# Patient Record
Sex: Male | Born: 1960 | Race: Black or African American | Hispanic: No | Marital: Single | Smoking: Former smoker
Health system: Southern US, Community
[De-identification: ages and names within clinical notes are randomized; demographics above are authoritative.]

## PROBLEM LIST (undated history)

## (undated) DIAGNOSIS — C801 Malignant (primary) neoplasm, unspecified: Secondary | ICD-10-CM

## (undated) DIAGNOSIS — I10 Essential (primary) hypertension: Secondary | ICD-10-CM

## (undated) DIAGNOSIS — E119 Type 2 diabetes mellitus without complications: Secondary | ICD-10-CM

## (undated) DIAGNOSIS — C787 Secondary malignant neoplasm of liver and intrahepatic bile duct: Principal | ICD-10-CM

## (undated) DIAGNOSIS — C189 Malignant neoplasm of colon, unspecified: Secondary | ICD-10-CM

## (undated) DIAGNOSIS — M6281 Muscle weakness (generalized): Secondary | ICD-10-CM

## (undated) HISTORY — DX: Muscle weakness (generalized): M62.81

## (undated) HISTORY — PX: CHOLECYSTECTOMY: SHX55

## (undated) HISTORY — DX: Secondary malignant neoplasm of liver and intrahepatic bile duct: C78.7

## (undated) HISTORY — PX: COLON SURGERY: SHX602

## (undated) HISTORY — PX: LIVER SURGERY: SHX698

## (undated) HISTORY — DX: Malignant neoplasm of colon, unspecified: C18.9

---

## 2000-01-02 ENCOUNTER — Inpatient Hospital Stay (HOSPITAL_COMMUNITY): Admission: RE | Admit: 2000-01-02 | Discharge: 2000-01-10 | Payer: Self-pay | Admitting: Surgery

## 2000-01-02 ENCOUNTER — Emergency Department (HOSPITAL_COMMUNITY): Admission: EM | Admit: 2000-01-02 | Discharge: 2000-01-02 | Payer: Self-pay | Admitting: Emergency Medicine

## 2000-01-04 ENCOUNTER — Encounter: Payer: Self-pay | Admitting: Surgery

## 2000-01-05 ENCOUNTER — Encounter: Payer: Self-pay | Admitting: Surgery

## 2000-01-09 ENCOUNTER — Encounter: Payer: Self-pay | Admitting: Surgery

## 2000-01-16 ENCOUNTER — Encounter: Payer: Self-pay | Admitting: Surgery

## 2000-01-16 ENCOUNTER — Ambulatory Visit (HOSPITAL_COMMUNITY): Admission: RE | Admit: 2000-01-16 | Discharge: 2000-01-16 | Payer: Self-pay | Admitting: Surgery

## 2000-01-30 ENCOUNTER — Ambulatory Visit (HOSPITAL_COMMUNITY): Admission: RE | Admit: 2000-01-30 | Discharge: 2000-01-30 | Payer: Self-pay | Admitting: Surgery

## 2000-01-30 ENCOUNTER — Encounter: Payer: Self-pay | Admitting: Surgery

## 2000-02-06 ENCOUNTER — Encounter: Payer: Self-pay | Admitting: Urology

## 2000-02-08 ENCOUNTER — Inpatient Hospital Stay (HOSPITAL_COMMUNITY): Admission: RE | Admit: 2000-02-08 | Discharge: 2000-02-23 | Payer: Self-pay | Admitting: Surgery

## 2000-02-08 ENCOUNTER — Encounter (INDEPENDENT_AMBULATORY_CARE_PROVIDER_SITE_OTHER): Payer: Self-pay | Admitting: Specialist

## 2000-02-08 ENCOUNTER — Encounter: Payer: Self-pay | Admitting: Surgery

## 2000-02-09 ENCOUNTER — Encounter: Payer: Self-pay | Admitting: Surgery

## 2000-02-13 ENCOUNTER — Encounter: Payer: Self-pay | Admitting: Surgery

## 2000-02-14 ENCOUNTER — Encounter: Payer: Self-pay | Admitting: Surgery

## 2000-02-15 ENCOUNTER — Encounter: Payer: Self-pay | Admitting: Surgery

## 2000-02-20 ENCOUNTER — Encounter: Payer: Self-pay | Admitting: Surgery

## 2000-02-22 ENCOUNTER — Encounter: Payer: Self-pay | Admitting: Oncology

## 2000-03-26 ENCOUNTER — Encounter (INDEPENDENT_AMBULATORY_CARE_PROVIDER_SITE_OTHER): Payer: Self-pay

## 2000-03-26 ENCOUNTER — Encounter: Payer: Self-pay | Admitting: Oncology

## 2000-03-26 ENCOUNTER — Ambulatory Visit (HOSPITAL_COMMUNITY): Admission: RE | Admit: 2000-03-26 | Discharge: 2000-03-26 | Payer: Self-pay | Admitting: Oncology

## 2000-05-25 ENCOUNTER — Ambulatory Visit (HOSPITAL_COMMUNITY): Admission: RE | Admit: 2000-05-25 | Discharge: 2000-05-25 | Payer: Self-pay | Admitting: Oncology

## 2000-07-19 ENCOUNTER — Encounter: Payer: Self-pay | Admitting: Oncology

## 2000-07-19 ENCOUNTER — Ambulatory Visit (HOSPITAL_COMMUNITY): Admission: RE | Admit: 2000-07-19 | Discharge: 2000-07-19 | Payer: Self-pay | Admitting: Oncology

## 2000-09-12 ENCOUNTER — Ambulatory Visit (HOSPITAL_COMMUNITY): Admission: RE | Admit: 2000-09-12 | Discharge: 2000-09-12 | Payer: Self-pay | Admitting: Oncology

## 2000-09-12 ENCOUNTER — Encounter: Payer: Self-pay | Admitting: Oncology

## 2000-09-24 ENCOUNTER — Encounter: Payer: Self-pay | Admitting: Oncology

## 2000-09-24 ENCOUNTER — Ambulatory Visit (HOSPITAL_COMMUNITY): Admission: RE | Admit: 2000-09-24 | Discharge: 2000-09-24 | Payer: Self-pay | Admitting: Oncology

## 2000-10-08 ENCOUNTER — Encounter: Payer: Self-pay | Admitting: Surgery

## 2000-10-08 ENCOUNTER — Ambulatory Visit (HOSPITAL_BASED_OUTPATIENT_CLINIC_OR_DEPARTMENT_OTHER): Admission: RE | Admit: 2000-10-08 | Discharge: 2000-10-08 | Payer: Self-pay | Admitting: Surgery

## 2000-10-25 ENCOUNTER — Ambulatory Visit (HOSPITAL_COMMUNITY): Admission: RE | Admit: 2000-10-25 | Discharge: 2000-10-25 | Payer: Self-pay | Admitting: Gastroenterology

## 2000-10-25 ENCOUNTER — Encounter (INDEPENDENT_AMBULATORY_CARE_PROVIDER_SITE_OTHER): Payer: Self-pay | Admitting: *Deleted

## 2000-11-26 ENCOUNTER — Ambulatory Visit (HOSPITAL_COMMUNITY): Admission: RE | Admit: 2000-11-26 | Discharge: 2000-11-26 | Payer: Self-pay | Admitting: Gastroenterology

## 2000-11-26 ENCOUNTER — Encounter (INDEPENDENT_AMBULATORY_CARE_PROVIDER_SITE_OTHER): Payer: Self-pay | Admitting: *Deleted

## 2001-01-29 ENCOUNTER — Encounter: Payer: Self-pay | Admitting: Oncology

## 2001-01-29 ENCOUNTER — Ambulatory Visit (HOSPITAL_COMMUNITY): Admission: RE | Admit: 2001-01-29 | Discharge: 2001-01-29 | Payer: Self-pay | Admitting: Oncology

## 2001-04-08 ENCOUNTER — Ambulatory Visit (HOSPITAL_COMMUNITY): Admission: RE | Admit: 2001-04-08 | Discharge: 2001-04-08 | Payer: Self-pay | Admitting: Oncology

## 2001-04-08 ENCOUNTER — Encounter: Payer: Self-pay | Admitting: Oncology

## 2001-05-07 ENCOUNTER — Encounter: Payer: Self-pay | Admitting: Oncology

## 2001-05-07 ENCOUNTER — Ambulatory Visit (HOSPITAL_COMMUNITY): Admission: RE | Admit: 2001-05-07 | Discharge: 2001-05-07 | Payer: Self-pay | Admitting: Oncology

## 2001-09-03 ENCOUNTER — Ambulatory Visit (HOSPITAL_COMMUNITY): Admission: RE | Admit: 2001-09-03 | Discharge: 2001-09-03 | Payer: Self-pay | Admitting: Oncology

## 2001-09-03 ENCOUNTER — Encounter: Payer: Self-pay | Admitting: Oncology

## 2002-02-20 ENCOUNTER — Ambulatory Visit (HOSPITAL_COMMUNITY): Admission: RE | Admit: 2002-02-20 | Discharge: 2002-02-20 | Payer: Self-pay | Admitting: Oncology

## 2002-02-20 ENCOUNTER — Encounter: Payer: Self-pay | Admitting: Oncology

## 2002-06-10 ENCOUNTER — Encounter: Payer: Self-pay | Admitting: Oncology

## 2002-06-10 ENCOUNTER — Ambulatory Visit (HOSPITAL_COMMUNITY): Admission: RE | Admit: 2002-06-10 | Discharge: 2002-06-10 | Payer: Self-pay | Admitting: Oncology

## 2002-09-30 ENCOUNTER — Ambulatory Visit (HOSPITAL_COMMUNITY): Admission: RE | Admit: 2002-09-30 | Discharge: 2002-09-30 | Payer: Self-pay | Admitting: Oncology

## 2002-09-30 ENCOUNTER — Encounter: Payer: Self-pay | Admitting: Oncology

## 2003-05-19 ENCOUNTER — Ambulatory Visit (HOSPITAL_COMMUNITY): Admission: RE | Admit: 2003-05-19 | Discharge: 2003-05-19 | Payer: Self-pay | Admitting: Oncology

## 2003-08-31 ENCOUNTER — Encounter (INDEPENDENT_AMBULATORY_CARE_PROVIDER_SITE_OTHER): Payer: Self-pay | Admitting: *Deleted

## 2003-08-31 ENCOUNTER — Ambulatory Visit (HOSPITAL_COMMUNITY): Admission: RE | Admit: 2003-08-31 | Discharge: 2003-08-31 | Payer: Self-pay | Admitting: Gastroenterology

## 2004-04-19 ENCOUNTER — Ambulatory Visit: Payer: Self-pay | Admitting: Oncology

## 2004-04-20 ENCOUNTER — Ambulatory Visit (HOSPITAL_COMMUNITY): Admission: RE | Admit: 2004-04-20 | Discharge: 2004-04-20 | Payer: Self-pay | Admitting: Oncology

## 2004-07-01 ENCOUNTER — Ambulatory Visit: Payer: Self-pay | Admitting: Oncology

## 2004-08-22 ENCOUNTER — Ambulatory Visit: Payer: Self-pay | Admitting: Oncology

## 2004-10-04 ENCOUNTER — Ambulatory Visit (HOSPITAL_COMMUNITY): Admission: RE | Admit: 2004-10-04 | Discharge: 2004-10-04 | Payer: Self-pay | Admitting: Oncology

## 2004-10-24 ENCOUNTER — Ambulatory Visit: Payer: Self-pay | Admitting: Oncology

## 2004-12-15 ENCOUNTER — Ambulatory Visit: Payer: Self-pay | Admitting: Oncology

## 2004-12-28 ENCOUNTER — Ambulatory Visit (HOSPITAL_COMMUNITY): Admission: RE | Admit: 2004-12-28 | Discharge: 2004-12-28 | Payer: Self-pay | Admitting: Oncology

## 2005-01-30 ENCOUNTER — Ambulatory Visit: Payer: Self-pay | Admitting: Oncology

## 2005-05-01 ENCOUNTER — Ambulatory Visit: Payer: Self-pay | Admitting: Oncology

## 2005-05-09 ENCOUNTER — Ambulatory Visit (HOSPITAL_COMMUNITY): Admission: RE | Admit: 2005-05-09 | Discharge: 2005-05-09 | Payer: Self-pay | Admitting: Oncology

## 2005-05-25 ENCOUNTER — Ambulatory Visit (HOSPITAL_COMMUNITY): Admission: RE | Admit: 2005-05-25 | Discharge: 2005-05-25 | Payer: Self-pay | Admitting: Oncology

## 2005-06-22 ENCOUNTER — Ambulatory Visit: Payer: Self-pay | Admitting: Oncology

## 2005-08-04 LAB — CBC WITH DIFFERENTIAL/PLATELET
Eosinophils Absolute: 0.1 10*3/uL (ref 0.0–0.5)
HCT: 45.6 % (ref 38.7–49.9)
LYMPH%: 37.6 % (ref 14.0–48.0)
MONO#: 0.6 10*3/uL (ref 0.1–0.9)
NEUT#: 1.9 10*3/uL (ref 1.5–6.5)
NEUT%: 43.9 % (ref 40.0–75.0)
Platelets: 224 10*3/uL (ref 145–400)
WBC: 4.3 10*3/uL (ref 4.0–10.0)

## 2005-08-04 LAB — UA PROTEIN, DIPSTICK - CHCC: Protein, Urine: NEGATIVE mg/dL

## 2005-08-15 ENCOUNTER — Ambulatory Visit: Payer: Self-pay | Admitting: Oncology

## 2005-08-16 LAB — CBC WITH DIFFERENTIAL/PLATELET
BASO%: 0.7 % (ref 0.0–2.0)
EOS%: 2 % (ref 0.0–7.0)
MCH: 26.7 pg — ABNORMAL LOW (ref 28.0–33.4)
MCHC: 33 g/dL (ref 32.0–35.9)
RDW: 15 % — ABNORMAL HIGH (ref 11.2–14.6)
WBC: 5.4 10*3/uL (ref 4.0–10.0)
lymph#: 2.5 10*3/uL (ref 0.9–3.3)

## 2005-08-16 LAB — COMPREHENSIVE METABOLIC PANEL
AST: 19 U/L (ref 0–37)
Alkaline Phosphatase: 52 U/L (ref 39–117)
BUN: 13 mg/dL (ref 6–23)
Creatinine, Ser: 1.3 mg/dL (ref 0.4–1.5)

## 2005-09-15 LAB — CBC WITH DIFFERENTIAL/PLATELET
BASO%: 1.3 % (ref 0.0–2.0)
LYMPH%: 56.4 % — ABNORMAL HIGH (ref 14.0–48.0)
MCHC: 31.4 g/dL — ABNORMAL LOW (ref 32.0–35.9)
MCV: 83 fL (ref 81.6–98.0)
MONO#: 0.4 10*3/uL (ref 0.1–0.9)
MONO%: 7.2 % (ref 0.0–13.0)
Platelets: 250 10*3/uL (ref 145–400)
RBC: 5.39 10*6/uL (ref 4.20–5.71)
RDW: 13.2 % (ref 11.2–14.6)
WBC: 5 10*3/uL (ref 4.0–10.0)

## 2005-09-15 LAB — UA PROTEIN, DIPSTICK - CHCC: Protein, Urine: <30 mg/dL

## 2005-10-05 ENCOUNTER — Ambulatory Visit: Payer: Self-pay | Admitting: Oncology

## 2005-10-10 ENCOUNTER — Ambulatory Visit (HOSPITAL_COMMUNITY): Admission: RE | Admit: 2005-10-10 | Discharge: 2005-10-10 | Payer: Self-pay | Admitting: Oncology

## 2005-11-15 ENCOUNTER — Ambulatory Visit: Payer: Self-pay | Admitting: Oncology

## 2005-11-16 LAB — CBC WITH DIFFERENTIAL/PLATELET
Basophils Absolute: 0.1 10*3/uL (ref 0.0–0.1)
EOS%: 0.7 % (ref 0.0–7.0)
Eosinophils Absolute: 0 10*3/uL (ref 0.0–0.5)
HCT: 48.6 % (ref 38.7–49.9)
HGB: 15.2 g/dL (ref 13.0–17.1)
MCH: 26.1 pg — ABNORMAL LOW (ref 28.0–33.4)
MCV: 83.4 fL (ref 81.6–98.0)
NEUT#: 2.4 10*3/uL (ref 1.5–6.5)
NEUT%: 44.7 % (ref 40.0–75.0)
lymph#: 2.6 10*3/uL (ref 0.9–3.3)

## 2005-12-04 LAB — CBC WITH DIFFERENTIAL/PLATELET
Basophils Absolute: 0 10*3/uL (ref 0.0–0.1)
EOS%: 0.5 % (ref 0.0–7.0)
Eosinophils Absolute: 0 10*3/uL (ref 0.0–0.5)
HGB: 14.9 g/dL (ref 13.0–17.1)
LYMPH%: 38 % (ref 14.0–48.0)
MCH: 26.4 pg — ABNORMAL LOW (ref 28.0–33.4)
MCV: 83.3 fL (ref 81.6–98.0)
MONO%: 7 % (ref 0.0–13.0)
NEUT%: 53.7 % (ref 40.0–75.0)
Platelets: 240 10*3/uL (ref 145–400)
RDW: 13.6 % (ref 11.2–14.6)

## 2006-01-02 ENCOUNTER — Ambulatory Visit: Payer: Self-pay | Admitting: Oncology

## 2006-01-02 LAB — CBC WITH DIFFERENTIAL/PLATELET
BASO%: 1.4 % (ref 0.0–2.0)
Basophils Absolute: 0.1 10*3/uL (ref 0.0–0.1)
EOS%: 1.3 % (ref 0.0–7.0)
HCT: 42.7 % (ref 38.7–49.9)
HGB: 14.1 g/dL (ref 13.0–17.1)
LYMPH%: 38.5 % (ref 14.0–48.0)
MCH: 27 pg — ABNORMAL LOW (ref 28.0–33.4)
MCHC: 33.1 g/dL (ref 32.0–35.9)
MCV: 81.5 fL — ABNORMAL LOW (ref 81.6–98.0)
MONO%: 6.3 % (ref 0.0–13.0)
NEUT%: 52.5 % (ref 40.0–75.0)
Platelets: 264 10*3/uL (ref 145–400)

## 2006-01-02 LAB — COMPREHENSIVE METABOLIC PANEL
Albumin: 4.4 g/dL (ref 3.5–5.2)
Alkaline Phosphatase: 46 U/L (ref 39–117)
Calcium: 10 mg/dL (ref 8.4–10.5)
Chloride: 105 mEq/L (ref 96–112)
Glucose, Bld: 81 mg/dL (ref 70–99)
Potassium: 4.1 mEq/L (ref 3.5–5.3)
Sodium: 143 mEq/L (ref 135–145)
Total Protein: 7.6 g/dL (ref 6.0–8.3)

## 2006-01-02 LAB — UA PROTEIN, DIPSTICK - CHCC: Protein, Urine: NEGATIVE mg/dL

## 2006-01-16 LAB — CBC WITH DIFFERENTIAL/PLATELET
BASO%: 1.5 % (ref 0.0–2.0)
EOS%: 1.1 % (ref 0.0–7.0)
MCH: 26.1 pg — ABNORMAL LOW (ref 28.0–33.4)
MCHC: 32.3 g/dL (ref 32.0–35.9)
NEUT%: 46.2 % (ref 40.0–75.0)
RBC: 5.21 10*6/uL (ref 4.20–5.71)
RDW: 12.6 % (ref 11.2–14.6)
WBC: 6.3 10*3/uL (ref 4.0–10.0)
lymph#: 2.7 10*3/uL (ref 0.9–3.3)

## 2006-01-16 LAB — UA PROTEIN, DIPSTICK - CHCC: Protein, Urine: <30 mg/dL

## 2006-02-05 ENCOUNTER — Ambulatory Visit (HOSPITAL_COMMUNITY): Admission: RE | Admit: 2006-02-05 | Discharge: 2006-02-05 | Payer: Self-pay | Admitting: Oncology

## 2006-02-13 LAB — CBC WITH DIFFERENTIAL/PLATELET
BASO%: 0.9 % (ref 0.0–2.0)
LYMPH%: 48.1 % — ABNORMAL HIGH (ref 14.0–48.0)
MCHC: 32.7 g/dL (ref 32.0–35.9)
MCV: 80.5 fL — ABNORMAL LOW (ref 81.6–98.0)
MONO#: 0.4 10*3/uL (ref 0.1–0.9)
MONO%: 7.8 % (ref 0.0–13.0)
Platelets: 246 10*3/uL (ref 145–400)
RBC: 5.41 10*6/uL (ref 4.20–5.71)
RDW: 12.5 % (ref 11.2–14.6)
WBC: 5.4 10*3/uL (ref 4.0–10.0)

## 2006-02-13 LAB — UA PROTEIN, DIPSTICK - CHCC: Protein, Urine: 30 mg/dL

## 2006-02-23 ENCOUNTER — Ambulatory Visit: Payer: Self-pay | Admitting: Oncology

## 2006-02-27 LAB — CBC WITH DIFFERENTIAL/PLATELET
BASO%: 1.2 % (ref 0.0–2.0)
EOS%: 0.4 % (ref 0.0–7.0)
LYMPH%: 45.2 % (ref 14.0–48.0)
MCH: 25.7 pg — ABNORMAL LOW (ref 28.0–33.4)
MCHC: 31.1 g/dL — ABNORMAL LOW (ref 32.0–35.9)
MONO#: 0.4 10*3/uL (ref 0.1–0.9)
MONO%: 8.1 % (ref 0.0–13.0)
Platelets: 266 10*3/uL (ref 145–400)
RBC: 6.09 10*6/uL — ABNORMAL HIGH (ref 4.20–5.71)
WBC: 5.5 10*3/uL (ref 4.0–10.0)

## 2006-02-27 LAB — UA PROTEIN, DIPSTICK - CHCC: Protein, Urine: 100 mg/dL

## 2006-03-13 LAB — COMPREHENSIVE METABOLIC PANEL
AST: 18 U/L (ref 0–37)
Albumin: 4.2 g/dL (ref 3.5–5.2)
Alkaline Phosphatase: 41 U/L (ref 39–117)
Potassium: 4.1 mEq/L (ref 3.5–5.3)
Sodium: 137 mEq/L (ref 135–145)
Total Bilirubin: 0.8 mg/dL (ref 0.3–1.2)
Total Protein: 7.3 g/dL (ref 6.0–8.3)

## 2006-03-13 LAB — CBC WITH DIFFERENTIAL/PLATELET
BASO%: 0.8 % (ref 0.0–2.0)
EOS%: 1.3 % (ref 0.0–7.0)
LYMPH%: 50.6 % — ABNORMAL HIGH (ref 14.0–48.0)
MCH: 26.4 pg — ABNORMAL LOW (ref 28.0–33.4)
MCHC: 32.1 g/dL (ref 32.0–35.9)
MCV: 82.3 fL (ref 81.6–98.0)
MONO%: 6.7 % (ref 0.0–13.0)
NEUT#: 2 10*3/uL (ref 1.5–6.5)
RBC: 5.81 10*6/uL — ABNORMAL HIGH (ref 4.20–5.71)
RDW: 12.2 % (ref 11.2–14.6)

## 2006-03-13 LAB — LACTATE DEHYDROGENASE: LDH: 156 U/L (ref 94–250)

## 2006-03-14 ENCOUNTER — Ambulatory Visit (HOSPITAL_COMMUNITY): Admission: RE | Admit: 2006-03-14 | Discharge: 2006-03-14 | Payer: Self-pay | Admitting: Oncology

## 2006-04-04 ENCOUNTER — Ambulatory Visit: Payer: Self-pay | Admitting: Oncology

## 2006-04-11 LAB — CBC WITH DIFFERENTIAL/PLATELET
Basophils Absolute: 0 10*3/uL (ref 0.0–0.1)
EOS%: 1 % (ref 0.0–7.0)
HCT: 49.9 % (ref 38.7–49.9)
HGB: 15.9 g/dL (ref 13.0–17.1)
LYMPH%: 44.1 % (ref 14.0–48.0)
MCH: 26.1 pg — ABNORMAL LOW (ref 28.0–33.4)
MCV: 81.8 fL (ref 81.6–98.0)
NEUT%: 47.1 % (ref 40.0–75.0)
Platelets: 250 10*3/uL (ref 145–400)
lymph#: 3 10*3/uL (ref 0.9–3.3)

## 2006-04-24 LAB — UA PROTEIN, DIPSTICK - CHCC: Protein, Urine: 100 mg/dL

## 2006-04-30 LAB — COMPREHENSIVE METABOLIC PANEL
ALT: 9 U/L (ref 0–53)
AST: 9 U/L (ref 0–37)
Albumin: 4.3 g/dL (ref 3.5–5.2)
Alkaline Phosphatase: 45 U/L (ref 39–117)
Calcium: 9.4 mg/dL (ref 8.4–10.5)
Chloride: 104 mEq/L (ref 96–112)
Potassium: 3.9 mEq/L (ref 3.5–5.3)
Sodium: 139 mEq/L (ref 135–145)
Total Protein: 7.5 g/dL (ref 6.0–8.3)

## 2006-04-30 LAB — CBC WITH DIFFERENTIAL/PLATELET
BASO%: 0.9 % (ref 0.0–2.0)
Basophils Absolute: 0.1 10*3/uL (ref 0.0–0.1)
EOS%: 0.6 % (ref 0.0–7.0)
HGB: 15.5 g/dL (ref 13.0–17.1)
MCH: 25.9 pg — ABNORMAL LOW (ref 28.0–33.4)
MCV: 79.5 fL — ABNORMAL LOW (ref 81.6–98.0)
MONO%: 5.9 % (ref 0.0–13.0)
RBC: 5.98 10*6/uL — ABNORMAL HIGH (ref 4.20–5.71)
RDW: 11.8 % (ref 11.2–14.6)
lymph#: 3 10*3/uL (ref 0.9–3.3)

## 2006-05-01 ENCOUNTER — Ambulatory Visit (HOSPITAL_COMMUNITY): Admission: RE | Admit: 2006-05-01 | Discharge: 2006-05-01 | Payer: Self-pay | Admitting: Oncology

## 2006-05-07 LAB — CBC WITH DIFFERENTIAL/PLATELET
BASO%: 0.8 % (ref 0.0–2.0)
Eosinophils Absolute: 0.1 10*3/uL (ref 0.0–0.5)
MONO#: 0.4 10*3/uL (ref 0.1–0.9)
NEUT#: 3.5 10*3/uL (ref 1.5–6.5)
RBC: 5.85 10*6/uL — ABNORMAL HIGH (ref 4.20–5.71)
RDW: 11.9 % (ref 11.2–14.6)
WBC: 6.7 10*3/uL (ref 4.0–10.0)
lymph#: 2.6 10*3/uL (ref 0.9–3.3)

## 2006-05-07 LAB — UA PROTEIN, DIPSTICK - CHCC: Protein, Urine: 30 mg/dL

## 2006-05-17 ENCOUNTER — Ambulatory Visit: Payer: Self-pay | Admitting: Oncology

## 2006-06-26 LAB — COMPREHENSIVE METABOLIC PANEL
ALT: 14 U/L (ref 0–53)
AST: 18 U/L (ref 0–37)
Albumin: 4.2 g/dL (ref 3.5–5.2)
CO2: 27 mEq/L (ref 19–32)
Calcium: 9.9 mg/dL (ref 8.4–10.5)
Chloride: 104 mEq/L (ref 96–112)
Creatinine, Ser: 1.08 mg/dL (ref 0.40–1.50)
Potassium: 4.4 mEq/L (ref 3.5–5.3)

## 2006-06-26 LAB — CBC WITH DIFFERENTIAL/PLATELET
BASO%: 0.7 % (ref 0.0–2.0)
Basophils Absolute: 0 10*3/uL (ref 0.0–0.1)
HCT: 44.8 % (ref 38.7–49.9)
HGB: 14.6 g/dL (ref 13.0–17.1)
MONO#: 0.5 10*3/uL (ref 0.1–0.9)
NEUT%: 34.8 % — ABNORMAL LOW (ref 40.0–75.0)
RDW: 14 % (ref 11.2–14.6)
WBC: 6 10*3/uL (ref 4.0–10.0)
lymph#: 3.3 10*3/uL (ref 0.9–3.3)

## 2006-06-26 LAB — LACTATE DEHYDROGENASE: LDH: 161 U/L (ref 94–250)

## 2006-06-28 ENCOUNTER — Ambulatory Visit: Payer: Self-pay | Admitting: Oncology

## 2006-07-19 ENCOUNTER — Emergency Department (HOSPITAL_COMMUNITY): Admission: EM | Admit: 2006-07-19 | Discharge: 2006-07-19 | Payer: Self-pay | Admitting: Emergency Medicine

## 2006-07-20 LAB — CBC WITH DIFFERENTIAL/PLATELET
BASO%: 0.9 % (ref 0.0–2.0)
Basophils Absolute: 0.1 10*3/uL (ref 0.0–0.1)
EOS%: 0.3 % (ref 0.0–7.0)
HCT: 47 % (ref 38.7–49.9)
HGB: 15 g/dL (ref 13.0–17.1)
MCH: 26.1 pg — ABNORMAL LOW (ref 28.0–33.4)
MCHC: 31.9 g/dL — ABNORMAL LOW (ref 32.0–35.9)
MCV: 81.8 fL (ref 81.6–98.0)
MONO%: 5.7 % (ref 0.0–13.0)
NEUT%: 53.4 % (ref 40.0–75.0)
lymph#: 2.2 10*3/uL (ref 0.9–3.3)

## 2006-07-30 LAB — CBC WITH DIFFERENTIAL/PLATELET
Basophils Absolute: 0 10*3/uL (ref 0.0–0.1)
LYMPH%: 38.3 % (ref 14.0–48.0)
MCH: 27 pg — ABNORMAL LOW (ref 28.0–33.4)
MCHC: 33.5 g/dL (ref 32.0–35.9)
MCV: 80.5 fL — ABNORMAL LOW (ref 81.6–98.0)
NEUT%: 52.2 % (ref 40.0–75.0)
RBC: 5.23 10*6/uL (ref 4.20–5.71)
RDW: 14.1 % (ref 11.2–14.6)
lymph#: 2.7 10*3/uL (ref 0.9–3.3)

## 2006-08-13 ENCOUNTER — Ambulatory Visit: Payer: Self-pay | Admitting: Oncology

## 2006-08-13 LAB — CBC WITH DIFFERENTIAL/PLATELET
BASO%: 0.4 % (ref 0.0–2.0)
Basophils Absolute: 0 10*3/uL (ref 0.0–0.1)
EOS%: 1.5 % (ref 0.0–7.0)
HCT: 43.1 % (ref 38.7–49.9)
LYMPH%: 46 % (ref 14.0–48.0)
MCH: 26.9 pg — ABNORMAL LOW (ref 28.0–33.4)
MCHC: 33.3 g/dL (ref 32.0–35.9)
NEUT%: 45.6 % (ref 40.0–75.0)
Platelets: 222 10*3/uL (ref 145–400)

## 2006-08-29 ENCOUNTER — Ambulatory Visit (HOSPITAL_COMMUNITY): Admission: RE | Admit: 2006-08-29 | Discharge: 2006-08-29 | Payer: Self-pay | Admitting: Oncology

## 2006-10-17 ENCOUNTER — Ambulatory Visit: Payer: Self-pay | Admitting: Oncology

## 2006-10-30 LAB — CBC WITH DIFFERENTIAL/PLATELET
BASO%: 0.4 % (ref 0.0–2.0)
EOS%: 1.1 % (ref 0.0–7.0)
HCT: 42.3 % (ref 38.7–49.9)
MCH: 26.9 pg — ABNORMAL LOW (ref 28.0–33.4)
MCHC: 33.4 g/dL (ref 32.0–35.9)
MCV: 80.5 fL — ABNORMAL LOW (ref 81.6–98.0)
MONO%: 6.6 % (ref 0.0–13.0)
NEUT%: 53.3 % (ref 40.0–75.0)
lymph#: 2.8 10*3/uL (ref 0.9–3.3)

## 2006-10-30 LAB — COMPREHENSIVE METABOLIC PANEL
ALT: 18 U/L (ref 0–53)
AST: 18 U/L (ref 0–37)
Alkaline Phosphatase: 48 U/L (ref 39–117)
Calcium: 9.8 mg/dL (ref 8.4–10.5)
Chloride: 108 mEq/L (ref 96–112)
Creatinine, Ser: 1.21 mg/dL (ref 0.40–1.50)
Total Bilirubin: 0.6 mg/dL (ref 0.3–1.2)

## 2006-10-30 LAB — CEA: CEA: 4.1 ng/mL (ref 0.0–5.0)

## 2006-12-13 ENCOUNTER — Ambulatory Visit: Payer: Self-pay | Admitting: Oncology

## 2007-01-11 ENCOUNTER — Ambulatory Visit (HOSPITAL_COMMUNITY): Admission: RE | Admit: 2007-01-11 | Discharge: 2007-01-11 | Payer: Self-pay | Admitting: Oncology

## 2007-01-11 LAB — CBC WITH DIFFERENTIAL/PLATELET
Basophils Absolute: 0 10*3/uL (ref 0.0–0.1)
EOS%: 1.4 % (ref 0.0–7.0)
HCT: 46.7 % (ref 38.7–49.9)
HGB: 15.8 g/dL (ref 13.0–17.1)
MCH: 27 pg — ABNORMAL LOW (ref 28.0–33.4)
MCV: 79.8 fL — ABNORMAL LOW (ref 81.6–98.0)
MONO%: 6.9 % (ref 0.0–13.0)
NEUT%: 46.7 % (ref 40.0–75.0)
RDW: 13.9 % (ref 11.2–14.6)

## 2007-01-11 LAB — COMPREHENSIVE METABOLIC PANEL
Alkaline Phosphatase: 53 U/L (ref 39–117)
BUN: 12 mg/dL (ref 6–23)
Glucose, Bld: 100 mg/dL — ABNORMAL HIGH (ref 70–99)
Total Bilirubin: 1.6 mg/dL — ABNORMAL HIGH (ref 0.3–1.2)

## 2007-01-31 ENCOUNTER — Ambulatory Visit: Payer: Self-pay | Admitting: Oncology

## 2007-02-22 LAB — CBC WITH DIFFERENTIAL/PLATELET
Basophils Absolute: 0.1 10*3/uL (ref 0.0–0.1)
EOS%: 1.3 % (ref 0.0–7.0)
Eosinophils Absolute: 0.1 10*3/uL (ref 0.0–0.5)
HGB: 16 g/dL (ref 13.0–17.1)
LYMPH%: 43.8 % (ref 14.0–48.0)
MCH: 27 pg — ABNORMAL LOW (ref 28.0–33.4)
MCV: 80.1 fL — ABNORMAL LOW (ref 81.6–98.0)
MONO%: 7.2 % (ref 0.0–13.0)
NEUT#: 3 10*3/uL (ref 1.5–6.5)
Platelets: 252 10*3/uL (ref 145–400)
RBC: 5.94 10*6/uL — ABNORMAL HIGH (ref 4.20–5.71)
RDW: 13.4 % (ref 11.2–14.6)

## 2007-02-22 LAB — COMPREHENSIVE METABOLIC PANEL
ALT: 17 U/L (ref 0–53)
AST: 18 U/L (ref 0–37)
Albumin: 3.9 g/dL (ref 3.5–5.2)
Alkaline Phosphatase: 54 U/L (ref 39–117)
BUN: 13 mg/dL (ref 6–23)
CO2: 26 mEq/L (ref 19–32)
Calcium: 9.3 mg/dL (ref 8.4–10.5)
Chloride: 105 mEq/L (ref 96–112)
Creatinine, Ser: 1.1 mg/dL (ref 0.40–1.50)
Glucose, Bld: 106 mg/dL — ABNORMAL HIGH (ref 70–99)
Potassium: 4.3 mEq/L (ref 3.5–5.3)
Sodium: 138 mEq/L (ref 135–145)
Total Bilirubin: 1.6 mg/dL — ABNORMAL HIGH (ref 0.3–1.2)
Total Protein: 8 g/dL (ref 6.0–8.3)

## 2007-02-22 LAB — CEA: CEA: 2 ng/mL (ref 0.0–5.0)

## 2007-02-22 LAB — PSA: PSA: 0.69 ng/mL (ref 0.10–4.00)

## 2007-02-22 LAB — LACTATE DEHYDROGENASE: LDH: 137 U/L (ref 94–250)

## 2007-03-20 ENCOUNTER — Ambulatory Visit: Payer: Self-pay | Admitting: Oncology

## 2007-03-26 LAB — CBC WITH DIFFERENTIAL/PLATELET
BASO%: 1.6 % (ref 0.0–2.0)
EOS%: 2 % (ref 0.0–7.0)
HCT: 45.2 % (ref 38.7–49.9)
MCH: 26.2 pg — ABNORMAL LOW (ref 28.0–33.4)
MCHC: 32.9 g/dL (ref 32.0–35.9)
MONO#: 0.6 10*3/uL (ref 0.1–0.9)
NEUT%: 49.9 % (ref 40.0–75.0)
RBC: 5.67 10*6/uL (ref 4.20–5.71)
WBC: 6.7 10*3/uL (ref 4.0–10.0)
lymph#: 2.5 10*3/uL (ref 0.9–3.3)

## 2007-04-08 LAB — CBC WITH DIFFERENTIAL/PLATELET
BASO%: 0.6 % (ref 0.0–2.0)
Basophils Absolute: 0 10*3/uL (ref 0.0–0.1)
EOS%: 2.3 % (ref 0.0–7.0)
HGB: 15.4 g/dL (ref 13.0–17.1)
MCH: 26.6 pg — ABNORMAL LOW (ref 28.0–33.4)
MCHC: 33.1 g/dL (ref 32.0–35.9)
MONO#: 0.3 10*3/uL (ref 0.1–0.9)
RDW: 13.6 % (ref 11.2–14.6)
WBC: 5 10*3/uL (ref 4.0–10.0)
lymph#: 2.1 10*3/uL (ref 0.9–3.3)

## 2007-05-03 LAB — CBC WITH DIFFERENTIAL/PLATELET
Basophils Absolute: 0 10*3/uL (ref 0.0–0.1)
Eosinophils Absolute: 0.1 10*3/uL (ref 0.0–0.5)
HCT: 48.6 % (ref 38.7–49.9)
HGB: 15.7 g/dL (ref 13.0–17.1)
NEUT#: 2.4 10*3/uL (ref 1.5–6.5)
RDW: 14.1 % (ref 11.2–14.6)
lymph#: 2.8 10*3/uL (ref 0.9–3.3)

## 2007-05-15 ENCOUNTER — Ambulatory Visit: Payer: Self-pay | Admitting: Oncology

## 2007-05-20 LAB — CBC WITH DIFFERENTIAL/PLATELET
BASO%: 0.4 % (ref 0.0–2.0)
Eosinophils Absolute: 0.1 10*3/uL (ref 0.0–0.5)
MCHC: 32.1 g/dL (ref 32.0–35.9)
MONO#: 0.4 10*3/uL (ref 0.1–0.9)
NEUT#: 2.6 10*3/uL (ref 1.5–6.5)
RBC: 6.13 10*6/uL — ABNORMAL HIGH (ref 4.20–5.71)
WBC: 5.8 10*3/uL (ref 4.0–10.0)
lymph#: 2.6 10*3/uL (ref 0.9–3.3)

## 2007-06-05 LAB — CBC WITH DIFFERENTIAL/PLATELET
BASO%: 1.4 % (ref 0.0–2.0)
Basophils Absolute: 0.1 10*3/uL (ref 0.0–0.1)
Eosinophils Absolute: 0.2 10*3/uL (ref 0.0–0.5)
HCT: 51.2 % — ABNORMAL HIGH (ref 38.7–49.9)
HGB: 16.3 g/dL (ref 13.0–17.1)
MONO#: 0.4 10*3/uL (ref 0.1–0.9)
NEUT#: 2 10*3/uL (ref 1.5–6.5)
NEUT%: 35.4 % — ABNORMAL LOW (ref 40.0–75.0)
Platelets: 269 10*3/uL (ref 145–400)
WBC: 5.6 10*3/uL (ref 4.0–10.0)
lymph#: 3 10*3/uL (ref 0.9–3.3)

## 2007-06-21 LAB — CBC WITH DIFFERENTIAL/PLATELET
Basophils Absolute: 0 10*3/uL (ref 0.0–0.1)
Eosinophils Absolute: 0.1 10*3/uL (ref 0.0–0.5)
HGB: 15.1 g/dL (ref 13.0–17.1)
NEUT#: 2.3 10*3/uL (ref 1.5–6.5)
RDW: 13.4 % (ref 11.2–14.6)
WBC: 5 10*3/uL (ref 4.0–10.0)
lymph#: 2.3 10*3/uL (ref 0.9–3.3)

## 2007-06-25 ENCOUNTER — Ambulatory Visit (HOSPITAL_COMMUNITY): Admission: RE | Admit: 2007-06-25 | Discharge: 2007-06-25 | Payer: Self-pay | Admitting: Oncology

## 2007-06-26 ENCOUNTER — Ambulatory Visit: Payer: Self-pay | Admitting: Oncology

## 2007-07-01 LAB — COMPREHENSIVE METABOLIC PANEL
Albumin: 4.2 g/dL (ref 3.5–5.2)
Alkaline Phosphatase: 60 U/L (ref 39–117)
BUN: 12 mg/dL (ref 6–23)
CO2: 24 mEq/L (ref 19–32)
Calcium: 9.4 mg/dL (ref 8.4–10.5)
Glucose, Bld: 113 mg/dL — ABNORMAL HIGH (ref 70–99)
Potassium: 4.5 mEq/L (ref 3.5–5.3)
Total Protein: 7.5 g/dL (ref 6.0–8.3)

## 2007-07-01 LAB — CBC WITH DIFFERENTIAL/PLATELET
Basophils Absolute: 0 10*3/uL (ref 0.0–0.1)
Eosinophils Absolute: 0.1 10*3/uL (ref 0.0–0.5)
HGB: 15.8 g/dL (ref 13.0–17.1)
MCV: 80.7 fL — ABNORMAL LOW (ref 81.6–98.0)
MONO#: 0.3 10*3/uL (ref 0.1–0.9)
MONO%: 5.3 % (ref 0.0–13.0)
NEUT#: 2.6 10*3/uL (ref 1.5–6.5)
Platelets: 239 10*3/uL (ref 145–400)
RDW: 13.9 % (ref 11.2–14.6)
WBC: 6.1 10*3/uL (ref 4.0–10.0)

## 2007-07-01 LAB — LACTATE DEHYDROGENASE: LDH: 134 U/L (ref 94–250)

## 2007-07-01 LAB — CEA: CEA: 1.8 ng/mL (ref 0.0–5.0)

## 2007-07-15 LAB — CBC WITH DIFFERENTIAL/PLATELET
BASO%: 1.2 % (ref 0.0–2.0)
Eosinophils Absolute: 0 10*3/uL (ref 0.0–0.5)
LYMPH%: 43.9 % (ref 14.0–48.0)
MCHC: 33.1 g/dL (ref 32.0–35.9)
MCV: 80.2 fL — ABNORMAL LOW (ref 81.6–98.0)
MONO#: 0.4 10*3/uL (ref 0.1–0.9)
MONO%: 5.6 % (ref 0.0–13.0)
NEUT#: 3.4 10*3/uL (ref 1.5–6.5)
Platelets: 240 10*3/uL (ref 145–400)
RBC: 5.97 10*6/uL — ABNORMAL HIGH (ref 4.20–5.71)
RDW: 13.4 % (ref 11.2–14.6)
WBC: 7.1 10*3/uL (ref 4.0–10.0)

## 2007-08-12 ENCOUNTER — Ambulatory Visit: Payer: Self-pay | Admitting: Oncology

## 2007-08-12 ENCOUNTER — Ambulatory Visit (HOSPITAL_COMMUNITY): Admission: RE | Admit: 2007-08-12 | Discharge: 2007-08-12 | Payer: Self-pay | Admitting: Oncology

## 2007-08-12 LAB — COMPREHENSIVE METABOLIC PANEL
ALT: 11 U/L (ref 0–53)
AST: 10 U/L (ref 0–37)
Albumin: 4.1 g/dL (ref 3.5–5.2)
Alkaline Phosphatase: 55 U/L (ref 39–117)
Calcium: 9.5 mg/dL (ref 8.4–10.5)
Chloride: 106 mEq/L (ref 96–112)
Creatinine, Ser: 1.24 mg/dL (ref 0.40–1.50)
Potassium: 4.7 mEq/L (ref 3.5–5.3)

## 2007-08-12 LAB — CEA: CEA: 1.7 ng/mL (ref 0.0–5.0)

## 2007-09-24 ENCOUNTER — Ambulatory Visit: Payer: Self-pay | Admitting: Oncology

## 2007-09-27 ENCOUNTER — Ambulatory Visit (HOSPITAL_COMMUNITY): Admission: RE | Admit: 2007-09-27 | Discharge: 2007-09-27 | Payer: Self-pay | Admitting: Oncology

## 2007-10-04 ENCOUNTER — Encounter: Payer: Self-pay | Admitting: Adult Health

## 2007-10-04 LAB — CBC WITH DIFFERENTIAL/PLATELET
Basophils Absolute: 0 10*3/uL (ref 0.0–0.1)
Eosinophils Absolute: 0.2 10*3/uL (ref 0.0–0.5)
HGB: 13.7 g/dL (ref 13.0–17.1)
MCV: 81.1 fL — ABNORMAL LOW (ref 81.6–98.0)
MONO#: 0.4 10*3/uL (ref 0.1–0.9)
NEUT#: 1.5 10*3/uL (ref 1.5–6.5)
RBC: 5.16 10*6/uL (ref 4.20–5.71)
RDW: 14.2 % (ref 11.2–14.6)
WBC: 4.5 10*3/uL (ref 4.0–10.0)
lymph#: 2.4 10*3/uL (ref 0.9–3.3)

## 2007-10-05 LAB — COMPREHENSIVE METABOLIC PANEL
CO2: 24 mEq/L (ref 19–32)
Calcium: 9.1 mg/dL (ref 8.4–10.5)
Chloride: 105 mEq/L (ref 96–112)
Creatinine, Ser: 1.1 mg/dL (ref 0.40–1.50)
Glucose, Bld: 93 mg/dL (ref 70–99)
Total Bilirubin: 1.2 mg/dL (ref 0.3–1.2)

## 2007-10-05 LAB — CEA: CEA: 3.9 ng/mL (ref 0.0–5.0)

## 2007-10-05 LAB — LACTATE DEHYDROGENASE: LDH: 159 U/L (ref 94–250)

## 2007-11-29 ENCOUNTER — Ambulatory Visit: Payer: Self-pay | Admitting: Emergency Medicine

## 2007-11-29 ENCOUNTER — Inpatient Hospital Stay (HOSPITAL_COMMUNITY): Admission: AC | Admit: 2007-11-29 | Discharge: 2007-12-03 | Payer: Self-pay

## 2007-12-11 ENCOUNTER — Ambulatory Visit: Payer: Self-pay | Admitting: Oncology

## 2007-12-17 ENCOUNTER — Ambulatory Visit: Payer: Self-pay | Admitting: Internal Medicine

## 2007-12-17 DIAGNOSIS — E872 Acidosis: Secondary | ICD-10-CM

## 2007-12-17 DIAGNOSIS — R579 Shock, unspecified: Secondary | ICD-10-CM | POA: Insufficient documentation

## 2007-12-17 DIAGNOSIS — I10 Essential (primary) hypertension: Secondary | ICD-10-CM

## 2007-12-17 DIAGNOSIS — J9600 Acute respiratory failure, unspecified whether with hypoxia or hypercapnia: Secondary | ICD-10-CM

## 2007-12-17 DIAGNOSIS — N259 Disorder resulting from impaired renal tubular function, unspecified: Secondary | ICD-10-CM | POA: Insufficient documentation

## 2007-12-17 DIAGNOSIS — C189 Malignant neoplasm of colon, unspecified: Secondary | ICD-10-CM | POA: Insufficient documentation

## 2007-12-17 DIAGNOSIS — R4182 Altered mental status, unspecified: Secondary | ICD-10-CM | POA: Insufficient documentation

## 2007-12-20 LAB — CONVERTED CEMR LAB
Albumin: 3.8 g/dL (ref 3.5–5.2)
BUN: 8 mg/dL (ref 6–23)
Creatinine, Ser: 1.1 mg/dL (ref 0.4–1.5)
GFR calc Af Amer: 92 mL/min
GFR calc non Af Amer: 76 mL/min
Total Bilirubin: 0.9 mg/dL (ref 0.3–1.2)

## 2007-12-26 ENCOUNTER — Ambulatory Visit: Payer: Self-pay | Admitting: Internal Medicine

## 2007-12-26 DIAGNOSIS — R7309 Other abnormal glucose: Secondary | ICD-10-CM | POA: Insufficient documentation

## 2007-12-26 DIAGNOSIS — F17200 Nicotine dependence, unspecified, uncomplicated: Secondary | ICD-10-CM | POA: Insufficient documentation

## 2007-12-26 DIAGNOSIS — Z9119 Patient's noncompliance with other medical treatment and regimen: Secondary | ICD-10-CM

## 2007-12-26 DIAGNOSIS — Z91199 Patient's noncompliance with other medical treatment and regimen due to unspecified reason: Secondary | ICD-10-CM | POA: Insufficient documentation

## 2008-01-10 ENCOUNTER — Ambulatory Visit: Payer: Self-pay | Admitting: Internal Medicine

## 2008-01-10 LAB — CONVERTED CEMR LAB
AST: 14 units/L (ref 0–37)
Albumin: 4.1 g/dL (ref 3.5–5.2)
Basophils Relative: 1.1 % (ref 0.0–3.0)
Chloride: 106 meq/L (ref 96–112)
Creatinine, Ser: 1.1 mg/dL (ref 0.4–1.5)
Crystals: NEGATIVE
Eosinophils Absolute: 0.1 10*3/uL (ref 0.0–0.7)
Eosinophils Relative: 2.7 % (ref 0.0–5.0)
GFR calc Af Amer: 92 mL/min
GFR calc non Af Amer: 76 mL/min
HCT: 45.9 % (ref 39.0–52.0)
Hemoglobin: 14.8 g/dL (ref 13.0–17.0)
Hgb A1c MFr Bld: 6.1 % — ABNORMAL HIGH (ref 4.6–6.0)
Leukocytes, UA: NEGATIVE
MCV: 82.4 fL (ref 78.0–100.0)
Monocytes Absolute: 0.3 10*3/uL (ref 0.1–1.0)
Monocytes Relative: 7.2 % (ref 3.0–12.0)
Mucus, UA: NEGATIVE
Neutro Abs: 1.6 10*3/uL (ref 1.4–7.7)
Nitrite: NEGATIVE
RBC: 5.57 M/uL (ref 4.22–5.81)
Total Bilirubin: 1 mg/dL (ref 0.3–1.2)
Urobilinogen, UA: 1 (ref 0.0–1.0)
WBC: 4.5 10*3/uL (ref 4.5–10.5)
pH: 7.5 (ref 5.0–8.0)

## 2008-01-11 ENCOUNTER — Encounter: Payer: Self-pay | Admitting: Internal Medicine

## 2008-01-20 ENCOUNTER — Telehealth: Payer: Self-pay | Admitting: Internal Medicine

## 2008-01-21 ENCOUNTER — Telehealth (INDEPENDENT_AMBULATORY_CARE_PROVIDER_SITE_OTHER): Payer: Self-pay | Admitting: *Deleted

## 2008-01-21 ENCOUNTER — Ambulatory Visit: Payer: Self-pay | Admitting: Internal Medicine

## 2008-01-21 ENCOUNTER — Telehealth: Payer: Self-pay | Admitting: Internal Medicine

## 2008-01-21 DIAGNOSIS — R109 Unspecified abdominal pain: Secondary | ICD-10-CM

## 2008-01-21 LAB — CONVERTED CEMR LAB
ALT: 15 units/L (ref 0–53)
AST: 15 units/L (ref 0–37)
Basophils Relative: 0.7 % (ref 0.0–3.0)
Bilirubin Urine: NEGATIVE
Bilirubin, Direct: 0.2 mg/dL (ref 0.0–0.3)
CO2: 29 meq/L (ref 19–32)
Calcium: 9.5 mg/dL (ref 8.4–10.5)
Chloride: 105 meq/L (ref 96–112)
Creatinine, Ser: 1.2 mg/dL (ref 0.4–1.5)
Eosinophils Relative: 1.1 % (ref 0.0–5.0)
Glucose, Bld: 104 mg/dL — ABNORMAL HIGH (ref 70–99)
HCT: 43.7 % (ref 39.0–52.0)
Hemoglobin, Urine: NEGATIVE
Hemoglobin: 14.6 g/dL (ref 13.0–17.0)
Ketones, ur: NEGATIVE mg/dL
Lymphocytes Relative: 55.2 % — ABNORMAL HIGH (ref 12.0–46.0)
Monocytes Absolute: 0.3 10*3/uL (ref 0.1–1.0)
Monocytes Relative: 7.7 % (ref 3.0–12.0)
Mucus, UA: NEGATIVE
Neutro Abs: 1.6 10*3/uL (ref 1.4–7.7)
Nitrite: NEGATIVE
RBC: 5.28 M/uL (ref 4.22–5.81)
RDW: 12.7 % (ref 11.5–14.6)
TSH: 1.1 microintl units/mL (ref 0.35–5.50)
Total Bilirubin: 1.1 mg/dL (ref 0.3–1.2)
Total Protein, Urine: NEGATIVE mg/dL
Total Protein: 7.5 g/dL (ref 6.0–8.3)
Urine Glucose: NEGATIVE mg/dL
WBC: 4.4 10*3/uL — ABNORMAL LOW (ref 4.5–10.5)
pH: 7 (ref 5.0–8.0)

## 2008-01-30 ENCOUNTER — Telehealth: Payer: Self-pay | Admitting: Internal Medicine

## 2008-02-03 ENCOUNTER — Telehealth: Payer: Self-pay | Admitting: Internal Medicine

## 2008-02-03 ENCOUNTER — Ambulatory Visit: Payer: Self-pay | Admitting: Internal Medicine

## 2008-02-06 ENCOUNTER — Ambulatory Visit: Payer: Self-pay | Admitting: Oncology

## 2008-02-07 ENCOUNTER — Ambulatory Visit: Payer: Self-pay | Admitting: Internal Medicine

## 2008-02-10 LAB — CBC WITH DIFFERENTIAL/PLATELET
BASO%: 0.6 % (ref 0.0–2.0)
Basophils Absolute: 0 10*3/uL (ref 0.0–0.1)
HCT: 44.6 % (ref 38.7–49.9)
HGB: 14.4 g/dL (ref 13.0–17.1)
LYMPH%: 46.9 % (ref 14.0–48.0)
MCH: 25.8 pg — ABNORMAL LOW (ref 28.0–33.4)
MCHC: 32.3 g/dL (ref 32.0–35.9)
MONO#: 0.3 10*3/uL (ref 0.1–0.9)
NEUT%: 45.1 % (ref 40.0–75.0)
Platelets: 253 10*3/uL (ref 145–400)

## 2008-02-10 LAB — COMPREHENSIVE METABOLIC PANEL
ALT: 18 U/L (ref 0–53)
BUN: 10 mg/dL (ref 6–23)
CO2: 31 mEq/L (ref 19–32)
Calcium: 9.1 mg/dL (ref 8.4–10.5)
Chloride: 100 mEq/L (ref 96–112)
Creatinine, Ser: 1.05 mg/dL (ref 0.40–1.50)
Total Bilirubin: 0.9 mg/dL (ref 0.3–1.2)

## 2008-02-10 LAB — LACTATE DEHYDROGENASE: LDH: 105 U/L (ref 94–250)

## 2008-02-10 LAB — CEA: CEA: 1.9 ng/mL (ref 0.0–5.0)

## 2008-02-14 ENCOUNTER — Encounter (INDEPENDENT_AMBULATORY_CARE_PROVIDER_SITE_OTHER): Payer: Self-pay | Admitting: Internal Medicine

## 2008-03-06 ENCOUNTER — Ambulatory Visit: Payer: Self-pay | Admitting: Internal Medicine

## 2008-04-06 ENCOUNTER — Telehealth: Payer: Self-pay | Admitting: Internal Medicine

## 2008-04-06 ENCOUNTER — Ambulatory Visit: Payer: Self-pay | Admitting: Internal Medicine

## 2008-05-14 ENCOUNTER — Telehealth (INDEPENDENT_AMBULATORY_CARE_PROVIDER_SITE_OTHER): Payer: Self-pay | Admitting: *Deleted

## 2008-06-03 ENCOUNTER — Ambulatory Visit: Payer: Self-pay | Admitting: Oncology

## 2008-06-09 ENCOUNTER — Ambulatory Visit (HOSPITAL_COMMUNITY): Admission: RE | Admit: 2008-06-09 | Discharge: 2008-06-09 | Payer: Self-pay | Admitting: Oncology

## 2008-06-09 LAB — CBC WITH DIFFERENTIAL/PLATELET
BASO%: 0.3 % (ref 0.0–2.0)
EOS%: 1.4 % (ref 0.0–7.0)
HGB: 15.9 g/dL (ref 13.0–17.1)
MCH: 26.2 pg — ABNORMAL LOW (ref 27.2–33.4)
MCHC: 32.6 g/dL (ref 32.0–36.0)
RBC: 6.08 10*6/uL — ABNORMAL HIGH (ref 4.20–5.82)
RDW: 13.4 % (ref 11.0–14.6)
lymph#: 2.9 10*3/uL (ref 0.9–3.3)
nRBC: 0 % (ref 0–0)

## 2008-06-09 LAB — COMPREHENSIVE METABOLIC PANEL
AST: 29 U/L (ref 0–37)
Albumin: 4 g/dL (ref 3.5–5.2)
Alkaline Phosphatase: 56 U/L (ref 39–117)
Potassium: 4 mEq/L (ref 3.5–5.3)
Sodium: 137 mEq/L (ref 135–145)
Total Bilirubin: 1.2 mg/dL (ref 0.3–1.2)
Total Protein: 7.6 g/dL (ref 6.0–8.3)

## 2008-06-19 ENCOUNTER — Telehealth (INDEPENDENT_AMBULATORY_CARE_PROVIDER_SITE_OTHER): Payer: Self-pay | Admitting: *Deleted

## 2008-07-08 ENCOUNTER — Telehealth: Payer: Self-pay | Admitting: Emergency Medicine

## 2008-07-13 IMAGING — CT CT CHEST W/ CM
2 of 5 series · 16 of 46 positions shown, 18 images · IV contrast (agent unspecified)
Comparison: 06/25/2007 and 01/11/2007

CT CHEST

CLINICAL DATA: METASTATIC COLON CANCER DIAGNOSED IN 7990.  COLON
RESECTION.  PARTIAL LIVER RESECTION.

CT CHEST, ABDOMEN AND PELVIS WITH CONTRAST
TECHNIQUE: Contiguous axial images of the chest abdomen and pelvis
were obtained after IV contrast administration.
Contrast: 125 ml [id number]-[id number]

[Series 2: cap 5.0 b40f · axial · 0.76mm/px · z∈[-695,-120]mm · 13 of 131 slices shown, 15 images]
[im 8/131  soft-tissue]
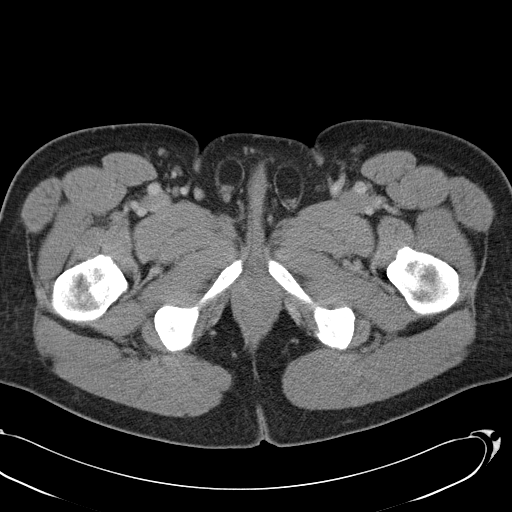
[im 8/131  bone]
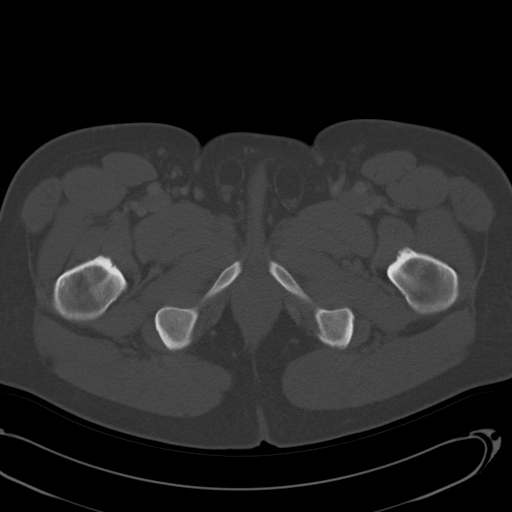
[im 15/131  soft-tissue]
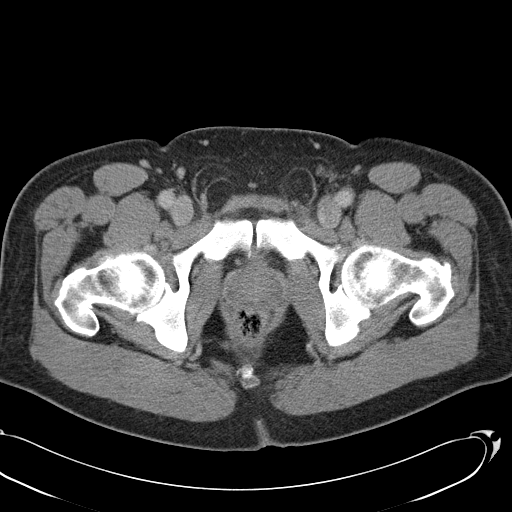
[im 29/131  soft-tissue]
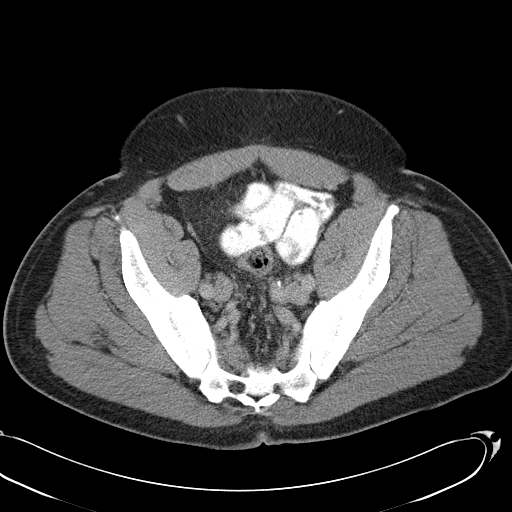
[im 37/131  soft-tissue]
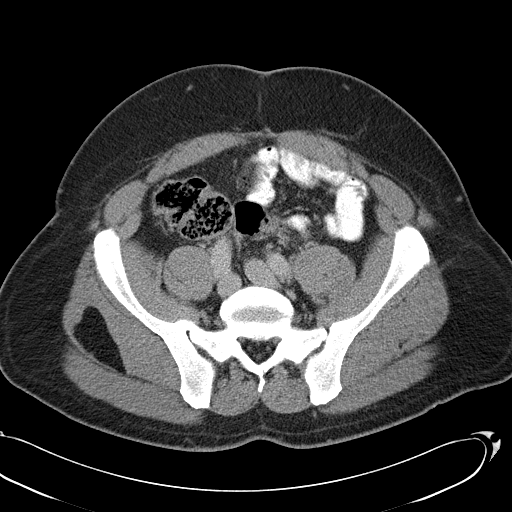
[im 44/131  soft-tissue]
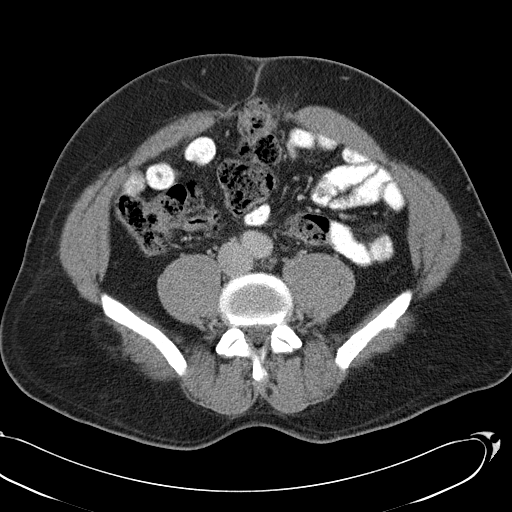
[im 58/131  soft-tissue]
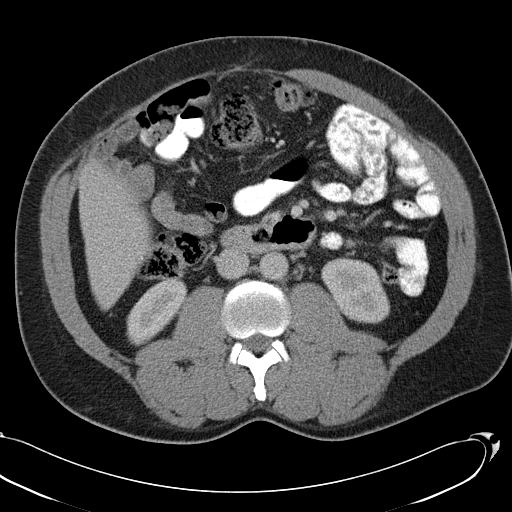
[im 66/131  soft-tissue]
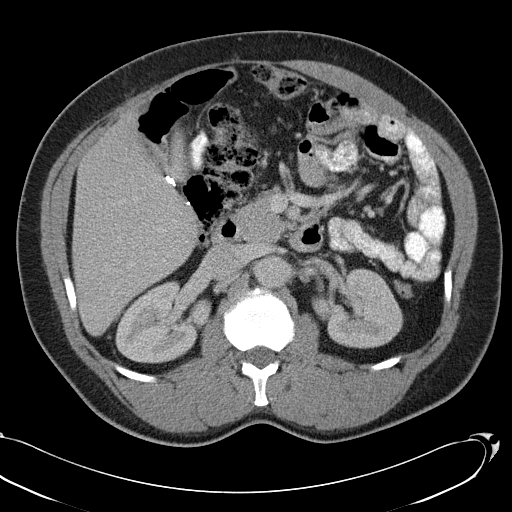
[im 73/131  soft-tissue]
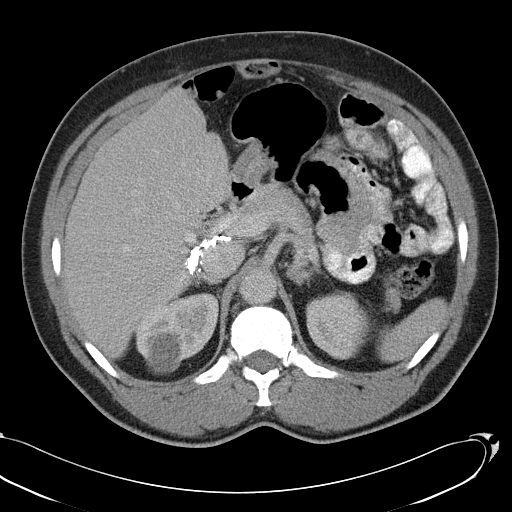
[im 87/131  soft-tissue]
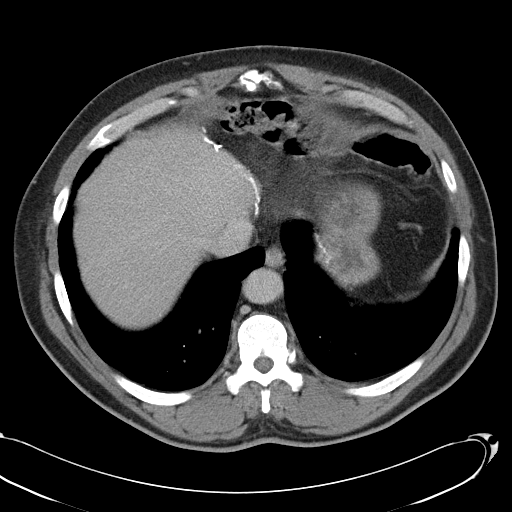
[im 87/131  bone]
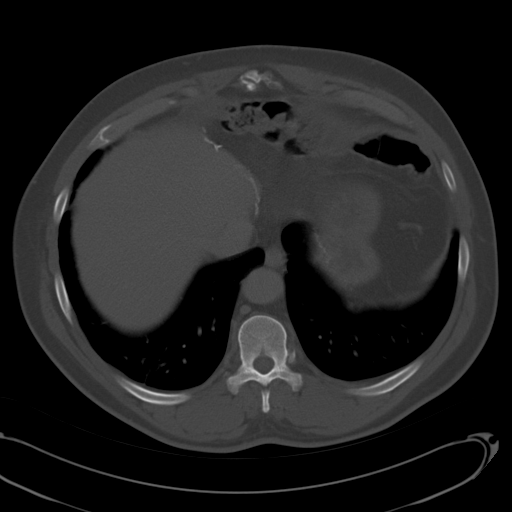
[im 94/131  soft-tissue]
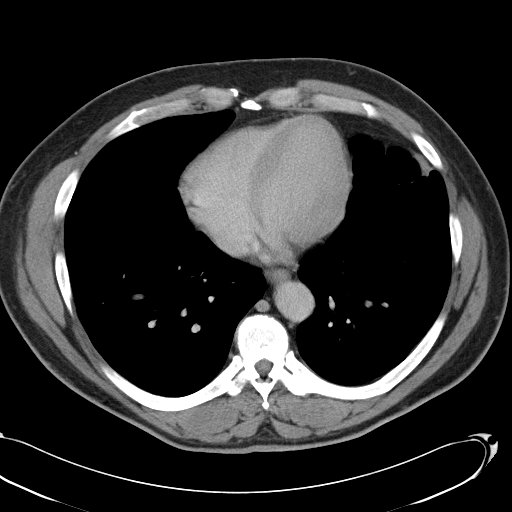
[im 102/131  soft-tissue]
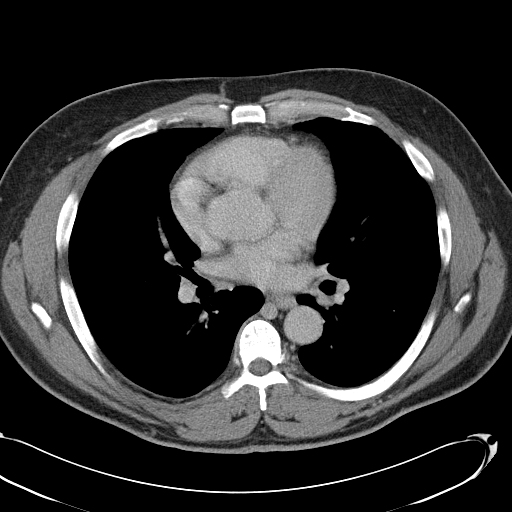
[im 116/131  soft-tissue]
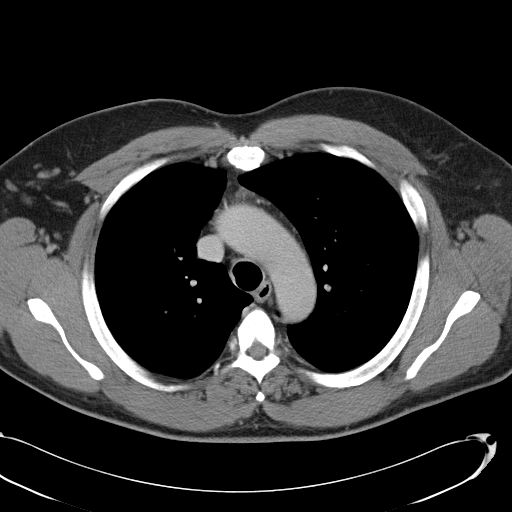
[im 123/131  soft-tissue]
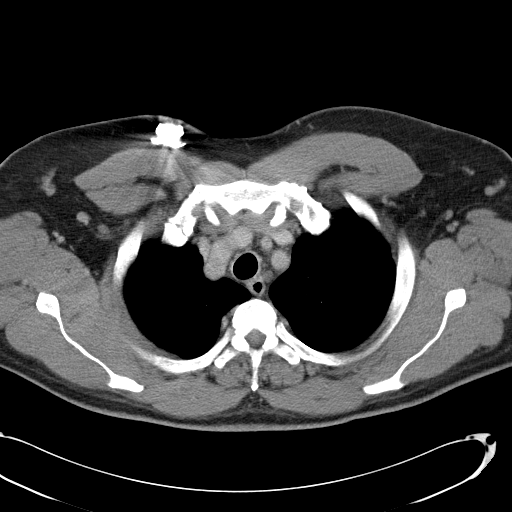

[Series 602: <mpr thick range> · coronal · 1.28mm/px · 3 of 93 slices shown]
[im 31/93  soft-tissue]
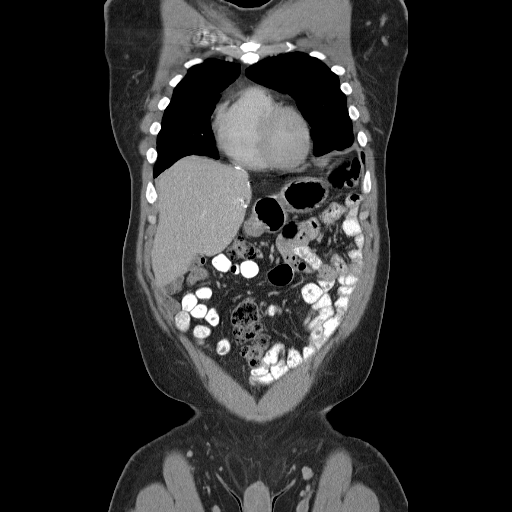
[im 41/93  soft-tissue]
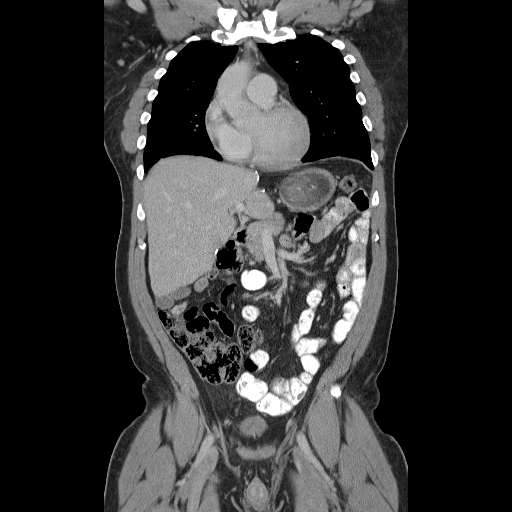
[im 52/93  soft-tissue]
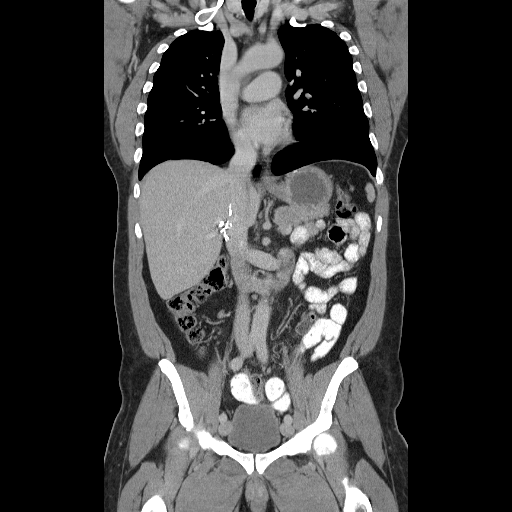

[16 of 46 positions shown; findings below may reference images not displayed]

FINDINGS: Lung windows demonstrate clustered micronodules in the
right lower lobe on image 43.
New since the prior exam.  Minimal subpleural irregularity at the
left lower lobe on image 39.  Likely similar on image 38 of the
prior.

Soft tissue windows demonstrate a right-sided Port-A-Cath port to
without catheter.  Heart size upper limits of normal.  No
pericardial or pleural effusion. No mediastinal or hilar
adenopathy.
IMPRESSION: 1.  No evidence of metastatic disease in the chest.
2.  New clustered right lower lobe nodules.  Question interval
infection.  Consider atypical etiologies.

CT ABDOMEN
FINDINGS: Status post partial left hepatectomy.  Suspect a too
small to characterize right hepatic lobe lesion on image 47.
Likely present on prior exams including back to 01/11/2007.  No
evidence of recurrent tumor at the resection site.  Normal spleen,
stomach, pancreas.  Cholecystectomy without biliary ductal
dilatation.  Normal adrenal glands.  right upper pole renal cyst.
Normal left kidney. No retroperitoneal or retrocrural adenopathy. A
left periumbilical ventral hernia contains nonobstructive
transverse colon.

Normal abdominal small bowel without ascites.
IMPRESSION: 1. No acute abdominal process or evidence of metastatic disease.
2.  Status post left hepatectomy.
3.  Suspect too small to characterize right hepatic lobe lesion,
similar back to 01/11/2007. Recommend attention on follow-up.
4.  Left paraumbilical hernia containing nonobstructive colon.

CT PELVIS
FINDINGS: Surgical sutures at the sigmoid colon. Normal pelvic
small bowel.  Minimal increased density in the right pelvic fat is
likely postoperative and is stable.  bilateral inguinal hernias
containing fat.  No pelvic adenopathy or ascites.  Normal urinary
bladder prostate.  Lipoma in the right gluteal musculature.  No
acute osseous abnormality.
IMPRESSION: No acute pelvic process or evidence of metastatic disease.

## 2008-09-30 ENCOUNTER — Ambulatory Visit: Payer: Self-pay | Admitting: Oncology

## 2008-10-08 LAB — COMPREHENSIVE METABOLIC PANEL WITH GFR
ALT: 25 U/L (ref 0–53)
AST: 24 U/L (ref 0–37)
Albumin: 3.9 g/dL (ref 3.5–5.2)
Alkaline Phosphatase: 51 U/L (ref 39–117)
BUN: 9 mg/dL (ref 6–23)
CO2: 28 meq/L (ref 19–32)
Calcium: 9.6 mg/dL (ref 8.4–10.5)
Chloride: 109 meq/L (ref 96–112)
Creatinine, Ser: 1.21 mg/dL (ref 0.40–1.50)
Glucose, Bld: 96 mg/dL (ref 70–99)
Potassium: 3.9 meq/L (ref 3.5–5.3)
Sodium: 145 meq/L (ref 135–145)
Total Bilirubin: 1 mg/dL (ref 0.3–1.2)
Total Protein: 7.5 g/dL (ref 6.0–8.3)

## 2008-10-08 LAB — CBC WITH DIFFERENTIAL/PLATELET
BASO%: 0.7 % (ref 0.0–2.0)
Basophils Absolute: 0 10*3/uL (ref 0.0–0.1)
EOS%: 2.2 % (ref 0.0–7.0)
Eosinophils Absolute: 0.1 10*3/uL (ref 0.0–0.5)
HCT: 44.9 % (ref 38.4–49.9)
HGB: 14.6 g/dL (ref 13.0–17.1)
LYMPH%: 49.4 % — ABNORMAL HIGH (ref 14.0–49.0)
MCH: 27.1 pg — ABNORMAL LOW (ref 27.2–33.4)
MCHC: 32.6 g/dL (ref 32.0–36.0)
MCV: 83.1 fL (ref 79.3–98.0)
MONO#: 0.6 10*3/uL (ref 0.1–0.9)
MONO%: 9.5 % (ref 0.0–14.0)
NEUT#: 2.3 10*3/uL (ref 1.5–6.5)
NEUT%: 38.2 % — ABNORMAL LOW (ref 39.0–75.0)
Platelets: 220 10*3/uL (ref 140–400)
RBC: 5.4 10*6/uL (ref 4.20–5.82)
RDW: 14.3 % (ref 11.0–14.6)
WBC: 6.1 10*3/uL (ref 4.0–10.3)
lymph#: 3 10*3/uL (ref 0.9–3.3)

## 2008-10-08 LAB — CEA: CEA: 2 ng/mL (ref 0.0–5.0)

## 2008-10-08 LAB — LACTATE DEHYDROGENASE: LDH: 146 U/L (ref 94–250)

## 2008-12-03 ENCOUNTER — Ambulatory Visit: Payer: Self-pay | Admitting: Oncology

## 2008-12-11 ENCOUNTER — Ambulatory Visit (HOSPITAL_COMMUNITY): Admission: RE | Admit: 2008-12-11 | Discharge: 2008-12-11 | Payer: Self-pay | Admitting: Oncology

## 2008-12-14 LAB — COMPREHENSIVE METABOLIC PANEL
AST: 22 U/L (ref 0–37)
Alkaline Phosphatase: 49 U/L (ref 39–117)
BUN: 12 mg/dL (ref 6–23)
Calcium: 9.5 mg/dL (ref 8.4–10.5)
Creatinine, Ser: 1.19 mg/dL (ref 0.40–1.50)
Total Bilirubin: 1.4 mg/dL — ABNORMAL HIGH (ref 0.3–1.2)

## 2008-12-14 LAB — CBC WITH DIFFERENTIAL/PLATELET
Basophils Absolute: 0 10*3/uL (ref 0.0–0.1)
EOS%: 0.8 % (ref 0.0–7.0)
HCT: 45.8 % (ref 38.4–49.9)
HGB: 15 g/dL (ref 13.0–17.1)
MCH: 26.6 pg — ABNORMAL LOW (ref 27.2–33.4)
MCV: 81.6 fL (ref 79.3–98.0)
MONO%: 6.5 % (ref 0.0–14.0)
NEUT%: 59.6 % (ref 39.0–75.0)

## 2009-02-10 ENCOUNTER — Ambulatory Visit: Payer: Self-pay | Admitting: Oncology

## 2009-02-19 LAB — COMPREHENSIVE METABOLIC PANEL
ALT: 29 U/L (ref 0–53)
Albumin: 4 g/dL (ref 3.5–5.2)
Alkaline Phosphatase: 58 U/L (ref 39–117)
CO2: 30 mEq/L (ref 19–32)
Potassium: 4 mEq/L (ref 3.5–5.3)
Sodium: 137 mEq/L (ref 135–145)
Total Bilirubin: 1.3 mg/dL — ABNORMAL HIGH (ref 0.3–1.2)
Total Protein: 7.8 g/dL (ref 6.0–8.3)

## 2009-02-19 LAB — CBC WITH DIFFERENTIAL/PLATELET
BASO%: 0.3 % (ref 0.0–2.0)
LYMPH%: 41.9 % (ref 14.0–49.0)
MCHC: 32 g/dL (ref 32.0–36.0)
MONO#: 0.4 10*3/uL (ref 0.1–0.9)
Platelets: 218 10*3/uL (ref 140–400)
RBC: 5.76 10*6/uL (ref 4.20–5.82)
RDW: 14 % (ref 11.0–14.6)
WBC: 5.7 10*3/uL (ref 4.0–10.3)

## 2009-02-19 LAB — LACTATE DEHYDROGENASE: LDH: 139 U/L (ref 94–250)

## 2009-06-04 ENCOUNTER — Ambulatory Visit: Payer: Self-pay | Admitting: Oncology

## 2009-07-06 ENCOUNTER — Ambulatory Visit: Payer: Self-pay | Admitting: Oncology

## 2009-07-06 ENCOUNTER — Ambulatory Visit (HOSPITAL_COMMUNITY): Admission: RE | Admit: 2009-07-06 | Discharge: 2009-07-06 | Payer: Self-pay | Admitting: Oncology

## 2009-07-06 LAB — CBC WITH DIFFERENTIAL/PLATELET
Basophils Absolute: 0.1 10*3/uL (ref 0.0–0.1)
Eosinophils Absolute: 0.1 10*3/uL (ref 0.0–0.5)
HCT: 48.1 % (ref 38.4–49.9)
HGB: 15.6 g/dL (ref 13.0–17.1)
MCV: 83.9 fL (ref 79.3–98.0)
MONO%: 8.3 % (ref 0.0–14.0)
NEUT#: 2.5 10*3/uL (ref 1.5–6.5)
NEUT%: 40.3 % (ref 39.0–75.0)
Platelets: 225 10*3/uL (ref 140–400)
RDW: 13.6 % (ref 11.0–14.6)

## 2009-07-06 LAB — COMPREHENSIVE METABOLIC PANEL
Albumin: 4 g/dL (ref 3.5–5.2)
Alkaline Phosphatase: 58 U/L (ref 39–117)
BUN: 17 mg/dL (ref 6–23)
Calcium: 9.5 mg/dL (ref 8.4–10.5)
Glucose, Bld: 96 mg/dL (ref 70–99)
Potassium: 4.4 mEq/L (ref 3.5–5.3)

## 2010-01-04 ENCOUNTER — Ambulatory Visit (HOSPITAL_COMMUNITY): Admission: RE | Admit: 2010-01-04 | Discharge: 2010-01-04 | Payer: Self-pay | Admitting: Oncology

## 2010-01-04 ENCOUNTER — Ambulatory Visit: Payer: Self-pay | Admitting: Oncology

## 2010-01-04 LAB — CBC WITH DIFFERENTIAL/PLATELET
BASO%: 0.8 % (ref 0.0–2.0)
EOS%: 0.9 % (ref 0.0–7.0)
HCT: 46.3 % (ref 38.4–49.9)
LYMPH%: 49.7 % — ABNORMAL HIGH (ref 14.0–49.0)
MCH: 26.2 pg — ABNORMAL LOW (ref 27.2–33.4)
MCHC: 31.8 g/dL — ABNORMAL LOW (ref 32.0–36.0)
MONO%: 7.5 % (ref 0.0–14.0)
NEUT%: 41.1 % (ref 39.0–75.0)
Platelets: 227 10*3/uL (ref 140–400)
RBC: 5.62 10*6/uL (ref 4.20–5.82)

## 2010-01-11 LAB — COMPREHENSIVE METABOLIC PANEL
ALT: 17 U/L (ref 0–53)
AST: 21 U/L (ref 0–37)
Alkaline Phosphatase: 52 U/L (ref 39–117)
Creatinine, Ser: 1.2 mg/dL (ref 0.40–1.50)
Sodium: 140 mEq/L (ref 135–145)
Total Bilirubin: 1 mg/dL (ref 0.3–1.2)

## 2010-01-11 LAB — LACTATE DEHYDROGENASE: LDH: 149 U/L (ref 94–250)

## 2010-04-04 ENCOUNTER — Ambulatory Visit: Payer: Self-pay | Admitting: Oncology

## 2010-05-07 ENCOUNTER — Encounter: Payer: Self-pay | Admitting: Oncology

## 2010-05-07 ENCOUNTER — Other Ambulatory Visit: Payer: Self-pay | Admitting: Oncology

## 2010-05-07 DIAGNOSIS — C801 Malignant (primary) neoplasm, unspecified: Secondary | ICD-10-CM

## 2010-05-07 DIAGNOSIS — C189 Malignant neoplasm of colon, unspecified: Secondary | ICD-10-CM

## 2010-05-08 ENCOUNTER — Encounter: Payer: Self-pay | Admitting: Oncology

## 2010-05-08 ENCOUNTER — Encounter: Payer: Self-pay | Admitting: Neurology

## 2010-05-09 ENCOUNTER — Encounter: Payer: Self-pay | Admitting: Oncology

## 2010-06-21 ENCOUNTER — Other Ambulatory Visit: Payer: Self-pay | Admitting: Oncology

## 2010-06-21 ENCOUNTER — Encounter (HOSPITAL_COMMUNITY): Payer: Self-pay

## 2010-06-21 ENCOUNTER — Encounter (HOSPITAL_BASED_OUTPATIENT_CLINIC_OR_DEPARTMENT_OTHER): Payer: 59 | Admitting: Oncology

## 2010-06-21 ENCOUNTER — Other Ambulatory Visit (HOSPITAL_COMMUNITY): Payer: Self-pay

## 2010-06-21 ENCOUNTER — Ambulatory Visit (HOSPITAL_COMMUNITY)
Admission: RE | Admit: 2010-06-21 | Discharge: 2010-06-21 | Disposition: A | Discharge status: Home / Self Care | Payer: Medicare Other | Source: Ambulatory Visit | Attending: Oncology | Admitting: Oncology

## 2010-06-21 DIAGNOSIS — D1779 Benign lipomatous neoplasm of other sites: Secondary | ICD-10-CM | POA: Insufficient documentation

## 2010-06-21 DIAGNOSIS — C778 Secondary and unspecified malignant neoplasm of lymph nodes of multiple regions: Secondary | ICD-10-CM

## 2010-06-21 DIAGNOSIS — M545 Low back pain, unspecified: Secondary | ICD-10-CM | POA: Insufficient documentation

## 2010-06-21 DIAGNOSIS — C187 Malignant neoplasm of sigmoid colon: Secondary | ICD-10-CM

## 2010-06-21 DIAGNOSIS — K439 Ventral hernia without obstruction or gangrene: Secondary | ICD-10-CM | POA: Insufficient documentation

## 2010-06-21 DIAGNOSIS — Y838 Other surgical procedures as the cause of abnormal reaction of the patient, or of later complication, without mention of misadventure at the time of the procedure: Secondary | ICD-10-CM | POA: Insufficient documentation

## 2010-06-21 DIAGNOSIS — M109 Gout, unspecified: Secondary | ICD-10-CM

## 2010-06-21 DIAGNOSIS — Z9221 Personal history of antineoplastic chemotherapy: Secondary | ICD-10-CM | POA: Insufficient documentation

## 2010-06-21 DIAGNOSIS — K4090 Unilateral inguinal hernia, without obstruction or gangrene, not specified as recurrent: Secondary | ICD-10-CM | POA: Insufficient documentation

## 2010-06-21 DIAGNOSIS — C189 Malignant neoplasm of colon, unspecified: Secondary | ICD-10-CM | POA: Insufficient documentation

## 2010-06-21 DIAGNOSIS — T82898A Other specified complication of vascular prosthetic devices, implants and grafts, initial encounter: Secondary | ICD-10-CM | POA: Insufficient documentation

## 2010-06-21 DIAGNOSIS — C801 Malignant (primary) neoplasm, unspecified: Secondary | ICD-10-CM

## 2010-06-21 DIAGNOSIS — C787 Secondary malignant neoplasm of liver and intrahepatic bile duct: Secondary | ICD-10-CM

## 2010-06-21 DIAGNOSIS — K7689 Other specified diseases of liver: Secondary | ICD-10-CM | POA: Insufficient documentation

## 2010-06-21 HISTORY — DX: Malignant neoplasm of colon, unspecified: C18.9

## 2010-06-21 HISTORY — DX: Essential (primary) hypertension: I10

## 2010-06-21 HISTORY — DX: Malignant (primary) neoplasm, unspecified: C80.1

## 2010-06-21 LAB — CBC WITH DIFFERENTIAL/PLATELET
Basophils Absolute: 0 10*3/uL (ref 0.0–0.1)
EOS%: 1.3 % (ref 0.0–7.0)
HCT: 51.7 % — ABNORMAL HIGH (ref 38.4–49.9)
HGB: 16.6 g/dL (ref 13.0–17.1)
MCH: 26.5 pg — ABNORMAL LOW (ref 27.2–33.4)
MCV: 82.6 fL (ref 79.3–98.0)
MONO%: 5.5 % (ref 0.0–14.0)
NEUT%: 52.4 % (ref 39.0–75.0)
Platelets: 215 10*3/uL (ref 140–400)

## 2010-06-21 LAB — CMP (CANCER CENTER ONLY)
Albumin: 3.6 g/dL (ref 3.3–5.5)
Alkaline Phosphatase: 65 U/L (ref 26–84)
BUN, Bld: 14 mg/dL (ref 7–22)
Glucose, Bld: 116 mg/dL (ref 73–118)
Potassium: 4.5 mEq/L (ref 3.3–4.7)
Total Bilirubin: 1.2 mg/dl (ref 0.20–1.60)

## 2010-06-21 LAB — CEA: CEA: 2.8 ng/mL (ref 0.0–5.0)

## 2010-06-21 MED ORDER — IOHEXOL 300 MG/ML  SOLN
125.0000 mL | Freq: Once | INTRAMUSCULAR | Status: AC | PRN
  Administered 2010-06-21: 125 mL via INTRAVENOUS

## 2010-06-28 ENCOUNTER — Encounter (HOSPITAL_BASED_OUTPATIENT_CLINIC_OR_DEPARTMENT_OTHER): Payer: 59 | Admitting: Oncology

## 2010-06-28 DIAGNOSIS — C787 Secondary malignant neoplasm of liver and intrahepatic bile duct: Secondary | ICD-10-CM

## 2010-06-28 DIAGNOSIS — C778 Secondary and unspecified malignant neoplasm of lymph nodes of multiple regions: Secondary | ICD-10-CM

## 2010-06-28 DIAGNOSIS — C187 Malignant neoplasm of sigmoid colon: Secondary | ICD-10-CM

## 2010-06-28 DIAGNOSIS — M109 Gout, unspecified: Secondary | ICD-10-CM

## 2010-06-30 LAB — POCT I-STAT, CHEM 8
BUN: 11 mg/dL (ref 6–23)
Calcium, Ion: 1.19 mmol/L (ref 1.12–1.32)
HCT: 51 % (ref 39.0–52.0)
Hemoglobin: 17.3 g/dL — ABNORMAL HIGH (ref 13.0–17.0)
Sodium: 140 mEq/L (ref 135–145)
TCO2: 30 mmol/L (ref 0–100)

## 2010-07-01 ENCOUNTER — Other Ambulatory Visit (HOSPITAL_COMMUNITY): Payer: Self-pay

## 2010-08-30 NOTE — Consult Note (Signed)
NAMEMarland Allen  RC, AMISON              ACCOUNT NO.:  [id number]   MEDICAL RECORD NO.:  [id number]          PATIENT TYPE:  INP   LOCATION:  2115                         FACILITY:  [facility]   PHYSICIAN:  Mindi Slicker. Lowell Guitar, M.D.  DATE OF BIRTH:  11-30-1960   DATE OF CONSULTATION:  DATE OF DISCHARGE:                                 CONSULTATION   REASON OF CONSULT:  Possible need for hemodialysis.   HISTORY OF PRESENT ILLNESS:  The history of present illness is as per  EMR and the family.  The patient is a 50 year old [place] American man  with PMH of metastatic cancer, metastatic colon cancer status post  partial colectomy and partial hepatectomy presently on remission was  brought to ER by EMS and intubated.  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  He was working in close  space.  The patient's friend came for help but was himself unconscious  for about one-half to two a hours.  Later when he woke up, he called  EMS.  According to the chart, the patient was unresponsive but  hemodynamically stable when EMS arrived.  He was intubated and brought  to the emergency.  Further workup is positive for severe acidosis,  evolving renal failure and Nephrology was consulted to care for the  same.  This was concerned for methanol toxicity.  The patient's family  were notified from the emergency and they did not have any additional  history.  According to the patient, according to the chart and the  patient's family, the patient came with presyncopal episode in April  2008, after exposed to fumes at its broke and was discharged from the  ER.   PAST MEDICAL HISTORY:  1. Colon cancer, status post partial colectomy in 2001, and partial      hepatectomy in 2003.  His last chemotherapy was in 2008, and      currently he was on remission as told by the patient's oncologist      Dr. Cyndie Chime.  2. Hypertension, currently on one blood pressure pill.   ALLERGIES:  Did not known drug allergies.   HOME MEDICATIONS:  One blood pressure pill and the name is unknown.   SOCIAL HISTORY:  Raymond Allen lives in [place] with his girlfriend.  He  works as a Engineer, water.  He does not have any children.  He has a couple of  years of tobacco abuse, not sure about the exact years and how much he  smokes every day.  He does not use alcohol regularly and does not drink  and does not do any other drugs.  He has eleventh grade education.   Family history is negative for any cardiac or kidney problems.   Review of systems is not obtainable.   PHYSICAL EXAMINATION:  VITAL SIGNS:  Temperature is 106.5, pulse 166,  repeat was 164; respiration 12; blood pressure 68/55, but repeat was 72/  58; oxygen saturation 90 on ventilator on 100% oxygen.  GENERAL:  He is sedated and on an  endotracheal tube.  Eyes, pupils  pinpoint bilaterally and nonreactive.  Sclerae clear.  NECK:  Supple.  No JVD.  CARDIOVASCULAR:  Regular rate and rhythm.  No rubs, murmurs, or gallops.  LUNGS:  Clear to auscultation bilaterally anteriorly.  SKIN:  No rashes or lesions.  ABDOMINAL:  Midline scar extending from the xyphoid to the symphysis  pubis, soft and nontender.  No organomegaly.  EXTREMITIES:  No pitting  pedal edema.  No rash, lesions, or petechiae.  NEUROLOGIC:  Sedated and has some cough when the patient was attempted  to clean his throat.   ADMISSION LABORATORY DATA:  WBC 7.4, hemoglobin 15.4, and platelets 238.  Sodium 146, potassium 4.7, chloride 113, bicarb 18, BUN 17, creatinine  2.88, and glucose 175.  Anion gap 13, total bilirubin 1.5, direct 0.3,  indirect 1.2, alk phos 51, AST 34, ALT 19, total protein 8.5, albumin  4.4, and calcium 9.1.  Blood gas first set 7.334 pH, pCO2 42.3, PO2 308,  and bicarb 21.8 on 100% oxygen.  The last set; pH 7.115, pCO2 63.7, PO2  234, bicarb 18.7 with temperature of 103.4 and  on 100% oxygen.  Chest x-  ray; no acute disease, NG tube in place,  right Port-A-Cath is in right  IJ, and ET-tube in good position.   ASSESSMENT/PLAN:  Raymond Allen is a 50 year old gentleman with past  medical history of metastatic colon cancer currently in remission and  hypertension came to ER unresponsive after exposed to fumes at its  broke.  1. Altered mental status.  This is probably combination of toxic      encephalopathy as well as shock.  He was found unresponsive at      least for 2 hours according to the EMR and probably more.  He is      now in shock, renal failure, unresponsive, and he has methanol      toxicity and probably secondary damage to other vital organs.  We      spoke with the Poison Control and the recommendation is to treat      medically with sodium bicarb, Fomepizole, and regular dialysis.      The patient is in shock and on pretty high dose of Levophed.  We      will attempt regular hemodialysis but we are afraid that he may not      tolerate it hemodynamically and we may have to switched to CVVHD.      He has a right femoral hemodialysis catheter placed.  We will      follow him closely with his BMET and other labs.  His blood has      been sent for serum osmolality, urine osmolality, polyethylene      glycol, alcohol levels, as well as methanol level.  He is started      on folate and Fomepizole and he is about to get his hemodialysis.  2. Shock.  The etiology is unclear.  He is on IV fluids plus Levophed      45.  He is trying to maintain his blood pressure at the time of      dictation and blood culture has been sent.  The patient is started      empirically on Zosyn.  Further care is per critical care MD.  3. Renal insufficiency.  Mr. Reine creatinine was 1 in November      2001, and at today his creatinine is 2.88.  We do not know if this  is chronic or not, but it looks like he definitely has some acute      component.  We will get fractional  excretion of sodium renal      ultrasound.  We will hydrate and try to maintain his blood pressure      with pressors and follow closely.  Hemodialysis will hopefully take      care of his acidosis and the methanol and also probably accelerate      recovery.  4. Acidosis.  He is on bicarb, hemodialysis, and Fomepizole.  Please      note that there is also some component of respiratory acidosis and      the patient is on a ventilator.  5. Colon cancer.  This is in remission.  6. Prophylaxis.  He is on Protonix and SCDs.   Thank you very much for allowing [place] to see this patient.      Jason Coop, MD  Electronically Signed      Mindi Slicker. Lowell Guitar, M.D.  Electronically Signed    YP/MEDQ  D:  11/29/2007  T:  11/29/2007  Job:  [id number]   cc:   Genene Churn. Cyndie Chime, M.D.  Leslye Peer, MD

## 2010-08-30 NOTE — Discharge Summary (Signed)
NAME:  Raymond Allen, Raymond Allen              ACCOUNT NO.:  [id number]   MEDICAL RECORD NO.:  [id number]          PATIENT TYPE:  INP   LOCATION:  4730                         FACILITY:  [facility]   PHYSICIAN:  Oley Balm. Sung Amabile, MD   DATE OF BIRTH:  06/12/1960   DATE OF ADMISSION:  11/29/2007  DATE OF DISCHARGE:  12/03/2007                               DISCHARGE SUMMARY   ADMISSION DIAGNOSES:  1. 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  2. Possible methanol and methylene chloride toxicity.  3. Ventilator-dependent respiratory failure.  4. Severe metabolic acidosis.  5. Loss of consciousness.  6. Shock.  7. Elevated liver function tests.  8. History of colon cancer - in remission.   DISCHARGE DIAGNOSES:  1. 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  2. Possible methanol and methylene chloride toxicity.  3. Ventilator-dependent respiratory failure.  4. Severe metabolic acidosis.  5. Loss of consciousness.  6. Shock.  7. Elevated liver function tests.  8. History of colon cancer - in remission.  9. All aspects of severe critical illness, resolved.   HISTORY OF PRESENT ILLNESS:  Please refer to the admission history and  physical for this patient's initial presentation.  Briefly, Raymond Allen is a 50-  year-old gentleman who was brought to the emergency department by EMS  after heavy exposure to fumes of a paint thinner, which contains  methanol and methylene chloride.  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  Raymond Allen was  witnessed to lose consciousness and responsiveness.  A coworker  attempted to rescue him but the coworker was overcome by fumes and could  not stand the truck.  It took approximately 1-1/2-2 hours to summon  emergency medical personnel who then removed the patient from the truck  and administered full spinal immobilization intubation and ventilatory  support.  At this time, the patient  was transported to the emergency  department where further stabilization was undertaken and Critical Care  Medicine was asked to admit him.  Upon admission, Raymond Allen was found to have  the extensive problem list as noted above including severe metabolic  acidosis, loss of consciousness, possible myoclonus, hypotension,  ventilation-dependant respiratory failure, and elevated liver function  tests.  Initial concern was for possible methanol toxicity.   HOSPITAL COURSE:  Raymond Allen was supported with mechanical ventilation and was  treated with sodium bicarbonate, Fomepizole, ventilatory support, and  vasopressors.  Hemodialysis was initiated based on the possibility of  methanol toxicity.  However, methanol levels in the serum were negative  and there was no osmolar gap.  By the evening of November 29, 2007, his  cognitive function had improved remarkably and Raymond Allen passed a spontaneous  breathing trial and was extubated.  No further hemodialysis was  administered.  By the morning of December 01, 2007, Raymond Allen was deemed  sufficiently improved to be transferred to a regular medical bed.  The  rest of this hospitalization was uncomplicated.  On the day of  admission, Raymond Allen is in markedly improved condition with no persistent  complaints of shortness of  breath.  On this day, Raymond Allen does have persistent  mild elevation of transaminases with SGOT of 88 and SGPT of 134.  However, his bilirubin, which was elevated at 3.2 on the November 30, 2007, has now come down to 1.6.  The rest of his chemistries and acid-  base disturbance have resolved and are normal.   DISCHARGE MEDICATIONS:  None.   FOLLOW UP:  Raymond Allen is scheduled to Tammy Parrett in the office with [facility] in 2 weeks.  At that time, Raymond Allen will need a recheck of liver  function tests.  Raymond Allen has no primary care physician and I have encouraged  him to obtain one.  When Raymond Allen sees [employer], we can offer  arrangements for him to see one of the [place] primary care  physician.  Raymond Allen follows regularly with Dr. Cyndie Chime, who has functioned largely as  his primary care physician to date.  Followup with Dr. Cyndie Chime is to  be as previously arranged.      Oley Balm Sung Amabile, MD  Electronically Signed     DBS/MEDQ  D:  12/03/2007  T:  12/03/2007  Job:  [id number]   cc:   Genene Churn. Cyndie Chime, M.D.

## 2010-09-02 NOTE — Op Note (Signed)
[facility]  Patient:    Raymond Allen, Raymond Allen                     MRN: [id number] Proc. Date: 02/08/00 Adm. Date:  [id number] Attending:  Charlton Haws                           Operative Report  CCS [phone].  PREOPERATIVE DIAGNOSIS:  Carcinoma of the sigmoid colon with probable liver metastasis.  POSTOPERATIVE DIAGNOSIS:  Carcinoma of the sigmoid colon with probable liver metastasis.  PROCEDURE:  Sigmoid colectomy with a primary anastomosis with partial- thickness resection of bladder wall, incidental appendectomy, ultrasound examination of liver.  SURGEON:  Currie Paris, M.D.  ASSISTANT:  Sandria Bales. Ezzard Standing, M.D.  ANESTHESIA:  General endotracheal.  CLINICAL HISTORY:  The patient is a 50 year old male who presented a few weeks ago with what appeared to be a perforated diverticulitis, who resolved on antibiotic therapy.  There was a liver lesion noted on both CT and MRI, which was thought most likely to represent an atypical hemangioma.  After discharge, I went ahead and got an early barium enema because I was concerned that he might have colon cancer that had performed because of his somewhat atypical CT findings, and indeed this appeared to be the case.  He was brought to the operating room today for sigmoid colectomy and evaluation of the liver with possible liver biopsy.  DESCRIPTION OF PROCEDURE:  The patient was brought to the operating room and after satisfactory general endotracheal anesthesia had been obtained, the abdomen was prepped and draped.  A Foley catheter had been placed.  The abdomen was opened through a long midline incision and explored.  There were no gross lesions noted in the liver, but on very careful palpation, high up in the left segment of the left lobe of the liver, there was a relatively firm area that was though to represent a mass.  There was no surface abnormality noted, and this appeared to be right  adjacent to a lobulation of the liver, so it would be lateral to that.  The stomach and small bowel appeared to be normal.  No other lesions were noted in the colon other than the proximal sigmoid, which was stuck to the left anterior bladder wall.  After appropriate self-retaining retractors had been moved up into place, I began approaching the colon where it was stuck to the bladder and freed up a few flimsy adhesions and then using cautery, took the tumor off of where it was attached to the bladder, taking a very thin little bit of bladder muscle but not actually entering the bladder, just so I did not get into an area where there might have been a perforation and a few malignant cells, although this did otherwise look nonmalignant.  Once this was done, I freed up the colon a little bit more and identified the left ureter.  I selected a place well proximal to the tumor and divided the mesentery from this down to the base, taking the inferior mesenteric artery near its origin.  The mesentery was then incised distally so I could get well distal to the tumor and then divided.  The colon was divided between bowel clamps, and the two ends came together easily with no tension.  I then did a stapled end-to-end anastomosis using the triangulation technique.  The mesenteric defect was closed with some 3-0 silks.  The  wound was irrigated and checked for hemostasis, and everything appeared to be dry.  In taking off a portion of the bladder, we did divide because it was involved in the inflammatory process a small segment of vas deferens.  At this point, I noted that the appendix was laying fairly medially and dissected that up, divided the mesoappendix between clamps, and ligated down to the base of the appendix.  The base was crushed with a clamp and ligated with an 0 chromic.  A pursestring 3-0 silk was placed.  The appendix was amputated with the stump inverted and the pursestring tied  down.  The abdomen was then irrigated and again, as everything noted appeared to be dry.  At this point, I brought the ultrasound in and used a sterile covered probe.  I tried to ultrasound the area in question about the liver and had Otis N. Sherrie Mustache, M.D., in to review this as we were doing it.  There did appear to be about a 3 cm lesion that did not appear to be an angioma and was felt most likely to be a metastasis.  This was somewhat posterior, and we did not think it would be very amenable to a direct biopsy, as it was very difficult to feel and there appeared to be a large vessel adjacent to it.  After discussion with Dr. Sherrie Mustache, we elected to postpone biopsy and try to do this percutaneously with CT guidance and after Dr. Sherrie Mustache had a chance to review the ultrasound along with all the MRI and CT findings.  We again checked for hemostasis, and again everything appeared to be dry.  The abdomen was closed with a PDS on the posterior sheath and then running #1 Novofil on the fascia.  The wound was irrigated and the skin closed with staples.  The patient tolerated the procedure well.  Estimated blood loss was 150 cc.  There were no complications. DD:  02/08/00 TD:  02/08/00 Job: [id number]

## 2010-09-02 NOTE — Op Note (Signed)
NAME:  Raymond Allen, Raymond Allen                        ACCOUNT NO.:  [id number]   MEDICAL RECORD NO.:  [id number]                   PATIENT TYPE:  AMB   LOCATION:  ENDO                                 FACILITY:  [facility]   PHYSICIAN:  Anselmo Rod, M.D.               DATE OF BIRTH:  10/08/1960   DATE OF PROCEDURE:  08/31/2003  DATE OF DISCHARGE:                                 OPERATIVE REPORT   PROCEDURE:  Colonoscopy with multiple biopsies.   ENDOSCOPIST:  Charna Elizabeth, M.D.   INSTRUMENT USED:  Olympus video colonoscope.   INDICATIONS FOR PROCEDURE:  This is a 50 year old African American male  undergoing a screening colonoscopy for recurrent rectal bleeding.  The  patient has a history of colon cancer requiring a left sigmoid colectomy in  2001 with metastatic disease to the liver.   PROCEDURE PERFORMED:  Informed consent was procured from the patient.  The  patient fasted for eight hours prior to the procedure and prepped with a  bottle of magnesium citrate and a gallon of GOLYTELY the night prior to the  procedure.   PREPROCEDURE PHYSICAL EXAMINATION:  VITAL SIGNS:  The patient had stable  vital signs.  NECK:  Supple.  CHEST:  Clear to auscultation.  HEART:  S1 and S2 regular.  ABDOMEN:  Soft with normal bowel sounds.   DESCRIPTION OF PROCEDURE:  The patient was placed in the left lateral  decubitus position, sedated with 60 mg of Demerol and 6 mg of Versed  intravenously.  Once the patient was adequately sedated and maintained on  low flow oxygen and continuous cardiac monitoring, the Olympus video  colonoscope was advanced from the rectum to the cecum. There was some  residual stool in the colon.  Multiple washings were done.  The appendiceal  orifice was clearly visualized.  A small polypoid lesion was biopsied from  the cecal base.  Another polyp was biopsied just proximal to the  anastomosis.  At 20 cm, a small sessile polyp was biopsied from the rectum.  Multiple washings  were done.  The patient's position was changed from the  left lateral to the supine positio with gentle application of abdominal  pressure to reach the cecum, and retroflexion in the rectum revealed small  internal hemorrhoids.  The patient tolerated the procedure well without  complications.   IMPRESSION:  1. Multiple small sessile polyps, biopsied (see description above).  2. Small internal hemorrhoids.  3. Anastomosis at 20 cm.  4. Small residual stool in the colon; small lesions could have been missed.   RECOMMENDATIONS:  1. Await pathology results.  I do not feel the patient needs colorectal     cancer screening in the future given his     medical history of prognosis.  2. Avoid nonsteroidals including aspirin for the next 4 weeks.  3. High fiber diet with liberal fluid intake.  Anselmo Rod, M.D.    JNM/MEDQ  D:  08/31/2003  T:  08/31/2003  Job:  [id number]   cc:   Genene Churn. Cyndie Chime, M.D.  [address] [facility]  [place]  [place] [address]  Fax: [phone]   Adolph Pollack, M.D.  [address], [address]  [place]  [place] [address]  Fax: [phone]

## 2010-09-02 NOTE — Discharge Summary (Signed)
[facility]  Patient:    Raymond Allen, Raymond Allen                     MRN: [id number] Adm. Date:  [id number] Disc. Date: 01/10/00 Attending:  Charlton Haws CC:         Lesle Chris, M.D.   Discharge Summary  ACCOUNT:  [id number]  OFFICE MEDICAL RECORD NUMBER:  [id number]  FINAL DIAGNOSES: 1. Acute diverticulitis. 2. Volume depletion secondary to #1. 3. Liver mass, questionable hemangioma.  CLINICAL HISTORY:  Raymond Allen is a 50 year old gentleman who was seen by Dr. Cleta Alberts on the September 15 with abdominal pain and some urinary symptoms, started on Cipro and Pyridium, but had increasing pain, and CT showed what appeared to be diverticulitis with a liver lesion of uncertain etiology.  On initial exam he was markedly tender in the lower abdomen suprapubically with some tenderness up into the left upper quadrant, guarding, and fullness in the abdomen.  LABORATORY AND X-RAY DATA:  Laboratory studies included a white count of 13,000 with a hemoglobin of 13.5.  HOSPITAL COURSE:  The patient was admitted after going home from the emergency room to take care of some personal business.  He was begun on IV antibiotics including Cipro and Flagyl.  He ran fevers to 102 initially, and the first 24 hours really did not improve much, but from that point gradually and very slowly got better.  His temperature gradually improved, and his white counts stayed stable before falling to normal at about five days.  Each day his abdomen became a little less tender, although still remained for several days markedly tender in the left lower quadrant and suprapubic area.  An MRI was done of the liver lesion which was interpreted as probably an atypical hemangioma, but that was not completely definitive.  Follow-up CT showed that the lesion in the liver remained stable.  At one point it was perhaps thought to be an abscess, but this never appeared to be definite.  A final CT  on September 24 showed improvement in the phlegmonous process in the midsigmoid. There did not appear to be any discrete drainable areas.  There was marked wall thickening, but he lumen was patent.  By September 25 he had been afebrile for approximately 48 hours on IV antibiotics.  His abdominal tenderness had markedly improved such that he was just minimally tender in the left lower quadrant.  He was tolerating some food, although he had very limited appetite.  His white count had come down to 6700.  His hemoglobin had remained stable at 12.1.  After the initial potassium report of 6 was repeated it turned out that that was a lab error and they were normal.  Urinalysis was negative.  Blood cultures showed no growth.  After lengthy discussion on the day of discharge we went over alternatives. One alternative was to do a bowel prep and proceed to sigmoid resection with hopefully a primary anastomosis, although the inflammatory process might still be such that he would need a temporary colostomy.  Another alternative at this point since he was clinically significantly improved was to treat him as an outpatient with oral antibiotics (Cipro and Flagyl) and treat him for a total of two weeks with a follow-up CT scan in approximately one week.  He understands that we will need to follow him up in the office, follow-up CT scan next week, and that he will need other studies to evaluate the area of  the colon to be sure this is not a malignant process as opposed to diverticulitis and further follow up the area in the liver once the acute process is completely resolved.  He also understands that he may fail oral outpatient therapy and require surgical intervention or that he may have a narrowing of the sigmoid that will require surgical intervention.  At this point he is comfortable with going home, and that is his preference rather than going ahead with surgical intervention at this point, and I  think that would be appropriate given he has clinically improved.  I think all of the questions have been answered, and we are going to discharge him on Cipro/Flagyl with a planned CT scan next Monday and follow-up in my office the next day so we will have a report on the scan. DD:  01/10/00 TD:  01/10/00 Job: [id number]

## 2010-09-02 NOTE — Op Note (Signed)
[facility]  Patient:    Raymond Allen, Raymond Allen                     MRN: [id number] Proc. Date: 10/08/00 Adm. Date:  [id number] Attending:  Charlton Haws CC:         Genene Churn. Cyndie Chime, M.D.   Operative Report  ACCOUNT NO. [id number]. CCS [id number].  PREOPERATIVE DIAGNOSIS:  Carcinoma of colon with inadequate venous access to continue chemotherapy.  POSTOPERATIVE DIAGNOSIS:  Carcinoma of colon with inadequate venous access to continue chemotherapy.  PROCEDURE:  Placement of Port-A-Cath.  SURGEON:  Currie Paris, M.D.  ANESTHESIA:  General.  CLINICAL HISTORY:  This patient is a 50 year old undergoing chemotherapy, who will need better IV access.  He declined to come to the office for preoperative consultation, so I met with him in the holding area.  We went over the indications, risks, and complications of surgery.  I think all questions were answered to his satisfaction, and we will plan to proceed.  DESCRIPTION OF PROCEDURE:  The patient was brought to the operating room. After satisfactory general endotracheal anesthesia had been obtained, the upper chest and lower neck were prepped and draped as a sterile field.  [name] is a quite muscular gentleman and on numerous attempts, I was unable to enter the subclavian vein on either side.  I never actually successfully got any backflow of blood.  He always seemed to have a very tight space between his clavicle, first rib, and some tight fascia there that precluded my safely entering.  At this point Dr. Gypsy Balsam came in and was able to enter the right internal jugular vein readily, and the guidewire passed into the superior vena cava and its position checked fluoroscopically.  At this point I made a transverse incision at the right anterior chest and fashioned a pocket for the Port-A-Cath reservoir.  I brought the tubing from that site into the guidewire site.  I had to make a counter  incision part-way because the tendon puller was not quite long enough to reach.  The catheter was flushed.  The guidewire tract was dilated once with a #10 dilator and then the dilator and peel-away sheath placed easily and the dilator and guidewire removed.  The catheter threaded easily into the superior vena cava and actually down into the right atrium.  Using fluoro, I backed it up into the superior vena cava just proximal to the junction.  It aspirated and irrigated easily.  The reservoir was flushed and attached to the tubing and again aspirated and irrigated easily.  The reservoir was sutured to it with a Prolene down to the anterior muscle fascia.  Fluoro was used to check positioning again. Everything appeared to be okay with no kinks, gentle curves, and what appeared to be the positioning appropriate just proximal to the superior vena cava-atrial junction.  The incision was closed with 3-0 Vicryl, followed by 4-0 Monocryl subcuticular and Steri-Strips.  The patient tolerated the procedure well.  There were no operative complications.  All counts were correct. DD:  10/08/00 TD:  10/09/00 Job: [id number]
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
## 2010-09-02 NOTE — Discharge Summary (Signed)
[facility]  Patient:    Raymond, Allen                     MRN: [id number] Adm. Date:  [id number] Disc. Date: [id number] Attending:  Charlton Haws CC:         Genene Churn. Cyndie Chime, M.D.   Discharge Summary  ACCOUNT NUMBER:  [id number]  OFFICE MEDICAL RECORD NUMBER:  [id number]  FINAL DIAGNOSES: 1. Carcinoma of the sigmoid colon (T3,N1). 2. Postoperative anemia probably secondary to bleeding, but of uncertain    etiology. 3. Liver mass of uncertain significance.  CLINICAL HISTORY:  Raymond Allen is a 50 year old male who was in this hospital a few weeks ago with presumed episode of severe diverticulitis.  He responded to conservative therapy, but shortly after discharge was scheduled for a barium enema to evaluate the area and was found to have an apple core lesion thought secondary to carcinoma.  There had been a noted liver lesion at the time of his prior admission which had been followed, and was thought to be an atypical hemangioma by MRI, but had not been biopsied.  The patient underwent a bowel prep at home and was admitted for sigmoid colectomy.  HOSPITAL COURSE:  The patient was admitted on October 24, and underwent sigmoid colectomy with a primary anastomosis as well as an incidental appendectomy.  Intraoperative ultrasound of the liver showed what appeared to be a metastasis in his liver, although it was not visible and just barely palpable.  It was elected to postpone a biopsy and do a postoperative needle biopsy.  The night after surgery he had a lot of pain and was given some Toradol.  The next morning he was still complaining of pain not well controlled with his PCA.  His vital signs had been stable overnight, and his hemoglobin was unchanged from preoperatively.  His abdomen was soft, tender consistent with surgery, and appeared to be stable.  Chest x-ray was checked and showed some bibasilar atelectasis.  That evening he  developed some hypotension, and his blood pressure drifted to 80/60.  His urine was concentrated, his creatinine had crept up, and he appeared to be volume depleted.  He noted that he had not eaten or drunk anything two days prior to surgery.  CBC was checked, and fluid bolus administered.  His hemoglobin was 11; however, over the next 24 hours it drifted down to about 7.8, although he continued to feel better.  His creatinine returned to normal.  It was unclear whether the patient had been simply markedly volume depleted and responded to fluids or had some bleeding, although I suspect he did bleed perhaps related to the Toradol since his hemoglobin had been perfectly stable in the morning and only drifted down after being administered a couple of doses of Toradol. At any rate, the Toradol was stopped and appeared to be stable.  He continued to have problems with pain control requiring fairly high doses of morphine. By the fifth postoperative day, had not really passed much gas and was placed on some Reglan.  We tried clamping his NG which he initially seemed to tolerate.  Some x-rays have shown what appeared to be either an ileus versus a small-bowel obstruction.  Follow-up the next day showed more air in the colon, and it was thought that this was more likely simply prolonged ileus.  By the seventh postop day, we removed his alternate staples.  On the eighth postoperative day, he insisted  on going home for a few hours to take care of his pets, and we allowed him out for a couple of hours, and he came back quite exhausted and complaining of abdominal pain.  He was tolerating a full liquid diet and having stools and seemed to be slowly improving.  We scheduled a CT biopsy of his liver, but with increased abdominal distension further films were obtained, and again he appeared to have some dilated colon and at that point we did a Gastrografin study.  He had some narrowing at his  anastomosis felt secondary to edema and not really thought to be as stricture, but there was free flow of contrast in both directions over the anastomosis.  Because of his atelectasis we had put him on some Zosyn, and this was discontinued, and we obtained a medical oncology consult by Dr. Cyndie Chime who felt that he would follow him.  His liver biopsy unfortunately showed only normal liver, and it is unclear whether this represented an accurate biopsy.   By the fifteenth postop day, he seemed to be stable.  He did develop a tiny subcapsular hematoma from the liver biopsy so we kept him in one more day, but by November 8, he was felt able to go home.  He did not feel like he was gaining any benefit from being in the hospital.  He was tolerated some diet, and we felt that we could manage his pain on oral medications.  He was therefore discharged to be followed in my office in one week.  DISCHARGE MEDICATIONS:  Given OxyContin and Tylox for pain.  DISCHARGE INSTRUCTIONS:  Limited activities.  Diet:  As tolerated.  LABORATORY DATA:  Laboratories studies included a preoperative hemoglobin of 13 which drifted down to 7.8, but was back to 10.2 just prior to discharge. He was not transfused.  His albumin started out at 4.0 and drifted down to 2.7.  His CEA was 16.7 and drifted down to 6.4 by about 10 days after surgery. Urinalysis was unremarkable.  Blood type was B positive. DD:  03/02/00 TD:  03/02/00 Job: [id number]

## 2010-09-02 NOTE — Procedures (Signed)
. [facility]  Patient:    Raymond Allen, Raymond Allen                     MRN: [id number] Proc. Date: 11/26/00 Adm. Date:  [id number] Attending:  Charna Elizabeth CC:         Genene Churn. Cyndie Chime, M.D.  Currie Paris, M.D.   Procedure Report  DATE OF BIRTH:  Oct 26, 1960  REFERRING PHYSICIAN:  PROCEDURE PERFORMED:  Colonoscopy with biopsies of the cecal lesion.  ENDOSCOPIST:  Anselmo Rod, M.D.  INSTRUMENT USED:  Olympus video colonoscope.  INDICATIONS FOR PROCEDURE:  Rectal bleeding in a 50 year old African-American male with a history of colon cancer status post left hemicolectomy.  The patient has had a recent colonoscopy with a cecal mass biopsied.  Biopsy showed lymphoid aggregate and therefore repeat colonoscopy is being done to rebiopsy this lesion as discussed with the surgeon and the oncologist.  PREPROCEDURE PHYSICAL:  The patient had stable vital signs.  Neck supple. Chest clear to auscultation.  S1, S2 regular.  Abdomen soft with normal bowel sounds.  DESCRIPTION OF PROCEDURE:  The patient was placed in the left lateral decubitus position and sedated with 50 mg of Demerol and 5 mg of Versed intravenously.  Once the patient was adequately sedated and maintained on low-flow oxygen and continuous cardiac monitoring, the Olympus video colonoscope was advanced from the rectum to the cecum without difficulty.  The patient had some residual stool in the colon.  No masses, polyps, erosions, ulcerations were identified in the left colon, transverse colon or right colon except for polypoid mass in the cecal base with central umbilication.  This was biopsied for pathology.  The patient tolerated the procedure well without complication.  IMPRESSION: 1. Small mass at cecal base biopsied for pathology. Otherwise unrevealing    colonoscopy.  RECOMMENDATIONS: 1. Await pathology results. 2. Outpatient follow-up.DD:  11/26/00 TD:   11/26/00 Job: [id number]

## 2010-09-02 NOTE — Procedures (Signed)
[place]  Patient:    Raymond Allen, Raymond Allen                     MRN: [id number] Proc. Date: 10/25/00 Adm. Date:  [id number] Attending:  Charna Elizabeth CC:         Currie Paris, M.D.  Genene Churn. Cyndie Chime, M.D.  Adolph Pollack, M.D.   Procedure Report  DATE OF BIRTH:  [birth year]  REFERRING PHYSICIAN:  PROCEDURE PERFORMED:  Colonoscopy with biopsies.  ENDOSCOPIST:  Anselmo Rod, M.D.  INSTRUMENT USED:  Olympus video colonoscope.  INDICATIONS FOR PROCEDURE:  The patient is a 50 year old African-American male with a history of metastatic colon cancer status post sigmoid resection in October 2001.  He presents with rectal bleeding that has been on an intermittent basis for the last six to seven months.  The patient has also had some worsening constipation in the recent past.  The patients CEA is up to 57.7, almost double of his preoperative value.  PREPROCEDURE PREPARATION:  Informed consent was procured from the patient. The patient was fasted for eight hours prior to the procedure and prepped with a bottle of magnesium citrate and a gallon of NuLytely the night prior to the procedure.  The patient is receiving 1 mg of Coumadin per day for his Port-A-Cath and he was advised to continue this but all nonsteroidals were to ve avoided prior to procedure.  PREPROCEDURE PHYSICAL:  The patient had stable vital signs.  Neck supple. Chest clear to auscultation.  S1, S2 regular.  There is a Port-A-Cath present on the anterior chest wall.  Abdomen soft, nondistended, nontender with well-healed surgical scar in the midline, with normal abdominal bowel sounds. No masses palpable.  DESCRIPTION OF PROCEDURE:  The patient was placed in the left lateral decubitus position and sedated with 75 mg of Demerol and 5 mg of Versed intravenously.  Once the patient was adequately sedated and maintained on low-flow oxygen and continuous cardiac  monitoring, the Olympus video colonoscope was advanced from the rectum to the cecum with extreme difficulty secondary to a large amount of residual stool in the colon.  A polypoid mass was seen covered with stool at the base of the cecum.  Multiple washes were done.  The mass seemed to have a central umbilication.  Biopsies were done from this area.  There was easily bleeding.  No other abnormalities were noted.  However, small lesions could have been missed secondary to inadequate prep.  The patient tolerated the procedure well without complications.  There was no evidence of hemorrhoids on retroflexion in the rectum.  IMPRESSION:  Small polypoid mass in the cecum at the cecal base over the central umbilication.  Biopsy done, results pending.  RECOMMENDATIONS: 1. Await pathology results. 2. Rediscuss the patients situation with Dr. Jamey Ripa and Dr. Cyndie Chime    before further recommendations could be made. 3. Continue Coumadin 1 mg per day as done prior to the procedure. 4. Serial CBCs on an outpatient basis. 5. NuLytely p.r.n. along with stool softeners for severe constipation. 6. Outpatient follow-up in the next two weeks. DD:  10/25/00 TD:  10/26/00 Job: [id number]

## 2011-01-03 ENCOUNTER — Other Ambulatory Visit: Payer: Self-pay | Admitting: Oncology

## 2011-01-03 ENCOUNTER — Encounter (HOSPITAL_BASED_OUTPATIENT_CLINIC_OR_DEPARTMENT_OTHER): Payer: Medicare Other | Admitting: Oncology

## 2011-01-03 DIAGNOSIS — C778 Secondary and unspecified malignant neoplasm of lymph nodes of multiple regions: Secondary | ICD-10-CM

## 2011-01-03 DIAGNOSIS — C187 Malignant neoplasm of sigmoid colon: Secondary | ICD-10-CM

## 2011-01-03 DIAGNOSIS — M109 Gout, unspecified: Secondary | ICD-10-CM

## 2011-01-03 DIAGNOSIS — C787 Secondary malignant neoplasm of liver and intrahepatic bile duct: Secondary | ICD-10-CM

## 2011-01-03 LAB — CBC WITH DIFFERENTIAL/PLATELET
Basophils Absolute: 0 10*3/uL (ref 0.0–0.1)
Eosinophils Absolute: 0 10*3/uL (ref 0.0–0.5)
HCT: 48.6 % (ref 38.4–49.9)
HGB: 16 g/dL (ref 13.0–17.1)
LYMPH%: 39.1 % (ref 14.0–49.0)
MCV: 81 fL (ref 79.3–98.0)
MONO%: 7.5 % (ref 0.0–14.0)
NEUT#: 2.7 10*3/uL (ref 1.5–6.5)
NEUT%: 52.1 % (ref 39.0–75.0)
Platelets: 217 10*3/uL (ref 140–400)
RDW: 13.8 % (ref 11.0–14.6)

## 2011-01-03 LAB — COMPREHENSIVE METABOLIC PANEL
Albumin: 4 g/dL (ref 3.5–5.2)
Alkaline Phosphatase: 64 U/L (ref 39–117)
BUN: 13 mg/dL (ref 6–23)
Glucose, Bld: 115 mg/dL — ABNORMAL HIGH (ref 70–99)
Potassium: 4 mEq/L (ref 3.5–5.3)

## 2011-01-03 LAB — LACTATE DEHYDROGENASE: LDH: 178 U/L (ref 94–250)

## 2011-01-03 LAB — CEA: CEA: 2.2 ng/mL (ref 0.0–5.0)

## 2011-01-10 ENCOUNTER — Encounter (HOSPITAL_BASED_OUTPATIENT_CLINIC_OR_DEPARTMENT_OTHER): Payer: Medicare Other | Admitting: Oncology

## 2011-01-10 DIAGNOSIS — Z8505 Personal history of malignant neoplasm of liver: Secondary | ICD-10-CM

## 2011-01-10 DIAGNOSIS — I10 Essential (primary) hypertension: Secondary | ICD-10-CM

## 2011-01-10 DIAGNOSIS — Z85038 Personal history of other malignant neoplasm of large intestine: Secondary | ICD-10-CM

## 2011-01-13 LAB — POCT I-STAT, CHEM 8
BUN: 18
Chloride: 112
Creatinine, Ser: 2.4 — ABNORMAL HIGH
Glucose, Bld: 153 — ABNORMAL HIGH
Potassium: 4.6
Sodium: 148 — ABNORMAL HIGH

## 2011-01-13 LAB — POCT I-STAT 3, ART BLOOD GAS (G3+)
Acid-Base Excess: 1
Acid-Base Excess: 2
Acid-Base Excess: 8 — ABNORMAL HIGH
Bicarbonate: 25.5 — ABNORMAL HIGH
Bicarbonate: 31 — ABNORMAL HIGH
O2 Saturation: 100
O2 Saturation: 98
Patient temperature: 104.2
Patient temperature: 99.2
Patient temperature: 99.5
TCO2: 25
TCO2: 27
TCO2: 32
pCO2 arterial: 28.5 — ABNORMAL LOW
pCO2 arterial: 71.8
pH, Arterial: 7.127 — CL
pH, Arterial: 7.334 — ABNORMAL LOW
pH, Arterial: 7.522 — ABNORMAL HIGH
pO2, Arterial: 99

## 2011-01-13 LAB — GLUCOSE, CAPILLARY
Glucose-Capillary: 102 — ABNORMAL HIGH
Glucose-Capillary: 84
Glucose-Capillary: 89
Glucose-Capillary: 92
Glucose-Capillary: 93

## 2011-01-13 LAB — ETHANOL: Alcohol, Ethyl (B): <5

## 2011-01-13 LAB — BLOOD GAS, ARTERIAL
Acid-base deficit: 8.8 — ABNORMAL HIGH
FIO2: 100
TCO2: 20.4
pCO2 arterial: 63.7
pO2, Arterial: 234 — ABNORMAL HIGH

## 2011-01-13 LAB — BASIC METABOLIC PANEL
CO2: 18 — ABNORMAL LOW
Calcium: 9.1
Chloride: 113 — ABNORMAL HIGH
GFR calc Af Amer: 28 — ABNORMAL LOW
Sodium: 146 — ABNORMAL HIGH

## 2011-01-13 LAB — PROTIME-INR
INR: 1.3
Prothrombin Time: 16.2 — ABNORMAL HIGH

## 2011-01-13 LAB — CARDIAC PANEL(CRET KIN+CKTOT+MB+TROPI)
Relative Index: 0.7
Troponin I: 0.28 — ABNORMAL HIGH
Troponin I: 0.46 — ABNORMAL HIGH

## 2011-01-13 LAB — HEPATIC FUNCTION PANEL
AST: 34
Albumin: 4.4
Total Protein: 8.5 — ABNORMAL HIGH

## 2011-01-13 LAB — URINE MICROSCOPIC-ADD ON

## 2011-01-13 LAB — CULTURE, BLOOD (ROUTINE X 2)

## 2011-01-13 LAB — CBC
MCHC: 32.7
RBC: 5.73
RDW: 13.6

## 2011-01-13 LAB — URINALYSIS, ROUTINE W REFLEX MICROSCOPIC
Bilirubin Urine: NEGATIVE
Glucose, UA: NEGATIVE
Protein, ur: 30 — AB
Urobilinogen, UA: 0.2

## 2011-01-13 LAB — CARBOXYHEMOGLOBIN
Methemoglobin: 1.1
O2 Saturation: 97
O2 Saturation: 98.6
Total hemoglobin: 14.8

## 2011-01-13 LAB — HEPATITIS B SURFACE ANTIGEN: Hepatitis B Surface Ag: NEGATIVE

## 2011-01-13 LAB — URINE CULTURE: Colony Count: NO GROWTH

## 2011-01-13 LAB — ETHYLENE GLYCOL

## 2011-01-13 LAB — OSMOLALITY: Osmolality: 296

## 2011-01-13 LAB — APTT: aPTT: 31

## 2011-01-13 LAB — CREATININE, URINE, RANDOM: Creatinine, Urine: 137.7

## 2011-03-15 ENCOUNTER — Other Ambulatory Visit: Payer: Self-pay | Admitting: *Deleted

## 2011-03-15 DIAGNOSIS — C189 Malignant neoplasm of colon, unspecified: Secondary | ICD-10-CM

## 2011-03-15 MED ORDER — OXYCODONE-ACETAMINOPHEN 5-325 MG PO TABS: 1.0000 | ORAL_TABLET | ORAL | Status: DC | PRN

## 2011-03-15 NOTE — Telephone Encounter (Signed)
Received call from friend, Vinetta Bergamo requesting refill on pt's pain med.

## 2011-04-07 ENCOUNTER — Other Ambulatory Visit: Payer: Self-pay | Admitting: Oncology

## 2011-04-07 ENCOUNTER — Other Ambulatory Visit: Payer: Self-pay | Admitting: *Deleted

## 2011-04-07 ENCOUNTER — Telehealth: Payer: Self-pay | Admitting: Oncology

## 2011-04-07 ENCOUNTER — Other Ambulatory Visit (HOSPITAL_BASED_OUTPATIENT_CLINIC_OR_DEPARTMENT_OTHER): Payer: Medicare Other

## 2011-04-07 DIAGNOSIS — C787 Secondary malignant neoplasm of liver and intrahepatic bile duct: Secondary | ICD-10-CM

## 2011-04-07 DIAGNOSIS — C189 Malignant neoplasm of colon, unspecified: Secondary | ICD-10-CM

## 2011-04-07 DIAGNOSIS — C187 Malignant neoplasm of sigmoid colon: Secondary | ICD-10-CM

## 2011-04-07 DIAGNOSIS — M109 Gout, unspecified: Secondary | ICD-10-CM

## 2011-04-07 DIAGNOSIS — E872 Acidosis: Secondary | ICD-10-CM

## 2011-04-07 DIAGNOSIS — C778 Secondary and unspecified malignant neoplasm of lymph nodes of multiple regions: Secondary | ICD-10-CM

## 2011-04-07 LAB — CBC WITH DIFFERENTIAL/PLATELET
BASO%: 0.2 % (ref 0.0–2.0)
Eosinophils Absolute: 0.1 10*3/uL (ref 0.0–0.5)
LYMPH%: 42.3 % (ref 14.0–49.0)
MCHC: 32.2 g/dL (ref 32.0–36.0)
MONO#: 0.4 10*3/uL (ref 0.1–0.9)
NEUT#: 3.3 10*3/uL (ref 1.5–6.5)
Platelets: 231 10*3/uL (ref 140–400)
RBC: 6.12 10*6/uL — ABNORMAL HIGH (ref 4.20–5.82)
RDW: 13.7 % (ref 11.0–14.6)
WBC: 6.6 10*3/uL (ref 4.0–10.3)
lymph#: 2.8 10*3/uL (ref 0.9–3.3)

## 2011-04-07 LAB — CEA: CEA: 2 ng/mL (ref 0.0–5.0)

## 2011-04-07 LAB — COMPREHENSIVE METABOLIC PANEL
ALT: 19 U/L (ref 0–53)
Albumin: 3.9 g/dL (ref 3.5–5.2)
CO2: 29 mEq/L (ref 19–32)
Glucose, Bld: 124 mg/dL — ABNORMAL HIGH (ref 70–99)
Potassium: 3.9 mEq/L (ref 3.5–5.3)
Sodium: 137 mEq/L (ref 135–145)
Total Bilirubin: 0.8 mg/dL (ref 0.3–1.2)
Total Protein: 8.3 g/dL (ref 6.0–8.3)

## 2011-04-07 MED ORDER — OXYCODONE-ACETAMINOPHEN 5-325 MG PO TABS: 1.0000 | ORAL_TABLET | ORAL | Status: DC | PRN

## 2011-04-07 NOTE — Telephone Encounter (Signed)
Message left on vm at pt's home with [name]'s voice that kidney function is elevated & to stop triamterene 25 mg  & to repeat labs in 2 wks & rx for pain ready for pick up on Monday, 04/10/11.

## 2011-04-07 NOTE — Telephone Encounter (Signed)
gve the pt his march 2013 appt calendar along with the [place] abd appt.

## 2011-04-14 ENCOUNTER — Encounter: Payer: Self-pay | Admitting: *Deleted

## 2011-04-14 NOTE — Telephone Encounter (Deleted)
Pt called regarding results of lab work-called pt at number above-no answer, # busy.

## 2011-04-14 NOTE — Telephone Encounter (Signed)
A user error has taken place: encounter opened in error, closed for administrative reasons.

## 2011-04-21 ENCOUNTER — Other Ambulatory Visit (HOSPITAL_BASED_OUTPATIENT_CLINIC_OR_DEPARTMENT_OTHER): Payer: Medicare Other | Admitting: Lab

## 2011-04-21 ENCOUNTER — Telehealth: Payer: Self-pay | Admitting: *Deleted

## 2011-04-21 DIAGNOSIS — C189 Malignant neoplasm of colon, unspecified: Secondary | ICD-10-CM

## 2011-04-21 DIAGNOSIS — E872 Acidosis: Secondary | ICD-10-CM

## 2011-04-21 LAB — BASIC METABOLIC PANEL
Glucose, Bld: 108 mg/dL — ABNORMAL HIGH (ref 70–99)
Potassium: 4.6 mEq/L (ref 3.5–5.3)
Sodium: 141 mEq/L (ref 135–145)

## 2011-04-21 NOTE — Telephone Encounter (Signed)
Notified pt's friend, Vinetta Bergamo that pt's kidney function improved & to stay off diuretics per Dr Cyndie Chime.

## 2011-05-15 ENCOUNTER — Other Ambulatory Visit: Payer: Self-pay

## 2011-05-15 DIAGNOSIS — C189 Malignant neoplasm of colon, unspecified: Secondary | ICD-10-CM

## 2011-05-15 MED ORDER — OXYCODONE-ACETAMINOPHEN 5-325 MG PO TABS: 1.0000 | ORAL_TABLET | ORAL | Status: DC | PRN

## 2011-05-15 NOTE — Telephone Encounter (Signed)
[name] aware script can be picked up between 830-4p.  dph

## 2011-06-12 ENCOUNTER — Other Ambulatory Visit: Payer: Self-pay | Admitting: *Deleted

## 2011-06-12 ENCOUNTER — Encounter: Payer: Self-pay | Admitting: *Deleted

## 2011-06-12 DIAGNOSIS — C189 Malignant neoplasm of colon, unspecified: Secondary | ICD-10-CM

## 2011-06-12 MED ORDER — OXYCODONE-ACETAMINOPHEN 5-325 MG PO TABS: 1.0000 | ORAL_TABLET | ORAL | Status: DC | PRN

## 2011-06-26 ENCOUNTER — Ambulatory Visit (HOSPITAL_COMMUNITY)
Admission: RE | Admit: 2011-06-26 | Discharge: 2011-06-26 | Disposition: A | Discharge status: Home / Self Care | Payer: Medicare Other | Source: Ambulatory Visit | Attending: Oncology | Admitting: Oncology

## 2011-06-26 DIAGNOSIS — Z9089 Acquired absence of other organs: Secondary | ICD-10-CM | POA: Insufficient documentation

## 2011-06-26 DIAGNOSIS — C189 Malignant neoplasm of colon, unspecified: Secondary | ICD-10-CM

## 2011-06-26 DIAGNOSIS — K7689 Other specified diseases of liver: Secondary | ICD-10-CM | POA: Insufficient documentation

## 2011-07-04 ENCOUNTER — Ambulatory Visit (HOSPITAL_BASED_OUTPATIENT_CLINIC_OR_DEPARTMENT_OTHER): Payer: Medicare Other | Admitting: Oncology

## 2011-07-04 ENCOUNTER — Other Ambulatory Visit (HOSPITAL_BASED_OUTPATIENT_CLINIC_OR_DEPARTMENT_OTHER): Payer: Medicare Other | Admitting: Lab

## 2011-07-04 ENCOUNTER — Other Ambulatory Visit: Payer: Self-pay

## 2011-07-04 ENCOUNTER — Telehealth: Payer: Self-pay | Admitting: Oncology

## 2011-07-04 VITALS — BP 163/100 | HR 92 | Temp 97.2°F | Ht 73.0 in | Wt 272.2 lb

## 2011-07-04 DIAGNOSIS — C189 Malignant neoplasm of colon, unspecified: Secondary | ICD-10-CM

## 2011-07-04 DIAGNOSIS — R7309 Other abnormal glucose: Secondary | ICD-10-CM

## 2011-07-04 DIAGNOSIS — M109 Gout, unspecified: Secondary | ICD-10-CM

## 2011-07-04 DIAGNOSIS — Z85038 Personal history of other malignant neoplasm of large intestine: Secondary | ICD-10-CM

## 2011-07-04 DIAGNOSIS — I10 Essential (primary) hypertension: Secondary | ICD-10-CM

## 2011-07-04 DIAGNOSIS — C787 Secondary malignant neoplasm of liver and intrahepatic bile duct: Secondary | ICD-10-CM

## 2011-07-04 DIAGNOSIS — C778 Secondary and unspecified malignant neoplasm of lymph nodes of multiple regions: Secondary | ICD-10-CM

## 2011-07-04 DIAGNOSIS — C187 Malignant neoplasm of sigmoid colon: Secondary | ICD-10-CM

## 2011-07-04 LAB — CBC WITH DIFFERENTIAL/PLATELET
Basophils Absolute: 0 10*3/uL (ref 0.0–0.1)
Eosinophils Absolute: 0.1 10*3/uL (ref 0.0–0.5)
HCT: 45.7 % (ref 38.4–49.9)
HGB: 15 g/dL (ref 13.0–17.1)
LYMPH%: 58.1 % — ABNORMAL HIGH (ref 14.0–49.0)
MCHC: 32.8 g/dL (ref 32.0–36.0)
MONO#: 0.4 10*3/uL (ref 0.1–0.9)
NEUT%: 34.1 % — ABNORMAL LOW (ref 39.0–75.0)
Platelets: 226 10*3/uL (ref 140–400)
WBC: 6 10*3/uL (ref 4.0–10.3)

## 2011-07-04 LAB — COMPREHENSIVE METABOLIC PANEL WITH GFR
ALT: 22 U/L (ref 0–53)
AST: 24 U/L (ref 0–37)
Albumin: 4.2 g/dL (ref 3.5–5.2)
Alkaline Phosphatase: 56 U/L (ref 39–117)
BUN: 11 mg/dL (ref 6–23)
CO2: 24 meq/L (ref 19–32)
Calcium: 9.4 mg/dL (ref 8.4–10.5)
Chloride: 105 meq/L (ref 96–112)
Creatinine, Ser: 0.95 mg/dL (ref 0.50–1.35)
Glucose, Bld: 91 mg/dL (ref 70–99)
Potassium: 4.2 meq/L (ref 3.5–5.3)
Sodium: 138 meq/L (ref 135–145)
Total Bilirubin: 1.3 mg/dL — ABNORMAL HIGH (ref 0.3–1.2)
Total Protein: 7.3 g/dL (ref 6.0–8.3)

## 2011-07-04 LAB — CEA: CEA: 2.2 ng/mL (ref 0.0–5.0)

## 2011-07-04 LAB — LACTATE DEHYDROGENASE: LDH: 160 U/L (ref 94–250)

## 2011-07-04 MED ORDER — AMLODIPINE BESYLATE 5 MG PO TABS: 5.0000 mg | ORAL_TABLET | Freq: Every day | ORAL | Status: DC

## 2011-07-04 NOTE — Telephone Encounter (Signed)
Gv pt appt schedule for June/sept including appts for ct scan 9/17.

## 2011-07-06 NOTE — Progress Notes (Signed)
Hematology and Oncology Follow Up Visit  Raymond Allen [id number] August 03, 1960 51 y.o. 07/06/2011 9:43 AM   Principle Diagnosis: Encounter Diagnoses  Name Primary?  . Colon cancer metastasized to liver Yes  . HYPERTENSION      Interim History:   This is a red letter day for [name]. He is now out 10 years from a partial hepatectomy for colon cancer initial stage III diagnosed in October 2001 with progression to liver in October 2002. He underwent partial hepatectomy at [facility] in February 2003. He received neoadjuvant chemotherapy with FOLFIRI then adjuvant FOLFOX following the surgery. He was put on maintenance Xeloda. He has been off all chemotherapy since September of 2003. Lab and CT scans have been stable with no obvious signs of recurrent cancer. I have elected to change from CT scans every 6 months to interval 6 month ultrasound of his liver and just annual CT scans at this time. Most recent colonoscopy was done 05/04/2010 when he complained of low-grade rectal bleeding. There were no abnormal findings on the study. And despite this remarkable result, he never feels well. He is always tired. He has intermittent chronic right flank pain which is always been idiopathic. 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  Medications: reviewed  Allergies: No Known Allergies  Review of Systems: Constitutional:   Chronic fatigue Respiratory: No cough or dyspnea Cardiovascular:  No chest pain or palpitations Gastrointestinal: No abdominal pain, change in bowel habit, hematochezia or melena. Genito-Urinary: No urinary tract symptoms Musculoskeletal: Chronic aches and pains nothing specific Neurologic: Intermittent paresthesias and muscle weakness particularly of his right upper extremity with  episodic paralysis Skin: No rash or ecchymosis Remaining ROS negative.  Physical Exam: Blood pressure 163/100, pulse 92, temperature 97.2 F (36.2 C), temperature source Oral, height 6\' 1"  (1.854 m), weight 272 lb 3.2 oz (123.469 kg). Wt Readings from Last 3 Encounters:  07/04/11 272 lb 3.2 oz (123.469 kg)  04/06/08 261 lb (118.389 kg)  03/06/08 257 lb (116.574 kg)     General appearance: Well-nourished African American man [name]: Pharynx no erythema or exudate Lymph nodes: No adenopathy Breasts: Lungs: Clear to auscultation resonant to percussion Heart: Regular rhythm no murmur Abdomen: Soft nontender no mass no organomegaly Extremities: No edema no calf tenderness Vascular: No cyanosis Neurologic: Mental status intact, cranial nerves intact, motor strength currently 5 over 5, reflexes 1+ symmetric. Skin: No rash or ecchymosis  Lab Results: Lab Results  Component Value Date   WBC 6.0 07/04/2011   HGB 15.0 07/04/2011   HCT 45.7 07/04/2011   MCV 79.6 07/04/2011   PLT 226 07/04/2011     Chemistry      Component Value Date/Time   NA 138 07/04/2011 1442   NA 136 06/21/2010 1125   K 4.2 07/04/2011 1442   K 4.5 06/21/2010 1125   CL 105 07/04/2011 1442   CL 96* 06/21/2010 1125   CO2 24 07/04/2011 1442   CO2 30 06/21/2010 1125   BUN 11 07/04/2011 1442   BUN 14 06/21/2010 1125   CREATININE 0.95 07/04/2011 1442   CREATININE 1.2 06/21/2010 1125      Component Value Date/Time   CALCIUM 9.4 07/04/2011 1442   CALCIUM 9.3 06/21/2010 1125   ALKPHOS 56 07/04/2011 1442   ALKPHOS 65 06/21/2010 1125   AST 24 07/04/2011 1442   AST  21 06/21/2010 1125   ALT 22 07/04/2011 1442   BILITOT 1.3* 07/04/2011 1442   BILITOT 1.20 06/21/2010 1125       Radiological Studies: Ultrasound of the abdomen shows fatty infiltration of the liver but no focal abnormalities and no adenopathy   Impression and Plan:   1. Colon cancer metastatic to liver.  He remains free of any obvious progression now out over 10 years from  partial hepatectomy in February 2003. 2. 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 3. Essential hypertension.  He stopped taking his Maxzide at our advice in late December 2012 when his creatinine rose to 1.8. The creatinine now down to normal at 0.95. I am going to start him on a trial of Norvasc 5 mg daily. 4. Borderline elevation of random glucose. Today's value back in normal range and he has lost some weight since last visit.  Observation alone for now.   CC:. Dr.Jyothi Mann; Dr. Cyndia Bent; Dr. Rudolpho Sevin Department of surgery at [facility], MD 3/21/20139:43 AM

## 2011-07-10 ENCOUNTER — Other Ambulatory Visit: Payer: Self-pay

## 2011-07-10 DIAGNOSIS — C189 Malignant neoplasm of colon, unspecified: Secondary | ICD-10-CM

## 2011-07-10 MED ORDER — OXYCODONE-ACETAMINOPHEN 5-325 MG PO TABS: 1.0000 | ORAL_TABLET | ORAL | Status: DC | PRN

## 2011-07-28 ENCOUNTER — Encounter: Payer: Self-pay | Admitting: *Deleted

## 2011-07-28 NOTE — Progress Notes (Signed)
RECEIVED A FAX FROM [facility]ERNING A PRESCRIPTION FOR A VACUUM THERAPY SYSTEM. THIS REQUEST WAS PLACED IN DR.GRANFORTUNA'S ACTIVE WORK INBOX.

## 2011-08-03 ENCOUNTER — Telehealth: Payer: Self-pay | Admitting: *Deleted

## 2011-08-03 NOTE — Telephone Encounter (Signed)
Message left at pt's home # to call back & received return call from [name]/fiance.  Discussed prescription for Vacuum therapy system for erectile dysfunction & per Dr Cyndie Chime, pt needs a urology referral.  She will talk with Fraser Din & see if he is willing to do this.  Called [facility] @ [phone]  X [phone] & spoke with Pattricia Boss & explained that pt needs urology referral.

## 2011-08-11 ENCOUNTER — Other Ambulatory Visit: Payer: Self-pay

## 2011-08-11 DIAGNOSIS — C189 Malignant neoplasm of colon, unspecified: Secondary | ICD-10-CM

## 2011-08-11 MED ORDER — OXYCODONE-ACETAMINOPHEN 5-325 MG PO TABS: 1.0000 | ORAL_TABLET | ORAL | Status: DC | PRN

## 2011-09-08 ENCOUNTER — Other Ambulatory Visit: Payer: Self-pay | Admitting: *Deleted

## 2011-09-08 DIAGNOSIS — C189 Malignant neoplasm of colon, unspecified: Secondary | ICD-10-CM

## 2011-09-08 MED ORDER — OXYCODONE-ACETAMINOPHEN 5-325 MG PO TABS: 1.0000 | ORAL_TABLET | ORAL | Status: DC | PRN

## 2011-10-03 ENCOUNTER — Other Ambulatory Visit (HOSPITAL_BASED_OUTPATIENT_CLINIC_OR_DEPARTMENT_OTHER): Payer: Medicare Other | Admitting: Lab

## 2011-10-03 DIAGNOSIS — C189 Malignant neoplasm of colon, unspecified: Secondary | ICD-10-CM

## 2011-10-03 DIAGNOSIS — C801 Malignant (primary) neoplasm, unspecified: Secondary | ICD-10-CM

## 2011-10-03 DIAGNOSIS — I10 Essential (primary) hypertension: Secondary | ICD-10-CM

## 2011-10-03 DIAGNOSIS — C787 Secondary malignant neoplasm of liver and intrahepatic bile duct: Secondary | ICD-10-CM

## 2011-10-03 DIAGNOSIS — C778 Secondary and unspecified malignant neoplasm of lymph nodes of multiple regions: Secondary | ICD-10-CM

## 2011-10-03 LAB — COMPREHENSIVE METABOLIC PANEL
ALT: 15 U/L (ref 0–53)
AST: 13 U/L (ref 0–37)
Albumin: 4.1 g/dL (ref 3.5–5.2)
Alkaline Phosphatase: 56 U/L (ref 39–117)
Calcium: 9.8 mg/dL (ref 8.4–10.5)
Chloride: 108 mEq/L (ref 96–112)
Potassium: 4 mEq/L (ref 3.5–5.3)

## 2011-10-03 LAB — URINALYSIS, MICROSCOPIC - CHCC
Bilirubin (Urine): NEGATIVE
Blood: NEGATIVE
Leukocyte Esterase: NEGATIVE
Protein: 100 mg/dL
pH: 6 (ref 4.6–8.0)

## 2011-10-04 ENCOUNTER — Telehealth: Payer: Self-pay | Admitting: *Deleted

## 2011-10-04 NOTE — Telephone Encounter (Signed)
Pt notified that u/a normal per Dr Cyndie Chime.  Pt reports that his skin on his face & hands have turned really black.  He doesn't know why & states he hasn't been in the sun a lot.  FYI to Dr Cyndie Chime.

## 2011-10-04 NOTE — Telephone Encounter (Signed)
Message copied by Sabino Snipes on Wed Oct 04, 2011  3:04 PM ------      Message from: Levert Feinstein      Created: Tue Oct 03, 2011  3:58 PM       Call pt urine analysis normal

## 2011-10-09 ENCOUNTER — Telehealth: Payer: Self-pay | Admitting: Medical Oncology

## 2011-10-09 ENCOUNTER — Telehealth: Payer: Self-pay

## 2011-10-09 NOTE — Telephone Encounter (Signed)
Pt not available - I called to follow up his skin  Issue. I left message with his fiance .

## 2011-10-09 NOTE — Telephone Encounter (Signed)
Received message from pt returning a call to this office.  Called pt back, and he states he wanted to make MD aware that "his skin has gotten dark around his knuckles and kneecaps, and his face had gotten dark too, but is normal now."  Pt denies any rash, itching, or irritation to these areas.  Pt states he noticed this about 3 weeks ago.  Pt states his hand also "swole up one time, but went down."  Pt states that was the first time that has ever happened, but they are normal now.  Pt denies any fever or pain or swelling in any extremities.  Informed pt MD is out of the office but note will be left NP upon return to office for her to review, and office will call him back tomorrow.  Pt verbalizes understanding.

## 2011-10-11 ENCOUNTER — Other Ambulatory Visit: Payer: Self-pay

## 2011-10-11 ENCOUNTER — Telehealth: Payer: Self-pay

## 2011-10-11 DIAGNOSIS — C189 Malignant neoplasm of colon, unspecified: Secondary | ICD-10-CM

## 2011-10-11 MED ORDER — OXYCODONE-ACETAMINOPHEN 5-325 MG PO TABS: 1.0000 | ORAL_TABLET | ORAL | Status: DC | PRN

## 2011-10-11 NOTE — Telephone Encounter (Signed)
Received call from pt's fiancee, Lindora, stating that she was returning a call from RN.  Called her back and left message on identified vm, stating per Lendell Caprice, Charity fundraiser, she spoke with Misty Stanley, NP who states pt's skin discoloration should not be a result of why pt is being seen here, and we are not sure what could cause this, but Dr. Reece Agar will be made aware, and if pt develops other symptoms, he can call office back.

## 2011-10-11 NOTE — Telephone Encounter (Signed)
Received call from pt's fiance, Lindora, stating pt needed refill on percocet, and states she did get message left regarding skin issues.  Informed her will speak with Misty Stanley, NP re: refill for pick up, and if there is a problem, office will call her, otherwise, they can pick up.

## 2011-11-10 ENCOUNTER — Other Ambulatory Visit: Payer: Self-pay | Admitting: *Deleted

## 2011-11-10 DIAGNOSIS — C189 Malignant neoplasm of colon, unspecified: Secondary | ICD-10-CM

## 2011-11-10 MED ORDER — OXYCODONE-ACETAMINOPHEN 5-325 MG PO TABS: 1.0000 | ORAL_TABLET | ORAL | Status: DC | PRN

## 2011-11-10 NOTE — Telephone Encounter (Signed)
Called patient and he will come and pick up script.  He would like to sign a release of information form for Lindora while he is here.

## 2011-12-11 ENCOUNTER — Other Ambulatory Visit: Payer: Self-pay | Admitting: *Deleted

## 2011-12-11 ENCOUNTER — Telehealth: Payer: Self-pay | Admitting: *Deleted

## 2011-12-11 DIAGNOSIS — C189 Malignant neoplasm of colon, unspecified: Secondary | ICD-10-CM

## 2011-12-11 MED ORDER — OXYCODONE-ACETAMINOPHEN 5-325 MG PO TABS: 1.0000 | ORAL_TABLET | ORAL | Status: DC | PRN

## 2011-12-11 NOTE — Telephone Encounter (Signed)
Patient called in inquire if his refill is ready for pick up. Made him aware that nurse left him a voice mail that it was ready. Says he does not listen to voice mails, need to call Lindora when his refill is ready. He reports having over 100 messages that he has not listened to and does not have time to listen to all his messages. He admits he did not leave specific info regarding the refill and how to be contacted. Made him aware that Dr. Patsy Lager regular nurse is not always in office and when another is covering desk they are not aware of each patient's particular routines, so he needs to always be specific. He understands and agrees.

## 2011-12-11 NOTE — Telephone Encounter (Signed)
Received call from pt fiance, Raymond Allen, requesting re-fill on percocet for pt.  States she is on the medical release from but this RN could not find.  Called pt and he confirmed that he needs re-fill on pain med and verbally confirmed that Raymond Allen is his fiance and he "filled out form for her to be on the list".  Prescription placed on Dr. Patsy Lager desk for signature.

## 2011-12-11 NOTE — Telephone Encounter (Signed)
Called and left voice message for pt that prescription ready to be picked-up.

## 2012-01-02 ENCOUNTER — Other Ambulatory Visit: Payer: Self-pay

## 2012-01-02 ENCOUNTER — Ambulatory Visit (HOSPITAL_COMMUNITY)
Admission: RE | Admit: 2012-01-02 | Discharge: 2012-01-02 | Disposition: A | Discharge status: Home / Self Care | Payer: Medicare Other | Source: Ambulatory Visit | Attending: Oncology | Admitting: Oncology

## 2012-01-02 ENCOUNTER — Other Ambulatory Visit (HOSPITAL_BASED_OUTPATIENT_CLINIC_OR_DEPARTMENT_OTHER): Payer: Medicare Other

## 2012-01-02 DIAGNOSIS — C787 Secondary malignant neoplasm of liver and intrahepatic bile duct: Secondary | ICD-10-CM

## 2012-01-02 DIAGNOSIS — I10 Essential (primary) hypertension: Secondary | ICD-10-CM

## 2012-01-02 DIAGNOSIS — D7389 Other diseases of spleen: Secondary | ICD-10-CM | POA: Insufficient documentation

## 2012-01-02 DIAGNOSIS — R0602 Shortness of breath: Secondary | ICD-10-CM | POA: Insufficient documentation

## 2012-01-02 DIAGNOSIS — K439 Ventral hernia without obstruction or gangrene: Secondary | ICD-10-CM | POA: Insufficient documentation

## 2012-01-02 DIAGNOSIS — K7689 Other specified diseases of liver: Secondary | ICD-10-CM | POA: Insufficient documentation

## 2012-01-02 DIAGNOSIS — N281 Cyst of kidney, acquired: Secondary | ICD-10-CM | POA: Insufficient documentation

## 2012-01-02 DIAGNOSIS — Y831 Surgical operation with implant of artificial internal device as the cause of abnormal reaction of the patient, or of later complication, without mention of misadventure at the time of the procedure: Secondary | ICD-10-CM | POA: Insufficient documentation

## 2012-01-02 DIAGNOSIS — Z9089 Acquired absence of other organs: Secondary | ICD-10-CM | POA: Insufficient documentation

## 2012-01-02 DIAGNOSIS — Z87891 Personal history of nicotine dependence: Secondary | ICD-10-CM | POA: Insufficient documentation

## 2012-01-02 DIAGNOSIS — C189 Malignant neoplasm of colon, unspecified: Secondary | ICD-10-CM | POA: Insufficient documentation

## 2012-01-02 DIAGNOSIS — C801 Malignant (primary) neoplasm, unspecified: Secondary | ICD-10-CM

## 2012-01-02 DIAGNOSIS — D1779 Benign lipomatous neoplasm of other sites: Secondary | ICD-10-CM | POA: Insufficient documentation

## 2012-01-02 DIAGNOSIS — T82598A Other mechanical complication of other cardiac and vascular devices and implants, initial encounter: Secondary | ICD-10-CM | POA: Insufficient documentation

## 2012-01-02 LAB — COMPREHENSIVE METABOLIC PANEL (CC13)
Albumin: 3.8 g/dL (ref 3.5–5.0)
BUN: 17 mg/dL (ref 7.0–26.0)
Calcium: 9.4 mg/dL (ref 8.4–10.4)
Chloride: 103 mEq/L (ref 98–107)
Glucose: 119 mg/dl — ABNORMAL HIGH (ref 70–99)
Potassium: 4 mEq/L (ref 3.5–5.1)

## 2012-01-02 LAB — CBC WITH DIFFERENTIAL/PLATELET
Basophils Absolute: 0.1 10*3/uL (ref 0.0–0.1)
HCT: 46.6 % (ref 38.4–49.9)
HGB: 15.1 g/dL (ref 13.0–17.1)
MCH: 26.4 pg — ABNORMAL LOW (ref 27.2–33.4)
MONO#: 0.5 10*3/uL (ref 0.1–0.9)
NEUT%: 36.1 % — ABNORMAL LOW (ref 39.0–75.0)
WBC: 5.5 10*3/uL (ref 4.0–10.3)
lymph#: 2.9 10*3/uL (ref 0.9–3.3)

## 2012-01-02 MED ORDER — IOHEXOL 300 MG/ML  SOLN
100.0000 mL | Freq: Once | INTRAMUSCULAR | Status: AC | PRN
  Administered 2012-01-02: 100 mL via INTRAVENOUS

## 2012-01-04 ENCOUNTER — Telehealth: Payer: Self-pay | Admitting: *Deleted

## 2012-01-04 NOTE — Telephone Encounter (Signed)
Message left on pt's mobile vm to call back for results.

## 2012-01-04 NOTE — Telephone Encounter (Signed)
Message copied by Sabino Snipes on Thu Jan 04, 2012 11:40 AM ------      Message from: Levert Feinstein      Created: Wed Jan 03, 2012  8:57 AM       Call pt CT negative; lab OK

## 2012-01-05 ENCOUNTER — Ambulatory Visit (HOSPITAL_BASED_OUTPATIENT_CLINIC_OR_DEPARTMENT_OTHER): Payer: Medicare Other | Admitting: Oncology

## 2012-01-05 ENCOUNTER — Telehealth: Payer: Self-pay | Admitting: *Deleted

## 2012-01-05 ENCOUNTER — Encounter: Payer: Self-pay | Admitting: Oncology

## 2012-01-05 ENCOUNTER — Telehealth: Payer: Self-pay | Admitting: Oncology

## 2012-01-05 VITALS — BP 154/96 | HR 94 | Temp 97.4°F | Resp 20 | Ht 73.0 in | Wt 272.9 lb

## 2012-01-05 DIAGNOSIS — C189 Malignant neoplasm of colon, unspecified: Secondary | ICD-10-CM

## 2012-01-05 DIAGNOSIS — M6281 Muscle weakness (generalized): Secondary | ICD-10-CM

## 2012-01-05 DIAGNOSIS — Z8505 Personal history of malignant neoplasm of liver: Secondary | ICD-10-CM

## 2012-01-05 DIAGNOSIS — Z85038 Personal history of other malignant neoplasm of large intestine: Secondary | ICD-10-CM

## 2012-01-05 DIAGNOSIS — C787 Secondary malignant neoplasm of liver and intrahepatic bile duct: Secondary | ICD-10-CM

## 2012-01-05 HISTORY — DX: Muscle weakness (generalized): M62.81

## 2012-01-05 HISTORY — DX: Secondary malignant neoplasm of liver and intrahepatic bile duct: C18.9

## 2012-01-05 NOTE — Telephone Encounter (Signed)
Message copied by Sabino Snipes on Fri Jan 05, 2012 11:28 AM ------      Message from: Levert Feinstein      Created: Wed Jan 03, 2012  8:57 AM       Call pt CT negative; lab OK

## 2012-01-05 NOTE — Telephone Encounter (Signed)
Pt called for results of CT & labs.  Notified that these were OK & he will see MD today.

## 2012-01-05 NOTE — Progress Notes (Signed)
Hematology and Oncology Follow Up Visit  Raymond Allen [id number] 04-04-1961 51 y.o. 01/05/2012 6:20 PM   Principle Diagnosis: Encounter Diagnoses  Name Primary?  . Colon cancer metastasized to liver Yes  . Muscle weakness-general     Interim History:   Follow visit for this 51 year old man  diagnosed with stage III colon cancer  in October 2001. He received initial adjuvant chemotherapy. He progressed to liver in October 2002. He underwent partial hepatectomy at [facility] in February 2003. He received neoadjuvant chemotherapy with FOLFIRI then adjuvant FOLFOX following the surgery. He was put on maintenance Xeloda. He has been off all chemotherapy since September of 2003. Lab and CT scans have been stable with no obvious signs of recurrent cancer. I have elected to change from CT scans every 6 months to interval 6 month ultrasound of his liver and just annual CT scans at this time. Most recent colonoscopy was done 05/04/2010 when he complained of low-grade rectal bleeding. There were no abnormal findings on the study.  He was restaged in anticipation of today's visit with a CT scan on 01/02/2012 and he remains in a remission now out over 11 years from surgery. Despite this remarkable result, he never feels well. He is always tired. He has intermittent chronic right flank pain which ha and s always been idiopathic. 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 He reports ongoing debilitating weakness. 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 He was unable to tolerate even this simple job due to pain and weakness. He has been experiencing some proximal muscle weakness of his thighs and swelling over his knees bilaterally.  He denies any change in bowel habit.  No hematochezia or melena. Sometimes he sees red color in his stool which he attributes to drinking a lot of Kool-Aid.    Medications: reviewed  Allergies: No Known Allergies  Review of Systems: Constitutional:    chronic fatigue and weakness  Respiratory: no cough or dyspnea  Cardiovascular:   no chest pain or palpitations  Gastrointestinal: see above  Genito-Urinary:  reports he has not had sex in 3 years ; he wanted me to write him a prescription for a penile pump  Musculoskeletal: see above  Neurologic: see above Skin: no rash  Remaining ROS negative.  Physical Exam: Blood pressure 154/96, pulse 94, temperature 97.4 F (36.3 C), temperature source Oral, resp. rate 20, height 6\' 1"  (1.854 m), weight 272 lb 14.4 oz (123.787 kg). Wt Readings from Last 3 Encounters:  01/05/12 272 lb 14.4 oz (123.787 kg)  07/04/11 272 lb 3.2 oz (123.469 kg)  04/06/08 261 lb (118.389 kg)     General appearance:  well-nourished African American man  [name]:  pharynx no erythema or exudate  Lymph nodes:  no adenopathy  Breasts: Lungs: clear to auscultation  Heart: regular rhythm no murmur  Abdomen: soft, nontender, no mass, no organomegaly  Extremities: no edema, no calf tenderness  Vascular: no cyanosis  Neurologic: motor strength is 5 over 5 all extremities upper and lower except for trace weakness of the right upper extremity; Skin: no rash or ecchymosis  Lab Results: Lab Results  Component Value Date   WBC 5.5 01/02/2012   HGB 15.1 01/02/2012   HCT 46.6 01/02/2012   MCV 81.4 01/02/2012  PLT 212 01/02/2012     Chemistry      Component Value Date/Time   NA 136 01/02/2012 1010   NA 140 10/03/2011 1425   NA 136 06/21/2010 1125   K 4.0 01/02/2012 1010   K 4.0 10/03/2011 1425   K 4.5 06/21/2010 1125   CL 103 01/02/2012 1010   CL 108 10/03/2011 1425   CL 96* 06/21/2010 1125   CO2 23 01/02/2012 1010   CO2 25 10/03/2011 1425   CO2 30 06/21/2010 1125   BUN 17.0 01/02/2012 1010   BUN 16 10/03/2011 1425     BUN 14 06/21/2010 1125   CREATININE 1.1 01/02/2012 1010   CREATININE 0.94 10/03/2011 1425   CREATININE 1.2 06/21/2010 1125      Component Value Date/Time   CALCIUM 9.4 01/02/2012 1010   CALCIUM 9.8 10/03/2011 1425   CALCIUM 9.3 06/21/2010 1125   ALKPHOS 65 01/02/2012 1010   ALKPHOS 56 10/03/2011 1425   ALKPHOS 65 06/21/2010 1125   AST 24 01/02/2012 1010   AST 13 10/03/2011 1425   AST 21 06/21/2010 1125   ALT 30 01/02/2012 1010   ALT 15 10/03/2011 1425   BILITOT 1.10 01/02/2012 1010   BILITOT 0.6 10/03/2011 1425   BILITOT 1.20 06/21/2010 1125       Radiological Studies: Dg Chest 2 View  01/02/2012  *RADIOLOGY REPORT*  Clinical Data: Carcinoma of the colon metastatic to liver, post partial hepatectomy, shortness of breath, history hypertension, smoking  CHEST - 2 VIEW  Comparison: 11/30/2007  Findings: Right jugular Port-A-Cath, tip coiled in right jugular vein directed cranially. Minimal enlargement of cardiac silhouette. Tortuous aorta. Pulmonary vascularity normal. Minimal bibasilar atelectasis. Lungs otherwise clear. No pleural effusion or pneumothorax. No acute osseous findings.  IMPRESSION: Minimal enlargement of cardiac silhouette. Minimal bibasilar atelectasis. Chronic malposition of right jugular Port-A-Cath as above.   Original Report Authenticated By: Lollie Marrow, M.D.    Ct Abdomen W Contrast  01/02/2012  *RADIOLOGY REPORT*  Clinical Data: History of metastatic colon cancer status post partial hepatectomy  CT ABDOMEN WITH CONTRAST  Technique:  Multidetector CT imaging of the abdomen was performed following the standard protocol during bolus administration of intravenous contrast.  Contrast: OMNIPAQUE IOHEXOL 300 MG/ML  SOLN  Comparison: 06/21/2010  Findings: No pericardial or pleural effusion identified.  No focal lung opacity noted.  Previous partial left hepatectomy.  There are no specific features to suggest recurrent tumor within the liver parenchyma.  Hepatic steatosis is again noted.   Prior cholecystectomy.  The pancreas appears normal.  Partially calcified cyst within the proximal aspect of the spleen is stable, image 12.  The right adrenal gland is normal.  Normal appearance of the left adrenal gland.  The left kidney appears normal.  Stable cyst arising from the upper pole of the right kidney.  No adenopathy within the upper abdomen.  The stomach appears within normal limits.  The small bowel loops have a normal course and caliber without evidence for obstruction. There are multiple ventral abdominal wall hernias.  These contain a "knuckle " of nonobstructed large bowel, image numbers 52 and 59. No ascites noted within the upper abdomen.  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   Review of the visualized osseous structures shows no worrisome lytic or sclerotic bone lesions.  IMPRESSION:  1.  Stable CT of the upper abdomen. 2.  No evidence of metastatic disease. 3.  Stable appearance of the liver status post left hepatectomy.  Original Report Authenticated By: Rosealee Albee, M.D.     Impression and Plan: 1. Colon cancer metastatic to liver.  He remains free of any obvious progression now out over 11 years from partial hepatectomy in February 2003. 2. 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 3. Essential hypertension.  Borderline control on on Maxzide 6 months ago but I don't see this on his active medicine list. 4.     Borderline elevation of random glucose.  5.     generalized weakness with no specific focal neurologic deficit except for trace weakness of the right         upper extremity.     CC:.  Dr. Loyce Dys Department of surgery [facility]    Levert Feinstein, MD 9/20/20136:20 PM

## 2012-01-05 NOTE — Telephone Encounter (Signed)
gve the pt his march 2014 appt calendar along with the [place] abd appt

## 2012-01-10 ENCOUNTER — Other Ambulatory Visit: Payer: Self-pay | Admitting: *Deleted

## 2012-01-10 DIAGNOSIS — C189 Malignant neoplasm of colon, unspecified: Secondary | ICD-10-CM

## 2012-01-10 MED ORDER — OXYCODONE-ACETAMINOPHEN 5-325 MG PO TABS: 1.0000 | ORAL_TABLET | ORAL | Status: DC | PRN

## 2012-02-08 ENCOUNTER — Other Ambulatory Visit: Payer: Self-pay | Admitting: *Deleted

## 2012-02-08 DIAGNOSIS — C189 Malignant neoplasm of colon, unspecified: Secondary | ICD-10-CM

## 2012-02-08 MED ORDER — OXYCODONE-ACETAMINOPHEN 5-325 MG PO TABS: 1.0000 | ORAL_TABLET | ORAL | Status: DC | PRN

## 2012-02-08 NOTE — Telephone Encounter (Signed)
Received call from pt's friend, Lendor requesting refill on his percocet.  Will get ready for p/u.

## 2012-03-12 ENCOUNTER — Other Ambulatory Visit: Payer: Self-pay | Admitting: *Deleted

## 2012-03-12 DIAGNOSIS — C189 Malignant neoplasm of colon, unspecified: Secondary | ICD-10-CM

## 2012-03-12 MED ORDER — OXYCODONE-ACETAMINOPHEN 5-325 MG PO TABS: 1.0000 | ORAL_TABLET | ORAL | Status: DC | PRN

## 2012-04-08 ENCOUNTER — Other Ambulatory Visit: Payer: Self-pay | Admitting: *Deleted

## 2012-04-08 DIAGNOSIS — C189 Malignant neoplasm of colon, unspecified: Secondary | ICD-10-CM

## 2012-04-08 MED ORDER — OXYCODONE-ACETAMINOPHEN 5-325 MG PO TABS: 1.0000 | ORAL_TABLET | ORAL | Status: DC | PRN

## 2012-04-08 NOTE — Telephone Encounter (Signed)
Wife called requesting refill on  Percocet.  Script ready for pickup.  Left message for wife.

## 2012-05-06 ENCOUNTER — Encounter: Payer: Self-pay | Admitting: *Deleted

## 2012-05-09 ENCOUNTER — Other Ambulatory Visit: Payer: Self-pay | Admitting: *Deleted

## 2012-05-09 DIAGNOSIS — C189 Malignant neoplasm of colon, unspecified: Secondary | ICD-10-CM

## 2012-05-09 MED ORDER — OXYCODONE-ACETAMINOPHEN 5-325 MG PO TABS: 1.0000 | ORAL_TABLET | ORAL | Status: DC | PRN

## 2012-05-09 NOTE — Telephone Encounter (Signed)
Pt's friend, Vinetta Bergamo called for refill on pt's pain med. Notified Vinetta Bergamo that script is ready & she states she will p/u tomorrow.

## 2012-06-10 ENCOUNTER — Other Ambulatory Visit: Payer: Self-pay | Admitting: *Deleted

## 2012-06-10 DIAGNOSIS — C189 Malignant neoplasm of colon, unspecified: Secondary | ICD-10-CM

## 2012-06-10 MED ORDER — OXYCODONE-ACETAMINOPHEN 5-325 MG PO TABS: 1.0000 | ORAL_TABLET | ORAL | Status: DC | PRN

## 2012-06-10 NOTE — Telephone Encounter (Signed)
Called and spoke with Allen Norris (girlfriend-on ROI form) informing her pain script will be ready to pick-up at her convenience.  She verbalized understanding and stated they would pick-up tomorrow 06/11/12.

## 2012-06-11 ENCOUNTER — Telehealth: Payer: Self-pay | Admitting: *Deleted

## 2012-06-11 NOTE — Telephone Encounter (Signed)
Spoke with pt this am and informed pt re: Rx for Percocet will be left with injection nurse for pt to pick up today.

## 2012-07-01 ENCOUNTER — Ambulatory Visit (HOSPITAL_COMMUNITY)
Admission: RE | Admit: 2012-07-01 | Discharge: 2012-07-01 | Disposition: A | Discharge status: Home / Self Care | Payer: Medicare Other | Source: Ambulatory Visit | Attending: Oncology | Admitting: Oncology

## 2012-07-01 ENCOUNTER — Other Ambulatory Visit (HOSPITAL_BASED_OUTPATIENT_CLINIC_OR_DEPARTMENT_OTHER): Payer: Medicare Other | Admitting: Lab

## 2012-07-01 DIAGNOSIS — Z9089 Acquired absence of other organs: Secondary | ICD-10-CM | POA: Insufficient documentation

## 2012-07-01 DIAGNOSIS — Z8505 Personal history of malignant neoplasm of liver: Secondary | ICD-10-CM

## 2012-07-01 DIAGNOSIS — K7689 Other specified diseases of liver: Secondary | ICD-10-CM | POA: Insufficient documentation

## 2012-07-01 DIAGNOSIS — C189 Malignant neoplasm of colon, unspecified: Secondary | ICD-10-CM

## 2012-07-01 DIAGNOSIS — R109 Unspecified abdominal pain: Secondary | ICD-10-CM | POA: Insufficient documentation

## 2012-07-01 DIAGNOSIS — C787 Secondary malignant neoplasm of liver and intrahepatic bile duct: Secondary | ICD-10-CM

## 2012-07-01 DIAGNOSIS — Z85038 Personal history of other malignant neoplasm of large intestine: Secondary | ICD-10-CM

## 2012-07-01 DIAGNOSIS — N281 Cyst of kidney, acquired: Secondary | ICD-10-CM | POA: Insufficient documentation

## 2012-07-01 LAB — CBC WITH DIFFERENTIAL/PLATELET
BASO%: 0.6 % (ref 0.0–2.0)
Basophils Absolute: 0 10*3/uL (ref 0.0–0.1)
EOS%: 1 % (ref 0.0–7.0)
HCT: 50.5 % — ABNORMAL HIGH (ref 38.4–49.9)
HGB: 16.1 g/dL (ref 13.0–17.1)
MCH: 25.9 pg — ABNORMAL LOW (ref 27.2–33.4)
MCHC: 31.9 g/dL — ABNORMAL LOW (ref 32.0–36.0)
MCV: 81.3 fL (ref 79.3–98.0)
MONO%: 6 % (ref 0.0–14.0)
NEUT%: 44.6 % (ref 39.0–75.0)
RDW: 14.2 % (ref 11.0–14.6)

## 2012-07-01 LAB — COMPREHENSIVE METABOLIC PANEL (CC13)
ALT: 28 U/L (ref 0–55)
AST: 21 U/L (ref 5–34)
Alkaline Phosphatase: 75 U/L (ref 40–150)
BUN: 12.7 mg/dL (ref 7.0–26.0)
Creatinine: 1.3 mg/dL (ref 0.7–1.3)

## 2012-07-05 ENCOUNTER — Ambulatory Visit (HOSPITAL_BASED_OUTPATIENT_CLINIC_OR_DEPARTMENT_OTHER): Payer: Medicare Other | Admitting: Oncology

## 2012-07-05 ENCOUNTER — Telehealth: Payer: Self-pay | Admitting: Oncology

## 2012-07-05 VITALS — BP 110/78 | HR 112 | Temp 97.8°F | Resp 20 | Ht 73.0 in | Wt 277.7 lb

## 2012-07-05 DIAGNOSIS — C189 Malignant neoplasm of colon, unspecified: Secondary | ICD-10-CM

## 2012-07-05 DIAGNOSIS — M6281 Muscle weakness (generalized): Secondary | ICD-10-CM

## 2012-07-05 DIAGNOSIS — Z8505 Personal history of malignant neoplasm of liver: Secondary | ICD-10-CM

## 2012-07-05 DIAGNOSIS — I10 Essential (primary) hypertension: Secondary | ICD-10-CM

## 2012-07-05 DIAGNOSIS — Z85038 Personal history of other malignant neoplasm of large intestine: Secondary | ICD-10-CM

## 2012-07-05 DIAGNOSIS — R109 Unspecified abdominal pain: Secondary | ICD-10-CM

## 2012-07-05 NOTE — Telephone Encounter (Signed)
appts made and printed for pt, pt aware cs will call with appt for ct scan...sp

## 2012-07-06 ENCOUNTER — Other Ambulatory Visit: Payer: Self-pay | Admitting: Oncology

## 2012-07-06 DIAGNOSIS — C787 Secondary malignant neoplasm of liver and intrahepatic bile duct: Secondary | ICD-10-CM

## 2012-07-06 NOTE — Progress Notes (Signed)
Hematology and Oncology Follow Up Visit  Raymond Allen [id number] 06/21/1960 51 y.o. 07/06/2012 1:35 PM   Principle Diagnosis: Encounter Diagnoses  Name Primary?  . FLANK PAIN, RIGHT   . Colon cancer metastasized to liver Yes  . HYPERTENSION   . Muscle weakness-general      Interim History:   Follow visit for this 51-year-old man diagnosed with stage III colon cancer in October 2001. He received initial adjuvant chemotherapy. He progressed to liver in October 2002. He underwent partial hepatectomy at [facility] in February 2003. He received neoadjuvant chemotherapy with FOLFIRI then adjuvant FOLFOX following the surgery. He was put on maintenance Xeloda. He has been off all chemotherapy since September of 2003. Lab and CT scans have been stable with no obvious signs of recurrent cancer. I have elected to change from CT scans every 6 months to interval 6 month ultrasound of his liver and just annual CT scans at this time. Most recent colonoscopy was done 05/04/2010 when he complained of low-grade rectal bleeding. There were no abnormal findings on the study. Ultrasound of the liver done in anticipation of today's visit shows fatty changes but no gross evidence for recurrent cancer.    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 He reports ongoing debilitating weakness.  He noticed a swelling in the posterior left neck which concerned him but resolved on its own.  He reports no change in bowel habits, no hematochezia, no melena, no change in his chronic right flank pain which antedated his  cancer diagnosis.   Medications: reviewed  Allergies: No Known Allergies  Review of Systems: Constitutional:   Ongoing fatigue Respiratory: No cough or dyspnea Cardiovascular:  No chest pain or  palpitations Gastrointestinal: See above Genito-Urinary: No urinary tract symptoms Musculoskeletal: See above Neurologic: Intermittent paresthesias no change Skin: No rash Remaining ROS negative.  Physical Exam: Blood pressure 110/78, pulse 112, temperature 97.8 F (36.6 C), temperature source Oral, resp. rate 20, height 6' 1" (1.854 m), weight 277 lb 11.2 oz (125.964 kg). Wt Readings from Last 3 Encounters:  07/05/12 277 lb 11.2 oz (125.964 kg)  01/05/12 272 lb 14.4 oz (123.787 kg)  07/04/11 272 lb 3.2 oz (123.469 kg)     General appearance: Well-nourished African American man  [name]: Pharynx no erythema or exudate; residual subcutaneous soft tissue nodularity posterior left neck from where he had a recent infection. Lymph nodes: No adenopathy Breasts: Lungs: Clear to auscultation resonant to percussion. He still has a Port-A-Cath infusion device in place in the right subclavian position. Heart: Regular rhythm no murmur Abdomen: Soft, nontender, no mass, no organomegaly Extremities: No edema, no calf tenderness Vascular: No cyanosis Neurologic: He is alert and oriented, motor strength 5 over 5, sensation decreased to vibration over the fingertips Skin: No rash or ecchymosis  Lab Results: Lab Results  Component Value Date   WBC 6.7 07/01/2012   HGB 16.1 07/01/2012   HCT 50.5* 07/01/2012   MCV 81.3 07/01/2012   PLT 214 07/01/2012     Chemistry      Component Value Date/Time   NA 139 07/01/2012 1439   NA 140 10/03/2011 1425   NA 136 06/21/2010 1125   K 4.0 07/01/2012 1439   K 4.0 10/03/2011 1425   K 4.5 06/21/2010 1125   CL 102 07/01/2012 1439   CL 108 10/03/2011 1425     CL 96* 06/21/2010 1125   CO2 26 07/01/2012 1439   CO2 25 10/03/2011 1425   CO2 30 06/21/2010 1125   BUN 12.7 07/01/2012 1439   BUN 16 10/03/2011 1425   BUN 14 06/21/2010 1125   CREATININE 1.3 07/01/2012 1439   CREATININE 0.94 10/03/2011 1425   CREATININE 1.2 06/21/2010 1125      Component Value Date/Time   CALCIUM 8.8  07/01/2012 1439   CALCIUM 9.8 10/03/2011 1425   CALCIUM 9.3 06/21/2010 1125   ALKPHOS 75 07/01/2012 1439   ALKPHOS 56 10/03/2011 1425   ALKPHOS 65 06/21/2010 1125   AST 21 07/01/2012 1439   AST 13 10/03/2011 1425   AST 21 06/21/2010 1125   ALT 28 07/01/2012 1439   ALT 15 10/03/2011 1425   BILITOT 1.78* 07/01/2012 1439   BILITOT 0.6 10/03/2011 1425   BILITOT 1.20 06/21/2010 1125       Radiological Studies: Us Abdomen Complete  07/01/2012  *RADIOLOGY REPORT*  Clinical Data:  Abdominal pain.  History of colon cancer with metastatic disease to the liver.  Status post left hepatectomy and cholecystectomy.  COMPLETE ABDOMINAL ULTRASOUND  Comparison:  Abdominal ultrasound 06/26/2011 and CT abdomen and pelvis 01/02/2012.  Findings:  Gallbladder:  Removed.  Common bile duct:  Measures 0.6 cm.  Liver:  Demonstrates coarsened echotexture and increased echogenicity consistent with fatty infiltration. Geographic area of decreased echogenicity at the surgical resection site is unchanged and likely due to suture material and/or asymmetric fat.  IVC:  Appears normal.  Pancreas:  Not visualized.  Spleen:  Measures 5.4 cm.  Mixed echogenicity lesion in the posterior aspect of the spleen is consistent with partially calcified cystic lesion seen on prior CT scan.  Right Kidney:  Measures 11.3 cm.  3.5 cm cyst off the upper pole of the right kidney is unchanged.  No stone or hydronephrosis.  Left Kidney:  Measures 12.5 cm and appears normal.  Abdominal aorta:  No aneurysm identified.  IMPRESSION:  1.  No acute finding. 2.  Heterogeneous steatosis of the liver.  No focal lesion identified.   Original Report Authenticated By: Thomas D'Alessio, M.D.     Impression and Plan: #1. Colon cancer metastatic to liver. He remains free of any obvious new disease now out over 11 years from partial hepatectomy and additional chemotherapy.  #2. 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 No gross neurologic deficits on exam.  #3. Essential hypertension Pressure stable on Norvasc 5 mg daily.  #4. Prediabetes  CC:.    GRANFORTUNA,[name], MD 3/22/20141:35 PM 

## 2012-07-09 ENCOUNTER — Telehealth: Payer: Self-pay | Admitting: Oncology

## 2012-07-09 NOTE — Telephone Encounter (Signed)
S/w the pt and he is aware of the appt to see dr Jamey Ripa on 07/12/2012@11 :00am

## 2012-07-10 ENCOUNTER — Other Ambulatory Visit: Payer: Self-pay | Admitting: *Deleted

## 2012-07-10 DIAGNOSIS — C189 Malignant neoplasm of colon, unspecified: Secondary | ICD-10-CM

## 2012-07-10 MED ORDER — OXYCODONE-ACETAMINOPHEN 5-325 MG PO TABS: 1.0000 | ORAL_TABLET | Freq: Four times a day (QID) | ORAL | Status: DC | PRN

## 2012-07-12 ENCOUNTER — Ambulatory Visit (INDEPENDENT_AMBULATORY_CARE_PROVIDER_SITE_OTHER): Payer: Medicare Other | Admitting: Surgery

## 2012-07-12 ENCOUNTER — Encounter (HOSPITAL_BASED_OUTPATIENT_CLINIC_OR_DEPARTMENT_OTHER): Payer: Self-pay | Admitting: *Deleted

## 2012-07-12 ENCOUNTER — Encounter (INDEPENDENT_AMBULATORY_CARE_PROVIDER_SITE_OTHER): Payer: Self-pay | Admitting: Surgery

## 2012-07-12 VITALS — BP 144/90 | HR 76 | Temp 98.2°F | Resp 16 | Ht 73.0 in | Wt 277.0 lb

## 2012-07-12 DIAGNOSIS — Z95828 Presence of other vascular implants and grafts: Secondary | ICD-10-CM

## 2012-07-12 DIAGNOSIS — Z9889 Other specified postprocedural states: Secondary | ICD-10-CM

## 2012-07-12 DIAGNOSIS — Z85038 Personal history of other malignant neoplasm of large intestine: Secondary | ICD-10-CM

## 2012-07-12 NOTE — Progress Notes (Signed)
NAME: Raymond Allen DOB: February 19, 1961 MRN: [id number]                                                                                      DATE: 07/12/2012  PCP: Levert Feinstein, MD Referring Provider: Levert Feinstein, MD  IMPRESSION:  Unneeded Port-A-Cath, not having been used for the last 5 years History of colon cancer with liver metastases status post colectomy chemotherapy and liver resection, currently no evidence of disease Hypertension  PLAN:   I reviewed removing the Port-A-Cath with him and this is what he would like to do. I told him he'll have to be done under general anesthesia is not certain how easy it will be to remove since it has been in for such a long time. However we should be elicited this as an outpatient. He is agreeable                 CC:  Chief Complaint  Patient presents with  . Vascular Access Problem    Wants port removed    HPI:  Raymond Allen is a 52 y.o.  male who presents for evaluation of removal of a Port-A-Cath. This was placed in around 2003 when he was at [facility] and had a liver resection for a metastatic colon cancer. He has apparently been free of disease. The port is not work or been used for at least 5 years. Release 3 years having some symptoms in his right neck where the port enters the jugular vein and he thinks the port may be causing his discomfort. He and his medical oncologist agreed that it would be good to get this out at this point in time and he comes to discuss that today. Medically he seems to be otherwise stable with no other significant issues.  PMH:  has a past medical history of Colon carcinoma; Hypertension; Cancer; Colon cancer metastasized to liver (01/05/2012); and Muscle weakness-general (01/05/2012).  PSH:   has no past surgical history on file.  ALLERGIES:  No Known Allergies  MEDICATIONS: Current outpatient prescriptions:amLODipine (NORVASC) 5 MG tablet, , Disp: , Rfl: ;  oxyCODONE-acetaminophen  (PERCOCET/ROXICET) 5-325 MG per tablet, Take 1-2 tablets by mouth every 6 (six) hours as needed for pain. 1-2 tab po q 4h prn pain, Disp: 120 tablet, Rfl: 0  ROS: He has filled out our 12 point review of systems and it is negative . EXAM:   VITAL SIGNS: BP 144/90  Pulse 76  Temp(Src) 98.2 F (36.8 C) (Temporal)  Resp 16  Ht 6\' 1"  (1.854 m)  Wt 277 lb (125.646 kg)  BMI 36.55 kg/m2  GENERAL:  The patient is alert, oriented, and generally healthy-appearing, NAD. Mood and affect are normal.  HEENT:  The head is normocephalic, the eyes nonicteric, the pupils were round regular and equal. EOMs are normal. Pharynx normal. Dentition good.  NECK:  The neck is supple and there are no masses or thyromegaly.  LUNGS:  Normal respirations and clear to auscultation.  HEART:  Regular rhythm, with no murmurs rubs or gallops. Pulses are intact carotid dorsalis pedis and posterior tibial. No significant varicosities are noted.  CHEST WALL:the  Port-A-Cath is in place subcutaneous plane the right anterior chest wall. The tubing is palpable in the subcutaneous and goes over the clavicle and her central jugular vein in the neck. There is no evidence infection or inflammatory process.  ABDOMEN:  Soft, flat, and nontender. No masses or organomegaly is noted. No hernias are noted. Bowel sounds are normal.   EXTREMITIES:  Good range of motion, no edema.   DATA REVIEWED:  I have looked over the notes in the electronic medical record.    Darothy Courtright J 07/12/2012  CC: Levert Feinstein, MD, Levert Feinstein, MD

## 2012-07-12 NOTE — Patient Instructions (Signed)
We will schedule outpatient surgery to remove the Port-A-Cath that you no longer need

## 2012-07-16 ENCOUNTER — Encounter (HOSPITAL_BASED_OUTPATIENT_CLINIC_OR_DEPARTMENT_OTHER): Payer: Self-pay | Admitting: Certified Registered Nurse Anesthetist

## 2012-07-16 ENCOUNTER — Encounter (HOSPITAL_BASED_OUTPATIENT_CLINIC_OR_DEPARTMENT_OTHER)
Admission: RE | Disposition: A | Discharge status: Home / Self Care | Payer: Self-pay | Source: Ambulatory Visit | Attending: Surgery

## 2012-07-16 ENCOUNTER — Ambulatory Visit (HOSPITAL_BASED_OUTPATIENT_CLINIC_OR_DEPARTMENT_OTHER)
Admission: RE | Admit: 2012-07-16 | Discharge: 2012-07-16 | Disposition: A | Discharge status: Home / Self Care | Payer: Medicare Other | Source: Ambulatory Visit | Attending: Surgery | Admitting: Surgery

## 2012-07-16 ENCOUNTER — Ambulatory Visit (HOSPITAL_BASED_OUTPATIENT_CLINIC_OR_DEPARTMENT_OTHER): Payer: Medicare Other | Admitting: Certified Registered Nurse Anesthetist

## 2012-07-16 ENCOUNTER — Encounter (HOSPITAL_BASED_OUTPATIENT_CLINIC_OR_DEPARTMENT_OTHER): Payer: Self-pay | Admitting: *Deleted

## 2012-07-16 DIAGNOSIS — I10 Essential (primary) hypertension: Secondary | ICD-10-CM | POA: Insufficient documentation

## 2012-07-16 DIAGNOSIS — R7309 Other abnormal glucose: Secondary | ICD-10-CM | POA: Insufficient documentation

## 2012-07-16 DIAGNOSIS — G8929 Other chronic pain: Secondary | ICD-10-CM | POA: Insufficient documentation

## 2012-07-16 DIAGNOSIS — Z452 Encounter for adjustment and management of vascular access device: Secondary | ICD-10-CM | POA: Insufficient documentation

## 2012-07-16 DIAGNOSIS — R109 Unspecified abdominal pain: Secondary | ICD-10-CM | POA: Insufficient documentation

## 2012-07-16 DIAGNOSIS — R5381 Other malaise: Secondary | ICD-10-CM | POA: Insufficient documentation

## 2012-07-16 DIAGNOSIS — Z9221 Personal history of antineoplastic chemotherapy: Secondary | ICD-10-CM | POA: Insufficient documentation

## 2012-07-16 DIAGNOSIS — Z9089 Acquired absence of other organs: Secondary | ICD-10-CM | POA: Insufficient documentation

## 2012-07-16 DIAGNOSIS — Z85038 Personal history of other malignant neoplasm of large intestine: Secondary | ICD-10-CM | POA: Insufficient documentation

## 2012-07-16 HISTORY — PX: PORT-A-CATH REMOVAL: SHX5289

## 2012-07-16 SURGERY — REMOVAL PORT-A-CATH
Anesthesia: General | Site: Chest | Laterality: Right | Wound class: Clean

## 2012-07-16 MED ORDER — MIDAZOLAM HCL 2 MG/2ML IJ SOLN: 0.5000 mg | Freq: Once | INTRAMUSCULAR | Status: DC | PRN

## 2012-07-16 MED ORDER — PROMETHAZINE HCL 25 MG/ML IJ SOLN: 6.2500 mg | INTRAMUSCULAR | Status: DC | PRN

## 2012-07-16 MED ORDER — MIDAZOLAM HCL 5 MG/5ML IJ SOLN
INTRAMUSCULAR | Status: DC | PRN
  Administered 2012-07-16: 2 mg via INTRAVENOUS

## 2012-07-16 MED ORDER — OXYCODONE HCL 5 MG PO TABS: 5.0000 mg | ORAL_TABLET | Freq: Once | ORAL | Status: DC | PRN

## 2012-07-16 MED ORDER — LACTATED RINGERS IV SOLN
INTRAVENOUS | Status: DC
  Administered 2012-07-16 (×2): via INTRAVENOUS

## 2012-07-16 MED ORDER — FENTANYL CITRATE 0.05 MG/ML IJ SOLN
INTRAMUSCULAR | Status: DC | PRN
  Administered 2012-07-16: 50 ug via INTRAVENOUS

## 2012-07-16 MED ORDER — DEXAMETHASONE SODIUM PHOSPHATE 4 MG/ML IJ SOLN
INTRAMUSCULAR | Status: DC | PRN
  Administered 2012-07-16: 10 mg via INTRAVENOUS

## 2012-07-16 MED ORDER — ONDANSETRON HCL 4 MG/2ML IJ SOLN
INTRAMUSCULAR | Status: DC | PRN
  Administered 2012-07-16: 4 mg via INTRAVENOUS

## 2012-07-16 MED ORDER — LIDOCAINE HCL (PF) 1 % IJ SOLN
INTRAMUSCULAR | Status: DC | PRN
  Administered 2012-07-16: 20 mL

## 2012-07-16 MED ORDER — CHLORHEXIDINE GLUCONATE 4 % EX LIQD: 1.0000 "application " | Freq: Once | CUTANEOUS | Status: DC

## 2012-07-16 MED ORDER — MEPERIDINE HCL 25 MG/ML IJ SOLN: 6.2500 mg | INTRAMUSCULAR | Status: DC | PRN

## 2012-07-16 MED ORDER — OXYCODONE HCL 5 MG/5ML PO SOLN: 5.0000 mg | Freq: Once | ORAL | Status: DC | PRN

## 2012-07-16 MED ORDER — FENTANYL CITRATE 0.05 MG/ML IJ SOLN: 50.0000 ug | INTRAMUSCULAR | Status: DC | PRN

## 2012-07-16 MED ORDER — LIDOCAINE HCL (CARDIAC) 20 MG/ML IV SOLN
INTRAVENOUS | Status: DC | PRN
  Administered 2012-07-16: 60 mg via INTRAVENOUS

## 2012-07-16 MED ORDER — FENTANYL CITRATE 0.05 MG/ML IJ SOLN: 25.0000 ug | INTRAMUSCULAR | Status: DC | PRN

## 2012-07-16 MED ORDER — PROPOFOL 10 MG/ML IV BOLUS
INTRAVENOUS | Status: DC | PRN
  Administered 2012-07-16: 200 mg via INTRAVENOUS

## 2012-07-16 MED ORDER — SUCCINYLCHOLINE CHLORIDE 20 MG/ML IJ SOLN
INTRAMUSCULAR | Status: DC | PRN
  Administered 2012-07-16: 100 mg via INTRAVENOUS

## 2012-07-16 MED ORDER — MIDAZOLAM HCL 2 MG/2ML IJ SOLN: 1.0000 mg | INTRAMUSCULAR | Status: DC | PRN

## 2012-07-16 SURGICAL SUPPLY — 30 items
ADH SKN CLS APL DERMABOND .7 (GAUZE/BANDAGES/DRESSINGS) ×1
BLADE SURG 15 STRL LF DISP TIS (BLADE) ×1 IMPLANT
BLADE SURG 15 STRL SS (BLADE) ×2
CHLORAPREP W/TINT 26ML (MISCELLANEOUS) ×2 IMPLANT
CLOTH BEACON ORANGE TIMEOUT ST (SAFETY) ×2 IMPLANT
COVER MAYO STAND STRL (DRAPES) ×2 IMPLANT
COVER TABLE BACK 60X90 (DRAPES) ×2 IMPLANT
DECANTER SPIKE VIAL GLASS SM (MISCELLANEOUS) ×2 IMPLANT
DERMABOND ADVANCED (GAUZE/BANDAGES/DRESSINGS) ×1
DERMABOND ADVANCED .7 DNX12 (GAUZE/BANDAGES/DRESSINGS) ×1 IMPLANT
DRAPE PED LAPAROTOMY (DRAPES) ×2 IMPLANT
DRAPE UTILITY XL STRL (DRAPES) ×2 IMPLANT
ELECT REM PT RETURN 9FT ADLT (ELECTROSURGICAL) ×2
ELECTRODE REM PT RTRN 9FT ADLT (ELECTROSURGICAL) ×1 IMPLANT
GAUZE SPONGE 4X4 12PLY STRL LF (GAUZE/BANDAGES/DRESSINGS) ×2 IMPLANT
GLOVE BIOGEL PI IND STRL 6.5 (GLOVE) IMPLANT
GLOVE BIOGEL PI INDICATOR 6.5 (GLOVE) ×1
GLOVE ECLIPSE 6.5 STRL STRAW (GLOVE) ×1 IMPLANT
GLOVE EUDERMIC 7 POWDERFREE (GLOVE) ×2 IMPLANT
GOWN PREVENTION PLUS XLARGE (GOWN DISPOSABLE) ×4 IMPLANT
NDL HYPO 25X1 1.5 SAFETY (NEEDLE) ×1 IMPLANT
NEEDLE HYPO 25X1 1.5 SAFETY (NEEDLE) ×2 IMPLANT
PACK BASIN DAY SURGERY FS (CUSTOM PROCEDURE TRAY) ×2 IMPLANT
PENCIL BUTTON HOLSTER BLD 10FT (ELECTRODE) ×2 IMPLANT
SLEEVE SCD COMPRESS KNEE MED (MISCELLANEOUS) ×1 IMPLANT
SUT MNCRL AB 4-0 PS2 18 (SUTURE) ×2 IMPLANT
SUT VICRYL 3-0 CR8 SH (SUTURE) ×2 IMPLANT
SYR CONTROL 10ML LL (SYRINGE) ×2 IMPLANT
TOWEL OR 17X24 6PK STRL BLUE (TOWEL DISPOSABLE) ×4 IMPLANT
TOWEL OR NON WOVEN STRL DISP B (DISPOSABLE) ×2 IMPLANT

## 2012-07-16 NOTE — Anesthesia Preprocedure Evaluation (Signed)
Anesthesia Evaluation  Patient identified by MRN, date of birth, ID band Patient awake    Reviewed: Allergy & Precautions, H&P , NPO status , Patient's Chart, lab work & pertinent test results  History of Anesthesia Complications Negative for: history of anesthetic complications  Airway Mallampati: II TM Distance: >3 FB Neck ROM: Full    Dental  (+) Poor Dentition, Loose, Chipped and Dental Advisory Given   Pulmonary Current Smoker,  breath sounds clear to auscultation  Pulmonary exam normal       Cardiovascular hypertension, Pt. on medications Rhythm:Regular Rate:Normal     Neuro/Psych negative neurological ROS     GI/Hepatic Neg liver ROS, GERD-  Poorly Controlled,History of colon cancer   Endo/Other  Morbid obesity  Renal/GU Renal InsufficiencyRenal disease     Musculoskeletal   Abdominal (+) + obese,   Peds  Hematology   Anesthesia Other Findings   Reproductive/Obstetrics                           Anesthesia Physical Anesthesia Plan  ASA: III  Anesthesia Plan: General   Post-op Pain Management:    Induction: Intravenous and Rapid sequence  Airway Management Planned: Oral ETT  Additional Equipment:   Intra-op Plan:   Post-operative Plan: Extubation in OR  Informed Consent: I have reviewed the patients History and Physical, chart, labs and discussed the procedure including the risks, benefits and alternatives for the proposed anesthesia with the patient or authorized representative who has indicated his/her understanding and acceptance.   Dental advisory given  Plan Discussed with: Surgeon and CRNA  Anesthesia Plan Comments: (Plan routine monitors, GETA)        Anesthesia Quick Evaluation

## 2012-07-16 NOTE — Anesthesia Postprocedure Evaluation (Signed)
  Anesthesia Post-op Note  Patient: Raymond Allen  Procedure(s) Performed: Procedure(s): REMOVAL PORT-A-CATH (Right)  Patient Location: PACU  Anesthesia Type:General  Level of Consciousness: awake, alert , oriented and patient cooperative  Airway and Oxygen Therapy: Patient Spontanous Breathing  Post-op Pain: mild  Post-op Assessment: Post-op Vital signs reviewed, Patient's Cardiovascular Status Stable, Respiratory Function Stable, Patent Airway, No signs of Nausea or vomiting and Pain level controlled  Post-op Vital Signs: Reviewed and stable  Complications: No apparent anesthesia complications

## 2012-07-16 NOTE — Interval H&P Note (Signed)
History and Physical Interval Note:  07/16/2012 7:20 AM  Raymond Allen  has presented today for surgery, with the diagnosis of unneeded pac   The various methods of treatment have been discussed with the patient and family. After consideration of risks, benefits and other options for treatment, the patient has consented to  Procedure(s): REMOVAL PORT-A-CATH (N/A) as a surgical intervention .  The patient's history has been reviewed, patient examined, no change in status, stable for surgery.  I have reviewed the patient's chart and labs.  Questions were answered to the patient's satisfaction.     [name]

## 2012-07-16 NOTE — Transfer of Care (Signed)
Immediate Anesthesia Transfer of Care Note  Patient: Raymond Allen  Procedure(s) Performed: Procedure(s): REMOVAL PORT-A-CATH (Right)  Patient Location: PACU  Anesthesia Type:General  Level of Consciousness: awake, alert , oriented and patient cooperative  Airway & Oxygen Therapy: Patient Spontanous Breathing and Patient connected to face mask oxygen  Post-op Assessment: Report given to PACU RN and Post -op Vital signs reviewed and stable  Post vital signs: Reviewed and stable  Complications: No apparent anesthesia complications

## 2012-07-16 NOTE — Op Note (Signed)
Raymond Allen 1960-07-21 [id number] 07/12/2012  Preoperative diagnosis: Un-Needed PAC  Postoperative diagnosis: Same  Procedure: Portacath Removal  Surgeon: Currie Paris, MD, FACS  Anesthesia:General   Clinical History and Indications: The patient has finished his chemotherapy and no longer needs a port. He wishes to have it removed.  Procedure: The patient was seen in the preoperative area and we confirmed the plans for the procedure as noted above. The Port-A-Cath site was identified and marked. The patient had no further questions.  The patient was then taken into the procedure room. After satisfactory general anesthesia was obtained the patient was clipped, prepped and draped and he timeout was done. The area over the Port-A-Cath was anesthetized with 0.25% marcaine with epinephrineThe old scar was Excised. The capsule around the port opened and the port identified. The holding sutures were cut. The catheter was backed partially out of its tract. A figure 8 3-0 Vicryl suture was placed, the tubing removed, and the suture tied down to prevent backbleeding.  The port was then removed from its pocket. I made sure everything was dry. The incision was closed with 3-0 Vicryl, 4-0 Monocryl subcuticular, and Dermabond.  The patient tolerated the procedure well. There were no complications.  Currie Paris, MD, FACS 07/16/2012 8:03 AM

## 2012-07-16 NOTE — H&P (View-Only) (Signed)
Hematology and Oncology Follow Up Visit  Raymond Allen [id number] 08-May-1960 52 y.o. 07/06/2012 1:35 PM   Principle Diagnosis: Encounter Diagnoses  Name Primary?  Marland Kitchen FLANK PAIN, RIGHT   . Colon cancer metastasized to liver Yes  . HYPERTENSION   . Muscle weakness-general      Interim History:   Follow visit for this 52 year old man diagnosed with stage III colon cancer in October 2001. He received initial adjuvant chemotherapy. He progressed to liver in October 2002. He underwent partial hepatectomy at [facility] in February 2003. He received neoadjuvant chemotherapy with FOLFIRI then adjuvant FOLFOX following the surgery. He was put on maintenance Xeloda. He has been off all chemotherapy since September of 2003. Lab and CT scans have been stable with no obvious signs of recurrent cancer. I have elected to change from CT scans every 6 months to interval 6 month ultrasound of his liver and just annual CT scans at this time. Most recent colonoscopy was done 05/04/2010 when he complained of low-grade rectal bleeding. There were no abnormal findings on the study. Ultrasound of the liver done in anticipation of today's visit shows fatty changes but no gross evidence for recurrent cancer.    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 He reports ongoing debilitating weakness.  He noticed a swelling in the posterior left neck which concerned him but resolved on its own.  He reports no change in bowel habits, no hematochezia, no melena, no change in his chronic right flank pain which antedated his  cancer diagnosis.   Medications: reviewed  Allergies: No Known Allergies  Review of Systems: Constitutional:   Ongoing fatigue Respiratory: No cough or dyspnea Cardiovascular:  No chest pain or  palpitations Gastrointestinal: See above Genito-Urinary: No urinary tract symptoms Musculoskeletal: See above Neurologic: Intermittent paresthesias no change Skin: No rash Remaining ROS negative.  Physical Exam: Blood pressure 110/78, pulse 112, temperature 97.8 F (36.6 C), temperature source Oral, resp. rate 20, height 6\' 1"  (1.854 m), weight 277 lb 11.2 oz (125.964 kg). Wt Readings from Last 3 Encounters:  07/05/12 277 lb 11.2 oz (125.964 kg)  01/05/12 272 lb 14.4 oz (123.787 kg)  07/04/11 272 lb 3.2 oz (123.469 kg)     General appearance: Well-nourished African American man  [name]: Pharynx no erythema or exudate; residual subcutaneous soft tissue nodularity posterior left neck from where he had a recent infection. Lymph nodes: No adenopathy Breasts: Lungs: Clear to auscultation resonant to percussion. He still has a Port-A-Cath infusion device in place in the right subclavian position. Heart: Regular rhythm no murmur Abdomen: Soft, nontender, no mass, no organomegaly Extremities: No edema, no calf tenderness Vascular: No cyanosis Neurologic: He is alert and oriented, motor strength 5 over 5, sensation decreased to vibration over the fingertips Skin: No rash or ecchymosis  Lab Results: Lab Results  Component Value Date   WBC 6.7 07/01/2012   HGB 16.1 07/01/2012   HCT 50.5* 07/01/2012   MCV 81.3 07/01/2012   PLT 214 07/01/2012     Chemistry      Component Value Date/Time   NA 139 07/01/2012 1439   NA 140 10/03/2011 1425   NA 136 06/21/2010 1125   K 4.0 07/01/2012 1439   K 4.0 10/03/2011 1425   K 4.5 06/21/2010 1125   CL 102 07/01/2012 1439   CL 108 10/03/2011 1425  CL 96* 06/21/2010 1125   CO2 26 07/01/2012 1439   CO2 25 10/03/2011 1425   CO2 30 06/21/2010 1125   BUN 12.7 07/01/2012 1439   BUN 16 10/03/2011 1425   BUN 14 06/21/2010 1125   CREATININE 1.3 07/01/2012 1439   CREATININE 0.94 10/03/2011 1425   CREATININE 1.2 06/21/2010 1125      Component Value Date/Time   CALCIUM 8.8  07/01/2012 1439   CALCIUM 9.8 10/03/2011 1425   CALCIUM 9.3 06/21/2010 1125   ALKPHOS 75 07/01/2012 1439   ALKPHOS 56 10/03/2011 1425   ALKPHOS 65 06/21/2010 1125   AST 21 07/01/2012 1439   AST 13 10/03/2011 1425   AST 21 06/21/2010 1125   ALT 28 07/01/2012 1439   ALT 15 10/03/2011 1425   BILITOT 1.78* 07/01/2012 1439   BILITOT 0.6 10/03/2011 1425   BILITOT 1.20 06/21/2010 1125       Radiological Studies: US Abdomen Complete  07/01/2012  *RADIOLOGY REPORT*  Clinical Data:  Abdominal pain.  History of colon cancer with metastatic disease to the liver.  Status post left hepatectomy and cholecystectomy.  COMPLETE ABDOMINAL ULTRASOUND  Comparison:  Abdominal ultrasound 06/26/2011 and CT abdomen and pelvis 01/02/2012.  Findings:  Gallbladder:  Removed.  Common bile duct:  Measures 0.6 cm.  Liver:  Demonstrates coarsened echotexture and increased echogenicity consistent with fatty infiltration. Geographic area of decreased echogenicity at the surgical resection site is unchanged and likely due to suture material and/or asymmetric fat.  IVC:  Appears normal.  Pancreas:  Not visualized.  Spleen:  Measures 5.4 cm.  Mixed echogenicity lesion in the posterior aspect of the spleen is consistent with partially calcified cystic lesion seen on prior CT scan.  Right Kidney:  Measures 11.3 cm.  3.5 cm cyst off the upper pole of the right kidney is unchanged.  No stone or hydronephrosis.  Left Kidney:  Measures 12.5 cm and appears normal.  Abdominal aorta:  No aneurysm identified.  IMPRESSION:  1.  No acute finding. 2.  Heterogeneous steatosis of the liver.  No focal lesion identified.   Original Report Authenticated By: Holley Dexter, M.D.     Impression and Plan: #1. Colon cancer metastatic to liver. He remains free of any obvious new disease now out over 11 years from partial hepatectomy and additional chemotherapy.  #2. 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 No gross neurologic deficits on exam.  #3. Essential hypertension Pressure stable on Norvasc 5 mg daily.  #4. Prediabetes  CC:.    Levert Feinstein, MD 3/22/20141:35 PM

## 2012-07-16 NOTE — Anesthesia Procedure Notes (Signed)
Procedure Name: Intubation Date/Time: 07/16/2012 7:36 AM Performed by: Torey Reinard D Pre-anesthesia Checklist: Patient identified, Emergency Drugs available, Suction available and Patient being monitored Patient Re-evaluated:Patient Re-evaluated prior to inductionOxygen Delivery Method: Circle System Utilized Preoxygenation: Pre-oxygenation with 100% oxygen Intubation Type: IV induction Ventilation: Mask ventilation without difficulty Laryngoscope Size: Mac and 3 Grade View: Grade II Tube type: Oral Tube size: 7.0 mm Number of attempts: 1 Airway Equipment and Method: stylet and oral airway Placement Confirmation: ETT inserted through vocal cords under direct vision,  positive ETCO2 and breath sounds checked- equal and bilateral Secured at: 22 cm Tube secured with: Tape Dental Injury: Teeth and Oropharynx as per pre-operative assessment

## 2012-07-18 ENCOUNTER — Encounter (HOSPITAL_BASED_OUTPATIENT_CLINIC_OR_DEPARTMENT_OTHER): Payer: Self-pay | Admitting: Surgery

## 2012-08-06 ENCOUNTER — Encounter (INDEPENDENT_AMBULATORY_CARE_PROVIDER_SITE_OTHER): Payer: Medicare Other | Admitting: Surgery

## 2012-08-08 ENCOUNTER — Telehealth: Payer: Self-pay | Admitting: *Deleted

## 2012-08-08 ENCOUNTER — Other Ambulatory Visit: Payer: Self-pay | Admitting: Oncology

## 2012-08-08 DIAGNOSIS — C189 Malignant neoplasm of colon, unspecified: Secondary | ICD-10-CM

## 2012-08-08 DIAGNOSIS — I10 Essential (primary) hypertension: Secondary | ICD-10-CM

## 2012-08-08 MED ORDER — OXYCODONE-ACETAMINOPHEN 5-325 MG PO TABS: 1.0000 | ORAL_TABLET | Freq: Four times a day (QID) | ORAL | Status: DC | PRN

## 2012-08-08 MED ORDER — AMLODIPINE BESYLATE 5 MG PO TABS: 5.0000 mg | ORAL_TABLET | Freq: Every day | ORAL | Status: DC

## 2012-08-08 NOTE — Telephone Encounter (Signed)
Received call from pt's girl friend, Vinetta Bergamo requesting refill for [facility] for percocet & BP med.  She will p/u script for percocet tomorrow & BP med will be sent to pharmacy.

## 2012-08-09 ENCOUNTER — Ambulatory Visit (INDEPENDENT_AMBULATORY_CARE_PROVIDER_SITE_OTHER): Payer: Medicare Other | Admitting: Surgery

## 2012-08-09 ENCOUNTER — Encounter (INDEPENDENT_AMBULATORY_CARE_PROVIDER_SITE_OTHER): Payer: Self-pay | Admitting: Surgery

## 2012-08-09 VITALS — BP 154/108 | HR 78 | Temp 98.1°F | Resp 18 | Ht 73.0 in | Wt 276.0 lb

## 2012-08-09 DIAGNOSIS — Z09 Encounter for follow-up examination after completed treatment for conditions other than malignant neoplasm: Secondary | ICD-10-CM

## 2012-08-09 NOTE — Patient Instructions (Signed)
We will see you again on an as needed basis. Please call the office at [phone] if you have any questions or concerns. Thank you for allowing [place] to take care of you. Smoking Cessation Quitting smoking is important to your health and has many advantages. However, it is not always easy to quit since nicotine is a very addictive drug. Often times, people try 3 times or more before being able to quit. This document explains the best ways for you to prepare to quit smoking. Quitting takes hard work and a lot of effort, but you can do it. ADVANTAGES OF QUITTING SMOKING  You will live longer, feel better, and live better.  Your body will feel the impact of quitting smoking almost immediately.  Within 20 minutes, blood pressure decreases. Your pulse returns to its normal level.  After 8 hours, carbon monoxide levels in the blood return to normal. Your oxygen level increases.  After 24 hours, the chance of having a heart attack starts to decrease. Your breath, hair, and body stop smelling like smoke.  After 48 hours, damaged nerve endings begin to recover. Your sense of taste and smell improve.  After 72 hours, the body is virtually free of nicotine. Your bronchial tubes relax and breathing becomes easier.  After 2 to 12 weeks, lungs can hold more air. Exercise becomes easier and circulation improves.  The risk of having a heart attack, stroke, cancer, or lung disease is greatly reduced.  After 1 year, the risk of coronary heart disease is cut in half.  After 5 years, the risk of stroke falls to the same as a nonsmoker.  After 10 years, the risk of lung cancer is cut in half and the risk of other cancers decreases significantly.  After 15 years, the risk of coronary heart disease drops, usually to the level of a nonsmoker.  If you are pregnant, quitting smoking will improve your chances of having a healthy baby.  The people you live with, especially any children, will be healthier.  You  will have extra money to spend on things other than cigarettes. QUESTIONS TO THINK ABOUT BEFORE ATTEMPTING TO QUIT You may want to talk about your answers with your caregiver.  Why do you want to quit?  If you tried to quit in the past, what helped and what did not?  What will be the most difficult situations for you after you quit? How will you plan to handle them?  Who can help you through the tough times? Your family? Friends? A caregiver?  What pleasures do you get from smoking? What ways can you still get pleasure if you quit? Here are some questions to ask your caregiver:  How can you help me to be successful at quitting?  What medicine do you think would be best for me and how should I take it?  What should I do if I need more help?  What is smoking withdrawal like? How can I get information on withdrawal? GET READY  Set a quit date.  Change your environment by getting rid of all cigarettes, ashtrays, matches, and lighters in your home, car, or work. Do not let people smoke in your home.  Review your past attempts to quit. Think about what worked and what did not. GET SUPPORT AND ENCOURAGEMENT You have a better chance of being successful if you have help. You can get support in many ways.  Tell your family, friends, and co-workers that you are going to quit and need  their support. Ask them not to smoke around you.  Get individual, group, or telephone counseling and support. Programs are available at [employer] and health centers. Call your local health department for information about programs in your area.  Spiritual beliefs and practices may help some smokers quit.  Download a "quit meter" on your computer to keep track of quit statistics, such as how long you have gone without smoking, cigarettes not smoked, and money saved.  Get a self-help book about quitting smoking and staying off of tobacco. LEARN NEW SKILLS AND BEHAVIORS  Distract yourself from urges to  smoke. Talk to someone, go for a walk, or occupy your time with a task.  Change your normal routine. Take a different route to work. Drink tea instead of coffee. Eat breakfast in a different place.  Reduce your stress. Take a hot bath, exercise, or read a book.  Plan something enjoyable to do every day. Reward yourself for not smoking.  Explore interactive web-based programs that specialize in helping you quit. GET MEDICINE AND USE IT CORRECTLY Medicines can help you stop smoking and decrease the urge to smoke. Combining medicine with the above behavioral methods and support can greatly increase your chances of successfully quitting smoking.  Nicotine replacement therapy helps deliver nicotine to your body without the negative effects and risks of smoking. Nicotine replacement therapy includes nicotine gum, lozenges, inhalers, nasal sprays, and skin patches. Some may be available over-the-counter and others require a prescription.  Antidepressant medicine helps people abstain from smoking, but how this works is unknown. This medicine is available by prescription.  Nicotinic receptor partial agonist medicine simulates the effect of nicotine in your brain. This medicine is available by prescription. Ask your caregiver for advice about which medicines to use and how to use them based on your health history. Your caregiver will tell you what side effects to look out for if you choose to be on a medicine or therapy. Carefully read the information on the package. Do not use any other product containing nicotine while using a nicotine replacement product.  RELAPSE OR DIFFICULT SITUATIONS Most relapses occur within the first 3 months after quitting. Do not be discouraged if you start smoking again. Remember, most people try several times before finally quitting. You may have symptoms of withdrawal because your body is used to nicotine. You may crave cigarettes, be irritable, feel very hungry, cough often,  get headaches, or have difficulty concentrating. The withdrawal symptoms are only temporary. They are strongest when you first quit, but they will go away within 10 14 days. To reduce the chances of relapse, try to:  Avoid drinking alcohol. Drinking lowers your chances of successfully quitting.  Reduce the amount of caffeine you consume. Once you quit smoking, the amount of caffeine in your body increases and can give you symptoms, such as a rapid heartbeat, sweating, and anxiety.  Avoid smokers because they can make you want to smoke.  Do not let weight gain distract you. Many smokers will gain weight when they quit, usually less than 10 pounds. Eat a healthy diet and stay active. You can always lose the weight gained after you quit.  Find ways to improve your mood other than smoking. FOR MORE INFORMATION  www.smokefree.gov  Document Released: 03/28/2001 Document Revised: 10/03/2011 Document Reviewed: 07/13/2011 [facility] Patient Information 2013 [place], [place].

## 2012-08-09 NOTE — Progress Notes (Signed)
NAME: Raymond Allen                                            DOB: 1960/07/29 DATE: 08/09/2012                                                  MRN: [id number]  CC:  Chief Complaint  Patient presents with  . Routine Post Op    1st po PAC removal 07/16/12    HPI: This patient comes in for post op follow-up .Heunderwent port removal on 07/16/12. He feels that he is doing well.He actually feels better since the prot was removed, but still has ome puling sensation in the right neck which he had related to the port  PE:  VITAL SIGNS: BP 154/108  Pulse 78  Temp(Src) 98.1 F (36.7 C) (Oral)  Resp 18  Ht 6\' 1"  (1.854 m)  Wt 276 lb (125.193 kg)  BMI 36.42 kg/m2  General: The patient appears to be healthy, NAD Incision: Healing nicely  DATA REVIEWED: None  IMPRESSION: The patient is doing well S/P PAC removal.    PLAN: RTC PRN

## 2012-09-10 ENCOUNTER — Telehealth: Payer: Self-pay | Admitting: *Deleted

## 2012-09-10 DIAGNOSIS — C189 Malignant neoplasm of colon, unspecified: Secondary | ICD-10-CM

## 2012-09-10 MED ORDER — OXYCODONE-ACETAMINOPHEN 5-325 MG PO TABS: 1.0000 | ORAL_TABLET | Freq: Four times a day (QID) | ORAL | Status: DC | PRN

## 2012-09-10 NOTE — Telephone Encounter (Signed)
Received call from pt's friend, Vinetta Bergamo, stating that he needs refill on his percocet.  Will notify that script will be ready tomorrow.

## 2012-10-08 ENCOUNTER — Other Ambulatory Visit: Payer: Self-pay | Admitting: *Deleted

## 2012-10-08 DIAGNOSIS — C189 Malignant neoplasm of colon, unspecified: Secondary | ICD-10-CM

## 2012-10-08 MED ORDER — OXYCODONE-ACETAMINOPHEN 5-325 MG PO TABS: ORAL_TABLET | ORAL | Status: DC

## 2012-10-08 MED ORDER — OXYCODONE-ACETAMINOPHEN 5-325 MG PO TABS: 1.0000 | ORAL_TABLET | ORAL | Status: DC | PRN

## 2012-10-08 NOTE — Telephone Encounter (Signed)
Received call from pt's girl friend, Vinetta Bergamo stating that pt needs refill on his percocet.  Printed script & placed on Dr. Patsy Lager desk.  She will p/u tomorrow.

## 2012-11-07 ENCOUNTER — Telehealth: Payer: Self-pay | Admitting: *Deleted

## 2012-11-07 DIAGNOSIS — C189 Malignant neoplasm of colon, unspecified: Secondary | ICD-10-CM

## 2012-11-07 MED ORDER — OXYCODONE-ACETAMINOPHEN 5-325 MG PO TABS: ORAL_TABLET | ORAL | Status: DC

## 2012-11-07 NOTE — Telephone Encounter (Signed)
Received vm call @ 9:27 am from [name]/girl friend asking for refill on pt's percocet

## 2012-12-09 ENCOUNTER — Other Ambulatory Visit: Payer: Self-pay | Admitting: *Deleted

## 2012-12-09 DIAGNOSIS — C189 Malignant neoplasm of colon, unspecified: Secondary | ICD-10-CM

## 2012-12-09 MED ORDER — OXYCODONE-ACETAMINOPHEN 5-325 MG PO TABS: ORAL_TABLET | ORAL | Status: DC

## 2012-12-09 NOTE — Telephone Encounter (Signed)
Notified Vinetta Bergamo (pt's girlfriend) pain script ready for pick-up.  She verbalized understanding.

## 2012-12-25 ENCOUNTER — Telehealth: Payer: Self-pay | Admitting: Oncology

## 2012-12-25 NOTE — Telephone Encounter (Signed)
Calledf pt and left message regarding MD appt being r/s from 9/22 to 10/7, MD on call °

## 2013-01-01 ENCOUNTER — Other Ambulatory Visit (HOSPITAL_COMMUNITY): Payer: Medicare Other

## 2013-01-01 ENCOUNTER — Ambulatory Visit (HOSPITAL_COMMUNITY)
Admission: RE | Admit: 2013-01-01 | Discharge: 2013-01-01 | Disposition: A | Discharge status: Home / Self Care | Payer: Medicare Other | Source: Ambulatory Visit | Attending: Oncology | Admitting: Oncology

## 2013-01-01 ENCOUNTER — Encounter (HOSPITAL_COMMUNITY): Payer: Self-pay

## 2013-01-01 ENCOUNTER — Other Ambulatory Visit (HOSPITAL_BASED_OUTPATIENT_CLINIC_OR_DEPARTMENT_OTHER): Payer: Medicare Other | Admitting: Lab

## 2013-01-01 DIAGNOSIS — K439 Ventral hernia without obstruction or gangrene: Secondary | ICD-10-CM | POA: Insufficient documentation

## 2013-01-01 DIAGNOSIS — C189 Malignant neoplasm of colon, unspecified: Secondary | ICD-10-CM

## 2013-01-01 DIAGNOSIS — R109 Unspecified abdominal pain: Secondary | ICD-10-CM | POA: Insufficient documentation

## 2013-01-01 DIAGNOSIS — I10 Essential (primary) hypertension: Secondary | ICD-10-CM

## 2013-01-01 DIAGNOSIS — Z85038 Personal history of other malignant neoplasm of large intestine: Secondary | ICD-10-CM | POA: Insufficient documentation

## 2013-01-01 DIAGNOSIS — K7689 Other specified diseases of liver: Secondary | ICD-10-CM | POA: Insufficient documentation

## 2013-01-01 DIAGNOSIS — D1779 Benign lipomatous neoplasm of other sites: Secondary | ICD-10-CM | POA: Insufficient documentation

## 2013-01-01 DIAGNOSIS — C787 Secondary malignant neoplasm of liver and intrahepatic bile duct: Secondary | ICD-10-CM

## 2013-01-01 DIAGNOSIS — E279 Disorder of adrenal gland, unspecified: Secondary | ICD-10-CM | POA: Insufficient documentation

## 2013-01-01 DIAGNOSIS — N281 Cyst of kidney, acquired: Secondary | ICD-10-CM | POA: Insufficient documentation

## 2013-01-01 LAB — CBC WITH DIFFERENTIAL/PLATELET
Eosinophils Absolute: 0.2 10*3/uL (ref 0.0–0.5)
MONO#: 0.5 10*3/uL (ref 0.1–0.9)
NEUT#: 1.8 10*3/uL (ref 1.5–6.5)
Platelets: 245 10*3/uL (ref 140–400)
RBC: 5.74 10*6/uL (ref 4.20–5.82)
RDW: 13.5 % (ref 11.0–14.6)
WBC: 6.1 10*3/uL (ref 4.0–10.3)

## 2013-01-01 LAB — COMPREHENSIVE METABOLIC PANEL (CC13)
Albumin: 3.6 g/dL (ref 3.5–5.0)
CO2: 27 mEq/L (ref 22–29)
Glucose: 105 mg/dl (ref 70–140)
Potassium: 4.1 mEq/L (ref 3.5–5.1)
Sodium: 139 mEq/L (ref 136–145)
Total Protein: 7.6 g/dL (ref 6.4–8.3)

## 2013-01-01 LAB — LACTATE DEHYDROGENASE (CC13): LDH: 211 U/L (ref 125–245)

## 2013-01-01 MED ORDER — IOHEXOL 300 MG/ML  SOLN
100.0000 mL | Freq: Once | INTRAMUSCULAR | Status: AC | PRN
  Administered 2013-01-01: 100 mL via INTRAVENOUS

## 2013-01-02 ENCOUNTER — Telehealth: Payer: Self-pay | Admitting: *Deleted

## 2013-01-02 NOTE — Telephone Encounter (Signed)
Notified pt of negative CT & CXR reports per Dr. Cyndie Chime.

## 2013-01-02 NOTE — Telephone Encounter (Signed)
Message copied by Sabino Snipes on Thu Jan 02, 2013  4:28 PM ------      Message from: Levert Feinstein      Created: Thu Jan 02, 2013 11:49 AM       Call pt CXR & CT negative for cancer ------

## 2013-01-06 ENCOUNTER — Ambulatory Visit: Payer: Medicare Other | Admitting: Oncology

## 2013-01-08 ENCOUNTER — Other Ambulatory Visit: Payer: Self-pay | Admitting: *Deleted

## 2013-01-08 DIAGNOSIS — C189 Malignant neoplasm of colon, unspecified: Secondary | ICD-10-CM

## 2013-01-08 MED ORDER — OXYCODONE-ACETAMINOPHEN 5-325 MG PO TABS: ORAL_TABLET | ORAL | Status: DC

## 2013-01-08 NOTE — Telephone Encounter (Signed)
Lindora called and left message that Abrham needed a refill of his percocet Called Lindora and let her know that script will be with the injection nurse.  She appreciated the call.

## 2013-01-21 ENCOUNTER — Telehealth: Payer: Self-pay | Admitting: Oncology

## 2013-01-21 ENCOUNTER — Ambulatory Visit (HOSPITAL_BASED_OUTPATIENT_CLINIC_OR_DEPARTMENT_OTHER): Payer: Medicare Other | Admitting: Oncology

## 2013-01-21 VITALS — BP 144/91 | HR 100 | Temp 97.6°F | Resp 18 | Ht 73.0 in | Wt 265.8 lb

## 2013-01-21 DIAGNOSIS — Z8505 Personal history of malignant neoplasm of liver: Secondary | ICD-10-CM

## 2013-01-21 DIAGNOSIS — C189 Malignant neoplasm of colon, unspecified: Secondary | ICD-10-CM

## 2013-01-21 DIAGNOSIS — I10 Essential (primary) hypertension: Secondary | ICD-10-CM

## 2013-01-21 DIAGNOSIS — R7309 Other abnormal glucose: Secondary | ICD-10-CM

## 2013-01-21 DIAGNOSIS — Z85038 Personal history of other malignant neoplasm of large intestine: Secondary | ICD-10-CM

## 2013-01-21 NOTE — Telephone Encounter (Signed)
appts sched and pt aware pt reminded CS will call to schedule [place]  shh
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
## 2013-02-06 ENCOUNTER — Other Ambulatory Visit: Payer: Self-pay | Admitting: Oncology

## 2013-02-06 ENCOUNTER — Other Ambulatory Visit: Payer: Self-pay | Admitting: *Deleted

## 2013-02-06 DIAGNOSIS — C189 Malignant neoplasm of colon, unspecified: Secondary | ICD-10-CM

## 2013-02-06 MED ORDER — OXYCODONE-ACETAMINOPHEN 5-325 MG PO TABS: ORAL_TABLET | ORAL | Status: DC

## 2013-02-06 NOTE — Telephone Encounter (Signed)
Notified pt's wife pain script ready for p/u at her convenience.  She verbalized understanding.

## 2013-03-10 ENCOUNTER — Other Ambulatory Visit: Payer: Self-pay | Admitting: *Deleted

## 2013-03-10 DIAGNOSIS — C189 Malignant neoplasm of colon, unspecified: Secondary | ICD-10-CM

## 2013-03-10 MED ORDER — OXYCODONE-ACETAMINOPHEN 5-325 MG PO TABS: ORAL_TABLET | ORAL | Status: DC

## 2013-03-10 NOTE — Telephone Encounter (Signed)
Received vm call from pt's girlfriend requesting refill on pt's pain med.  Will get script ready.

## 2013-04-07 ENCOUNTER — Other Ambulatory Visit: Payer: Self-pay | Admitting: *Deleted

## 2013-04-07 DIAGNOSIS — C189 Malignant neoplasm of colon, unspecified: Secondary | ICD-10-CM

## 2013-04-07 MED ORDER — OXYCODONE-ACETAMINOPHEN 5-325 MG PO TABS: ORAL_TABLET | ORAL | Status: DC

## 2013-04-07 NOTE — Telephone Encounter (Signed)
Notified pt's wife; pain script ready for pick-up at her convenience.  Pt's wife verbalized understanding.

## 2013-04-11 ENCOUNTER — Other Ambulatory Visit: Payer: Self-pay | Admitting: Oncology

## 2013-04-11 DIAGNOSIS — I10 Essential (primary) hypertension: Secondary | ICD-10-CM

## 2013-04-11 DIAGNOSIS — C189 Malignant neoplasm of colon, unspecified: Secondary | ICD-10-CM

## 2013-05-09 ENCOUNTER — Other Ambulatory Visit: Payer: Self-pay | Admitting: *Deleted

## 2013-05-09 DIAGNOSIS — C189 Malignant neoplasm of colon, unspecified: Secondary | ICD-10-CM

## 2013-05-09 MED ORDER — OXYCODONE-ACETAMINOPHEN 5-325 MG PO TABS: ORAL_TABLET | ORAL | Status: DC

## 2013-06-10 ENCOUNTER — Other Ambulatory Visit: Payer: Self-pay | Admitting: *Deleted

## 2013-06-10 DIAGNOSIS — C189 Malignant neoplasm of colon, unspecified: Secondary | ICD-10-CM

## 2013-06-10 MED ORDER — OXYCODONE-ACETAMINOPHEN 5-325 MG PO TABS: ORAL_TABLET | ORAL | Status: DC

## 2013-06-10 NOTE — Telephone Encounter (Signed)
Notified patient's girlfriend Lindora (ROI on file) that pain script ready for pick/up at their convenience.  Lindora verbalized understanding and expressed appreciation for call back.

## 2013-06-14 ENCOUNTER — Encounter: Payer: Self-pay | Admitting: Oncology

## 2013-06-14 ENCOUNTER — Telehealth: Payer: Self-pay | Admitting: *Deleted

## 2013-06-14 NOTE — Telephone Encounter (Signed)
Mailed provider change letter w/ calendar to pt and cancelled Dr. G's appt. 

## 2013-07-09 ENCOUNTER — Other Ambulatory Visit: Payer: Self-pay | Admitting: *Deleted

## 2013-07-09 DIAGNOSIS — C189 Malignant neoplasm of colon, unspecified: Secondary | ICD-10-CM

## 2013-07-09 MED ORDER — OXYCODONE-ACETAMINOPHEN 5-325 MG PO TABS: ORAL_TABLET | ORAL | Status: DC

## 2013-07-09 NOTE — Telephone Encounter (Signed)
Per Dr. Julien Nordmann, notified pt's wife that MD will fill pain script dispensing #30 until pt is seen in office on 07/29/13 for evaluation.  Pt's wife verbalized understanding.

## 2013-07-09 NOTE — Telephone Encounter (Signed)
Received call from pt very upset stating "I'm a 4 time cancer survivor; my hands get numb and my feet get numb; Dr. Beryle Beams put me out of work for a reason; 30 pills will not last me a week; I have never seen Dr. Julien Nordmann; someone needs to get in touch with Dr. Beryle Beams so I can get my medicine!"  Explained to pt that Dr. Beryle Beams is no longer here and Dr. Julien Nordmann is being generous in giving you #30 during this transition, when MD has not seen pt yet, until pt appt 4/14 and establishes care with him.  Pt yelling over the phone and continues to repeat earlier comments "I don't know Dr. Julien Nordmann and I need my pain medicine just like before" Explained to pt that Dr. Julien Nordmann will not give any more pain medicine until he sees pt in office.  Pt continues to repeat situation again, yelling at this RN. Explained to pt again that there is a pain script ready for him to pick up and if his pain is uncontrolled; go to the ED.   Informed pt that his contact information will be given to Dollene Primrose our Director and he will contact pt regarding this matter.  Dr. Julien Nordmann made aware of situation.

## 2013-07-14 ENCOUNTER — Telehealth: Payer: Self-pay | Admitting: *Deleted

## 2013-07-14 NOTE — Telephone Encounter (Signed)
Pt called back and was very upset.  A rx has been ready for pick-up since 07/09/13 but he is angry that Dr Julien Nordmann will not refill his pain medication the way Dr Beryle Beams would prescribe (Qty 120 tablets).  Pt states he does not want to Dr Julien Nordmann if he will not give him the medication.  Tried to explain that Dr Vista Mink does not know him and will not give a qty over 30 tablets at this time.  Informed him that we will let Dollene Primrose know so he can be reassigned.  Called and left msg with Merry Proud.  SLJ

## 2013-07-14 NOTE — Telephone Encounter (Signed)
error 

## 2013-07-14 NOTE — Telephone Encounter (Signed)
Raymond Allen called stating that pt is upset that Raymond Allen will not give pt a larger qty of his pain medication before seeing the patient.  Pt has rx for oxycodone qty 30 ready for pick up but pt as of right now is refusing to pick it up.  He stated he has not received a call back about this and is upset that no one has called.  Informed Raymond Allen that we will give him a call back.  Per Raymond Raymond Allen, he will not give a larger qty until he sees the pt.  Attempted to ph #, left vm to call us back.  SLJ

## 2013-07-15 ENCOUNTER — Telehealth: Payer: Self-pay | Admitting: *Deleted

## 2013-07-15 NOTE — Telephone Encounter (Signed)
Pt's girlfriend Raymond Allen called stating that pt wanted her to call because he did not say yesterday that Dr Darnell Level was his PCP and that is why he was prescribing his pain medication.  Informed her that he and Raymond Allen discussed this yesterday and they will be determining what the plan will be for who pt is going to see at [facility].  Informed her that just because Dr Darnell Level was his PCP does not mean that other physicians practice the same way.  She verbalized understanding.  SLJ

## 2013-07-22 ENCOUNTER — Other Ambulatory Visit: Payer: Medicare Other

## 2013-07-22 ENCOUNTER — Ambulatory Visit (HOSPITAL_COMMUNITY): Admission: RE | Admit: 2013-07-22 | Payer: Medicare Other | Source: Ambulatory Visit

## 2013-07-28 ENCOUNTER — Telehealth: Payer: Self-pay | Admitting: Hematology and Oncology

## 2013-07-28 NOTE — Telephone Encounter (Signed)
S/W PATIENT FRIEND(LINDORA) AND GAVE NEW DATE/TIME FOR APPT  04/15 @ 1 W/DR. [place].

## 2013-07-29 ENCOUNTER — Ambulatory Visit: Payer: Medicare Other | Admitting: Internal Medicine

## 2013-07-29 ENCOUNTER — Ambulatory Visit: Payer: Medicare Other | Admitting: Oncology

## 2013-07-30 ENCOUNTER — Other Ambulatory Visit: Payer: Self-pay | Admitting: Emergency Medicine

## 2013-07-30 ENCOUNTER — Other Ambulatory Visit: Payer: Self-pay | Admitting: Hematology and Oncology

## 2013-07-30 ENCOUNTER — Telehealth: Payer: Self-pay | Admitting: Hematology and Oncology

## 2013-07-30 ENCOUNTER — Telehealth: Payer: Self-pay | Admitting: Emergency Medicine

## 2013-07-30 ENCOUNTER — Ambulatory Visit (HOSPITAL_BASED_OUTPATIENT_CLINIC_OR_DEPARTMENT_OTHER): Payer: Medicare Other | Admitting: Hematology and Oncology

## 2013-07-30 ENCOUNTER — Encounter: Payer: Self-pay | Admitting: Hematology and Oncology

## 2013-07-30 VITALS — BP 157/95 | HR 82 | Temp 97.7°F | Resp 19 | Ht 73.0 in | Wt 281.6 lb

## 2013-07-30 DIAGNOSIS — C787 Secondary malignant neoplasm of liver and intrahepatic bile duct: Principal | ICD-10-CM

## 2013-07-30 DIAGNOSIS — N2889 Other specified disorders of kidney and ureter: Secondary | ICD-10-CM

## 2013-07-30 DIAGNOSIS — I10 Essential (primary) hypertension: Secondary | ICD-10-CM

## 2013-07-30 DIAGNOSIS — R109 Unspecified abdominal pain: Secondary | ICD-10-CM

## 2013-07-30 DIAGNOSIS — N289 Disorder of kidney and ureter, unspecified: Secondary | ICD-10-CM

## 2013-07-30 DIAGNOSIS — C189 Malignant neoplasm of colon, unspecified: Secondary | ICD-10-CM

## 2013-07-30 DIAGNOSIS — Z85038 Personal history of other malignant neoplasm of large intestine: Secondary | ICD-10-CM

## 2013-07-30 MED ORDER — OXYCODONE-ACETAMINOPHEN 5-325 MG PO TABS: ORAL_TABLET | ORAL | Status: DC

## 2013-07-30 MED ORDER — AMLODIPINE BESYLATE 5 MG PO TABS: 5.0000 mg | ORAL_TABLET | Freq: Every day | ORAL | Status: DC

## 2013-07-30 NOTE — Telephone Encounter (Signed)
[facility], they want recors of patient , gave referral to HIM, called [facility] , pt will see Dr. Kelton Pillar Internal medicine , gave HIM referral for notes, they will call pt with dat as soon s they receive notes

## 2013-07-30 NOTE — Progress Notes (Signed)
[place] FOLLOW-UP progress notes  Patient Care Team: Annia Belt, MD as PCP - General (Hematology and Oncology)  CHIEF COMPLAINTS/PURPOSE OF VISIT:  History of metastatic colon cancer, no evidence of disease  HISTORY OF PRESENTING ILLNESS:  Raymond Allen 53 y.o. male was transferred to my care after his prior physician has left.  I reviewed the patient's records extensive and collaborated the history with the patient. Summary of his history is as follows: this 53 year old man was diagnosed with stage III colon cancer in October 2001. He received initial adjuvant chemotherapy. He progressed to liver in October 2002. He underwent partial hepatectomy at [facility] in February 2003. He received neoadjuvant chemotherapy with FOLFIRI then adjuvant FOLFOX following the surgery. He was put on maintenance Xeloda. He has been off all chemotherapy since September of 2003. Lab and CT scans have been stable with no obvious signs of recurrent cancer. I have elected to change from CT scans every 6 months to interval 6 month ultrasound of his liver and  annual CT scans . Most recent colonoscopy was done 05/04/2010 when he complained of low-grade rectal bleeding. There were no abnormal findings on the study.  The patient is very upset today that his prior oncologist has left. He does not has a primary care provider. He is very upset that nobody would write him chronic pain medications for right upper quadrant pain, which has been present since his surgery 12 years ago. The pain is described as a sharp pain that comes and goes. There was no precipitating factors. Sometimes, it is aggravated by activities. The patient insists that he is not a chronic narcotic user. He denies any change in bowel habits. His appetite is stable and he has gained some weight since he was last seen here. MEDICAL HISTORY:  Past Medical History  Diagnosis Date  . Colon carcinoma     colon ca dx 2001;  .  Hypertension   . Cancer   . Colon cancer metastasized to liver 01/05/2012  . Muscle weakness-general 01/05/2012    SURGICAL HISTORY: Past Surgical History  Procedure Laterality Date  . Cholecystectomy    . Liver surgery      partial removal [facility] in 2003  . Colon surgery      sigmoid colectomy, appendectomy  . Port-a-cath removal Right 07/16/2012    Procedure: REMOVAL PORT-A-CATH;  Surgeon: Haywood Lasso, MD;  Location: [place];  Service: General;  Laterality: Right;    SOCIAL HISTORY: History   Social History  . Marital Status: Single    Spouse Name: N/A    Number of Children: N/A  . Years of Education: N/A   Occupational History  . Not on file.   Social History Main Topics  . Smoking status: Current Every Day Smoker -- 0.50 packs/day    Types: Cigarettes  . Smokeless tobacco: Never Used  . Alcohol Use: No  . Drug Use: No  . Sexual Activity: Not on file   Other Topics Concern  . Not on file   Social History Narrative  . No narrative on file    FAMILY HISTORY: No family history on file.  ALLERGIES:  has No Known Allergies.  MEDICATIONS:  Current Outpatient Prescriptions  Medication Sig Dispense Refill  . amLODipine (NORVASC) 5 MG tablet Take 1 tablet (5 mg total) by mouth daily.  30 tablet  1  . oxyCODONE-acetaminophen (PERCOCET/ROXICET) 5-325 MG per tablet Take 1 tab every 6 hours/prn pain  30 tablet  0   No current facility-administered medications for this visit.    REVIEW OF SYSTEMS:   Constitutional: Denies fevers, chills or abnormal night sweats Eyes: Denies blurriness of vision, double vision or watery eyes Ears, nose, mouth, throat, and face: Denies mucositis or sore throat Respiratory: Denies cough, dyspnea or wheezes Cardiovascular: Denies palpitation, chest discomfort or lower extremity swelling Gastrointestinal:  Denies nausea, heartburn or change in bowel habits Skin: Denies abnormal skin rashes Lymphatics: Denies new  lymphadenopathy or easy bruising Neurological:Denies numbness, tingling or new weaknesses Behavioral/Psych: Mood is stable, no new changes  All other systems were reviewed with the patient and are negative.  PHYSICAL EXAMINATION: ECOG PERFORMANCE STATUS: 1 - Symptomatic but completely ambulatory  Filed Vitals:   07/30/13 1324  BP: 157/95  Pulse: 82  Temp: 97.7 F (36.5 C)  Resp: 19   Filed Weights   07/30/13 1324  Weight: 281 lb 9.6 oz (127.733 kg)    GENERAL:alert, no distress and comfortable. He is morbidly obese SKIN: skin color, texture, turgor are normal, no rashes or significant lesions EYES: normal, conjunctiva are pink and non-injected, sclera clear OROPHARYNX:no exudate, normal lips, buccal mucosa, and tongue  NECK: supple, thyroid normal size, non-tender, without nodularity LYMPH:  no palpable lymphadenopathy in the cervical, axillary or inguinal LUNGS: clear to auscultation and percussion with normal breathing effort HEART: regular rate & rhythm and no murmurs without lower extremity edema ABDOMEN:abdomen soft, non-tender and normal bowel sounds. Well-healed surgical scar. No palpable abnormalities Musculoskeletal:no cyanosis of digits and no clubbing  PSYCH: alert & oriented x 3 with fluent speech NEURO: no focal motor/sensory deficits  LABORATORY DATA:  I have reviewed the data as listed Lab Results  Component Value Date   WBC 6.1 01/01/2013   HGB 15.0 01/01/2013   HCT 46.6 01/01/2013   MCV 81.1 01/01/2013   PLT 245 01/01/2013    Recent Labs  01/01/13 1328  NA 139  K 4.1  CO2 27  GLUCOSE 105  BUN 14.5  CREATININE 1.2  CALCIUM 9.5  PROT 7.6  ALBUMIN 3.6  AST 40*  ALT 27  ALKPHOS 63  BILITOT 1.39*    RADIOGRAPHIC STUDIES: I reviewed his last imaging study dated 01/01/2013 which show no evidence of disease recurrence. I have personally reviewed the radiological images as listed and agreed with the findings in the report.  ASSESSMENT & PLAN:  #1  history of recurrent colon cancer to the liver status post resection and chemotherapy, no evidence of disease The patient is cured of the disease. His last treatment is more than 12 years. He can be discharged from the clinic. #2 chronic, intermittent right upper quadrant pain #3 kidney mass He has a kidney lesion present for over 5 years with no change. He also was noted to have kidney stones. I wonder if the pain could be related to this and recommend urology consultation. I told him that I would not write him chronic long-term pain medications for what I perceive might be neuropathic pain from surgery. Instead I recommended Neurontin. The patient does not want Neurontin. He wanted to be treated at the same way he was treated by his prior oncologist and insisted that he would not abuse pain medications. I tried my very best to explain to him it is not that I perceive him as a chronic narcotic abuser, rather, it is my personal choice of practice that I would not prescribe chronic narcotic prescriptions for patient who does not have terminal cancer. At this  point in time, the patient is very angry and upset over the whole visit. I offered to discusse his case with the administrator to see if there is any way we can get him to follow with his prior oncologist. The patient ultimately declined pain medication and wanted to be referred to an internist for chronic management.  Orders Placed This Encounter  Procedures  . Ambulatory referral to Internal Medicine    Referral Priority:  Routine    Referral Type:  Consultation    Referral Reason:  Specialty Services Required    Requested Specialty:  Internal Medicine    Number of Visits Requested:  1  . Ambulatory referral to Urology    Referral Priority:  Routine    Referral Type:  Consultation    Referral Reason:  Specialty Services Required    Requested Specialty:  Urology    Number of Visits Requested:  1    All questions were answered. The  patient knows to call the clinic with any problems, questions or concerns. I spent 40 minutes counseling the patient face to face. The total time spent in the appointment was 55 minutes and more than 50% was on counseling.     Heath Lark, MD 07/30/2013 8:15 PM

## 2013-07-30 NOTE — Telephone Encounter (Signed)
Pt called in requesting blood pressure refill until establish care appt Amlodipine 5 mg e-scribed to [facility] with #[facility] supply,1 refill New pt appt 09/18/13 with Dr. Annitta Needs @ 930 am

## 2013-07-31 ENCOUNTER — Telehealth: Payer: Self-pay | Admitting: Hematology and Oncology

## 2013-07-31 NOTE — Telephone Encounter (Signed)
Faxed pt medical records to Internal medicine

## 2013-07-31 NOTE — Telephone Encounter (Signed)
Faxed pt medical records to [facility] °

## 2013-07-31 NOTE — Telephone Encounter (Signed)
Called pt and left message regarding referral to Internal Medicine and [facility], records has been faxed

## 2013-08-14 ENCOUNTER — Telehealth: Payer: Self-pay | Admitting: Hematology and Oncology

## 2013-08-14 NOTE — Telephone Encounter (Signed)
Faxed pt medical records to [facility] and ordered scans to be fedex'ed

## 2013-09-18 ENCOUNTER — Ambulatory Visit: Payer: Medicare Other | Admitting: Internal Medicine

## 2013-11-07 ENCOUNTER — Other Ambulatory Visit: Payer: Self-pay | Admitting: Internal Medicine

## 2013-12-03 ENCOUNTER — Encounter (HOSPITAL_COMMUNITY): Payer: Self-pay | Admitting: Emergency Medicine

## 2013-12-03 ENCOUNTER — Emergency Department (HOSPITAL_COMMUNITY)
Admission: EM | Admit: 2013-12-03 | Discharge: 2013-12-03 | Disposition: A | Discharge status: Home / Self Care | Payer: Medicare Other | Attending: Emergency Medicine | Admitting: Emergency Medicine

## 2013-12-03 DIAGNOSIS — Z8505 Personal history of malignant neoplasm of liver: Secondary | ICD-10-CM | POA: Diagnosis not present

## 2013-12-03 DIAGNOSIS — Y92410 Unspecified street and highway as the place of occurrence of the external cause: Secondary | ICD-10-CM | POA: Diagnosis not present

## 2013-12-03 DIAGNOSIS — Z79899 Other long term (current) drug therapy: Secondary | ICD-10-CM | POA: Insufficient documentation

## 2013-12-03 DIAGNOSIS — F17200 Nicotine dependence, unspecified, uncomplicated: Secondary | ICD-10-CM | POA: Diagnosis not present

## 2013-12-03 DIAGNOSIS — Z85038 Personal history of other malignant neoplasm of large intestine: Secondary | ICD-10-CM | POA: Diagnosis not present

## 2013-12-03 DIAGNOSIS — I10 Essential (primary) hypertension: Secondary | ICD-10-CM | POA: Diagnosis not present

## 2013-12-03 DIAGNOSIS — Z9089 Acquired absence of other organs: Secondary | ICD-10-CM | POA: Diagnosis not present

## 2013-12-03 DIAGNOSIS — Y9389 Activity, other specified: Secondary | ICD-10-CM | POA: Insufficient documentation

## 2013-12-03 DIAGNOSIS — Z9889 Other specified postprocedural states: Secondary | ICD-10-CM | POA: Diagnosis not present

## 2013-12-03 DIAGNOSIS — S3981XA Other specified injuries of abdomen, initial encounter: Secondary | ICD-10-CM | POA: Insufficient documentation

## 2013-12-03 MED ORDER — OXYCODONE-ACETAMINOPHEN 5-325 MG PO TABS: 1.0000 | ORAL_TABLET | Freq: Four times a day (QID) | ORAL | Status: DC | PRN
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
## 2013-12-03 NOTE — ED Notes (Signed)
MD at bedside. 
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
## 2013-12-03 NOTE — Discharge Instructions (Signed)

## 2013-12-11 ENCOUNTER — Other Ambulatory Visit (HOSPITAL_COMMUNITY): Payer: Self-pay | Admitting: Chiropractic Medicine

## 2013-12-11 ENCOUNTER — Ambulatory Visit (HOSPITAL_COMMUNITY)
Admission: RE | Admit: 2013-12-11 | Discharge: 2013-12-11 | Disposition: A | Discharge status: Home / Self Care | Payer: No Typology Code available for payment source | Source: Ambulatory Visit | Attending: Chiropractic Medicine | Admitting: Chiropractic Medicine

## 2013-12-11 DIAGNOSIS — M545 Low back pain, unspecified: Secondary | ICD-10-CM

## 2013-12-11 DIAGNOSIS — M47817 Spondylosis without myelopathy or radiculopathy, lumbosacral region: Secondary | ICD-10-CM | POA: Insufficient documentation

## 2013-12-11 DIAGNOSIS — Z85038 Personal history of other malignant neoplasm of large intestine: Secondary | ICD-10-CM | POA: Diagnosis not present

## 2013-12-11 DIAGNOSIS — M549 Dorsalgia, unspecified: Secondary | ICD-10-CM | POA: Diagnosis present

## 2013-12-11 DIAGNOSIS — R52 Pain, unspecified: Secondary | ICD-10-CM

## 2014-03-15 ENCOUNTER — Other Ambulatory Visit: Payer: Self-pay | Admitting: Internal Medicine

## 2014-07-20 ENCOUNTER — Other Ambulatory Visit (HOSPITAL_COMMUNITY): Payer: Self-pay | Admitting: Chiropractic Medicine

## 2014-07-20 DIAGNOSIS — M545 Low back pain: Secondary | ICD-10-CM

## 2014-07-28 ENCOUNTER — Ambulatory Visit (HOSPITAL_COMMUNITY)
Admission: RE | Admit: 2014-07-28 | Discharge: 2014-07-28 | Disposition: A | Discharge status: Home / Self Care | Payer: Medicare Other | Source: Ambulatory Visit | Attending: Chiropractic Medicine | Admitting: Chiropractic Medicine

## 2014-07-28 DIAGNOSIS — M545 Low back pain: Secondary | ICD-10-CM | POA: Diagnosis not present

## 2017-09-07 ENCOUNTER — Other Ambulatory Visit: Payer: Self-pay | Admitting: Nurse Practitioner

## 2017-09-07 DIAGNOSIS — K432 Incisional hernia without obstruction or gangrene: Secondary | ICD-10-CM

## 2017-09-07 DIAGNOSIS — R1011 Right upper quadrant pain: Secondary | ICD-10-CM

## 2017-09-07 DIAGNOSIS — R1032 Left lower quadrant pain: Secondary | ICD-10-CM

## 2017-09-19 ENCOUNTER — Ambulatory Visit
Admission: RE | Admit: 2017-09-19 | Discharge: 2017-09-19 | Disposition: A | Discharge status: Home / Self Care | Payer: Medicare Other | Source: Ambulatory Visit | Attending: Nurse Practitioner | Admitting: Nurse Practitioner

## 2017-09-19 DIAGNOSIS — K432 Incisional hernia without obstruction or gangrene: Secondary | ICD-10-CM

## 2017-09-19 DIAGNOSIS — R1032 Left lower quadrant pain: Secondary | ICD-10-CM

## 2017-09-19 DIAGNOSIS — R1011 Right upper quadrant pain: Secondary | ICD-10-CM

## 2017-09-19 MED ORDER — IOHEXOL 300 MG/ML  SOLN
125.0000 mL | Freq: Once | INTRAMUSCULAR | Status: AC | PRN
  Administered 2017-09-19: 125 mL via INTRAVENOUS

## 2017-10-30 ENCOUNTER — Other Ambulatory Visit: Payer: Self-pay | Admitting: Specialist

## 2017-10-30 DIAGNOSIS — D890 Polyclonal hypergammaglobulinemia: Principal | ICD-10-CM

## 2017-10-30 DIAGNOSIS — D472 Monoclonal gammopathy: Secondary | ICD-10-CM

## 2017-10-31 ENCOUNTER — Telehealth: Payer: Self-pay | Admitting: General Practice

## 2017-10-31 NOTE — Telephone Encounter (Signed)
The Fax has been rec'd for Dr Beryle Beams

## 2017-10-31 NOTE — Telephone Encounter (Signed)
Caren Griffins from [employer] is following on a fax from 06/25 for an medical option. Also will re-faxed request, contact # [phone]

## 2017-11-05 ENCOUNTER — Other Ambulatory Visit: Payer: Medicare Other

## 2017-11-08 ENCOUNTER — Ambulatory Visit
Admission: RE | Admit: 2017-11-08 | Discharge: 2017-11-08 | Disposition: A | Discharge status: Home / Self Care | Payer: Medicare Other | Source: Ambulatory Visit | Attending: Specialist | Admitting: Specialist

## 2017-11-08 DIAGNOSIS — D890 Polyclonal hypergammaglobulinemia: Principal | ICD-10-CM

## 2017-11-08 DIAGNOSIS — D472 Monoclonal gammopathy: Secondary | ICD-10-CM

## 2017-11-15 ENCOUNTER — Encounter (HOSPITAL_COMMUNITY): Payer: Self-pay | Admitting: *Deleted

## 2017-11-15 ENCOUNTER — Ambulatory Visit: Payer: Self-pay | Admitting: Surgery

## 2017-11-15 NOTE — H&P (View-Only) (Signed)
Raymond Allen Documented: 11/15/2017 11:32 AM Location: [facility] Patient #: [id number] DOB: 1960-05-09 Single / Language: Raymond Allen / Race: Black or African American Male  History of Present Illness (Raymond Allen A. Raymond Heller MD; 11/15/2017 12:03 PM) Patient words: This is a 57 year old gentleman with an incisional hernia. He first noticed it about 2 years ago when he quit smoking and resumed mowing lawns; he noted and not just the left of midline above the umbilicus. This does cause him some discomfort. He denies any issues with nausea vomiting or constipation. The hernias have been noted on multiple CT scans as far back as 2008. He has a history of colon cancer and had a colectomy by Dr. Margot Allen 2001; subsequently had a liver metastasis and underwent a left hepatectomy. He states his last colonoscopy was about a year ago.  The patient is a 57 year old male.   Past Surgical History Raymond Allen, Raymond Allen; 11/15/2017 11:33 AM) Colon Removal - Partial Liver Surgery  Diagnostic Studies History Raymond Allen, [place]; 11/15/2017 11:33 AM) Colonoscopy 1-5 years ago  Allergies Raymond Allen, CMA; 11/15/2017 11:34 AM) No Known Allergies [11/15/2017]:  Medication History (Raymond Allen, CMA; 11/15/2017 11:35 AM) AmLODIPine Besylate (5MG  Tablet, Oral) Active. Cyclobenzaprine HCl (10MG  Tablet, Oral) Active. MetFORMIN HCl ER (500MG  Tablet ER 24HR, Oral) Active. Vitamin D (Ergocalciferol) (50000UNIT Capsule, Oral) Active. HydroCHLOROthiazide (25MG  Tablet, Oral) Active. GlipiZIDE ER (5MG  Tablet ER 24HR, Oral) Active. Gabapentin (300MG  Capsule, Oral) Active. Sildenafil Citrate (20MG  Tablet, Oral) Active. Medications Reconciled  Social History Raymond Allen, CMA; 11/15/2017 11:33 AM) Alcohol use Remotely quit alcohol use. Caffeine use Carbonated beverages. Illicit drug use Remotely quit drug use. Tobacco use Former smoker.  Family History Raymond Allen, Raymond Allen; 11/15/2017 11:33  AM) Diabetes Mellitus Mother. Hypertension Mother.  Other Problems (Raymond Allen, CMA; 11/15/2017 11:33 AM) Back Pain Colon Cancer Diabetes Mellitus High blood pressure     Review of Systems (Raymond Allen A. Raymond Heller MD; 11/15/2017 12:03 PM) General Present- Weight Gain. Not Present- Appetite Loss, Chills, Fatigue, Fever, Night Sweats and Weight Loss. Skin Not Present- Change in Wart/Mole, Dryness, Hives, Jaundice, New Lesions, Non-Healing Wounds, Rash and Ulcer. HEENT Present- Wears glasses/contact lenses. Not Present- Earache, Hearing Loss, Hoarseness, Nose Bleed, Oral Ulcers, Ringing in the Ears, Seasonal Allergies, Sinus Pain, Sore Throat, Visual Disturbances and Yellow Eyes. Respiratory Not Present- Bloody sputum, Chronic Cough, Difficulty Breathing, Snoring and Wheezing. Breast Not Present- Breast Mass, Breast Pain, Nipple Discharge and Skin Changes. Cardiovascular Present- Swelling of Extremities. Not Present- Chest Pain, Difficulty Breathing Lying Down, Leg Cramps, Palpitations, Rapid Heart Rate and Shortness of Breath. Psychiatric Not Present- Anxiety, Bipolar, Change in Sleep Pattern, Depression, Fearful and Frequent crying. Endocrine Not Present- Cold Intolerance, Excessive Hunger, Hair Changes, Heat Intolerance, Hot flashes and New Diabetes. Hematology Not Present- Blood Thinners, Easy Bruising, Excessive bleeding, Gland problems, HIV and Persistent Infections. All other systems negative  Vitals (Raymond Allen CMA; 11/15/2017 11:34 AM) 11/15/2017 11:34 AM Weight: 289.38 lb Height: 73in Body Surface Area: 2.52 m Body Mass Index: 38.18 kg/m  Temp.: 97.66F  Pulse: 105 (Regular)  P.OX: 93% (Room air) BP: 162/96 (Sitting, Left Arm, Standard)      Physical Exam (Raymond Allen A. Raymond Heller MD; 11/15/2017 12:03 PM)  The physical exam findings are as follows: Note:Gen: alert and well appearing Eye: extraocular motion intact, no scleral icterus ENT: moist mucus membranes,  dentition intact Neck: no mass or thyromegaly Chest: unlabored respirations, symmetrical air entry, clear bilaterally CV: regular rate and rhythm, no pedal edema Abdomen: soft,  nontender, nondistended. Well-healed bilateral subcostal and midline incisions. Above the umbilicus just to the left of midline there is a large bulge which the patient states is always present. With the patient supine for several minutes the hernia is able be reduced. MSK: strength symmetrical throughout, no deformity Neuro: grossly intact, normal gait Psych: normal mood and affect, appropriate insight Skin: warm and dry, no rash or lesion on limited exam    Assessment & Plan (Raymond Allen A. Raymond Lupu MD; 11/15/2017 12:04 PM)  Raymond Allen HERNIA (K43.2) Story: Contains bowel, was reducible today that sounds like for the most part this remains chronically incarcerated. We discussed repair with a laparoscopic approach, discussed risk of bleeding, infection, pain, scarring, injury to intra-abdominal structures specifically bowel, the possibility that he has extensive adhesive disease or require extensive adhesio lysis, risk of ileus, hernia recurrence, as well as general risks of blood clot, heart attack, pneumonia, stroke, death. Questions were welcomed and answered. We will schedule surgery.

## 2017-11-15 NOTE — H&P (Signed)
Raymond Allen Documented: 11/15/2017 11:32 AM Location: [facility] Patient #: [id number] DOB: Apr 28, 1960 Single / Language: Raymond Allen / Race: Black or African American Male  History of Present Illness (Kenitha Glendinning A. Kae Heller MD; 11/15/2017 12:03 PM) Patient words: This is a 57 year old gentleman with an incisional hernia. He first noticed it about 2 years ago when he quit smoking and resumed mowing lawns; he noted and not just the left of midline above the umbilicus. This does cause him some discomfort. He denies any issues with nausea vomiting or constipation. The hernias have been noted on multiple CT scans as far back as 2008. He has a history of colon cancer and had a colectomy by Dr. 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 He states his last colonoscopy was about a year ago.  The patient is a 57 year old male.   Past Surgical History Andreas Blower, [place]; 11/15/2017 11:33 AM) Colon Removal - Partial Liver Surgery  Diagnostic Studies History Andreas Blower, [place]; 11/15/2017 11:33 AM) Colonoscopy 1-5 years ago  Allergies Andreas Blower, CMA; 11/15/2017 11:34 AM) No Known Allergies [11/15/2017]:  Medication History (Armen Ferguson, CMA; 11/15/2017 11:35 AM) AmLODIPine Besylate (5MG  Tablet, Oral) Active. Cyclobenzaprine HCl (10MG  Tablet, Oral) Active. MetFORMIN HCl ER (500MG  Tablet ER 24HR, Oral) Active. Vitamin D (Ergocalciferol) (50000UNIT Capsule, Oral) Active. HydroCHLOROthiazide (25MG  Tablet, Oral) Active. GlipiZIDE ER (5MG  Tablet ER 24HR, Oral) Active. Gabapentin (300MG  Capsule, Oral) Active. Sildenafil Citrate (20MG  Tablet, Oral) Active. Medications Reconciled  Social History Andreas Blower, CMA; 11/15/2017 11:33 AM) Alcohol use Remotely quit alcohol use. Caffeine use Carbonated beverages. Illicit drug use Remotely quit drug use. Tobacco use Former smoker.  Family History Andreas Blower, [place]; 11/15/2017 11:33  AM) Diabetes Mellitus Mother. Hypertension Mother.  Other Problems (Armen Ferguson, CMA; 11/15/2017 11:33 AM) Back Pain Colon Cancer Diabetes Mellitus High blood pressure     Review of Systems (Shaquita Fort A. Kae Heller MD; 11/15/2017 12:03 PM) General Present- Weight Gain. Not Present- Appetite Loss, Chills, Fatigue, Fever, Night Sweats and Weight Loss. Skin Not Present- Change in Wart/Mole, Dryness, Hives, Jaundice, New Lesions, Non-Healing Wounds, Rash and Ulcer. HEENT Present- Wears glasses/contact lenses. Not Present- Earache, Hearing Loss, Hoarseness, Nose Bleed, Oral Ulcers, Ringing in the Ears, Seasonal Allergies, Sinus Pain, Sore Throat, Visual Disturbances and Yellow Eyes. Respiratory Not Present- Bloody sputum, Chronic Cough, Difficulty Breathing, Snoring and Wheezing. Breast Not Present- Breast Mass, Breast Pain, Nipple Discharge and Skin Changes. Cardiovascular Present- Swelling of Extremities. Not Present- Chest Pain, Difficulty Breathing Lying Down, Leg Cramps, Palpitations, Rapid Heart Rate and Shortness of Breath. Psychiatric Not Present- Anxiety, Bipolar, Change in Sleep Pattern, Depression, Fearful and Frequent crying. Endocrine Not Present- Cold Intolerance, Excessive Hunger, Hair Changes, Heat Intolerance, Hot flashes and New Diabetes. Hematology Not Present- Blood Thinners, Easy Bruising, Excessive bleeding, Gland problems, HIV and Persistent Infections. All other systems negative  Vitals (Armen Ferguson CMA; 11/15/2017 11:34 AM) 11/15/2017 11:34 AM Weight: 289.38 lb Height: 73in Body Surface Area: 2.52 m Body Mass Index: 38.18 kg/m  Temp.: 97.45F  Pulse: 105 (Regular)  P.OX: 93% (Room air) BP: 162/96 (Sitting, Left Arm, Standard)      Physical Exam (Shonnie Poudrier A. Kae Heller MD; 11/15/2017 12:03 PM)  The physical exam findings are as follows: Note:Gen: alert and well appearing Eye: extraocular motion intact, no scleral icterus ENT: moist mucus membranes,  dentition intact Neck: no mass or thyromegaly Chest: unlabored respirations, symmetrical air entry, clear bilaterally CV: regular rate and rhythm, no pedal edema Abdomen: soft,  nontender, nondistended. Well-healed bilateral subcostal and midline incisions. Above the umbilicus just to the left of midline there is a large bulge which the patient states is always present. With the patient supine for several minutes the hernia is able be reduced. MSK: strength symmetrical throughout, no deformity Neuro: grossly intact, normal gait Psych: normal mood and affect, appropriate insight Skin: warm and dry, no rash or lesion on limited exam    Assessment & Plan (Shonice Wrisley A. Sheray Grist MD; 11/15/2017 12:04 PM)  Fatima Blank HERNIA (K43.2) Story: Contains bowel, was reducible today that sounds like for the most part this remains chronically incarcerated. We discussed repair with a laparoscopic approach, discussed risk of bleeding, infection, pain, scarring, injury to intra-abdominal structures specifically bowel, the possibility that he has extensive adhesive disease or require extensive adhesio lysis, risk of ileus, hernia recurrence, as well as general risks of blood clot, heart attack, pneumonia, stroke, death. Questions were welcomed and answered. We will schedule surgery.

## 2017-11-20 NOTE — Patient Instructions (Signed)
Raymond Allen  11/20/2017   Your procedure is scheduled on: 11-27-17   Report to [facility] Main  Entrance    Report to Amitting at 8:00 AM    Call this number if you have problems the morning of surgery [phone]   Remember: Do not eat food or drink liquids :After Midnight.     Take these medicines the morning of surgery with A SIP OF WATER: Amlodipine (Norvasc) and Gabapentin (Neurontin)   DO NOT TAKE ANY DIABETIC MEDICATIONS DAY OF YOUR SURGERY                               You may not have any metal on your body including hair pins and              piercings  Do not wear jewelry, lotions, powders, cologne or deodorant             Men may shave face and neck.   Do not bring valuables to the hospital. [place].  Contacts, dentures or bridgework may not be worn into surgery.  Leave suitcase in the car. After surgery it may be brought to your room.       Special Instructions: N/A              Please read over the following fact sheets you were given: _____________________________________________________________________             How to Manage Your Diabetes Before and After Surgery  Why is it important to control my blood sugar before and after surgery? . Improving blood sugar levels before and after surgery helps healing and can limit problems. . A way of improving blood sugar control is eating a healthy diet by: o  Eating less sugar and carbohydrates o  Increasing activity/exercise o  Talking with your doctor about reaching your blood sugar goals . High blood sugars (greater than 180 mg/dL) can raise your risk of infections and slow your recovery, so you will need to focus on controlling your diabetes during the weeks before surgery. . Make sure that the doctor who takes care of your diabetes knows about your planned surgery including the date and location.  How do I manage my blood sugar  before surgery? . Check your blood sugar at least 4 times a day, starting 2 days before surgery, to make sure that the level is not too high or low. o Check your blood sugar the morning of your surgery when you wake up and every 2 hours until you get to the Short Stay unit. . If your blood sugar is less than 70 mg/dL, you will need to treat for low blood sugar: o Do not take insulin. o Treat a low blood sugar (less than 70 mg/dL) with  cup of clear juice (cranberry or apple), 4 glucose tablets, OR glucose gel. o Recheck blood sugar in 15 minutes after treatment (to make sure it is greater than 70 mg/dL). If your blood sugar is not greater than 70 mg/dL on recheck, call [phone] for further instructions. . Report your blood sugar to the short stay nurse when you get to Short Stay.  . If you are admitted to the hospital after surgery: o Your  blood sugar will be checked by the staff and you will probably be given insulin after surgery (instead of oral diabetes medicines) to make sure you have good blood sugar levels. o The goal for blood sugar control after surgery is 80-180 mg/dL.   WHAT DO I DO ABOUT MY DIABETES MEDICATION?  Marland Kitchen Do not take oral diabetes medicines (pills) the morning of surgery.  . THE DAY BEFORE SURGERY, take only your morning and/or lunch dose of Glipizide. You can take your usual dose of Metformin        [facility] - Preparing for Surgery Before surgery, you can play an important role.  Because skin is not sterile, your skin needs to be as free of germs as possible.  You can reduce the number of germs on your skin by washing with CHG (chlorahexidine gluconate) soap before surgery.  CHG is an antiseptic cleaner which kills germs and bonds with the skin to continue killing germs even after washing. Please DO NOT use if you have an allergy to CHG or antibacterial soaps.  If your skin becomes reddened/irritated stop using the CHG and inform your nurse when you arrive at  Short Stay. Do not shave (including legs and underarms) for at least 48 hours prior to the first CHG shower.  You may shave your face/neck. Please follow these instructions carefully:  1.  Shower with CHG Soap the night before surgery and the  morning of Surgery.  2.  If you choose to wash your hair, wash your hair first as usual with your  normal  shampoo.  3.  After you shampoo, rinse your hair and body thoroughly to remove the  shampoo.                           4.  Use CHG as you would any other liquid soap.  You can apply chg directly  to the skin and wash                       Gently with a scrungie or clean washcloth.  5.  Apply the CHG Soap to your body ONLY FROM THE NECK DOWN.   Do not use on face/ open                           Wound or open sores. Avoid contact with eyes, ears mouth and genitals (private parts).                       Wash face,  Genitals (private parts) with your normal soap.             6.  Wash thoroughly, paying special attention to the area where your surgery  will be performed.  7.  Thoroughly rinse your body with warm water from the neck down.  8.  DO NOT shower/wash with your normal soap after using and rinsing off  the CHG Soap.                9.  Pat yourself dry with a clean towel.            10.  Wear clean pajamas.            11.  Place clean sheets on your bed the night of your first shower and do not  sleep with pets. Day of Surgery : Do  not apply any lotions/deodorants the morning of surgery.  Please wear clean clothes to the hospital/surgery center.  FAILURE TO FOLLOW THESE INSTRUCTIONS MAY RESULT IN THE CANCELLATION OF YOUR SURGERY PATIENT SIGNATURE_________________________________  NURSE SIGNATURE__________________________________  ________________________________________________________________________

## 2017-11-22 ENCOUNTER — Encounter (HOSPITAL_COMMUNITY): Payer: Self-pay

## 2017-11-22 ENCOUNTER — Encounter (HOSPITAL_COMMUNITY)
Admission: RE | Admit: 2017-11-22 | Discharge: 2017-11-22 | Disposition: A | Discharge status: Home / Self Care | Payer: Medicare Other | Source: Ambulatory Visit | Attending: Surgery | Admitting: Surgery

## 2017-11-22 ENCOUNTER — Other Ambulatory Visit: Payer: Self-pay

## 2017-11-22 ENCOUNTER — Ambulatory Visit (HOSPITAL_COMMUNITY)
Admission: RE | Admit: 2017-11-22 | Discharge: 2017-11-22 | Disposition: A | Discharge status: Home / Self Care | Payer: Medicare Other | Source: Ambulatory Visit | Attending: Surgery | Admitting: Surgery

## 2017-11-22 DIAGNOSIS — Z01818 Encounter for other preprocedural examination: Secondary | ICD-10-CM | POA: Insufficient documentation

## 2017-11-22 DIAGNOSIS — R9431 Abnormal electrocardiogram [ECG] [EKG]: Secondary | ICD-10-CM | POA: Insufficient documentation

## 2017-11-22 DIAGNOSIS — Z01810 Encounter for preprocedural cardiovascular examination: Secondary | ICD-10-CM

## 2017-11-22 DIAGNOSIS — K432 Incisional hernia without obstruction or gangrene: Secondary | ICD-10-CM | POA: Diagnosis not present

## 2017-11-22 DIAGNOSIS — Z01812 Encounter for preprocedural laboratory examination: Secondary | ICD-10-CM | POA: Insufficient documentation

## 2017-11-22 HISTORY — DX: Type 2 diabetes mellitus without complications: E11.9

## 2017-11-22 LAB — CBC WITH DIFFERENTIAL/PLATELET
BASOS ABS: 0 10*3/uL (ref 0.0–0.1)
Basophils Relative: 0 %
EOS ABS: 0.1 10*3/uL (ref 0.0–0.7)
EOS PCT: 1 %
HCT: 44.7 % (ref 39.0–52.0)
HEMOGLOBIN: 14.4 g/dL (ref 13.0–17.0)
LYMPHS ABS: 2.8 10*3/uL (ref 0.7–4.0)
Lymphocytes Relative: 39 %
MCH: 26.4 pg (ref 26.0–34.0)
MCHC: 32.2 g/dL (ref 30.0–36.0)
MCV: 82 fL (ref 78.0–100.0)
Monocytes Absolute: 0.3 10*3/uL (ref 0.1–1.0)
Monocytes Relative: 5 %
NEUTROS PCT: 55 %
Neutro Abs: 3.9 10*3/uL (ref 1.7–7.7)
PLATELETS: 242 10*3/uL (ref 150–400)
RBC: 5.45 MIL/uL (ref 4.22–5.81)
RDW: 14.3 % (ref 11.5–15.5)
WBC: 7.2 10*3/uL (ref 4.0–10.5)

## 2017-11-22 LAB — COMPREHENSIVE METABOLIC PANEL
ALBUMIN: 3.6 g/dL (ref 3.5–5.0)
ALK PHOS: 56 U/L (ref 38–126)
ALT: 21 U/L (ref 0–44)
AST: 15 U/L (ref 15–41)
Anion gap: 8 (ref 5–15)
BUN: 23 mg/dL — AB (ref 6–20)
CALCIUM: 9.5 mg/dL (ref 8.9–10.3)
CHLORIDE: 102 mmol/L (ref 98–111)
CO2: 30 mmol/L (ref 22–32)
CREATININE: 1.28 mg/dL — AB (ref 0.61–1.24)
GFR calc Af Amer: >60 mL/min (ref >60)
GFR calc non Af Amer: 60 mL/min — ABNORMAL LOW (ref >60)
GLUCOSE: 226 mg/dL — AB (ref 70–99)
Potassium: 3.8 mmol/L (ref 3.5–5.1)
SODIUM: 140 mmol/L (ref 135–145)
Total Bilirubin: 1.1 mg/dL (ref 0.3–1.2)
Total Protein: 8.1 g/dL (ref 6.5–8.1)

## 2017-11-22 LAB — HEMOGLOBIN A1C
Hgb A1c MFr Bld: 8.3 % — ABNORMAL HIGH (ref 4.8–5.6)
MEAN PLASMA GLUCOSE: 191.51 mg/dL

## 2017-11-22 LAB — GLUCOSE, CAPILLARY: Glucose-Capillary: 260 mg/dL — ABNORMAL HIGH (ref 70–99)

## 2017-11-26 MED ORDER — BUPIVACAINE LIPOSOME 1.3 % IJ SUSP
20.0000 mL | Freq: Once | INTRAMUSCULAR | Status: DC
  Filled 2017-11-26: qty 20

## 2017-11-26 MED ORDER — DEXTROSE 5 % IV SOLN
3.0000 g | INTRAVENOUS | Status: AC
  Administered 2017-11-27: 3 g via INTRAVENOUS
  Filled 2017-11-26: qty 3

## 2017-11-27 ENCOUNTER — Inpatient Hospital Stay (HOSPITAL_COMMUNITY)
Admission: RE | Admit: 2017-11-27 | Discharge: 2017-11-28 | DRG: 336 | Disposition: A | Discharge status: Home / Self Care | Payer: Medicare Other | Source: Ambulatory Visit | Attending: Surgery | Admitting: Surgery

## 2017-11-27 ENCOUNTER — Encounter (HOSPITAL_COMMUNITY)
Admission: RE | Disposition: A | Discharge status: Home / Self Care | Payer: Self-pay | Source: Ambulatory Visit | Attending: Surgery

## 2017-11-27 ENCOUNTER — Encounter (HOSPITAL_COMMUNITY): Payer: Self-pay | Admitting: *Deleted

## 2017-11-27 ENCOUNTER — Ambulatory Visit (HOSPITAL_COMMUNITY): Payer: Medicare Other | Admitting: Anesthesiology

## 2017-11-27 DIAGNOSIS — Z6838 Body mass index (BMI) 38.0-38.9, adult: Secondary | ICD-10-CM

## 2017-11-27 DIAGNOSIS — Z79899 Other long term (current) drug therapy: Secondary | ICD-10-CM

## 2017-11-27 DIAGNOSIS — K432 Incisional hernia without obstruction or gangrene: Secondary | ICD-10-CM | POA: Diagnosis present

## 2017-11-27 DIAGNOSIS — Z9049 Acquired absence of other specified parts of digestive tract: Secondary | ICD-10-CM

## 2017-11-27 DIAGNOSIS — Z9221 Personal history of antineoplastic chemotherapy: Secondary | ICD-10-CM

## 2017-11-27 DIAGNOSIS — E669 Obesity, unspecified: Secondary | ICD-10-CM | POA: Diagnosis present

## 2017-11-27 DIAGNOSIS — Z87891 Personal history of nicotine dependence: Secondary | ICD-10-CM

## 2017-11-27 DIAGNOSIS — E119 Type 2 diabetes mellitus without complications: Secondary | ICD-10-CM | POA: Diagnosis present

## 2017-11-27 DIAGNOSIS — Z7984 Long term (current) use of oral hypoglycemic drugs: Secondary | ICD-10-CM

## 2017-11-27 DIAGNOSIS — M549 Dorsalgia, unspecified: Secondary | ICD-10-CM | POA: Diagnosis present

## 2017-11-27 DIAGNOSIS — I10 Essential (primary) hypertension: Secondary | ICD-10-CM | POA: Diagnosis present

## 2017-11-27 DIAGNOSIS — K430 Incisional hernia with obstruction, without gangrene: Principal | ICD-10-CM | POA: Diagnosis present

## 2017-11-27 DIAGNOSIS — Z923 Personal history of irradiation: Secondary | ICD-10-CM

## 2017-11-27 DIAGNOSIS — K660 Peritoneal adhesions (postprocedural) (postinfection): Secondary | ICD-10-CM | POA: Diagnosis present

## 2017-11-27 DIAGNOSIS — Z85038 Personal history of other malignant neoplasm of large intestine: Secondary | ICD-10-CM

## 2017-11-27 DIAGNOSIS — C787 Secondary malignant neoplasm of liver and intrahepatic bile duct: Secondary | ICD-10-CM | POA: Diagnosis present

## 2017-11-27 HISTORY — PX: INSERTION OF MESH: SHX5868

## 2017-11-27 HISTORY — PX: LAPAROSCOPIC LYSIS OF ADHESIONS: SHX5905

## 2017-11-27 HISTORY — PX: INCISIONAL HERNIA REPAIR: SHX193

## 2017-11-27 LAB — GLUCOSE, CAPILLARY
GLUCOSE-CAPILLARY: 219 mg/dL — AB (ref 70–99)
GLUCOSE-CAPILLARY: 199 mg/dL — AB (ref 70–99)
Glucose-Capillary: 120 mg/dL — ABNORMAL HIGH (ref 70–99)
Glucose-Capillary: 171 mg/dL — ABNORMAL HIGH (ref 70–99)

## 2017-11-27 SURGERY — REPAIR, HERNIA, INCISIONAL, LAPAROSCOPIC
Anesthesia: General | Site: Abdomen

## 2017-11-27 MED ORDER — METOPROLOL TARTRATE 5 MG/5ML IV SOLN: 5.0000 mg | Freq: Four times a day (QID) | INTRAVENOUS | Status: DC | PRN

## 2017-11-27 MED ORDER — METHOCARBAMOL 1000 MG/10ML IJ SOLN
500.0000 mg | Freq: Four times a day (QID) | INTRAVENOUS | Status: DC | PRN
  Filled 2017-11-27: qty 5

## 2017-11-27 MED ORDER — GABAPENTIN 300 MG PO CAPS
600.0000 mg | ORAL_CAPSULE | ORAL | Status: DC
  Administered 2017-11-27: 600 mg via ORAL
  Filled 2017-11-27: qty 2

## 2017-11-27 MED ORDER — CHLORHEXIDINE GLUCONATE 4 % EX LIQD: 60.0000 mL | Freq: Once | CUTANEOUS | Status: DC

## 2017-11-27 MED ORDER — POLYETHYLENE GLYCOL 3350 17 G PO PACK: 17.0000 g | PACK | Freq: Every day | ORAL | Status: DC | PRN

## 2017-11-27 MED ORDER — LACTATED RINGERS IR SOLN
Status: DC | PRN
  Administered 2017-11-27: 1000 mL

## 2017-11-27 MED ORDER — ONDANSETRON HCL 4 MG/2ML IJ SOLN
INTRAMUSCULAR | Status: AC
  Filled 2017-11-27: qty 2

## 2017-11-27 MED ORDER — ONDANSETRON HCL 4 MG/2ML IJ SOLN
INTRAMUSCULAR | Status: DC | PRN
  Administered 2017-11-27: 4 mg via INTRAVENOUS

## 2017-11-27 MED ORDER — BUPIVACAINE-EPINEPHRINE 0.25% -1:200000 IJ SOLN
INTRAMUSCULAR | Status: DC | PRN
  Administered 2017-11-27: 30 mL

## 2017-11-27 MED ORDER — FENTANYL CITRATE (PF) 100 MCG/2ML IJ SOLN
INTRAMUSCULAR | Status: DC | PRN
  Administered 2017-11-27: 100 ug via INTRAVENOUS
  Administered 2017-11-27 (×3): 50 ug via INTRAVENOUS

## 2017-11-27 MED ORDER — ONDANSETRON HCL 4 MG/2ML IJ SOLN: 4.0000 mg | Freq: Four times a day (QID) | INTRAMUSCULAR | Status: DC | PRN

## 2017-11-27 MED ORDER — LIDOCAINE 2% (20 MG/ML) 5 ML SYRINGE
INTRAMUSCULAR | Status: DC | PRN
  Administered 2017-11-27: 50 mg via INTRAVENOUS

## 2017-11-27 MED ORDER — INSULIN ASPART 100 UNIT/ML ~~LOC~~ SOLN
0.0000 [IU] | Freq: Three times a day (TID) | SUBCUTANEOUS | Status: DC
  Administered 2017-11-27: 7 [IU] via SUBCUTANEOUS

## 2017-11-27 MED ORDER — PROPOFOL 10 MG/ML IV BOLUS
INTRAVENOUS | Status: DC | PRN
  Administered 2017-11-27: 200 mg via INTRAVENOUS

## 2017-11-27 MED ORDER — AMLODIPINE BESYLATE 5 MG PO TABS: 5.0000 mg | ORAL_TABLET | Freq: Every day | ORAL | Status: DC

## 2017-11-27 MED ORDER — DIPHENHYDRAMINE HCL 12.5 MG/5ML PO ELIX: 12.5000 mg | ORAL_SOLUTION | Freq: Four times a day (QID) | ORAL | Status: DC | PRN

## 2017-11-27 MED ORDER — OXYCODONE HCL 5 MG PO TABS: 5.0000 mg | ORAL_TABLET | Freq: Once | ORAL | Status: DC | PRN

## 2017-11-27 MED ORDER — GABAPENTIN 300 MG PO CAPS
600.0000 mg | ORAL_CAPSULE | ORAL | Status: DC
Start: 2017-11-27 — End: 2017-11-27

## 2017-11-27 MED ORDER — PHENYLEPHRINE 40 MCG/ML (10ML) SYRINGE FOR IV PUSH (FOR BLOOD PRESSURE SUPPORT)
PREFILLED_SYRINGE | INTRAVENOUS | Status: DC | PRN
  Administered 2017-11-27 (×2): 40 ug via INTRAVENOUS
  Administered 2017-11-27: 80 ug via INTRAVENOUS

## 2017-11-27 MED ORDER — INSULIN ASPART 100 UNIT/ML ~~LOC~~ SOLN: 0.0000 [IU] | Freq: Every day | SUBCUTANEOUS | Status: DC

## 2017-11-27 MED ORDER — DIPHENHYDRAMINE HCL 50 MG/ML IJ SOLN: 12.5000 mg | Freq: Four times a day (QID) | INTRAMUSCULAR | Status: DC | PRN

## 2017-11-27 MED ORDER — MIDAZOLAM HCL 2 MG/2ML IJ SOLN
INTRAMUSCULAR | Status: AC
  Filled 2017-11-27: qty 2

## 2017-11-27 MED ORDER — DEXAMETHASONE SODIUM PHOSPHATE 10 MG/ML IJ SOLN
INTRAMUSCULAR | Status: DC | PRN
  Administered 2017-11-27: 10 mg via INTRAVENOUS

## 2017-11-27 MED ORDER — LIDOCAINE HCL 2 % IJ SOLN
INTRAMUSCULAR | Status: AC
  Filled 2017-11-27: qty 40

## 2017-11-27 MED ORDER — ROCURONIUM BROMIDE 10 MG/ML (PF) SYRINGE
PREFILLED_SYRINGE | INTRAVENOUS | Status: DC | PRN
  Administered 2017-11-27: 60 mg via INTRAVENOUS
  Administered 2017-11-27 (×2): 10 mg via INTRAVENOUS

## 2017-11-27 MED ORDER — SUGAMMADEX SODIUM 500 MG/5ML IV SOLN
INTRAVENOUS | Status: AC
  Filled 2017-11-27: qty 5

## 2017-11-27 MED ORDER — SODIUM CHLORIDE 0.9 % IV SOLN
INTRAVENOUS | Status: DC
  Administered 2017-11-27 – 2017-11-28 (×2): via INTRAVENOUS

## 2017-11-27 MED ORDER — HYDROMORPHONE HCL 1 MG/ML IJ SOLN: 0.5000 mg | INTRAMUSCULAR | Status: DC | PRN

## 2017-11-27 MED ORDER — HYDRALAZINE HCL 20 MG/ML IJ SOLN: 10.0000 mg | INTRAMUSCULAR | Status: DC | PRN

## 2017-11-27 MED ORDER — PROMETHAZINE HCL 25 MG/ML IJ SOLN: 6.2500 mg | INTRAMUSCULAR | Status: DC | PRN

## 2017-11-27 MED ORDER — LACTATED RINGERS IV SOLN
INTRAVENOUS | Status: DC
  Administered 2017-11-27 (×2): via INTRAVENOUS

## 2017-11-27 MED ORDER — DEXAMETHASONE SODIUM PHOSPHATE 10 MG/ML IJ SOLN
INTRAMUSCULAR | Status: AC
  Filled 2017-11-27: qty 1

## 2017-11-27 MED ORDER — BUPIVACAINE-EPINEPHRINE (PF) 0.25% -1:200000 IJ SOLN
INTRAMUSCULAR | Status: AC
  Filled 2017-11-27: qty 30

## 2017-11-27 MED ORDER — ROCURONIUM BROMIDE 10 MG/ML (PF) SYRINGE
PREFILLED_SYRINGE | INTRAVENOUS | Status: AC
  Filled 2017-11-27: qty 10

## 2017-11-27 MED ORDER — OXYCODONE HCL 5 MG/5ML PO SOLN
5.0000 mg | Freq: Once | ORAL | Status: DC | PRN
  Filled 2017-11-27: qty 5

## 2017-11-27 MED ORDER — OXYCODONE HCL 5 MG PO TABS
5.0000 mg | ORAL_TABLET | ORAL | Status: DC | PRN
  Administered 2017-11-27: 10 mg via ORAL
  Filled 2017-11-27: qty 2

## 2017-11-27 MED ORDER — DOCUSATE SODIUM 100 MG PO CAPS
100.0000 mg | ORAL_CAPSULE | Freq: Two times a day (BID) | ORAL | Status: DC
  Administered 2017-11-27: 100 mg via ORAL
  Filled 2017-11-27: qty 1

## 2017-11-27 MED ORDER — ONDANSETRON 4 MG PO TBDP: 4.0000 mg | ORAL_TABLET | Freq: Four times a day (QID) | ORAL | Status: DC | PRN

## 2017-11-27 MED ORDER — LIDOCAINE HCL 2 % IJ SOLN
INTRAMUSCULAR | Status: AC
  Filled 2017-11-27: qty 20

## 2017-11-27 MED ORDER — PHENYLEPHRINE 40 MCG/ML (10ML) SYRINGE FOR IV PUSH (FOR BLOOD PRESSURE SUPPORT)
PREFILLED_SYRINGE | INTRAVENOUS | Status: AC
  Filled 2017-11-27: qty 10

## 2017-11-27 MED ORDER — HEPARIN SODIUM (PORCINE) 5000 UNIT/ML IJ SOLN
5000.0000 [IU] | Freq: Three times a day (TID) | INTRAMUSCULAR | Status: DC
  Administered 2017-11-27 – 2017-11-28 (×2): 5000 [IU] via SUBCUTANEOUS
  Filled 2017-11-27 (×2): qty 1

## 2017-11-27 MED ORDER — TRAMADOL HCL 50 MG PO TABS: 50.0000 mg | ORAL_TABLET | Freq: Four times a day (QID) | ORAL | Status: DC | PRN

## 2017-11-27 MED ORDER — GABAPENTIN 300 MG PO CAPS
300.0000 mg | ORAL_CAPSULE | ORAL | Status: AC
  Administered 2017-11-27: 300 mg via ORAL
  Filled 2017-11-27: qty 1

## 2017-11-27 MED ORDER — 0.9 % SODIUM CHLORIDE (POUR BTL) OPTIME
TOPICAL | Status: DC | PRN
  Administered 2017-11-27: 1000 mL

## 2017-11-27 MED ORDER — ACETAMINOPHEN 500 MG PO TABS
1000.0000 mg | ORAL_TABLET | ORAL | Status: AC
  Administered 2017-11-27: 1000 mg via ORAL
  Filled 2017-11-27: qty 2

## 2017-11-27 MED ORDER — HYDROMORPHONE HCL 1 MG/ML IJ SOLN: 0.2500 mg | INTRAMUSCULAR | Status: DC | PRN

## 2017-11-27 MED ORDER — PROPOFOL 10 MG/ML IV BOLUS
INTRAVENOUS | Status: AC
  Filled 2017-11-27: qty 20

## 2017-11-27 MED ORDER — KETAMINE HCL 10 MG/ML IJ SOLN
INTRAMUSCULAR | Status: DC | PRN
  Administered 2017-11-27: 40 mg via INTRAVENOUS

## 2017-11-27 MED ORDER — SUGAMMADEX SODIUM 500 MG/5ML IV SOLN
INTRAVENOUS | Status: DC | PRN
  Administered 2017-11-27: 300 mg via INTRAVENOUS

## 2017-11-27 MED ORDER — SIMETHICONE 80 MG PO CHEW: 40.0000 mg | CHEWABLE_TABLET | Freq: Four times a day (QID) | ORAL | Status: DC | PRN

## 2017-11-27 MED ORDER — BISACODYL 10 MG RE SUPP: 10.0000 mg | Freq: Every day | RECTAL | Status: DC | PRN

## 2017-11-27 MED ORDER — LIDOCAINE 2% (20 MG/ML) 5 ML SYRINGE
INTRAMUSCULAR | Status: AC
  Filled 2017-11-27: qty 5

## 2017-11-27 MED ORDER — CELECOXIB 200 MG PO CAPS
200.0000 mg | ORAL_CAPSULE | ORAL | Status: AC
  Administered 2017-11-27: 200 mg via ORAL
  Filled 2017-11-27: qty 1

## 2017-11-27 MED ORDER — GABAPENTIN 300 MG PO CAPS
900.0000 mg | ORAL_CAPSULE | Freq: Every day | ORAL | Status: DC
  Administered 2017-11-27: 900 mg via ORAL
  Filled 2017-11-27: qty 3

## 2017-11-27 MED ORDER — LIDOCAINE 20MG/ML (2%) 15 ML SYRINGE OPTIME
INTRAMUSCULAR | Status: DC | PRN
  Administered 2017-11-27: 1.5 mg/kg/h via INTRAVENOUS

## 2017-11-27 MED ORDER — FENTANYL CITRATE (PF) 250 MCG/5ML IJ SOLN
INTRAMUSCULAR | Status: AC
Start: 2017-11-27 — End: ?
  Filled 2017-11-27: qty 5

## 2017-11-27 MED ORDER — ACETAMINOPHEN 500 MG PO TABS
1000.0000 mg | ORAL_TABLET | Freq: Four times a day (QID) | ORAL | Status: DC
  Administered 2017-11-27 – 2017-11-28 (×3): 1000 mg via ORAL
  Filled 2017-11-27 (×3): qty 2

## 2017-11-27 MED ORDER — MIDAZOLAM HCL 5 MG/5ML IJ SOLN
INTRAMUSCULAR | Status: DC | PRN
  Administered 2017-11-27: 2 mg via INTRAVENOUS

## 2017-11-27 MED ORDER — BUPIVACAINE LIPOSOME 1.3 % IJ SUSP
INTRAMUSCULAR | Status: DC | PRN
  Administered 2017-11-27: 20 mL

## 2017-11-27 SURGICAL SUPPLY — 38 items
ADH SKN CLS APL DERMABOND .7 (GAUZE/BANDAGES/DRESSINGS) ×1
APL SKNCLS STERI-STRIP NONHPOA (GAUZE/BANDAGES/DRESSINGS) ×1
APPLIER CLIP 5 13 M/L LIGAMAX5 (MISCELLANEOUS)
APR CLP MED LRG 5 ANG JAW (MISCELLANEOUS)
BENZOIN TINCTURE PRP APPL 2/3 (GAUZE/BANDAGES/DRESSINGS) ×2 IMPLANT
BINDER ABDOMINAL 12 ML 46-62 (SOFTGOODS) ×2 IMPLANT
CABLE HIGH FREQUENCY MONO STRZ (ELECTRODE) ×3 IMPLANT
CHLORAPREP W/TINT 26ML (MISCELLANEOUS) ×3 IMPLANT
CLIP APPLIE 5 13 M/L LIGAMAX5 (MISCELLANEOUS) IMPLANT
CLOSURE WOUND 1/2 X4 (GAUZE/BANDAGES/DRESSINGS) ×1
COVER SURGICAL LIGHT HANDLE (MISCELLANEOUS) ×3 IMPLANT
DECANTER SPIKE VIAL GLASS SM (MISCELLANEOUS) ×2 IMPLANT
DERMABOND ADVANCED (GAUZE/BANDAGES/DRESSINGS) ×2
DERMABOND ADVANCED .7 DNX12 (GAUZE/BANDAGES/DRESSINGS) ×1 IMPLANT
DEVICE SECURE STRAP 25 ABSORB (INSTRUMENTS) ×2 IMPLANT
ELECT REM PT RETURN 15FT ADLT (MISCELLANEOUS) ×3 IMPLANT
GLOVE BIO SURGEON STRL SZ 6 (GLOVE) ×3 IMPLANT
GLOVE INDICATOR 6.5 STRL GRN (GLOVE) ×3 IMPLANT
GOWN STRL REUS W/TWL LRG LVL3 (GOWN DISPOSABLE) ×3 IMPLANT
GOWN STRL REUS W/TWL XL LVL3 (GOWN DISPOSABLE) ×6 IMPLANT
GRASPER SUT TROCAR 14GX15 (MISCELLANEOUS) ×3 IMPLANT
KIT BASIN OR (CUSTOM PROCEDURE TRAY) ×3 IMPLANT
MARKER SKIN DUAL TIP RULER LAB (MISCELLANEOUS) ×3 IMPLANT
MESH VENTRALIGHT ST 6X8 (Mesh Specialty) ×3 IMPLANT
MESH VENTRLGHT ELLIPSE 8X6XMFL (Mesh Specialty) IMPLANT
NEEDLE INSUFFLATION 120MM (ENDOMECHANICALS) ×1 IMPLANT
SCISSORS LAP 5X35 DISP (ENDOMECHANICALS) ×3 IMPLANT
SET IRRIG TUBING LAPAROSCOPIC (IRRIGATION / IRRIGATOR) ×2 IMPLANT
SHEARS HARMONIC ACE PLUS 36CM (ENDOMECHANICALS) IMPLANT
SLEEVE XCEL OPT CAN 5 100 (ENDOMECHANICALS) ×5 IMPLANT
STRIP CLOSURE SKIN 1/2X4 (GAUZE/BANDAGES/DRESSINGS) ×2 IMPLANT
SUT ETHIBOND 0 (SUTURE) ×8 IMPLANT
SUT MNCRL AB 4-0 PS2 18 (SUTURE) ×3 IMPLANT
TOWEL OR 17X26 10 PK STRL BLUE (TOWEL DISPOSABLE) ×3 IMPLANT
TRAY LAPAROSCOPIC (CUSTOM PROCEDURE TRAY) ×3 IMPLANT
TROCAR BLADELESS OPT 5 100 (ENDOMECHANICALS) ×3 IMPLANT
TROCAR XCEL 12X100 BLDLESS (ENDOMECHANICALS) ×3 IMPLANT
TUBING INSUF HEATED (TUBING) ×3 IMPLANT

## 2017-11-27 NOTE — Anesthesia Preprocedure Evaluation (Addendum)
Anesthesia Evaluation  Patient identified by MRN, date of birth, ID band Patient awake    Reviewed: Allergy & Precautions, NPO status , Patient's Chart, lab work & pertinent test results  Airway Mallampati: III  TM Distance: >3 FB Neck ROM: Full    Dental  (+) Missing,    Pulmonary former smoker,    Pulmonary exam normal breath sounds clear to auscultation       Cardiovascular hypertension, Pt. on medications Normal cardiovascular exam Rhythm:Regular Rate:Normal  ECG: NSR, rate 83   Neuro/Psych negative neurological ROS  negative psych ROS   GI/Hepatic Neg liver ROS, Colon cancer metastasized to liver s/p chemo and rads   Endo/Other  diabetes, Oral Hypoglycemic Agents  Renal/GU negative Renal ROS     Musculoskeletal negative musculoskeletal ROS (+)   Abdominal (+) + obese,   Peds  Hematology negative hematology ROS (+)   Anesthesia Other Findings Incisional hernia  Reproductive/Obstetrics                            Anesthesia Physical Anesthesia Plan  ASA: III  Anesthesia Plan: General   Post-op Pain Management:    Induction: Intravenous  PONV Risk Score and Plan: 3 and Midazolam, Dexamethasone, Ondansetron and Treatment may vary due to age or medical condition  Airway Management Planned: Oral ETT  Additional Equipment:   Intra-op Plan:   Post-operative Plan: Extubation in OR  Informed Consent: I have reviewed the patients History and Physical, chart, labs and discussed the procedure including the risks, benefits and alternatives for the proposed anesthesia with the patient or authorized representative who has indicated his/her understanding and acceptance.   Dental advisory given  Plan Discussed with: CRNA  Anesthesia Plan Comments:       Anesthesia Quick Evaluation

## 2017-11-27 NOTE — Transfer of Care (Signed)
Immediate Anesthesia Transfer of Care Note  Patient: Raymond Allen  Procedure(s) Performed: LAPAROSCOPIC ASSISTED INCISIONAL HERNIA (N/A Abdomen) INSERTION OF MESH (N/A Abdomen) LAPAROSCOPIC LYSIS OF ADHESIONS (N/A Abdomen)  Patient Location: PACU  Anesthesia Type:General  Level of Consciousness: awake, alert  and oriented  Airway & Oxygen Therapy: Patient Spontanous Breathing and Patient connected to face mask oxygen  Post-op Assessment: Report given to RN and Post -op Vital signs reviewed and stable  Post vital signs: Reviewed and stable  Last Vitals:  Vitals Value Taken Time  BP 125/85 11/27/2017 12:17 PM  Temp    Pulse 80 11/27/2017 12:20 PM  Resp 20 11/27/2017 12:20 PM  SpO2 87 % 11/27/2017 12:20 PM  Vitals shown include unvalidated device data.  Last Pain:  Vitals:   11/27/17 0851  TempSrc: Oral  PainSc: 3          Complications: No apparent anesthesia complications

## 2017-11-27 NOTE — Interval H&P Note (Signed)
History and Physical Interval Note:  11/27/2017 9:04 AM  Raymond Allen  has presented today for surgery, with the diagnosis of incisional hernia  The various methods of treatment have been discussed with the patient and family. After consideration of risks, benefits and other options for treatment, the patient has consented to  Procedure(s): LAPAROSCOPIC ASSISTED INCISIONAL HERNIA (N/A) INSERTION OF MESH (N/A) LAPAROSCOPIC LYSIS OF ADHESIONS (N/A) as a surgical intervention .  The patient's history has been reviewed, patient examined, no change in status, stable for surgery.  I have reviewed the patient's chart and labs.  Questions were answered to the patient's satisfaction.     Chelsea Rich Brave

## 2017-11-27 NOTE — Anesthesia Procedure Notes (Signed)
Procedure Name: Intubation Date/Time: 11/27/2017 9:54 AM Performed by: Kaseem Vastine D, CRNA Pre-anesthesia Checklist: Patient identified, Emergency Drugs available, Suction available and Patient being monitored Patient Re-evaluated:Patient Re-evaluated prior to induction Oxygen Delivery Method: Circle system utilized Preoxygenation: Pre-oxygenation with 100% oxygen Induction Type: IV induction Ventilation: Mask ventilation without difficulty Laryngoscope Size: Mac and 4 Grade View: Grade I Tube type: Oral Tube size: 7.5 mm Number of attempts: 1 Airway Equipment and Method: Stylet Placement Confirmation: ETT inserted through vocal cords under direct vision,  positive ETCO2 and breath sounds checked- equal and bilateral Tube secured with: Tape Dental Injury: Teeth and Oropharynx as per pre-operative assessment

## 2017-11-27 NOTE — Op Note (Signed)
Operative Note  Raymond Allen  [id number]  [id number]  11/27/2017   Surgeon: Vikki Ports A ConnorMD  Assistant: none  Procedure performed: laparoscopic lysis of adhesions x 75 minutes, repair of multiple/ swiss cheese type ventral incisional hernias with intraperitoneal onlay mesh  Preop diagnosis: incarcerated incisional hernia Post-op diagnosis/intraop findings: same, multiple small defects largest of which was just above and to the right of the umbilicus measuring about 2.5cm wide, extensive intraabdominal adhesions  Specimens: no Retained items: no EBL: 78GN Complications: none  Description of procedure: After obtaining informed consent the patient was taken to the operating room and placed supine on the operating room table wheregeneral endotracheal anesthesia was initiated, preoperative antibiotics were administered, SCDs applied, and a formal timeout was performed. The abdomen was prepped and draped in the usual sterile fashion. Peritoneal access was gained using a Optiview technique in the left upper quadrant. Insufflation to 15 mmHg ensued without incident. The abdomen was inspected and confirmed to be free of any injury from trocar insertion. There is extensive adhesions of bowel and omentum to the abdominal wall spanning from the subdiaphragmatic region all the way down to the infraumbilical region. Under direct visualization a 5 mm and a 12 mm trocar were placed in the left hemiabdomen. Careful lysis of adhesions using blunt and sharp dissection ensued for about an hour and 15 minutes in order to free the abdominal wall of adhesions and reduce the incarcerated hernias, 2 of these contained fat, the third and most inferior had contained bowel which was reduced prior to starting the case, there was still densely adherent fat remaining within it which was reduced bluntly. No cautery was used during the lysis of adhesions. Ultimately I was able to free the anterior abdominal wall  sufficiently to prove divided place for the mesh. There are still adhesions of bowel to the right lateral abdominal wall as well as in the very upper abdomen. There were several small ventral defects, these were marked using a spinal needle externally and the span of hernia defects measured about 13 cm. The largest defect was the inferior most one just to the patient's left of the umbilicus and was about 5-6/2 cm in diameter. The underlying bowel was inspected and there was no evidence of any bowel injury from our adhesiolysis. A small bleeding vessel on the omentum was controlled with direct cautery. The abdomen was confirmed to be hemostatic. A 20 cm x 15 cm ventralight coated mesh was then chosen and this was brought onto the field. Stay sutures were placed in the 4 cardinal directions using 0 Ethibond. The rough side of the mesh was marked additionally the knots of the stay sutures were on the rough side of the mesh to confirm appropriate orientation. The mesh was moistened and then inserted through our 12 mm trocar. This was unfurled and oriented. The laparoscopic suture passer was used under direct visualization to pull the stay sutures up through the abdominal wall and tied down. The mesh was noted to sit nicely flat across abdominal wall completely covering all of the hernia defects. The secure strap Vicryl tacker was then used to secure the mesh circumferentially to the abdominal wall. An inner crown of tacks was also placed. The abdomen was once again surveyed and confirmed to be free of any injury or bleeding. The 12 mm trocar site was closed with 0 Vicryl in the fascia using a laparoscopic suture passer. Local was infiltrated around each of the incisions. The abdomen was desufflated and all  trochars removed. The skin incisions were closed with subcuticular Monocryl, benzoin Steri-Strips and Band-Aids. The patient was then awakened, extubated and taken to PACU in stable condition.   All counts were  correct at the completion of the case.

## 2017-11-27 NOTE — Anesthesia Postprocedure Evaluation (Signed)
Anesthesia Post Note  Patient: Raymond Allen  Procedure(s) Performed: LAPAROSCOPIC ASSISTED INCISIONAL HERNIA (N/A Abdomen) INSERTION OF MESH (N/A Abdomen) LAPAROSCOPIC LYSIS OF ADHESIONS (N/A Abdomen)     Patient location during evaluation: PACU Anesthesia Type: General Level of consciousness: awake and alert Pain management: pain level controlled Vital Signs Assessment: post-procedure vital signs reviewed and stable Respiratory status: spontaneous breathing, nonlabored ventilation, respiratory function stable and patient connected to nasal cannula oxygen Cardiovascular status: blood pressure returned to baseline and stable Postop Assessment: no apparent nausea or vomiting Anesthetic complications: no    Last Vitals:  Vitals:   11/27/17 1650 11/27/17 1822  BP: 127/86 123/79  Pulse: 75 84  Resp: 20   Temp: 36.9 C 36.6 C  SpO2: 93% 94%    Last Pain:  Vitals:   11/27/17 1822  TempSrc: Oral  PainSc:                  Karyl Kinnier Ellender

## 2017-11-28 ENCOUNTER — Other Ambulatory Visit: Payer: Self-pay

## 2017-11-28 ENCOUNTER — Encounter (HOSPITAL_COMMUNITY): Payer: Self-pay | Admitting: Surgery

## 2017-11-28 DIAGNOSIS — K660 Peritoneal adhesions (postprocedural) (postinfection): Secondary | ICD-10-CM | POA: Diagnosis present

## 2017-11-28 DIAGNOSIS — E669 Obesity, unspecified: Secondary | ICD-10-CM | POA: Diagnosis present

## 2017-11-28 DIAGNOSIS — K430 Incisional hernia with obstruction, without gangrene: Secondary | ICD-10-CM | POA: Diagnosis present

## 2017-11-28 DIAGNOSIS — Z87891 Personal history of nicotine dependence: Secondary | ICD-10-CM | POA: Diagnosis not present

## 2017-11-28 DIAGNOSIS — Z6838 Body mass index (BMI) 38.0-38.9, adult: Secondary | ICD-10-CM | POA: Diagnosis not present

## 2017-11-28 DIAGNOSIS — I10 Essential (primary) hypertension: Secondary | ICD-10-CM | POA: Diagnosis present

## 2017-11-28 DIAGNOSIS — E119 Type 2 diabetes mellitus without complications: Secondary | ICD-10-CM | POA: Diagnosis present

## 2017-11-28 DIAGNOSIS — Z7984 Long term (current) use of oral hypoglycemic drugs: Secondary | ICD-10-CM | POA: Diagnosis not present

## 2017-11-28 DIAGNOSIS — Z923 Personal history of irradiation: Secondary | ICD-10-CM | POA: Diagnosis not present

## 2017-11-28 DIAGNOSIS — Z79899 Other long term (current) drug therapy: Secondary | ICD-10-CM | POA: Diagnosis not present

## 2017-11-28 DIAGNOSIS — C787 Secondary malignant neoplasm of liver and intrahepatic bile duct: Secondary | ICD-10-CM | POA: Diagnosis present

## 2017-11-28 DIAGNOSIS — M549 Dorsalgia, unspecified: Secondary | ICD-10-CM | POA: Diagnosis present

## 2017-11-28 DIAGNOSIS — Z9049 Acquired absence of other specified parts of digestive tract: Secondary | ICD-10-CM | POA: Diagnosis not present

## 2017-11-28 DIAGNOSIS — Z85038 Personal history of other malignant neoplasm of large intestine: Secondary | ICD-10-CM | POA: Diagnosis not present

## 2017-11-28 DIAGNOSIS — Z9221 Personal history of antineoplastic chemotherapy: Secondary | ICD-10-CM | POA: Diagnosis not present

## 2017-11-28 LAB — BASIC METABOLIC PANEL
Anion gap: 8 (ref 5–15)
BUN: 19 mg/dL (ref 6–20)
CHLORIDE: 107 mmol/L (ref 98–111)
CO2: 26 mmol/L (ref 22–32)
Calcium: 8.5 mg/dL — ABNORMAL LOW (ref 8.9–10.3)
Creatinine, Ser: 1.22 mg/dL (ref 0.61–1.24)
GFR calc non Af Amer: >60 mL/min (ref >60)
Glucose, Bld: 205 mg/dL — ABNORMAL HIGH (ref 70–99)
POTASSIUM: 4.1 mmol/L (ref 3.5–5.1)
Sodium: 141 mmol/L (ref 135–145)

## 2017-11-28 LAB — CBC
HCT: 41.3 % (ref 39.0–52.0)
Hemoglobin: 12.8 g/dL — ABNORMAL LOW (ref 13.0–17.0)
MCH: 25.9 pg — ABNORMAL LOW (ref 26.0–34.0)
MCHC: 31 g/dL (ref 30.0–36.0)
MCV: 83.6 fL (ref 78.0–100.0)
PLATELETS: 233 10*3/uL (ref 150–400)
RBC: 4.94 MIL/uL (ref 4.22–5.81)
RDW: 14.4 % (ref 11.5–15.5)
WBC: 10.2 10*3/uL (ref 4.0–10.5)

## 2017-11-28 LAB — MAGNESIUM: Magnesium: 2.6 mg/dL — ABNORMAL HIGH (ref 1.7–2.4)

## 2017-11-28 LAB — GLUCOSE, CAPILLARY: Glucose-Capillary: 172 mg/dL — ABNORMAL HIGH (ref 70–99)

## 2017-11-28 MED ORDER — OXYCODONE-ACETAMINOPHEN 5-325 MG PO TABS: 1.0000 | ORAL_TABLET | Freq: Four times a day (QID) | ORAL | 0 refills | Status: DC | PRN

## 2017-11-28 MED ORDER — DOCUSATE SODIUM 100 MG PO CAPS: 100.0000 mg | ORAL_CAPSULE | Freq: Two times a day (BID) | ORAL | 0 refills | Status: AC

## 2017-11-28 NOTE — Progress Notes (Signed)
S: No issues overnight. Pain well controlled. Tolerating carb modified diet, denies nausea or bloating. Has walked in the halls.   Vitals, labs, intake/output, and orders reviewed at this time.  Gen: A&Ox3, no distress  H&N: EOMI, atraumatic, neck supple Chest: unlabored respirations, RRR Abd: soft, appropriately mildly tender, nondistended, incisions c/d/i with steris Ext: warm, no edema Neuro: grossly normal  Lines/tubes/drains: PIV  A/P:  POD 1 laparoscopic lysis of adhesions and IPOM repair of multiple ventral incisional hernias -Mobilize, pulm toilet -Continue abdominal binder to reduce risk of seroma -Plan discharge today   Romana Juniper, MD [facility], [place] Pager [phone]

## 2017-11-28 NOTE — Discharge Summary (Signed)
Physician Discharge Summary  Patient ID: Raymond Allen MRN: [id number] DOB/AGE: 1960-07-08 57 y.o.  Admit date: 11/27/2017 Discharge date: 11/28/2017  Admission Diagnoses: incarcerated incisional hernia  Discharge Diagnoses:  Active Problems:   Incisional hernia   Discharged Condition: good  Hospital Course: he was admitted for supportive care and observation following extensive laparoscopic lysis of adhesions and repair of multiple ventral incisional hernias. He had no issues overnight and by the morning of postop day 1, he was able to ambulate without assistance, he was tolerating a diet without any nausea or abdominal pain, and a surgical pain was well-controlled. His vital signs had remained normal. He was deemed stable for discharge home.  Consults: None  Significant Diagnostic Studies: labs: see Epic.  Treatments: surgery: see above.  Discharge Exam: Blood pressure 122/86, pulse 77, temperature 97.9 F (36.6 C), temperature source Oral, resp. rate 15, height 6\' 1"  (1.854 m), SpO2 95 %. see rounding note  Disposition: Discharge disposition: 01-Home or Self Care       Discharge Instructions    Call MD for:  persistant dizziness or light-headedness   Complete by:  As directed    Call MD for:  persistant nausea and vomiting   Complete by:  As directed    Call MD for:  redness, tenderness, or signs of infection (pain, swelling, redness, odor or green/yellow discharge around incision site)   Complete by:  As directed    Call MD for:  severe uncontrolled pain   Complete by:  As directed    Call MD for:  temperature >100.4   Complete by:  As directed    Discharge instructions   Complete by:  As directed    Wear abdominal binder at all times. No heavy lifting>20lb for 6 weeks No driving while on narcotic pain medications OK to shower and pat dry. Steri strips will peel off in 1-2 weeks. Remove bandaids on 11/29/17   Increase activity slowly   Complete by:  As  directed      Allergies as of 11/28/2017   No Known Allergies     Medication List    TAKE these medications   amLODipine 5 MG tablet Commonly known as:  NORVASC Take 1 tablet (5 mg total) by mouth daily.   cyclobenzaprine 10 MG tablet Commonly known as:  FLEXERIL Take 10 mg by mouth daily as needed for muscle spasms.   docusate sodium 100 MG capsule Commonly known as:  COLACE Take 1 capsule (100 mg total) by mouth 2 (two) times daily.   gabapentin 300 MG capsule Commonly known as:  NEURONTIN Take 600-900 mg by mouth See admin instructions. Take 600 mg by mouth in the morning and afternoon, then 900mg  at bedtime   glipiZIDE 5 MG 24 hr tablet Commonly known as:  GLUCOTROL XL Take 5 mg by mouth daily.   hydrochlorothiazide 25 MG tablet Commonly known as:  HYDRODIURIL Take 25 mg by mouth daily.   metFORMIN 500 MG 24 hr tablet Commonly known as:  GLUCOPHAGE-XR Take 1,000 mg by mouth 2 (two) times daily.   oxyCODONE-acetaminophen 5-325 MG tablet Commonly known as:  PERCOCET/ROXICET Take 1 tablet by mouth every 6 (six) hours as needed for severe pain.   sildenafil 20 MG tablet Commonly known as:  REVATIO Take 20 mg by mouth daily as needed (ED).   Vitamin D (Ergocalciferol) 50000 units Caps capsule Commonly known as:  DRISDOL Take 50,000 Units by mouth every 7 (seven) days.      Follow-up  Information    Clovis Riley, MD Follow up in 2 week(s).   Specialty:  General Surgery Contact information: [address] [place] [address] [phone]           Signed: Clovis Riley 11/28/2017, 7:10 AM

## 2017-11-28 NOTE — Discharge Instructions (Signed)
HERNIA REPAIR: POST OP INSTRUCTIONS ° °###################################################################### ° °EAT °Gradually transition to a high fiber diet with a fiber supplement over the next few weeks after discharge.  Start with a pureed / full liquid diet (see below) ° °WALK °Walk an hour a day.  Control your pain to do that.   ° °CONTROL PAIN °Control pain so that you can walk, sleep, tolerate sneezing/coughing, and go up/down stairs. ° °HAVE A BOWEL MOVEMENT DAILY °Keep your bowels regular to avoid problems.  OK to try a laxative to override constipation.  OK to use an antidairrheal to slow down diarrhea.  Call if not better after 2 tries ° °CALL IF YOU HAVE PROBLEMS/CONCERNS °Call if you are still struggling despite following these instructions. °Call if you have concerns not answered by these instructions ° °###################################################################### ° ° ° °1. DIET: Follow a light bland diet the first 24 hours after arrival home, such as soup, liquids, crackers, etc.  Be sure to include lots of fluids daily.  Advance to a low fat / high fiber diet over the next few days after surgery.  Avoid fast food or heavy meals the first week as your are more likely to get nauseated.   ° °2. Take your usually prescribed home medications unless otherwise directed. ° °3. PAIN CONTROL: °a. Pain is best controlled by a usual combination of three different methods TOGETHER: °i. Ice/Heat °ii. Over the counter pain medication °iii. Prescription pain medication °b. Most patients will experience some swelling and bruising around the hernia(s) such as the bellybutton, groins, or old incisions.  Ice packs or heating pads (30-60 minutes up to 6 times a day) will help. Use ice for the first few days to help decrease swelling and bruising, then switch to heat to help relax tight/sore spots and speed recovery.  Some people prefer to use ice alone, heat alone, alternating between ice & heat.  Experiment  to what works for you.  Swelling and bruising can take several weeks to resolve.   °c. It is helpful to take an over-the-counter pain medication regularly for the first few weeks.  Choose one of the following that works best for you: °i. Naproxen (Aleve, etc)  Two 220mg tabs twice a day °ii. Ibuprofen (Advil, etc) Three 200mg tabs four times a day (every meal & bedtime) °iii. Acetaminophen (Tylenol, etc) 325-650mg four times a day (every meal & bedtime) °d. A  prescription for pain medication should be given to you upon discharge.  Take your pain medication as prescribed.  °i. If you are having problems/concerns with the prescription medicine (does not control pain, nausea, vomiting, rash, itching, etc), please call us [phone] to see if we need to switch you to a different pain medicine that will work better for you and/or control your side effect better. °ii. If you need a refill on your pain medication, please contact your pharmacy.  They will contact our office to request authorization. Prescriptions will not be filled after 5 pm or on week-ends. ° °4. Avoid getting constipated.  Between the surgery and the pain medications, it is common to experience some constipation.  Increasing fluid intake and taking a fiber supplement (such as Metamucil, Citrucel, FiberCon, MiraLax, etc) 1-2 times a day regularly will usually help prevent this problem from occurring.  A mild laxative (prune juice, Milk of Magnesia, MiraLax, etc) should be taken according to package directions if there are no bowel movements after 48 hours.   ° °5. Wash / shower every   day.  You may shower over the steri dtrips they are waterproof.    6. Remove your bandaids 2 days after surgery. Steri strips will peel off after 1-2 weeks. You may replace a dressing/Band-Aid to cover the incision for comfort if you wish. You may leave the incisions open to air.  You may replace a dressing/Band-Aid to cover an incision for comfort if you wish.   Continue to shower over incision(s) after the dressing is off.  7. ACTIVITIES as tolerated:   a. You may resume regular (light) daily activities beginning the next day--such as daily self-care, walking, climbing stairs--gradually increasing activities as tolerated.  Control your pain so that you can walk an hour a day.  If you can walk 30 minutes without difficulty, it is safe to try more intense activity such as jogging, treadmill, bicycling, low-impact aerobics, swimming, etc. b. Refrain from the most intensive and strenuous activity such as sit-ups, heavy lifting, contact sports, etc  Refrain from any heavy lifting or straining until you are 6 weeks out from surgery c. Wear your abdominal binder at all times.   d. DO NOT PUSH THROUGH PAIN.  Let pain be your guide: If it hurts to do something, don't do it.  Pain is your body warning you to avoid that activity for another week until the pain goes down. e. You may drive when you are no longer taking prescription pain medication, you can comfortably wear a seatbelt, and you can safely maneuver your car and apply brakes. f. You may have sexual intercourse when it is comfortable.   8. FOLLOW UP in our office a. Please call [facility] at [phone] to set up an appointment to see your surgeon in the office for a follow-up appointment approximately 2-3 weeks after your surgery. b. Make sure that you call for this appointment the day you arrive home to insure a convenient appointment time.  9.  If you have disability of FMLA / Family leave forms, please bring the forms to the office for processing.  (do not give to your surgeon).  WHEN TO CALL us [phone]: 1. Poor pain control 2. Reactions / problems with new medications (rash/itching, nausea, etc)  3. Fever over 101.5 F (38.5 C) 4. Inability to urinate 5. Nausea and/or vomiting 6. Worsening swelling or bruising 7. Continued bleeding from incision. 8. Increased pain, redness, or drainage from  the incision   The clinic staff is available to answer your questions during regular business hours (8:30am-5pm).  Please dont hesitate to call and ask to speak to one of our nurses for clinical concerns.   If you have a medical emergency, go to the nearest emergency room or call 911.  A surgeon from [facility] is always on call at the hospitals in [facility], [place], [place], [place], [place]  [address] ?  [address] [address], [place], [place]   [address] MAIN: [phone] ? TOLL FREE: [phone] ? FAX: [phone] [email or web address]

## 2018-06-06 ENCOUNTER — Other Ambulatory Visit: Payer: Self-pay | Admitting: Specialist

## 2018-06-06 DIAGNOSIS — M5417 Radiculopathy, lumbosacral region: Secondary | ICD-10-CM

## 2018-06-13 ENCOUNTER — Ambulatory Visit
Admission: RE | Admit: 2018-06-13 | Discharge: 2018-06-13 | Disposition: A | Discharge status: Home / Self Care | Payer: Medicare Other | Source: Ambulatory Visit | Attending: Specialist | Admitting: Specialist

## 2018-06-13 DIAGNOSIS — M5417 Radiculopathy, lumbosacral region: Secondary | ICD-10-CM

## 2019-05-09 ENCOUNTER — Other Ambulatory Visit: Payer: Self-pay | Admitting: Specialist

## 2019-05-09 DIAGNOSIS — M5416 Radiculopathy, lumbar region: Secondary | ICD-10-CM

## 2019-05-21 ENCOUNTER — Ambulatory Visit
Admission: RE | Admit: 2019-05-21 | Discharge: 2019-05-21 | Disposition: A | Discharge status: Home / Self Care | Payer: Medicare HMO | Source: Ambulatory Visit | Attending: Specialist | Admitting: Specialist

## 2019-05-21 DIAGNOSIS — M5416 Radiculopathy, lumbar region: Secondary | ICD-10-CM

## 2019-12-09 ENCOUNTER — Other Ambulatory Visit: Payer: Self-pay | Admitting: Nurse Practitioner

## 2019-12-09 DIAGNOSIS — R1031 Right lower quadrant pain: Secondary | ICD-10-CM

## 2019-12-09 DIAGNOSIS — R102 Pelvic and perineal pain: Secondary | ICD-10-CM

## 2019-12-15 ENCOUNTER — Other Ambulatory Visit: Payer: Self-pay | Admitting: Nurse Practitioner

## 2019-12-17 ENCOUNTER — Other Ambulatory Visit: Payer: Self-pay

## 2019-12-17 ENCOUNTER — Ambulatory Visit
Admission: RE | Admit: 2019-12-17 | Discharge: 2019-12-17 | Disposition: A | Discharge status: Home / Self Care | Payer: Medicare HMO | Source: Ambulatory Visit | Attending: Nurse Practitioner | Admitting: Nurse Practitioner

## 2019-12-17 DIAGNOSIS — R102 Pelvic and perineal pain: Secondary | ICD-10-CM

## 2019-12-17 DIAGNOSIS — R1031 Right lower quadrant pain: Secondary | ICD-10-CM

## 2019-12-17 MED ORDER — IOPAMIDOL (ISOVUE-300) INJECTION 61%
100.0000 mL | Freq: Once | INTRAVENOUS | Status: AC | PRN
  Administered 2019-12-17: 100 mL via INTRAVENOUS

## 2021-03-29 ENCOUNTER — Inpatient Hospital Stay (HOSPITAL_COMMUNITY)
Admission: EM | Admit: 2021-03-29 | Discharge: 2021-04-11 | DRG: 286 | Disposition: A | Discharge status: Home Health | Payer: Medicare HMO | Attending: Internal Medicine | Admitting: Internal Medicine

## 2021-03-29 ENCOUNTER — Inpatient Hospital Stay (HOSPITAL_COMMUNITY): Payer: Medicare HMO

## 2021-03-29 ENCOUNTER — Other Ambulatory Visit: Payer: Self-pay

## 2021-03-29 ENCOUNTER — Emergency Department (HOSPITAL_COMMUNITY): Payer: Medicare HMO

## 2021-03-29 ENCOUNTER — Encounter (HOSPITAL_COMMUNITY): Payer: Self-pay

## 2021-03-29 DIAGNOSIS — E1140 Type 2 diabetes mellitus with diabetic neuropathy, unspecified: Secondary | ICD-10-CM | POA: Diagnosis present

## 2021-03-29 DIAGNOSIS — Z7984 Long term (current) use of oral hypoglycemic drugs: Secondary | ICD-10-CM

## 2021-03-29 DIAGNOSIS — I5043 Acute on chronic combined systolic (congestive) and diastolic (congestive) heart failure: Secondary | ICD-10-CM

## 2021-03-29 DIAGNOSIS — Z20822 Contact with and (suspected) exposure to covid-19: Secondary | ICD-10-CM | POA: Diagnosis present

## 2021-03-29 DIAGNOSIS — R570 Cardiogenic shock: Secondary | ICD-10-CM | POA: Diagnosis present

## 2021-03-29 DIAGNOSIS — Z4659 Encounter for fitting and adjustment of other gastrointestinal appliance and device: Secondary | ICD-10-CM

## 2021-03-29 DIAGNOSIS — R0602 Shortness of breath: Secondary | ICD-10-CM | POA: Diagnosis present

## 2021-03-29 DIAGNOSIS — Z87891 Personal history of nicotine dependence: Secondary | ICD-10-CM

## 2021-03-29 DIAGNOSIS — N183 Chronic kidney disease, stage 3 unspecified: Secondary | ICD-10-CM

## 2021-03-29 DIAGNOSIS — R0603 Acute respiratory distress: Secondary | ICD-10-CM | POA: Diagnosis not present

## 2021-03-29 DIAGNOSIS — J9600 Acute respiratory failure, unspecified whether with hypoxia or hypercapnia: Secondary | ICD-10-CM

## 2021-03-29 DIAGNOSIS — J9601 Acute respiratory failure with hypoxia: Secondary | ICD-10-CM | POA: Diagnosis present

## 2021-03-29 DIAGNOSIS — A419 Sepsis, unspecified organism: Secondary | ICD-10-CM | POA: Diagnosis not present

## 2021-03-29 DIAGNOSIS — Z9049 Acquired absence of other specified parts of digestive tract: Secondary | ICD-10-CM

## 2021-03-29 DIAGNOSIS — Z85038 Personal history of other malignant neoplasm of large intestine: Secondary | ICD-10-CM

## 2021-03-29 DIAGNOSIS — Z781 Physical restraint status: Secondary | ICD-10-CM

## 2021-03-29 DIAGNOSIS — J154 Pneumonia due to other streptococci: Secondary | ICD-10-CM | POA: Diagnosis present

## 2021-03-29 DIAGNOSIS — I420 Dilated cardiomyopathy: Secondary | ICD-10-CM | POA: Diagnosis present

## 2021-03-29 DIAGNOSIS — I340 Nonrheumatic mitral (valve) insufficiency: Secondary | ICD-10-CM | POA: Diagnosis present

## 2021-03-29 DIAGNOSIS — Z86718 Personal history of other venous thrombosis and embolism: Secondary | ICD-10-CM | POA: Diagnosis not present

## 2021-03-29 DIAGNOSIS — G931 Anoxic brain damage, not elsewhere classified: Secondary | ICD-10-CM | POA: Diagnosis present

## 2021-03-29 DIAGNOSIS — I130 Hypertensive heart and chronic kidney disease with heart failure and stage 1 through stage 4 chronic kidney disease, or unspecified chronic kidney disease: Secondary | ICD-10-CM | POA: Diagnosis present

## 2021-03-29 DIAGNOSIS — E876 Hypokalemia: Secondary | ICD-10-CM | POA: Diagnosis present

## 2021-03-29 DIAGNOSIS — I4892 Unspecified atrial flutter: Secondary | ICD-10-CM | POA: Diagnosis not present

## 2021-03-29 DIAGNOSIS — T502X5A Adverse effect of carbonic-anhydrase inhibitors, benzothiadiazides and other diuretics, initial encounter: Secondary | ICD-10-CM | POA: Diagnosis present

## 2021-03-29 DIAGNOSIS — I10 Essential (primary) hypertension: Secondary | ICD-10-CM | POA: Diagnosis not present

## 2021-03-29 DIAGNOSIS — I498 Other specified cardiac arrhythmias: Secondary | ICD-10-CM

## 2021-03-29 DIAGNOSIS — I5023 Acute on chronic systolic (congestive) heart failure: Secondary | ICD-10-CM | POA: Diagnosis present

## 2021-03-29 DIAGNOSIS — J810 Acute pulmonary edema: Secondary | ICD-10-CM | POA: Diagnosis not present

## 2021-03-29 DIAGNOSIS — G9341 Metabolic encephalopathy: Secondary | ICD-10-CM

## 2021-03-29 DIAGNOSIS — J9690 Respiratory failure, unspecified, unspecified whether with hypoxia or hypercapnia: Secondary | ICD-10-CM

## 2021-03-29 DIAGNOSIS — R140 Abdominal distension (gaseous): Secondary | ICD-10-CM

## 2021-03-29 DIAGNOSIS — C787 Secondary malignant neoplasm of liver and intrahepatic bile duct: Secondary | ICD-10-CM | POA: Diagnosis present

## 2021-03-29 DIAGNOSIS — R6521 Severe sepsis with septic shock: Secondary | ICD-10-CM | POA: Diagnosis not present

## 2021-03-29 DIAGNOSIS — I509 Heart failure, unspecified: Secondary | ICD-10-CM | POA: Diagnosis not present

## 2021-03-29 DIAGNOSIS — N1831 Chronic kidney disease, stage 3a: Secondary | ICD-10-CM | POA: Diagnosis not present

## 2021-03-29 DIAGNOSIS — Z7901 Long term (current) use of anticoagulants: Secondary | ICD-10-CM

## 2021-03-29 DIAGNOSIS — J811 Chronic pulmonary edema: Secondary | ICD-10-CM

## 2021-03-29 DIAGNOSIS — Z6838 Body mass index (BMI) 38.0-38.9, adult: Secondary | ICD-10-CM

## 2021-03-29 DIAGNOSIS — E785 Hyperlipidemia, unspecified: Secondary | ICD-10-CM | POA: Diagnosis present

## 2021-03-29 DIAGNOSIS — E669 Obesity, unspecified: Secondary | ICD-10-CM | POA: Diagnosis present

## 2021-03-29 DIAGNOSIS — I872 Venous insufficiency (chronic) (peripheral): Secondary | ICD-10-CM | POA: Diagnosis present

## 2021-03-29 DIAGNOSIS — N179 Acute kidney failure, unspecified: Secondary | ICD-10-CM | POA: Diagnosis present

## 2021-03-29 DIAGNOSIS — N182 Chronic kidney disease, stage 2 (mild): Secondary | ICD-10-CM | POA: Diagnosis present

## 2021-03-29 DIAGNOSIS — I2510 Atherosclerotic heart disease of native coronary artery without angina pectoris: Secondary | ICD-10-CM | POA: Diagnosis present

## 2021-03-29 DIAGNOSIS — E119 Type 2 diabetes mellitus without complications: Secondary | ICD-10-CM

## 2021-03-29 DIAGNOSIS — E1165 Type 2 diabetes mellitus with hyperglycemia: Secondary | ICD-10-CM | POA: Diagnosis present

## 2021-03-29 DIAGNOSIS — E1122 Type 2 diabetes mellitus with diabetic chronic kidney disease: Secondary | ICD-10-CM | POA: Diagnosis present

## 2021-03-29 DIAGNOSIS — I5021 Acute systolic (congestive) heart failure: Secondary | ICD-10-CM | POA: Diagnosis not present

## 2021-03-29 DIAGNOSIS — Z0189 Encounter for other specified special examinations: Secondary | ICD-10-CM

## 2021-03-29 DIAGNOSIS — Z9221 Personal history of antineoplastic chemotherapy: Secondary | ICD-10-CM

## 2021-03-29 DIAGNOSIS — Z79899 Other long term (current) drug therapy: Secondary | ICD-10-CM

## 2021-03-29 LAB — BASIC METABOLIC PANEL
Anion gap: 9 (ref 5–15)
BUN: 20 mg/dL (ref 8–23)
CO2: 26 mmol/L (ref 22–32)
Calcium: 9 mg/dL (ref 8.9–10.3)
Chloride: 101 mmol/L (ref 98–111)
Creatinine, Ser: 1.59 mg/dL — ABNORMAL HIGH (ref 0.61–1.24)
GFR, Estimated: 49 mL/min — ABNORMAL LOW (ref >60)
Glucose, Bld: 154 mg/dL — ABNORMAL HIGH (ref 70–99)
Potassium: 4.1 mmol/L (ref 3.5–5.1)
Sodium: 136 mmol/L (ref 135–145)

## 2021-03-29 LAB — CBC WITH DIFFERENTIAL/PLATELET
Abs Immature Granulocytes: 0.05 10*3/uL (ref 0.00–0.07)
Basophils Absolute: 0.1 10*3/uL (ref 0.0–0.1)
Basophils Relative: 1 %
Eosinophils Absolute: 0.1 10*3/uL (ref 0.0–0.5)
Eosinophils Relative: 1 %
HCT: 43 % (ref 39.0–52.0)
Hemoglobin: 12.8 g/dL — ABNORMAL LOW (ref 13.0–17.0)
Immature Granulocytes: 1 %
Lymphocytes Relative: 28 %
Lymphs Abs: 2.4 10*3/uL (ref 0.7–4.0)
MCH: 24.9 pg — ABNORMAL LOW (ref 26.0–34.0)
MCHC: 29.8 g/dL — ABNORMAL LOW (ref 30.0–36.0)
MCV: 83.7 fL (ref 80.0–100.0)
Monocytes Absolute: 0.7 10*3/uL (ref 0.1–1.0)
Monocytes Relative: 8 %
Neutro Abs: 5.2 10*3/uL (ref 1.7–7.7)
Neutrophils Relative %: 61 %
Platelets: 260 10*3/uL (ref 150–400)
RBC: 5.14 MIL/uL (ref 4.22–5.81)
RDW: 15.7 % — ABNORMAL HIGH (ref 11.5–15.5)
WBC: 8.5 10*3/uL (ref 4.0–10.5)
nRBC: 0 % (ref 0.0–0.2)

## 2021-03-29 LAB — ECHOCARDIOGRAM COMPLETE
AR max vel: 2.92 cm2
AV Peak grad: 3.4 mmHg
Ao pk vel: 0.92 m/s
Area-P 1/2: 6.12 cm2
Calc EF: 31.1 %
Height: 73 in
MV M vel: 4.1 m/s
MV Peak grad: 67.2 mmHg
P 1/2 time: 496 msec
S' Lateral: 6 cm
Single Plane A2C EF: 30.7 %
Single Plane A4C EF: 31.4 %
Weight: 4608 oz

## 2021-03-29 LAB — CBG MONITORING, ED
Glucose-Capillary: 149 mg/dL — ABNORMAL HIGH (ref 70–99)
Glucose-Capillary: 102 mg/dL — ABNORMAL HIGH (ref 70–99)

## 2021-03-29 LAB — I-STAT ARTERIAL BLOOD GAS, ED
Acid-Base Excess: 3 mmol/L — ABNORMAL HIGH (ref 0.0–2.0)
Bicarbonate: 28.2 mmol/L — ABNORMAL HIGH (ref 20.0–28.0)
Calcium, Ion: 1.18 mmol/L (ref 1.15–1.40)
HCT: 40 % (ref 39.0–52.0)
Hemoglobin: 13.6 g/dL (ref 13.0–17.0)
O2 Saturation: 98 %
Patient temperature: 98
Potassium: 3.1 mmol/L — ABNORMAL LOW (ref 3.5–5.1)
Sodium: 141 mmol/L (ref 135–145)
TCO2: 30 mmol/L (ref 22–32)
pCO2 arterial: 44.8 mmHg (ref 32.0–48.0)
pH, Arterial: 7.405 (ref 7.350–7.450)
pO2, Arterial: 102 mmHg (ref 83.0–108.0)

## 2021-03-29 LAB — HEMOGLOBIN A1C
Hgb A1c MFr Bld: 7.7 % — ABNORMAL HIGH (ref 4.8–5.6)
Mean Plasma Glucose: 174.29 mg/dL

## 2021-03-29 LAB — COOXEMETRY PANEL
Carboxyhemoglobin: 1.1 % (ref 0.5–1.5)
Methemoglobin: 0.8 % (ref 0.0–1.5)
O2 Saturation: 76.9 %
Total hemoglobin: 12.6 g/dL (ref 12.0–16.0)

## 2021-03-29 LAB — TROPONIN I (HIGH SENSITIVITY)
Troponin I (High Sensitivity): 44 ng/L — ABNORMAL HIGH (ref <18)
Troponin I (High Sensitivity): 39 ng/L — ABNORMAL HIGH (ref <18)

## 2021-03-29 LAB — BRAIN NATRIURETIC PEPTIDE: B Natriuretic Peptide: 391.6 pg/mL — ABNORMAL HIGH (ref 0.0–100.0)

## 2021-03-29 LAB — GLUCOSE, CAPILLARY
Glucose-Capillary: 100 mg/dL — ABNORMAL HIGH (ref 70–99)
Glucose-Capillary: 113 mg/dL — ABNORMAL HIGH (ref 70–99)
Glucose-Capillary: 134 mg/dL — ABNORMAL HIGH (ref 70–99)

## 2021-03-29 LAB — RESP PANEL BY RT-PCR (FLU A&B, COVID) ARPGX2
Influenza A by PCR: NEGATIVE
Influenza B by PCR: NEGATIVE
SARS Coronavirus 2 by RT PCR: NEGATIVE

## 2021-03-29 LAB — MRSA NEXT GEN BY PCR, NASAL: MRSA by PCR Next Gen: NOT DETECTED

## 2021-03-29 LAB — HIV ANTIBODY (ROUTINE TESTING W REFLEX): HIV Screen 4th Generation wRfx: NONREACTIVE

## 2021-03-29 MED ORDER — RIVAROXABAN 20 MG PO TABS
20.0000 mg | ORAL_TABLET | Freq: Every day | ORAL | Status: DC
Start: 2021-03-29 — End: 2021-03-31
  Administered 2021-03-29 – 2021-03-30 (×2): 20 mg
  Filled 2021-03-29 (×2): qty 1

## 2021-03-29 MED ORDER — FENTANYL CITRATE PF 50 MCG/ML IJ SOSY
PREFILLED_SYRINGE | INTRAMUSCULAR | Status: AC
  Filled 2021-03-29: qty 2

## 2021-03-29 MED ORDER — FUROSEMIDE 10 MG/ML IJ SOLN
80.0000 mg | Freq: Two times a day (BID) | INTRAMUSCULAR | Status: DC
  Administered 2021-03-29 – 2021-03-30 (×4): 80 mg via INTRAVENOUS
  Filled 2021-03-29 (×5): qty 8

## 2021-03-29 MED ORDER — GABAPENTIN 300 MG PO CAPS: 900.0000 mg | ORAL_CAPSULE | Freq: Every day | ORAL | Status: DC

## 2021-03-29 MED ORDER — FENTANYL CITRATE (PF) 100 MCG/2ML IJ SOLN
INTRAMUSCULAR | Status: AC | PRN
  Administered 2021-03-29: 50 ug via INTRAVENOUS

## 2021-03-29 MED ORDER — FENTANYL 2500MCG IN NS 250ML (10MCG/ML) PREMIX INFUSION
50.0000 ug/h | INTRAVENOUS | Status: DC
  Administered 2021-03-29: 50 ug/h via INTRAVENOUS
  Administered 2021-03-30 – 2021-04-01 (×3): 100 ug/h via INTRAVENOUS
  Administered 2021-04-02 – 2021-04-05 (×5): 150 ug/h via INTRAVENOUS
  Filled 2021-03-29 (×9): qty 250

## 2021-03-29 MED ORDER — MIDAZOLAM HCL 2 MG/2ML IJ SOLN
INTRAMUSCULAR | Status: AC
  Administered 2021-03-29: 2 mg
  Filled 2021-03-29: qty 2

## 2021-03-29 MED ORDER — NOREPINEPHRINE 4 MG/250ML-% IV SOLN
0.0000 ug/min | INTRAVENOUS | Status: DC
  Administered 2021-03-29: 13:00:00 10 ug/min via INTRAVENOUS
  Administered 2021-03-30: 04:00:00 7 ug/min via INTRAVENOUS
  Administered 2021-03-31 – 2021-04-01 (×4): 8 ug/min via INTRAVENOUS
  Filled 2021-03-29 (×8): qty 250

## 2021-03-29 MED ORDER — DOCUSATE SODIUM 100 MG PO CAPS: 100.0000 mg | ORAL_CAPSULE | Freq: Two times a day (BID) | ORAL | Status: DC | PRN

## 2021-03-29 MED ORDER — PROPOFOL 1000 MG/100ML IV EMUL
0.0000 ug/kg/min | INTRAVENOUS | Status: DC
  Administered 2021-03-29 (×2): 30 ug/kg/min via INTRAVENOUS
  Administered 2021-03-29: 40 ug/kg/min via INTRAVENOUS
  Administered 2021-03-29: 11:00:00 5 ug/kg/min via INTRAVENOUS
  Administered 2021-03-30 (×2): 40 ug/kg/min via INTRAVENOUS
  Administered 2021-03-30: 04:00:00 30 ug/kg/min via INTRAVENOUS
  Administered 2021-03-30 – 2021-03-31 (×12): 40 ug/kg/min via INTRAVENOUS
  Administered 2021-04-01: 20 ug/kg/min via INTRAVENOUS
  Administered 2021-04-01: 40 ug/kg/min via INTRAVENOUS
  Administered 2021-04-01: 29.99 ug/kg/min via INTRAVENOUS
  Administered 2021-04-01: 30 ug/kg/min via INTRAVENOUS
  Administered 2021-04-01: 35 ug/kg/min via INTRAVENOUS
  Administered 2021-04-02: 20 ug/kg/min via INTRAVENOUS
  Administered 2021-04-02 (×2): 35 ug/kg/min via INTRAVENOUS
  Administered 2021-04-02: 15 ug/kg/min via INTRAVENOUS
  Administered 2021-04-03: 15:00:00 20 ug/kg/min via INTRAVENOUS
  Administered 2021-04-03: 15 ug/kg/min via INTRAVENOUS
  Administered 2021-04-03: 21:00:00 20 ug/kg/min via INTRAVENOUS
  Administered 2021-04-04: 17:00:00 15 ug/kg/min via INTRAVENOUS
  Administered 2021-04-04: 03:00:00 20 ug/kg/min via INTRAVENOUS
  Administered 2021-04-05: 01:00:00 15 ug/kg/min via INTRAVENOUS
  Administered 2021-04-05: 06:00:00 25 ug/kg/min via INTRAVENOUS
  Filled 2021-03-29 (×5): qty 100
  Filled 2021-03-29: qty 200
  Filled 2021-03-29 (×7): qty 100
  Filled 2021-03-29: qty 200
  Filled 2021-03-29 (×7): qty 100
  Filled 2021-03-29: qty 200
  Filled 2021-03-29 (×11): qty 100

## 2021-03-29 MED ORDER — CYCLOBENZAPRINE HCL 10 MG PO TABS: 10.0000 mg | ORAL_TABLET | Freq: Three times a day (TID) | ORAL | Status: DC | PRN

## 2021-03-29 MED ORDER — NITROGLYCERIN IN D5W 200-5 MCG/ML-% IV SOLN
0.0000 ug/min | INTRAVENOUS | Status: DC
  Filled 2021-03-29: qty 250

## 2021-03-29 MED ORDER — GABAPENTIN 300 MG PO CAPS: 600.0000 mg | ORAL_CAPSULE | Freq: Two times a day (BID) | ORAL | Status: DC

## 2021-03-29 MED ORDER — GABAPENTIN 250 MG/5ML PO SOLN
900.0000 mg | Freq: Every day | ORAL | Status: DC
  Administered 2021-03-29 – 2021-04-04 (×7): 900 mg
  Filled 2021-03-29 (×8): qty 18

## 2021-03-29 MED ORDER — FUROSEMIDE 10 MG/ML IJ SOLN
40.0000 mg | Freq: Once | INTRAMUSCULAR | Status: AC
  Administered 2021-03-29: 40 mg via INTRAVENOUS
  Filled 2021-03-29: qty 4

## 2021-03-29 MED ORDER — CHLORHEXIDINE GLUCONATE 0.12% ORAL RINSE (MEDLINE KIT)
15.0000 mL | Freq: Two times a day (BID) | OROMUCOSAL | Status: DC
  Administered 2021-03-29 – 2021-04-07 (×18): 15 mL via OROMUCOSAL

## 2021-03-29 MED ORDER — RIVAROXABAN 10 MG PO TABS: 20.0000 mg | ORAL_TABLET | Freq: Every day | ORAL | Status: DC

## 2021-03-29 MED ORDER — POLYETHYLENE GLYCOL 3350 17 G PO PACK
17.0000 g | PACK | Freq: Every day | ORAL | Status: DC | PRN
  Administered 2021-04-04: 22:00:00 17 g via ORAL
  Filled 2021-03-29: qty 1

## 2021-03-29 MED ORDER — ROCURONIUM BROMIDE 50 MG/5ML IV SOLN
INTRAVENOUS | Status: AC | PRN
  Administered 2021-03-29: 100 mg via INTRAVENOUS

## 2021-03-29 MED ORDER — CHLORHEXIDINE GLUCONATE CLOTH 2 % EX PADS
6.0000 | MEDICATED_PAD | Freq: Every day | CUTANEOUS | Status: DC
  Administered 2021-03-29 – 2021-04-06 (×9): 6 via TOPICAL

## 2021-03-29 MED ORDER — FENTANYL CITRATE PF 50 MCG/ML IJ SOSY
50.0000 ug | PREFILLED_SYRINGE | Freq: Once | INTRAMUSCULAR | Status: DC
  Filled 2021-03-29: qty 1

## 2021-03-29 MED ORDER — INSULIN ASPART 100 UNIT/ML IJ SOLN
0.0000 [IU] | INTRAMUSCULAR | Status: DC
  Administered 2021-03-29 (×2): 3 [IU] via SUBCUTANEOUS
  Administered 2021-03-30: 20:00:00 4 [IU] via SUBCUTANEOUS
  Administered 2021-03-30: 08:00:00 3 [IU] via SUBCUTANEOUS
  Administered 2021-03-31 (×5): 4 [IU] via SUBCUTANEOUS
  Administered 2021-03-31: 12:00:00 3 [IU] via SUBCUTANEOUS
  Administered 2021-03-31: 4 [IU] via SUBCUTANEOUS
  Administered 2021-04-01: 3 [IU] via SUBCUTANEOUS
  Administered 2021-04-01 (×4): 4 [IU] via SUBCUTANEOUS
  Administered 2021-04-02: 7 [IU] via SUBCUTANEOUS
  Administered 2021-04-02 (×2): 4 [IU] via SUBCUTANEOUS
  Administered 2021-04-02: 7 [IU] via SUBCUTANEOUS
  Administered 2021-04-02 – 2021-04-03 (×5): 4 [IU] via SUBCUTANEOUS
  Administered 2021-04-03: 12:00:00 7 [IU] via SUBCUTANEOUS
  Administered 2021-04-03: 16:00:00 4 [IU] via SUBCUTANEOUS
  Administered 2021-04-03: 04:00:00 7 [IU] via SUBCUTANEOUS
  Administered 2021-04-04 (×3): 4 [IU] via SUBCUTANEOUS
  Administered 2021-04-04: 12:00:00 7 [IU] via SUBCUTANEOUS
  Administered 2021-04-04: 4 [IU] via SUBCUTANEOUS
  Administered 2021-04-04: 04:00:00 7 [IU] via SUBCUTANEOUS
  Administered 2021-04-05: 16:00:00 4 [IU] via SUBCUTANEOUS
  Administered 2021-04-05: 3 [IU] via SUBCUTANEOUS
  Administered 2021-04-05: 04:00:00 7 [IU] via SUBCUTANEOUS
  Administered 2021-04-05: 09:00:00 4 [IU] via SUBCUTANEOUS
  Administered 2021-04-05: 20:00:00 3 [IU] via SUBCUTANEOUS
  Administered 2021-04-05 – 2021-04-06 (×3): 4 [IU] via SUBCUTANEOUS

## 2021-03-29 MED ORDER — PERFLUTREN LIPID MICROSPHERE
1.0000 mL | INTRAVENOUS | Status: AC | PRN
Start: 2021-03-29 — End: 2021-03-29
  Administered 2021-03-29: 2 mL via INTRAVENOUS
  Filled 2021-03-29: qty 10

## 2021-03-29 MED ORDER — POTASSIUM CHLORIDE 20 MEQ PO PACK: 40.0000 meq | PACK | Freq: Once | ORAL | Status: DC

## 2021-03-29 MED ORDER — FENTANYL BOLUS VIA INFUSION
50.0000 ug | INTRAVENOUS | Status: DC | PRN
  Administered 2021-03-29: 50 ug via INTRAVENOUS
  Administered 2021-03-30 – 2021-04-02 (×3): 100 ug via INTRAVENOUS
  Administered 2021-04-02 – 2021-04-03 (×2): 50 ug via INTRAVENOUS
  Administered 2021-04-03: 01:00:00 100 ug via INTRAVENOUS
  Administered 2021-04-04: 08:00:00 50 ug via INTRAVENOUS
  Filled 2021-03-29: qty 100

## 2021-03-29 MED ORDER — ETOMIDATE 2 MG/ML IV SOLN
INTRAVENOUS | Status: AC | PRN
  Administered 2021-03-29: 20 mg via INTRAVENOUS

## 2021-03-29 MED ORDER — POTASSIUM CHLORIDE 20 MEQ PO PACK
40.0000 meq | PACK | ORAL | Status: AC
  Administered 2021-03-29 (×2): 40 meq
  Filled 2021-03-29 (×2): qty 2

## 2021-03-29 MED ORDER — ORAL CARE MOUTH RINSE
15.0000 mL | OROMUCOSAL | Status: DC
  Administered 2021-03-29 – 2021-04-05 (×67): 15 mL via OROMUCOSAL

## 2021-03-29 MED ORDER — GABAPENTIN 250 MG/5ML PO SOLN
600.0000 mg | Freq: Two times a day (BID) | ORAL | Status: DC
  Administered 2021-03-30 – 2021-04-06 (×15): 600 mg
  Filled 2021-03-29 (×20): qty 12

## 2021-03-29 NOTE — Procedures (Signed)
Central Venous Catheter Insertion Procedure Note  Raymond Allen  [id number]  Jan 01, 1961  Date:03/29/21  Time:10:57 AM   Provider Performing:Asheton Scheffler Loletha Grayer Tamala Julian   Procedure: Insertion of Non-tunneled Central Venous [phone]) with US guidance ([address])   Indication(s) Medication administration  Consent Verbal wife at bedside  Anesthesia Topical only with 1% lidocaine   Timeout Verified patient identification, verified procedure, site/side was marked, verified correct patient position, special equipment/implants available, medications/allergies/relevant history reviewed, required imaging and test results available.  Sterile Technique Maximal sterile technique including full sterile barrier drape, hand hygiene, sterile gown, sterile gloves, mask, hair covering, sterile ultrasound probe cover (if used).  Procedure Description Area of catheter insertion was cleaned with chlorhexidine and draped in sterile fashion.  With real-time ultrasound guidance a central venous catheter was placed into the left internal jugular vein. Nonpulsatile blood flow and easy flushing noted in all ports.  The catheter was sutured in place and sterile dressing applied.  Complications/Tolerance None; patient tolerated the procedure well. Chest X-ray is ordered to verify placement for internal jugular or subclavian cannulation.   Chest x-ray is not ordered for femoral cannulation.  EBL Minimal  Specimen(s) None

## 2021-03-29 NOTE — H&P (Signed)
NAME:  Raymond Allen, MRN:  [id number], DOB:  11/15/1960, LOS: 0 ADMISSION DATE:  03/29/2021, CONSULTATION DATE:  03/29/21 REFERRING MD:  EDP, CHIEF COMPLAINT:  Shortness of breath    History of Present Illness:  Raymond Allen is a 60 y.o. M with PMH of DM, DVT on Xarelto, HTN, colon Ca s/p partial colectomy 2001 and mets to the liver s/p L hepatectomy who presented with several weeks of shortness of breath and lower extremity swelling and coughing productive of white sputum.  No fever or chills.  Denies history of heart failure or heart problems.  In the ED cxr was consistent with volume overload, he was given Lasix 40mg  and started on Bipap.  PCCM consulted for evaluation.   Pertinent  Medical History   has a past medical history of Cancer [facility]), Colon cancer metastasized to liver [facility]) (01/05/2012), Colon carcinoma ([place]), Diabetes mellitus without complication ([place]), Hypertension, and Muscle weakness-general (01/05/2012).   Significant Hospital Events: Including procedures, antibiotic start and stop dates in addition to other pertinent events   12/13 Dyspnea secondary to volume overload, PCCM admit  Interim History / Subjective:  Pt sitting up, tolerating Bipap, feels ok, making urine  Objective   Blood pressure 131/89, pulse (!) 112, temperature 98 F (36.7 C), resp. rate (!) 27, height 6\' 1"  (1.854 m), weight 130.6 kg, SpO2 96 %.       No intake or output data in the 24 hours ending 03/29/21 0733 Filed Weights   03/29/21 0446  Weight: 130.6 kg   General:  overweight M, sitting up on the side of the bed, awake, appears moderately distressed HEENT: MM pink/moist, sclera anicteric  Neuro: awake, alert, oriented, moving all extremities CV: s1s2 tachycardic, regular, no m/r/g PULM:  crackles all fields bilaterally, dyspneic on bipap, able to answer questions and tolerating bipap mask GI: soft, non-tender Extremities: warm/dry, 3+ edema  Skin: no rashes or lesions   Resolved  Hospital Problem list     Assessment & Plan:    Acute Hypoxic Respiratory Failure Likely secondary to acute heart failure exacerbation Denies known history of cardiac problems, no prior echo -continue Bipap, tolerating currently on 80%, would want intubation if necessary -stat echo -Diurese aggressively with Lasix 80mg  bid -place foley, monitor response -follow K, replete as needed -consider PE with hx of DVT, but compliant with Xarelto and clinical picture suggestive of pulmonary edema     HTN, HL -hold home anti-hypertensives for now with the initiation of diuretics, resume as needed     Hx of Lower extremity DVT -continue Xarelto, has LE edema but most consistent with volume overload and unlikely new DVT as compliant with anti-coagulation    Hx of Colon Ca with Mets to the liver S/p partial colectomy and partial hepatectomy in 2001 -stat echo to ensure no malignant peri-cardial effusion  Type 2 DM -hold home Metformin, Ozempic, Tresiba -SSI and A1c    Best Practice (right click and "Reselect all SmartList Selections" daily)   Diet/type: Regular consistency (see orders) DVT prophylaxis: DOAC GI prophylaxis: N/A Lines: N/A Foley:  Yes, and it is still needed Code Status:  full code Last date of multidisciplinary goals of care discussion [12/13 family updated at the bedside, pt confirms would want intubated if needed]  Labs   CBC: Recent Labs  Lab 03/29/21 0501  WBC 8.5  NEUTROABS 5.2  HGB 12.8*  HCT 43.0  MCV 83.7  PLT 517    Basic Metabolic Panel: Recent Labs  Lab  03/29/21 0501  NA 136  K 4.1  CL 101  CO2 26  GLUCOSE 154*  BUN 20  CREATININE 1.59*  CALCIUM 9.0   GFR: Estimated Creatinine Clearance: 68.3 mL/min (A) (by C-G formula based on SCr of 1.59 mg/dL (H)). Recent Labs  Lab 03/29/21 0501  WBC 8.5    Liver Function Tests: No results for input(s): AST, ALT, ALKPHOS, BILITOT, PROT, ALBUMIN in the last 168 hours. No results for  input(s): LIPASE, AMYLASE in the last 168 hours. No results for input(s): AMMONIA in the last 168 hours.  ABG    Component Value Date/Time   PHART 7.438 11/29/2007 1846   PCO2ART 37.8 11/29/2007 1846   PO2ART 99.0 11/29/2007 1846   HCO3 25.5 (H) 11/29/2007 1846   TCO2 30 01/04/2010 1043   ACIDBASEDEF 7.0 (H) 11/29/2007 0320   O2SAT 98.0 11/29/2007 1846     Coagulation Profile: No results for input(s): INR, PROTIME in the last 168 hours.  Cardiac Enzymes: No results for input(s): CKTOTAL, CKMB, CKMBINDEX, TROPONINI in the last 168 hours.  HbA1C: Hgb A1c MFr Bld  Date/Time Value Ref Range Status  11/22/2017 11:48 AM 8.3 (H) 4.8 - 5.6 % Final    Comment:    (NOTE) Pre diabetes:          5.7%-6.4% Diabetes:              >6.4% Glycemic control for   <7.0% adults with diabetes   01/10/2008 10:07 AM 6.1 (H) 4.6 - 6.0 % Final    Comment:    See lab report for associated comment(s)    CBG: No results for input(s): GLUCAP in the last 168 hours.  Review of Systems:   Review of Systems  Constitutional: Negative.   Respiratory:  Positive for cough, sputum production and shortness of breath. Negative for hemoptysis.   Cardiovascular:  Positive for orthopnea and leg swelling. Negative for chest pain.  Gastrointestinal: Negative.   Musculoskeletal: Negative.     Past Medical History:  He,  has a past medical history of Cancer [facility]), Colon cancer metastasized to liver [facility]) (01/05/2012), Colon carcinoma ([place]), Diabetes mellitus without complication ([place]), Hypertension, and Muscle weakness-general (01/05/2012).   Surgical History:   Past Surgical History:  Procedure Laterality Date   CHOLECYSTECTOMY     COLON SURGERY     sigmoid colectomy, appendectomy   INCISIONAL HERNIA REPAIR N/A 11/27/2017   Procedure: LAPAROSCOPIC ASSISTED INCISIONAL HERNIA;  Surgeon: Clovis Riley, MD;  Location: [facility];  Service: General;  Laterality: N/A;   INSERTION OF MESH N/A 11/27/2017    Procedure: INSERTION OF MESH;  Surgeon: Clovis Riley, MD;  Location: [facility];  Service: General;  Laterality: N/A;   LAPAROSCOPIC LYSIS OF ADHESIONS N/A 11/27/2017   Procedure: LAPAROSCOPIC LYSIS OF ADHESIONS;  Surgeon: Clovis Riley, MD;  Location: [facility];  Service: General;  Laterality: N/A;   LIVER SURGERY     partial removal [facility] in 2003   PORT-A-CATH REMOVAL Right 07/16/2012   Procedure: REMOVAL PORT-A-CATH;  Surgeon: Haywood Lasso, MD;  Location: [facility];  Service: General;  Laterality: Right;     Social History:   reports that he quit smoking about 5 years ago. His smoking use included cigarettes. He has a 16.00 pack-year smoking history. He has never used smokeless tobacco. He reports that he does not drink alcohol and does not use drugs.   Family History:  His family history is not on file.   Allergies  No Known Allergies   Home Medications  Prior to Admission medications   Medication Sig Start Date End Date Taking? Authorizing Provider  amLODipine (NORVASC) 5 MG tablet Take 1 tablet (5 mg total) by mouth daily. 07/30/13  Yes Tresa Garter, MD  cyclobenzaprine (FLEXERIL) 10 MG tablet Take 10 mg by mouth 3 (three) times daily as needed for muscle spasms. 08/12/13  Yes [provider]  gabapentin (NEURONTIN) 300 MG capsule Take 600-900 mg by mouth See admin instructions. Take 600 mg by mouth in the morning and afternoon, then 900mg  at bedtime 10/23/17  Yes [provider]  hydrochlorothiazide (HYDRODIURIL) 25 MG tablet Take 25 mg by mouth daily. 09/05/17  Yes [provider]  lisinopril (ZESTRIL) 5 MG tablet Take 5 mg by mouth daily. 01/24/21  Yes [provider]  metFORMIN (GLUCOPHAGE-XR) 500 MG 24 hr tablet Take 1,000 mg by mouth 2 (two) times daily. 10/16/17  Yes [provider]  oxyCODONE-acetaminophen (PERCOCET/ROXICET) 5-325 MG tablet Take 1 tablet by mouth every 6 (six) hours as needed for severe pain.  11/28/17  Yes Clovis Riley, MD  OZEMPIC, 1 MG/DOSE, 4 MG/3ML SOPN Inject 1 mg into the skin every Monday. 02/04/21  Yes [provider]  rosuvastatin (CRESTOR) 20 MG tablet Take 20 mg by mouth daily. 03/10/21  Yes [provider]  sildenafil (REVATIO) 20 MG tablet Take 20 mg by mouth daily as needed (ED).   Yes [provider]  TRESIBA FLEXTOUCH 200 UNIT/ML FlexTouch Pen Inject 30 Units into the skin every evening. 03/22/21  Yes [provider]  XARELTO 20 MG TABS tablet Take 20 mg by mouth at bedtime. 03/05/21  Yes [provider]  doxycycline (VIBRA-TABS) 100 MG tablet Take 100 mg by mouth See admin instructions. Bid x 10 days Patient not taking: Reported on 03/29/2021    [provider]  furosemide (LASIX) 20 MG tablet Take 20 mg by mouth See admin instructions. Qd x 3 days Patient not taking: Reported on 03/29/2021 09/02/20   [provider]     Critical care time: 35 minutes     CRITICAL CARE Performed by: Otilio Carpen Coden Franchi   Total critical care time: 35 minutes  Critical care time was exclusive of separately billable procedures and treating other patients.  Critical care was necessary to treat or prevent imminent or life-threatening deterioration.  Critical care was time spent personally by me on the following activities: development of treatment plan with patient and/or surrogate as well as nursing, discussions with consultants, evaluation of patient's response to treatment, examination of patient, obtaining history from patient or surrogate, ordering and performing treatments and interventions, ordering and review of laboratory studies, ordering and review of radiographic studies, pulse oximetry and re-evaluation of patient's condition.  Otilio Carpen Suzana Sohail, PA-C [facility] See Amion for pager If no response to pager , please call [phone] until 7pm After 7:00 pm call Elink  [phone]

## 2021-03-29 NOTE — Consult Note (Addendum)
The patient has been seen in conjunction with Vikki Ports PAC. All aspects of care have been considered and discussed. The patient has been personally interviewed, examined, and all clinical data has been reviewed.  New acute on chronic systolic heart failure (LV is markedly dilated) in this patient with known primary hypertension, prior DVT, status post: Cancer resection and partial hepatectomy in 2001.  Etiology is uncertain. Acute hypoxic respiratory failure is presumed secondary to systolic heart failure with pulmonary edema. Acute on chronic kidney disease stage III.  Follow closely with aggressive diuresis being implemented. Recommendation: Aggressive diuresis; may need inotropic support if Co-Ox is low; as blood pressure and volume status tolerate, will be able to add guideline directed therapy for systolic heart failure.  Overall prognosis is guarded.  Once stable/euvolemic, will likely need an ischemic evaluation.  We should cycle cardiac markers.  Replete potassium.  Addendum: Co-Ox is 77%. Suggests good cardiac output. No need fr inotropes. IV diuresis and wean NE as tolerated by BP.    Cardiology Consultation:   Patient ID: Raymond Allen MRN: [id number]; DOB: October 03, 1960  Admit date: 03/29/2021 Date of Consult: 03/29/2021  PCP:  [place], Julie M, NP   [facility] Providers Cardiologist:  None        Patient Profile:   Raymond Allen is a 60 y.o. male with a hx of hypertension, DVT on Xarelto, type 2 DM, colon cancer s/p partical colectomy 2001 and mets to the liver s/p L hepatectomy who is being seen 03/29/2021 for the evaluation of CHF at the request of Dr. Tamala Julian with critical care.   History of Present Illness:   Raymond Allen has recently been treated outpatient for cellulitis of left lower extremity, venous insufficiency of both lower extremities, venous ulcers of both lower extremities, and uncontrolled diabetes. 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   Patient presented to the ED early on 12/13 complaining of shortness of breath that began 3-4 weeks ago and had progressed to a point where he felt like he could not breathe. Patient reported having intermittent chest tightness, significant lower extremity swelling, and a cough that is productive of white sputum. EKG in the ED showed sinus tachycardia and ST & T wave abnormality that was suspicious for inferolateral ischemia. HSTN 44>>39. BNP elevated to 391.6. Labs showed Na 136, K 4.1, creatinine 1.59, hemoglobin 12.8. Viral panel negative. Chest x-ray showed moderate pulmonary edema suggestive of moderate congestive heart failure.   Patient's O2 saturation was 88% upon arrival to the ED. Patient was placed on bipap and given Lasix 40mg . Patient was unable to tolerate bipap and was intubated. Stat echocardiogram showed a LVEF of 25-30%. Left ventricle demonstrates global hypokinesis and is severely dilated. Right ventricular systolic function is mildly reduced. Left atrium is moderately dilated and there is moderate to severe mitral valve regurgitation.   Past Medical History:  Diagnosis Date   Cancer [facility])    Colon cancer metastasized to liver ([place]) 01/05/2012   Colon carcinoma ([place])    colon ca dx 2001;   Diabetes mellitus without complication ([place])    Hypertension    Muscle weakness-general 01/05/2012    Past Surgical History:  Procedure Laterality Date   CHOLECYSTECTOMY     COLON SURGERY     sigmoid colectomy, appendectomy   INCISIONAL HERNIA REPAIR N/A 11/27/2017   Procedure: LAPAROSCOPIC ASSISTED INCISIONAL HERNIA;  Surgeon: Clovis Riley, MD;  Location: [facility];  Service: General;  Laterality: N/A;   INSERTION OF MESH N/A 11/27/2017   Procedure: INSERTION OF MESH;  Surgeon: Clovis Riley, MD;  Location: [facility];  Service: General;  Laterality: N/A;   LAPAROSCOPIC LYSIS OF ADHESIONS N/A 11/27/2017   Procedure:  LAPAROSCOPIC LYSIS OF ADHESIONS;  Surgeon: Clovis Riley, MD;  Location: [facility] ORS;  Service: General;  Laterality: N/A;   LIVER SURGERY     partial removal [facility] in 2003   PORT-A-CATH REMOVAL Right 07/16/2012   Procedure: REMOVAL PORT-A-CATH;  Surgeon: Haywood Lasso, MD;  Location: [facility];  Service: General;  Laterality: Right;     Home Medications:  Prior to Admission medications   Medication Sig Start Date End Date Taking? Authorizing Provider  amLODipine (NORVASC) 5 MG tablet Take 1 tablet (5 mg total) by mouth daily. 07/30/13  Yes Tresa Garter, MD  cyclobenzaprine (FLEXERIL) 10 MG tablet Take 10 mg by mouth 3 (three) times daily as needed for muscle spasms. 08/12/13  Yes [provider]  gabapentin (NEURONTIN) 300 MG capsule Take 600-900 mg by mouth See admin instructions. Take 600 mg by mouth in the morning and afternoon, then 900mg  at bedtime 10/23/17  Yes [provider]  hydrochlorothiazide (HYDRODIURIL) 25 MG tablet Take 25 mg by mouth daily. 09/05/17  Yes [provider]  lisinopril (ZESTRIL) 5 MG tablet Take 5 mg by mouth daily. 01/24/21  Yes [provider]  metFORMIN (GLUCOPHAGE-XR) 500 MG 24 hr tablet Take 1,000 mg by mouth 2 (two) times daily. 10/16/17  Yes [provider]  oxyCODONE-acetaminophen (PERCOCET/ROXICET) 5-325 MG tablet Take 1 tablet by mouth every 6 (six) hours as needed for severe pain. 11/28/17  Yes Clovis Riley, MD  OZEMPIC, 1 MG/DOSE, 4 MG/3ML SOPN Inject 1 mg into the skin every Monday. 02/04/21  Yes [provider]  rosuvastatin (CRESTOR) 20 MG tablet Take 20 mg by mouth daily. 03/10/21  Yes [provider]  sildenafil (REVATIO) 20 MG tablet Take 20 mg by mouth daily as needed (ED).   Yes [provider]  TRESIBA FLEXTOUCH 200 UNIT/ML FlexTouch Pen Inject 30 Units into the skin every evening. 03/22/21  Yes [provider]  XARELTO 20 MG TABS tablet Take 20  mg by mouth at bedtime. 03/05/21  Yes [provider]  doxycycline (VIBRA-TABS) 100 MG tablet Take 100 mg by mouth See admin instructions. Bid x 10 days Patient not taking: Reported on 03/29/2021    [provider]  furosemide (LASIX) 20 MG tablet Take 20 mg by mouth See admin instructions. Qd x 3 days Patient not taking: Reported on 03/29/2021 09/02/20   [provider]    Inpatient Medications: Scheduled Meds:  fentaNYL (SUBLIMAZE) injection  50 mcg Intravenous Once   furosemide  80 mg Intravenous BID   [START ON 03/30/2021] gabapentin  600 mg Per Tube BID WC   gabapentin  900 mg Per Tube QHS   insulin aspart  0-20 Units Subcutaneous Q4H   rivaroxaban  20 mg Per Tube Q supper   Continuous Infusions:  fentaNYL infusion INTRAVENOUS 50 mcg/hr (03/29/21 1031)   nitroGLYCERIN Stopped (03/29/21 0905)   norepinephrine (LEVOPHED) Adult infusion 10 mcg/min (03/29/21 1246)   propofol (DIPRIVAN) infusion 30 mcg/kg/min (03/29/21 1358)   PRN Meds: cyclobenzaprine, docusate sodium, fentaNYL, polyethylene glycol  Allergies:   No Known Allergies  Social History:   Social History   Socioeconomic History   Marital status: Single    Spouse  name: Not on file   Number of children: Not on file   Years of education: Not on file   Highest education level: Not on file  Occupational History   Not on file  Tobacco Use   Smoking status: Former    Packs/day: 0.50    Years: 32.00    Pack years: 16.00    Types: Cigarettes    Quit date: 05/19/2015    Years since quitting: 5.8   Smokeless tobacco: Never  Vaping Use   Vaping Use: Never used  Substance and Sexual Activity   Alcohol use: No   Drug use: No   Sexual activity: Not on file  Other Topics Concern   Not on file  Social History Narrative   Not on file   Social Determinants of Health   Financial Resource Strain: Not on file  Food Insecurity: Not on file  Transportation Needs: Not on file  Physical  Activity: Not on file  Stress: Not on file  Social Connections: Not on file  Intimate Partner Violence: Not on file    Family History:   No family history on file.   ROS:  Please see the history of present illness.   All other ROS reviewed and negative.     Physical Exam/Data:   Vitals:   03/29/21 1315 03/29/21 1330 03/29/21 1345 03/29/21 1400  BP: 108/85 115/89 102/72 115/86  Pulse: (!) 111 (!) 113 (!) 112 (!) 114  Resp: (!) 24 (!) 24 (!) 24 (!) 24  Temp:      SpO2: 100% 100% 100% 99%  Weight:      Height:        Intake/Output Summary (Last 24 hours) at 03/29/2021 1446 Last data filed at 03/29/2021 1114 Gross per 24 hour  Intake 0.77 ml  Output 2200 ml  Net -2199.23 ml   Last 3 Weights 03/29/2021 11/22/2017 07/28/2014  Weight (lbs) 288 lb 290 lb 230 lb  Weight (kg) 130.636 kg 131.543 kg 104.327 kg     Body mass index is 38 kg/m.  General:  Well nourished, well developed, intubated and sedated HEENT: normal Neck: JVD present  Vascular: Distal pulses 2+ bilaterally Cardiac:  normal S1, S2; tachycardic, no murmur  Lungs:  rales in all lung fields  Abd: soft, nontender Ext: 3+ pitting edema in bilateral lower extremities  Musculoskeletal:  No deformities Skin: warm and dry  Psych: Sedated   EKG:  The EKG was personally reviewed and demonstrates:  sinus tachycardia, T-wave inversions in leads I, V5 and V6, low voltage QRS Telemetry:  Telemetry was personally reviewed and demonstrates:  sinus tachycardia, occasional PVCs   Relevant CV Studies: NA   Laboratory Data:  High Sensitivity Troponin:   Recent Labs  Lab 03/29/21 0501 03/29/21 0628  TROPONINIHS 44* 39*     Chemistry Recent Labs  Lab 03/29/21 0501 03/29/21 1219  NA 136 141  K 4.1 3.1*  CL 101  --   CO2 26  --   GLUCOSE 154*  --   BUN 20  --   CREATININE 1.59*  --   CALCIUM 9.0  --   GFRNONAA 49*  --   ANIONGAP 9  --     No results for input(s): PROT, ALBUMIN, AST, ALT, ALKPHOS, BILITOT  in the last 168 hours. Lipids No results for input(s): CHOL, TRIG, HDL, LABVLDL, LDLCALC, CHOLHDL in the last 168 hours.  Hematology Recent Labs  Lab 03/29/21 0501 03/29/21 1219  WBC 8.5  --  RBC 5.14  --   HGB 12.8* 13.6  HCT 43.0 40.0  MCV 83.7  --   MCH 24.9*  --   MCHC 29.8*  --   RDW 15.7*  --   PLT 260  --    Thyroid No results for input(s): TSH, FREET4 in the last 168 hours.  BNP Recent Labs  Lab 03/29/21 0502  BNP 391.6*    DDimer No results for input(s): DDIMER in the last 168 hours.   Radiology/Studies:  DG Abdomen 1 View  Result Date: 03/29/2021 CLINICAL DATA:  Placement of enteric tube EXAM: ABDOMEN - 1 VIEW COMPARISON:  Place the tip and side port within the stomach. FINDINGS: There is interval placement of enteric tube with its tip close to the gastroesophageal junction. Side port is in the lower thoracic esophagus. Enteric tube should be advanced 10 cm. Upper lung fields are not included in the radiograph. Transverse diameter of heart is increased. Central pulmonary vessels are prominent. Breathing motion limits evaluation of the lung fields. IMPRESSION: Tip of enteric tube is at the gastroesophageal junction. Side port in the enteric tube is in the lower thoracic esophagus. Enteric tube should be advanced 10 cm. These results will be called to the ordering clinician or representative by the Radiologist Assistant, and communication documented in the PACS or [employer]. Electronically Signed   By: Elmer Picker M.D.   On: 03/29/2021 11:57   DG Chest Portable 1 View  Result Date: 03/29/2021 CLINICAL DATA:  Endotracheal tube and central line placement. EXAM: PORTABLE CHEST 1 VIEW COMPARISON:  Chest radiograph performed earlier on the same date FINDINGS: The heart is enlarged. Prominence of the pulmonary vessels. Bilateral lower lobe hazy opacities concerning for pulmonary edema and/or small effusions. Endotracheal tube with distal tip at the level of the  clavicular heads. Feeding tube coursing below the diaphragm with side port likely in the distal esophagus. Left IJ access central line with distal tip in the SVC. IMPRESSION: 1. Stable cardiomegaly with pulmonary edema and/or small effusions, suggesting CHF. 2. Endotracheal tube with distal tip approximately 7 cm above the carina. 3. Feeding tube coursing below the diaphragm with side port likely in the distal esophagus. It could be advanced 5-7 cm. 4.  Left IJ access central line with distal tip in the SVC. Electronically Signed   By: Keane Police D.O.   On: 03/29/2021 11:12   DG Chest Portable 1 View  Result Date: 03/29/2021 CLINICAL DATA:  60 year old male with history of shortness of breath, hypoxia and lower extremity edema. EXAM: PORTABLE CHEST 1 VIEW COMPARISON:  Chest x-ray 01/22/2018. FINDINGS: There is cephalization of the pulmonary vasculature, indistinctness of the interstitial markings, and patchy airspace disease throughout the lungs bilaterally suggestive of moderate pulmonary edema. Small bilateral pleural effusions. Moderate cardiomegaly. Upper mediastinal contours are within normal limits. IMPRESSION: 1. The appearance the chest suggests moderate congestive heart failure, as above. Electronically Signed   By: Vinnie Langton M.D.   On: 03/29/2021 05:25   DG Abd Portable 1V  Result Date: 03/29/2021 CLINICAL DATA:  Orogastric tube placement. EXAM: PORTABLE ABDOMEN - 1 VIEW COMPARISON:  Radiographs 03/29/2021.  CT 12/17/2019. FINDINGS: 1410 hours. Single-view centered on the upper abdomen. Enteric tube position is similar to prior radiograph, with tip near the gastroesophageal junction and side hole overlying the distal esophagus. The visualized bowel gas pattern is nonobstructive. Multiple surgical clips are present within the upper abdomen. There are patchy left greater than right basilar airspace opacities and a  probable small left pleural effusion. Multiple telemetry leads overlie the  chest and upper abdomen. IMPRESSION: Unchanged position of the enteric tube from earlier radiograph, tip near the expected gastroesophageal junction. Electronically Signed   By: Richardean Sale M.D.   On: 03/29/2021 14:21   ECHOCARDIOGRAM COMPLETE  Result Date: 03/29/2021    ECHOCARDIOGRAM REPORT   Patient Name:   Raymond Allen Date of Exam: 03/29/2021 Medical Rec #:  [id number]        Height:       73.0 in Accession #:    [id number]       Weight:       288.0 lb Date of Birth:  08/17/1960        BSA:          2.512 m Patient Age:    83 years         BP:           134/93 mmHg Patient Gender: M                HR:           119 bpm. Exam Location:  Inpatient Procedure: 2D Echo, Color Doppler, Cardiac Doppler and Intracardiac            Opacification Agent STAT ECHO Indications:    Pulmonary edema  History:        Patient has no prior history of Echocardiogram examinations.                 Risk Factors:Hypertension and Diabetes.  Sonographer:    Jyl Heinz Referring Phys: [id number] [place]  1. No left ventricular thrombus (with Definity). Left ventricular ejection fraction, by estimation, is 25 to 30%. The left ventricle has severely decreased function. The left ventricle demonstrates global hypokinesis. The left ventricular internal cavity size was severely dilated. There is mild eccentric left ventricular hypertrophy. Indeterminate diastolic filling due to E-A fusion.  2. Right ventricular systolic function is mildly reduced. The right ventricular size is mildly enlarged. There is mildly elevated pulmonary artery systolic pressure. The estimated right ventricular systolic pressure is 51.0 mmHg.  3. Left atrial size was moderately dilated.  4. The mitral valve is abnormal. Moderate to severe mitral valve regurgitation.  5. The aortic valve is tricuspid. Aortic valve regurgitation is mild. Aortic valve sclerosis/calcification is present, without any evidence of aortic stenosis.  6. The  inferior vena cava is normal in size with <50% respiratory variability, suggesting right atrial pressure of 8 mmHg. FINDINGS  Left Ventricle: No left ventricular thrombus (with Definity). Left ventricular ejection fraction, by estimation, is 25 to 30%. The left ventricle has severely decreased function. The left ventricle demonstrates global hypokinesis. The left ventricular internal cavity size was severely dilated. There is mild eccentric left ventricular hypertrophy. Indeterminate diastolic filling due to E-A fusion. Right Ventricle: The right ventricular size is mildly enlarged. No increase in right ventricular wall thickness. Right ventricular systolic function is mildly reduced. There is mildly elevated pulmonary artery systolic pressure. The tricuspid regurgitant  velocity is 2.74 m/s, and with an assumed right atrial pressure of 8 mmHg, the estimated right ventricular systolic pressure is 25.8 mmHg. Left Atrium: Left atrial size was moderately dilated. Right Atrium: Right atrial size was normal in size. Pericardium: There is no evidence of pericardial effusion. Mitral Valve: The mitral valve is abnormal. Moderate to severe mitral valve regurgitation, with centrally-directed jet. Tricuspid Valve: The tricuspid valve is normal in structure. Tricuspid valve  regurgitation is mild. Aortic Valve: The aortic valve is tricuspid. Aortic valve regurgitation is mild. Aortic regurgitation PHT measures 496 msec. Aortic valve sclerosis/calcification is present, without any evidence of aortic stenosis. Aortic valve peak gradient measures 3.4  mmHg. Pulmonic Valve: The pulmonic valve was grossly normal. Pulmonic valve regurgitation is not visualized. Aorta: The aortic root and ascending aorta are structurally normal, with no evidence of dilitation. Venous: The inferior vena cava is normal in size with less than 50% respiratory variability, suggesting right atrial pressure of 8 mmHg. IAS/Shunts: No atrial level shunt  detected by color flow Doppler. Additional Comments: A device lead is visualized in the right ventricle.  LEFT VENTRICLE PLAX 2D LVIDd:         7.20 cm      Diastology LVIDs:         6.00 cm      LV e' medial:    3.81 cm/s LV PW:         1.10 cm      LV E/e' medial:  27.6 LV IVS:        1.30 cm      LV e' lateral:   8.70 cm/s LVOT diam:     2.40 cm      LV E/e' lateral: 12.1 LV SV:         35 LV SV Index:   14 LVOT Area:     4.52 cm  LV Volumes (MOD) LV vol d, MOD A2C: 296.0 ml LV vol d, MOD A4C: 258.0 ml LV vol s, MOD A2C: 205.0 ml LV vol s, MOD A4C: 177.0 ml LV SV MOD A2C:     91.0 ml LV SV MOD A4C:     258.0 ml LV SV MOD BP:      87.0 ml RIGHT VENTRICLE            IVC RV Basal diam:  4.10 cm    IVC diam: 1.10 cm RV Mid diam:    3.80 cm RV S prime:     8.92 cm/s TAPSE (M-mode): 1.8 cm LEFT ATRIUM              Index        RIGHT ATRIUM           Index LA diam:        4.20 cm  1.67 cm/m   RA Area:     16.40 cm LA Vol (A2C):   101.0 ml 40.20 ml/m  RA Volume:   40.30 ml  16.04 ml/m LA Vol (A4C):   96.2 ml  38.29 ml/m LA Biplane Vol: 102.0 ml 40.60 ml/m  AORTIC VALVE AV Area (Vmax): 2.92 cm AV Vmax:        91.70 cm/s AV Peak Grad:   3.4 mmHg LVOT Vmax:      59.10 cm/s LVOT Vmean:     44.600 cm/s LVOT VTI:       0.076 m AI PHT:         496 msec  AORTA Ao Root diam: 3.60 cm Ao Asc diam:  3.40 cm MITRAL VALVE                TRICUSPID VALVE MV Area (PHT): 6.12 cm     TR Peak grad:   30.0 mmHg MV Decel Time: 124 msec     TR Vmax:        274.00 cm/s MR Peak grad: 67.2 mmHg MR Mean grad: 47.5 mmHg  SHUNTS MR Vmax:      410.00 cm/s   Systemic VTI:  0.08 m MR Vmean:     334.0 cm/s    Systemic Diam: 2.40 cm MV E velocity: 105.00 cm/s Mihai Croitoru MD Electronically signed by Sanda Klein MD Signature Date/Time: 03/29/2021/10:50:12 AM    Final      Assessment and Plan:   Acute hypoxic respiratory failure secondary to new onset systolic CHF: echo in the ED showed LVEF of 25-30%, left ventricular hypokinesis and  severe dilation, moderate left atrial dilation, and moderate to severe mitral valve regurgitation. Chest X-ray showed moderate pulmonary edema suggestive of moderate congestive heart failure. Patient complained of severe swelling in the lower extremities on presentation. Net -2.199 L.  Patient required intubation. Management per PCCM - Coox pending - Continue to diurese with IV Lasix - GDMT limited due to low blood pressure. Consider beta blocker, ACEi/ARB, aldosterone antagonist when BP can tolerate  - Left and right heart cath when stable   HTN:  On outpatient regiment of amlodipine, hydrochlorothiazide, lisinopril, lasix - Currently on levophed  - Blood pressure cannot tolerate therapy  - Consider starting beta blocker, ACEi/ARB, aldosterone antagonist as BP allows   HLD: Lipid panel from 07/20/20 showed LDL 91, HDL 55, triglycerides 128, and total cholesterol 164. Patient on crestor 20 mg outpatient.  - Increase crestor to 40 mg daily   - Will resume statin therapy once taking PO medications   Hx of Lower extremity DVT - Continue Xarelto   Type 2 DM: A1c 7.7 - Continue outpatient metformin  - Consider adding jardiance   History of Colon Cancer with mets to liver  S/p partial colectomy and partial hepatectomy in 2001    Risk Assessment/Risk Scores:        New York Heart Association (NYHA) Functional Class NYHA Class IV        For questions or updates, please contact [facility] Please consult [email or web address] for contact info under    Signed, Margie Billet, PA-C  03/29/2021 2:46 PM

## 2021-03-29 NOTE — ED Triage Notes (Signed)
Pt presents to the ED from home with complaints of SOB onset 3 weeks ago. Increased swelling in legs. CP tonight better with cold air, worse with exertion. Does not wear O2 at home, 88% on RA on arrival

## 2021-03-29 NOTE — ED Provider Notes (Signed)
[facility] EMERGENCY DEPARTMENT Provider Note   CSN: [id number] Arrival date & time: 03/29/21  0347     History Chief Complaint  Patient presents with   Shortness of Breath    Raymond Allen is a 60 y.o. male.  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  Patient reports chronic shortness of breath for the past year.  He does not have a home oxygen requirement.  States that earlier tonight it acutely worsened.  Currently on 4 L via nasal cannula saturating in the low 90s.  Denies any URI symptoms or chest pain.  No vomiting.  Patient does note that he recently had a URI a few weeks ago and has had frank hemoptysis when coughing which he feels has mostly improved.    Past Medical History:  Diagnosis Date   Cancer [facility])    Colon cancer metastasized to liver ([place]) 01/05/2012   Colon carcinoma ([place])    colon ca dx 2001;   Diabetes mellitus without complication ([place])    Hypertension    Muscle weakness-general 01/05/2012    Patient Active Problem List   Diagnosis Date Noted   Incisional hernia 11/27/2017   Colon cancer metastasized to liver ([place]) 01/05/2012   FLANK PAIN, RIGHT 01/21/2008   TOBACCO ABUSE 12/26/2007   HYPERGLYCEMIA 12/26/2007   PERS HX NONCOMPLIANCE W/MED TX PRS HAZARDS HLTH 12/26/2007   CARCINOMA, COLON 12/17/2007   HYPERTENSION 12/17/2007    Past Surgical History:  Procedure Laterality Date   CHOLECYSTECTOMY     COLON SURGERY     sigmoid colectomy, appendectomy   INCISIONAL HERNIA REPAIR N/A 11/27/2017   Procedure: LAPAROSCOPIC ASSISTED INCISIONAL HERNIA;  Surgeon: Clovis Riley, MD;  Location: [facility];  Service: General;  Laterality:  N/A;   INSERTION OF MESH N/A 11/27/2017   Procedure: INSERTION OF MESH;  Surgeon: Clovis Riley, MD;  Location: [facility];  Service: General;  Laterality: N/A;   LAPAROSCOPIC LYSIS OF ADHESIONS N/A 11/27/2017   Procedure: LAPAROSCOPIC LYSIS OF ADHESIONS;  Surgeon: Clovis Riley, MD;  Location: [facility];  Service: General;  Laterality: N/A;   LIVER SURGERY     partial removal [facility] in 2003   PORT-A-CATH REMOVAL Right 07/16/2012   Procedure: REMOVAL PORT-A-CATH;  Surgeon: Haywood Lasso, MD;  Location: [facility];  Service: General;  Laterality: Right;       No family history on file.  Social History   Tobacco Use   Smoking status: Former    Packs/day: 0.50    Years: 32.00    Pack years: 16.00    Types: Cigarettes    Quit date: 05/19/2015    Years since quitting: 5.8   Smokeless tobacco: Never  Vaping Use   Vaping Use: Never used  Substance Use Topics   Alcohol use: No   Drug use: No    Home Medications Prior to Admission medications   Medication Sig Start Date End Date Taking? Authorizing Provider  amLODipine (NORVASC) 5 MG tablet Take 1 tablet (5 mg total) by mouth daily. 07/30/13  Yes Tresa Garter, MD  cyclobenzaprine (FLEXERIL) 10 MG tablet Take 10 mg by mouth 3 (three) times daily as needed for muscle spasms. 08/12/13  Yes [provider]  gabapentin (NEURONTIN) 300 MG capsule Take 600-900 mg by mouth See admin instructions. Take 600 mg by mouth in the morning and afternoon, then 900mg  at bedtime 10/23/17  Yes [provider]  hydrochlorothiazide (HYDRODIURIL) 25 MG tablet Take 25 mg by mouth daily. 09/05/17  Yes [provider]  lisinopril (ZESTRIL) 5 MG tablet Take 5 mg by mouth daily. 01/24/21  Yes [provider]  metFORMIN (GLUCOPHAGE-XR) 500 MG 24 hr tablet Take 1,000 mg by mouth 2 (two) times daily. 10/16/17  Yes [provider]  oxyCODONE-acetaminophen (PERCOCET/ROXICET) 5-325 MG tablet Take 1 tablet by  mouth every 6 (six) hours as needed for severe pain. 11/28/17  Yes Clovis Riley, MD  OZEMPIC, 1 MG/DOSE, 4 MG/3ML SOPN Inject 1 mg into the skin every Monday. 02/04/21  Yes [provider]  rosuvastatin (CRESTOR) 20 MG tablet Take 20 mg by mouth daily. 03/10/21  Yes [provider]  sildenafil (REVATIO) 20 MG tablet Take 20 mg by mouth daily as needed (ED).   Yes [provider]  TRESIBA FLEXTOUCH 200 UNIT/ML FlexTouch Pen Inject 30 Units into the skin every evening. 03/22/21  Yes [provider]  XARELTO 20 MG TABS tablet Take 20 mg by mouth at bedtime. 03/05/21  Yes [provider]  doxycycline (VIBRA-TABS) 100 MG tablet Take 100 mg by mouth See admin instructions. Bid x 10 days Patient not taking: Reported on 03/29/2021    [provider]  furosemide (LASIX) 20 MG tablet Take 20 mg by mouth See admin instructions. Qd x 3 days Patient not taking: Reported on 03/29/2021 09/02/20   [provider]    Allergies    Patient has no known allergies.  Review of Systems   Review of Systems  All other systems reviewed and are negative. Ten systems reviewed and are negative for acute change, except as noted in the HPI.   Physical Exam Updated Vital Signs BP 131/89   Pulse (!) 110   Temp 98 F (36.7 C)   Resp (!) 26   Ht 6\' 1"  (1.854 m)   Wt 130.6 kg   SpO2 94%   BMI 38.00 kg/m   Physical Exam Vitals and nursing note reviewed.  Constitutional:      General: He is not in acute distress.    Appearance: Normal appearance. He is not ill-appearing, toxic-appearing or diaphoretic.  HENT:     Head: Normocephalic and atraumatic.     Right Ear: External ear normal.     Left Ear: External ear normal.     Nose: Nose normal.     Mouth/Throat:     Mouth: Mucous membranes are moist.     Pharynx: Oropharynx is clear. No oropharyngeal exudate or posterior oropharyngeal erythema.  Eyes:     Extraocular Movements: Extraocular  movements intact.  Cardiovascular:     Rate and Rhythm: Regular rhythm. Tachycardia present.     Pulses: Normal pulses.     Heart sounds: Normal heart sounds. No murmur heard.   No friction rub. No gallop.  Pulmonary:     Effort: Pulmonary effort is normal. Tachypnea present. No respiratory distress.     Breath sounds: No stridor. Examination of the right-middle field reveals rhonchi. Examination of the left-middle field reveals rhonchi. Examination of the right-lower field reveals rhonchi. Examination of the left-lower field reveals rhonchi. Rhonchi present. No decreased breath sounds, wheezing or rales.     Comments: Rales noted in the bilateral lung bases.  Oxygen saturations  in the low 90s on 4 L via nasal cannula. Abdominal:     General: Abdomen is flat.     Tenderness: There is no abdominal tenderness.  Musculoskeletal:        General: Normal range of motion.     Cervical back: Normal range of motion and neck supple. No tenderness.     Right lower leg: Edema present.     Left lower leg: Edema present.     Comments: 2+ pitting edema noted in the bilateral lower extremities.  Skin:    General: Skin is warm and dry.  Neurological:     General: No focal deficit present.     Mental Status: He is alert and oriented to person, place, and time.  Psychiatric:        Mood and Affect: Mood normal.        Behavior: Behavior normal.   ED Results / Procedures / Treatments   Labs (all labs ordered are listed, but only abnormal results are displayed) Labs Reviewed  CBC WITH DIFFERENTIAL/PLATELET - Abnormal; Notable for the following components:      Result Value   Hemoglobin 12.8 (*)    MCH 24.9 (*)    MCHC 29.8 (*)    RDW 15.7 (*)    All other components within normal limits  BASIC METABOLIC PANEL - Abnormal; Notable for the following components:   Glucose, Bld 154 (*)    Creatinine, Ser 1.59 (*)    GFR, Estimated 49 (*)    All other components within normal limits  BRAIN  NATRIURETIC PEPTIDE - Abnormal; Notable for the following components:   B Natriuretic Peptide 391.6 (*)    All other components within normal limits  TROPONIN I (HIGH SENSITIVITY) - Abnormal; Notable for the following components:   Troponin I (High Sensitivity) 44 (*)    All other components within normal limits  RESP PANEL BY RT-PCR (FLU A&B, COVID) [id number]  TROPONIN I (HIGH SENSITIVITY)   EKG EKG Interpretation  Date/Time:  Tuesday March 29 2021 04:47:31 EST Ventricular Rate:  117 PR Interval:  156 QRS Duration: 86 QT Interval:  338 QTC Calculation: 471 R Axis:   54 Text Interpretation: Sinus tachycardia Low voltage QRS Cannot rule out Anterior infarct , age undetermined ST & T wave abnormality, consider inferolateral ischemia Abnormal ECG When compared with ECG of 11/22/2017, No significant change was found Confirmed by Delora Fuel ([id number]) on 03/29/2021 4:52:53 AM  Radiology DG Chest Portable 1 View  Result Date: 03/29/2021 CLINICAL DATA:  60 year old male with history of shortness of breath, hypoxia and lower extremity edema. EXAM: PORTABLE CHEST 1 VIEW COMPARISON:  Chest x-ray 01/22/2018. FINDINGS: There is cephalization of the pulmonary vasculature, indistinctness of the interstitial markings, and patchy airspace disease throughout the lungs bilaterally suggestive of moderate pulmonary edema. Small bilateral pleural effusions. Moderate cardiomegaly. Upper mediastinal contours are within normal limits. IMPRESSION: 1. The appearance the chest suggests moderate congestive heart failure, as above. Electronically Signed   By: Vinnie Langton M.D.   On: 03/29/2021 05:25    Procedures .Critical Care Performed by: Rayna Sexton, PA-C Authorized by: Rayna Sexton, PA-C   Critical care provider statement:    Critical care time (minutes):  45   Critical care was necessary to treat or prevent imminent or life-threatening deterioration of the following conditions:  Respiratory  failure   Critical care was time spent personally by me on the following activities:  Development of treatment plan with patient or surrogate, discussions with  consultants, evaluation of patient's response to treatment, examination of patient, ordering and review of laboratory studies, ordering and review of radiographic studies, ordering and performing treatments and interventions, pulse oximetry, re-evaluation of patient's condition and review of old charts   Medications Ordered in ED Medications  furosemide (LASIX) injection 40 mg (40 mg Intravenous Given 03/29/21 1655)   ED Course  I have reviewed the triage vital signs and the nursing notes.  Pertinent labs & imaging results that were available during my care of the patient were reviewed by me and considered in my medical decision making (see chart for details).  Clinical Course as of 03/29/21 0655  Tue Mar 29, 2021  0607 Patient continuing to desaturate in the mid to high 49s.  Chest x-ray and BNP consistent with new onset CHF.  We will start patient on BiPAP.  Will admit. [LJ]    Clinical Course User Index [LJ] Rayna Sexton, PA-C   MDM Rules/Calculators/A&P                          Pt is a 60 y.o. male who presents to the emergency department with 1 year of shortness of breath that began worsening tonight.  Reports associated orthopnea and leg swelling.  Labs: CBC with a hemoglobin of 12.8, MCH 24.9, MCHC of 29.8, RDW 15.7. BMP with a glucose of 154, creatinine of 1.59, GFR of 49. Troponin of 44. BNP of 391.6. Respiratory panel is pending.  Imaging: Chest x-ray shows an appearance consistent with moderate CHF.  I, Rayna Sexton, PA-C, personally reviewed and evaluated these images and lab results as part of my medical decision-making.  Patient with what appears to be new onset CHF.  2+ edema in the lower extremities.  Rales noted in the bilateral lung bases.  Patient initially started on oxygen via nasal cannula but  was persistently hypoxic so was transitioned to BiPAP.    Will start patient on Lasix.  Potassium within normal limits at 4.1.  Given patient's BP, will not initiate nitrates at this time.  Patient will require admission for further management.  Will discuss with the medicine team.  Medicine requested that we speak to the intensivist.  Patient discussed with the intensivist who will evaluate the patient. Patient going to re attempt BiPAP. Will consider high flow nasal cannula if patient unable to tolerate.  If patient continues to desaturate he will possibly require intubation.  Note: Portions of this report may have been transcribed using voice recognition software. Every effort was made to ensure accuracy; however, inadvertent computerized transcription errors may be present.   Final Clinical Impression(s) / ED Diagnoses Final diagnoses:  Shortness of breath  Acute on chronic congestive heart failure, unspecified heart failure type [facility])   Rx / DC Orders ED Discharge Orders     None        Rayna Sexton, PA-C 37/48/27 0786    Delora Fuel, MD 75/44/92 [phone]

## 2021-03-29 NOTE — ED Notes (Signed)
Pt reports needing to have a bowel movement. I offered a bedpan, which patient aggressively declined. I told him he will not be walking to the bathroom disconnected from bipap and all monitoring equipment and he informed me he will do "whatever the hell he wants to do." I immediately left room to get bedside commode. Pt standing at end of bed on my return to room, partially disconnected from monitoring equipment, again being verbally aggressive with me. Pt using bedside commode at this time, remains connected to bipap.

## 2021-03-29 NOTE — ED Notes (Signed)
Portable xray at bedside.

## 2021-03-29 NOTE — ED Provider Notes (Addendum)
Emergency Medicine Provider Triage Evaluation Note  Raymond Allen , a 60 y.o. male  was evaluated in triage.  Pt complains of shortness of breath.  Began about 3 to 4 weeks ago, gradually worsening to tonight when he felt he could not breathe.  He has been having intermittent chest tightness.  Does not typically wear oxygen at home.  He has been having significant lower extremity swelling, trying to elevate his legs without relief. Does have hx of DVT, on Xarelto. Compliant with home meds, per patient. Has cough productive of white sputum. No recent illnesses. Review of Systems  Positive: CP, SOB, LE edema Negative: Back pain  Physical Exam  BP 118/81   Pulse (!) 115   Temp 98 F (36.7 C)   Resp 19   SpO2 (!) 88%  Gen:   Awake, distress   Resp:  Increased effort, mild crackles at bases. 2 L [place] MSK:   Moves extremities without difficulty. Pitting edema BLE ABD:  Distended Other:    Medical Decision Making  Medically screening exam initiated at 4:45 AM.  Appropriate orders placed.  Raymond Allen was informed that the remainder of the evaluation will be completed by another provider, this initial triage assessment does not replace that evaluation, and the importance of remaining in the ED until their evaluation is complete.  Patient here with shortness of breath.  He appears clinically fluid overloaded however denies any prior history of CHF.  He is tachycardic, hypoxic requiring .  Nursing aware patient needs room in back.     Raymond Allen A, PA-C 31/59/45 [id number]    Raymond Fuel, MD 92/44/62 [phone]

## 2021-03-29 NOTE — Code Documentation (Signed)
RT at bedside with [facility]

## 2021-03-29 NOTE — Procedures (Signed)
Intubation Procedure Note  Raymond Allen  [id number]  1961/03/26  Date:03/29/21  Time:10:56 AM   Provider Performing:Johnell Landowski C Tamala Julian    Procedure: Intubation ([id number])  Indication(s) Respiratory Failure  Consent Unable to obtain consent due to emergent nature of procedure.   Anesthesia Etomidate, Versed, Fentanyl, and Rocuronium   Time Out Verified patient identification, verified procedure, site/side was marked, verified correct patient position, special equipment/implants available, medications/allergies/relevant history reviewed, required imaging and test results available.   Sterile Technique Usual hand hygeine, masks, and gloves were used   Procedure Description Patient positioned in bed supine.  Sedation given as noted above.  Patient was intubated with endotracheal tube using Glidescope.  View was Grade 1 full glottis .  Number of attempts was 1.  Colorimetric CO2 detector was consistent with tracheal placement.   Complications/Tolerance None; patient tolerated the procedure well. Chest X-ray is ordered to verify placement.   EBL Minimal   Specimen(s) None

## 2021-03-29 NOTE — Progress Notes (Signed)
RT transported patient from ED room 16 to 3M09 with RN. No complications. RT will continue to monitor.

## 2021-03-29 NOTE — Progress Notes (Signed)
Raymond Allen [id number] Admission Data: 03/29/2021 5:42 PM Attending Provider: Candee Furbish, MD  JIR:CVELFY, Cleta Alberts, NP Consults/ Treatment Team:   Raymond Allen is a 60 y.o. male patient admitted from ED awake, alert  & orientated  X 3,  Full Code, VSS - Blood pressure 120/89, pulse (!) 114, temperature 99.3 F (37.4 C), temperature source Oral, resp. rate (!) 24, height 6\' 1"  (1.854 m), weight 130.6 kg, SpO2 100 %., Vented Tele # [phone] placed and pt is currently running:sinus tachycardia.   IV site WDL:  wrist right, condition patent and no redness with a transparent dsg that's clean dry and intact.   Will cont to monitor and assist as needed.  Hosie Spangle, RN 03/29/2021 5:42 PM

## 2021-03-29 NOTE — Code Documentation (Signed)
Preparing for intubation for ongoing distress and unable to tolerate Bi-pap

## 2021-03-29 NOTE — ED Notes (Signed)
RT at bedside placed bipap

## 2021-03-30 ENCOUNTER — Inpatient Hospital Stay (HOSPITAL_COMMUNITY): Payer: Medicare HMO

## 2021-03-30 DIAGNOSIS — I340 Nonrheumatic mitral (valve) insufficiency: Secondary | ICD-10-CM

## 2021-03-30 DIAGNOSIS — R570 Cardiogenic shock: Secondary | ICD-10-CM

## 2021-03-30 DIAGNOSIS — J9601 Acute respiratory failure with hypoxia: Secondary | ICD-10-CM

## 2021-03-30 DIAGNOSIS — I5021 Acute systolic (congestive) heart failure: Secondary | ICD-10-CM

## 2021-03-30 LAB — COOXEMETRY PANEL
Carboxyhemoglobin: 1.1 % (ref 0.5–1.5)
Carboxyhemoglobin: 1.1 % (ref 0.5–1.5)
Methemoglobin: 0.9 % (ref 0.0–1.5)
Methemoglobin: 0.9 % (ref 0.0–1.5)
O2 Saturation: 84.2 %
O2 Saturation: 69.1 %
Total hemoglobin: 12.3 g/dL (ref 12.0–16.0)
Total hemoglobin: 12.2 g/dL (ref 12.0–16.0)

## 2021-03-30 LAB — MAGNESIUM: Magnesium: 2 mg/dL (ref 1.7–2.4)

## 2021-03-30 LAB — CBC
HCT: 40.4 % (ref 39.0–52.0)
Hemoglobin: 12.1 g/dL — ABNORMAL LOW (ref 13.0–17.0)
MCH: 24.8 pg — ABNORMAL LOW (ref 26.0–34.0)
MCHC: 30 g/dL (ref 30.0–36.0)
MCV: 82.8 fL (ref 80.0–100.0)
Platelets: 254 10*3/uL (ref 150–400)
RBC: 4.88 MIL/uL (ref 4.22–5.81)
RDW: 16 % — ABNORMAL HIGH (ref 11.5–15.5)
WBC: 7.3 10*3/uL (ref 4.0–10.5)
nRBC: 0.3 % — ABNORMAL HIGH (ref 0.0–0.2)

## 2021-03-30 LAB — COMPREHENSIVE METABOLIC PANEL
ALT: 14 U/L (ref 0–44)
AST: 14 U/L — ABNORMAL LOW (ref 15–41)
Albumin: 2.9 g/dL — ABNORMAL LOW (ref 3.5–5.0)
Alkaline Phosphatase: 40 U/L (ref 38–126)
Anion gap: 8 (ref 5–15)
BUN: 17 mg/dL (ref 8–23)
CO2: 27 mmol/L (ref 22–32)
Calcium: 8.6 mg/dL — ABNORMAL LOW (ref 8.9–10.3)
Chloride: 102 mmol/L (ref 98–111)
Creatinine, Ser: 1.31 mg/dL — ABNORMAL HIGH (ref 0.61–1.24)
GFR, Estimated: >60 mL/min (ref >60)
Glucose, Bld: 116 mg/dL — ABNORMAL HIGH (ref 70–99)
Potassium: 3.4 mmol/L — ABNORMAL LOW (ref 3.5–5.1)
Sodium: 137 mmol/L (ref 135–145)
Total Bilirubin: 2.4 mg/dL — ABNORMAL HIGH (ref 0.3–1.2)
Total Protein: 7 g/dL (ref 6.5–8.1)

## 2021-03-30 LAB — POCT I-STAT 7, (LYTES, BLD GAS, ICA,H+H)
Acid-Base Excess: 6 mmol/L — ABNORMAL HIGH (ref 0.0–2.0)
Bicarbonate: 31.5 mmol/L — ABNORMAL HIGH (ref 20.0–28.0)
Calcium, Ion: 1.15 mmol/L (ref 1.15–1.40)
HCT: 40 % (ref 39.0–52.0)
Hemoglobin: 13.6 g/dL (ref 13.0–17.0)
O2 Saturation: 98 %
Patient temperature: 99
Potassium: 3.4 mmol/L — ABNORMAL LOW (ref 3.5–5.1)
Sodium: 139 mmol/L (ref 135–145)
TCO2: 33 mmol/L — ABNORMAL HIGH (ref 22–32)
pCO2 arterial: 48.3 mmHg — ABNORMAL HIGH (ref 32.0–48.0)
pH, Arterial: 7.424 (ref 7.350–7.450)
pO2, Arterial: 104 mmHg (ref 83.0–108.0)

## 2021-03-30 LAB — LACTIC ACID, PLASMA: Lactic Acid, Venous: 0.9 mmol/L (ref 0.5–1.9)

## 2021-03-30 LAB — GLUCOSE, CAPILLARY
Glucose-Capillary: 107 mg/dL — ABNORMAL HIGH (ref 70–99)
Glucose-Capillary: 109 mg/dL — ABNORMAL HIGH (ref 70–99)
Glucose-Capillary: 121 mg/dL — ABNORMAL HIGH (ref 70–99)
Glucose-Capillary: 189 mg/dL — ABNORMAL HIGH (ref 70–99)
Glucose-Capillary: 194 mg/dL — ABNORMAL HIGH (ref 70–99)
Glucose-Capillary: 97 mg/dL (ref 70–99)

## 2021-03-30 LAB — TRIGLYCERIDES: Triglycerides: 98 mg/dL (ref <150)

## 2021-03-30 LAB — PHOSPHORUS: Phosphorus: 3.6 mg/dL (ref 2.5–4.6)

## 2021-03-30 MED ORDER — DIGOXIN 125 MCG PO TABS
0.1250 mg | ORAL_TABLET | Freq: Every day | ORAL | Status: DC
  Administered 2021-03-30 – 2021-04-06 (×8): 0.125 mg
  Filled 2021-03-30 (×8): qty 1

## 2021-03-30 MED ORDER — PROSOURCE TF PO LIQD
45.0000 mL | Freq: Four times a day (QID) | ORAL | Status: DC
  Administered 2021-03-30 – 2021-04-05 (×21): 45 mL
  Filled 2021-03-30 (×21): qty 45

## 2021-03-30 MED ORDER — POTASSIUM CHLORIDE 20 MEQ PO PACK: 40.0000 meq | PACK | ORAL | Status: DC

## 2021-03-30 MED ORDER — SODIUM CHLORIDE 0.9% FLUSH: 10.0000 mL | INTRAVENOUS | Status: DC | PRN

## 2021-03-30 MED ORDER — POTASSIUM CHLORIDE 10 MEQ/50ML IV SOLN
10.0000 meq | INTRAVENOUS | Status: AC
  Administered 2021-03-30 (×3): 10 meq via INTRAVENOUS
  Filled 2021-03-30 (×3): qty 50

## 2021-03-30 MED ORDER — PROSOURCE TF PO LIQD
45.0000 mL | Freq: Two times a day (BID) | ORAL | Status: DC
  Administered 2021-03-30: 12:00:00 45 mL
  Filled 2021-03-30: qty 45

## 2021-03-30 MED ORDER — VITAL HIGH PROTEIN PO LIQD
1000.0000 mL | ORAL | Status: DC
  Administered 2021-03-30 – 2021-04-03 (×6): 1000 mL
  Filled 2021-03-30: qty 1000

## 2021-03-30 MED ORDER — SODIUM CHLORIDE 0.9% FLUSH
10.0000 mL | Freq: Two times a day (BID) | INTRAVENOUS | Status: DC
  Administered 2021-03-30: 12:00:00 10 mL
  Administered 2021-03-30: 22:00:00 20 mL
  Administered 2021-03-31 (×2): 10 mL
  Administered 2021-04-01: 20 mL
  Administered 2021-04-01 – 2021-04-03 (×4): 10 mL
  Administered 2021-04-04: 22:00:00 30 mL
  Administered 2021-04-04 – 2021-04-06 (×3): 10 mL

## 2021-03-30 MED ORDER — SODIUM CHLORIDE 0.9 % IV SOLN: INTRAVENOUS | Status: DC | PRN

## 2021-03-30 MED ORDER — PANTOPRAZOLE SODIUM 40 MG IV SOLR
40.0000 mg | INTRAVENOUS | Status: DC
  Administered 2021-03-30 – 2021-03-31 (×2): 40 mg via INTRAVENOUS
  Filled 2021-03-30 (×2): qty 40

## 2021-03-30 MED ORDER — VITAL HIGH PROTEIN PO LIQD: 1000.0000 mL | ORAL | Status: DC

## 2021-03-30 NOTE — Progress Notes (Signed)
Unsure if OG was advanced after abdominal x-ray 12/13 at 14:16. Additional xray obtained to verify placement before meds given per tube at 20:26. Abdominal xray at 20:26 reads the port is in the region of the GE junction, consider further advancement for more optimal positioning. There is only about 4 inches of the tube sticking out of the patients mouth, does not appear to be coiled from what I can see in his mouth, no room to advance. Per tube meds given, but not easily pushed in. OG removed, it appeared to be coiled passed oral cavity. OG replaced, abdominal xray reordered for verification.

## 2021-03-30 NOTE — Procedures (Signed)
Arterial Catheter Insertion Procedure Note  Raymond Allen  [id number]  10/21/1960  Date:03/30/21  [id number]   Provider Performing: Kelle Darting    Procedure: Insertion of Arterial Line [phone]) without US guidance  Indication(s) Blood pressure monitoring and/or need for frequent ABGs  Consent Risks of the procedure as well as the alternatives and risks of each were explained to the patient and/or caregiver.  Consent for the procedure was obtained and is signed in the bedside chart  Anesthesia None   Time Out Verified patient identification, verified procedure, site/side was marked, verified correct patient position, special equipment/implants available, medications/allergies/relevant history reviewed, required imaging and test results available.   Sterile Technique Maximal sterile technique including full sterile barrier drape, hand hygiene, sterile gown, sterile gloves, mask, hair covering, sterile ultrasound probe cover (if used).   Procedure Description Area of catheter insertion was cleaned with chlorhexidine and draped in sterile fashion. With real-time ultrasound guidance an arterial catheter was placed into the right radial artery.  Appropriate arterial tracings confirmed on monitor.     Complications/Tolerance None; patient tolerated the procedure well.   EBL Minimal   Specimen(s) None

## 2021-03-30 NOTE — Progress Notes (Signed)
Initial Nutrition Assessment  DOCUMENTATION CODES:   Not applicable  INTERVENTION:   Initiate tube feeds via NG tube: - Vital High Protein @ 60 ml/hr (1440 ml/day) - ProSource 45 ml QID  Tube feeding regimen provides 1600 kcal, 170 grams of protein, and 1204 ml of H2O.   Tube feeding regimen and current propofol provides 2426 total kcal (100% of needs).  NUTRITION DIAGNOSIS:   Inadequate oral intake related to inability to eat as evidenced by NPO status.  GOAL:   Patient will meet greater than or equal to 90% of their needs  MONITOR:   Vent status, Labs, Weight trends, TF tolerance, I & O's  REASON FOR ASSESSMENT:   Ventilator, Consult Enteral/tube feeding initiation and management  ASSESSMENT:   60 year old male who presented to the ED with SOB. PMH of HTN, T2DM, HLD, DVT, colon cancer with liver mets s/p partial colectomy/partial hepatectomy in 2001, venous stasis with ulceration. Pt admitted with acute hypoxic respiratory failure secondary to new onset CHF.  12/13 - intubated  Discussed pt with RN and during ICU rounds.  Consult received for tube feeding initiation and management. Pt with OG tube tip and side port below GE junction.  Reviewed weight history in chart. No weights available since August 2019. Weight appears fairly stable since that time. However, suspect dry weight is below current weight given edema and volume overload.  Patient is currently intubated on ventilator support MV: 10.9 L/min Temp (24hrs), Avg:99.1 F (37.3 C), Min:97.9 F (36.6 C), Max:99.8 F (37.7 C)  Drips: Propofol: 31.3 ml/hr (provides 826 kcal daily from lipid) Fentanyl Levophed  Medications reviewed and include: IV lasix, SSI q 4 hours, IV protonix, IV KCl 10 mEq x 3 runs  Labs reviewed: potassium 3.4, creatinine 1.31, hemoglobin A1C 7.7 CBG's: 100-134 x 24 hours  UOP: 4600 ml x 24 hours I/O's: -5.3 L since admit  NUTRITION - FOCUSED PHYSICAL EXAM:  Flowsheet Row  Most Recent Value  Orbital Region No depletion  Upper Arm Region No depletion  Thoracic and Lumbar Region No depletion  Buccal Region Unable to assess  Temple Region No depletion  Clavicle Bone Region No depletion  Clavicle and Acromion Bone Region No depletion  Scapular Bone Region Unable to assess  Dorsal Hand No depletion  Patellar Region No depletion  Anterior Thigh Region No depletion  Posterior Calf Region No depletion  Edema (RD Assessment) Moderate  [BUE, BLE]  Hair Reviewed  Eyes Unable to assess  Mouth Unable to assess  Skin Reviewed  Nails Reviewed       Diet Order:   Diet Order             Diet NPO time specified  Diet effective now                   EDUCATION NEEDS:   Not appropriate for education at this time  Skin:  Skin Assessment: Skin Integrity Issues: Other: skin tear RLE  Last BM:  03/29/21  Height:   Ht Readings from Last 1 Encounters:  03/29/21 6\' 1"  (1.854 m)    Weight:   Wt Readings from Last 1 Encounters:  03/30/21 128.7 kg    Ideal Body Weight:  83.6 kg  BMI:  Body mass index is 37.43 kg/m.  Estimated Nutritional Needs:   Kcal:  2200-2400  Protein:  165-180 grams  Fluid:  >/= 1.8 L    Gustavus Bryant, MS, RD, LDN Inpatient Clinical Dietitian Please see AMiON for contact information.

## 2021-03-30 NOTE — Procedures (Signed)
Cortrak  Person Inserting Tube:  Esaw Dace, RD Tube Type:  Cortrak - 43 inches Tube Size:  10 Tube Location:  Left nare Initial Placement:  Stomach Secured by: Bridle Technique Used to Measure Tube Placement:  Marking at nare/corner of mouth Cortrak Secured At:  69 cm Procedure Comments:  Cortrak Tube Team Note:  Consult received to place a Cortrak feeding tube.   X-ray is required, abdominal x-ray has been ordered by the Cortrak team. Please confirm tube placement before using the Cortrak tube.   If the tube becomes dislodged please keep the tube and contact the Cortrak team at [email or web address] ([email or web address]) for replacement.  If after hours and replacement cannot be delayed, place a NG tube and confirm placement with an abdominal x-ray.   Kerman Passey MS, RDN, LDN, CNSC Registered Dietitian III Clinical Nutrition RD Pager and On-Call Pager Number Located in [place]

## 2021-03-30 NOTE — Progress Notes (Addendum)
NAME:  Raymond Allen, MRN:  [id number], DOB:  [birth year], LOS: 1 ADMISSION DATE:  03/29/2021, CONSULTATION DATE:  03/29/21 REFERRING MD:  Dr. Jerrol Banana, CHIEF COMPLAINT:  Short of breath   History of Present Illness:  60 yo male former smoker presented with several weeks of dyspnea and edema.  This was associated with cough with clear sputum.  Found to have hypervolemia with pulmonary edema.  Failed Bipap and required intubation.  Pertinent  Medical History  DM type 2, DVT on xarelto, HTN, Colon cancer s/p partial colectomy 2001 with liver mets s/p Lt hepatectomy, CKD 3a, ED  Significant Hospital Events: Including procedures, antibiotic start and stop dates in addition to other pertinent events   12/13 Admit, cardiology consulted, intubated  Studies:  Echo 03/29/21 >> EF 25 to 30%, RVSP 38 mmHg, mod LA dilation, mod/severe MR  Interim History / Subjective:  Remains sedated on increased PEEP/FiO2 and pressors.  Objective   Blood pressure 95/82, pulse (!) 113, temperature 99.8 F (37.7 C), temperature source Axillary, resp. rate (!) 24, height 6\' 1"  (1.854 m), weight 128.7 kg, SpO2 100 %. CVP:  [10 mmHg-17 mmHg] 17 mmHg  Vent Mode: PRVC FiO2 (%):  [60 %-100 %] 60 % Set Rate:  [24 bmp] 24 bmp Vt Set:  [630 mL] 630 mL PEEP:  [12 cmH20] 12 cmH20 Plateau Pressure:  [26 cmH20-30 cmH20] 27 cmH20   Intake/Output Summary (Last 24 hours) at 03/30/2021 0839 Last data filed at 03/30/2021 0813 Gross per 24 hour  Intake 1405.18 ml  Output 4220 ml  Net -2814.82 ml   Filed Weights   03/29/21 0446 03/30/21 0418  Weight: 130.6 kg 128.7 kg    Examination:  General - sedated Eyes - pupils reactive ENT - ETT in place Cardiac - regular, tachycardic, 2/6 SM Chest - scattered rhonchi Abdomen - soft, non tender, + bowel sounds Extremities - 2+ pitting edema Skin - no rashes Neuro - RASS -2  Resolved Hospital Problem list     Assessment & Plan:   Acute hypoxic respiratory failure from  acute pulmonary edema. - adjust PEEP/FiO2 to keep SpO2 > 92% - f/u CXR - goal negative fluid balance; continue lasix 80 mg bid  Acute systolic CHF with mitral regurgitation. Cardiogenic shock. - pressors to keep MAP > 65 - cardiology following - hold outpt norvasc, HCTZ, lisinopril, crestor  Hx of DVT. - continue xarelto  Hypokalemia. CKD 3a. - f/u BMET  Acute metabolic encephalopathy from hypoxic respiratory failure. - RASS goal 0 to -1  DM type 2 with neuropathy. - SSI - hold outpt metformin, ozempic - continue neurontin  Best Practice (right click and "Reselect all SmartList Selections" daily)   Diet/type: tubefeeds DVT prophylaxis: DOAC GI prophylaxis: PPI Lines: Central line Foley:  N/A Code Status:  full code Last date of multidisciplinary goals of care discussion [x]   Labs    CMP Latest Ref Rng & Units 03/30/2021 03/29/2021 03/29/2021  Glucose 70 - 99 mg/dL 116(H) - 154(H)  BUN 8 - 23 mg/dL 17 - 20  Creatinine 0.61 - 1.24 mg/dL 1.31(H) - 1.59(H)  Sodium 135 - 145 mmol/L 137 141 136  Potassium 3.5 - 5.1 mmol/L 3.4(L) 3.1(L) 4.1  Chloride 98 - 111 mmol/L 102 - 101  CO2 22 - 32 mmol/L 27 - 26  Calcium 8.9 - 10.3 mg/dL 8.6(L) - 9.0  Total Protein 6.5 - 8.1 g/dL 7.0 - -  Total Bilirubin 0.3 - 1.2 mg/dL 2.4(H) - -  Alkaline Phos 38 -  126 U/L 40 - -  AST 15 - 41 U/L 14(L) - -  ALT 0 - 44 U/L 14 - -    CBC Latest Ref Rng & Units 03/30/2021 03/29/2021 03/29/2021  WBC 4.0 - 10.5 K/uL 7.3 - 8.5  Hemoglobin 13.0 - 17.0 g/dL 12.1(L) 13.6 12.8(L)  Hematocrit 39.0 - 52.0 % 40.4 40.0 43.0  Platelets 150 - 400 K/uL 254 - 260    ABG    Component Value Date/Time   PHART 7.405 03/29/2021 1219   PCO2ART 44.8 03/29/2021 1219   PO2ART 102 03/29/2021 1219   HCO3 28.2 (H) 03/29/2021 1219   TCO2 30 03/29/2021 1219   ACIDBASEDEF 7.0 (H) 11/29/2007 0320   O2SAT 84.2 03/30/2021 0408    CBG (last 3)  Recent Labs    03/29/21 2333 03/30/21 0347 03/30/21 0728   GLUCAP 100* 109* 121*    Critical care time: 38 minutes  Chesley Mires, MD [place] Pager - [phone] 03/30/2021, 9:01 AM

## 2021-03-30 NOTE — Progress Notes (Addendum)
Progress Note  Patient Name: MAOR MECKEL Date of Encounter: 03/30/2021  [facility] Cardiologist: None   Subjective   He is not doing well.  Blood pressure is drifting down despite being on norepinephrine.  Inpatient Medications    Scheduled Meds:  chlorhexidine gluconate (MEDLINE KIT)  15 mL Mouth Rinse BID   Chlorhexidine Gluconate Cloth  6 each Topical Daily   feeding supplement (PROSource TF)  45 mL Per Tube BID   feeding supplement (VITAL HIGH PROTEIN)  1,000 mL Per Tube Q24H   furosemide  80 mg Intravenous BID   gabapentin  600 mg Per Tube BID WC   gabapentin  900 mg Per Tube QHS   insulin aspart  0-20 Units Subcutaneous Q4H   mouth rinse  15 mL Mouth Rinse 10 times per day   pantoprazole (PROTONIX) IV  40 mg Intravenous Q24H   rivaroxaban  20 mg Per Tube Q supper   sodium chloride flush  10-40 mL Intracatheter Q12H   Continuous Infusions:  fentaNYL infusion INTRAVENOUS 100 mcg/hr (03/30/21 0813)   norepinephrine (LEVOPHED) Adult infusion 6 mcg/min (03/30/21 0813)   potassium chloride 10 mEq (03/30/21 1213)   propofol (DIPRIVAN) infusion 40 mcg/kg/min (03/30/21 1041)   PRN Meds: cyclobenzaprine, docusate sodium, fentaNYL, polyethylene glycol, sodium chloride flush   Vital Signs    Vitals:   03/30/21 0800 03/30/21 0815 03/30/21 1120 03/30/21 1127  BP: 95/82 95/82 95/76   Pulse: (!) 113  (!) 121   Resp: (!) 24  18   Temp:    99.6 F (37.6 C)  TempSrc:    Axillary  SpO2: 100%  100%   Weight:      Height:        Intake/Output Summary (Last 24 hours) at 03/30/2021 1217 Last data filed at 03/30/2021 0813 Gross per 24 hour  Intake 1404.41 ml  Output 2520 ml  Net -1115.59 ml   Last 3 Weights 03/30/2021 03/29/2021 11/22/2017  Weight (lbs) 283 lb 11.7 oz 288 lb 290 lb  Weight (kg) 128.7 kg 130.636 kg 131.543 kg      Telemetry    Sinus tachycardia- Personally Reviewed  ECG    On presentation was in sinus tachycardia- Personally  Reviewed  Physical Exam  Intubated and unresponsive GEN: No acute distress.   Neck: No JVD Cardiac: RRR, apical systolic murmur, no rub, but with a summation gallop Respiratory: Clear to auscultation bilaterally. GI: Soft, nontender, non-distended  MS: No edema; No deformity. Neuro:  Nonfocal  Psych: Normal affect   Labs    High Sensitivity Troponin:   Recent Labs  Lab 03/29/21 0501 03/29/21 0628  TROPONINIHS 44* 39*     Chemistry Recent Labs  Lab 03/29/21 0501 03/29/21 1219 03/30/21 0408  NA 136 141 137  K 4.1 3.1* 3.4*  CL 101  --  102  CO2 26  --  27  GLUCOSE 154*  --  116*  BUN 20  --  17  CREATININE 1.59*  --  1.31*  CALCIUM 9.0  --  8.6*  MG  --   --  2.0  PROT  --   --  7.0  ALBUMIN  --   --  2.9*  AST  --   --  14*  ALT  --   --  14  ALKPHOS  --   --  40  BILITOT  --   --  2.4*  GFRNONAA 49*  --  >60  ANIONGAP 9  --  8    Lipids  Recent Labs  Lab 03/30/21 0408  TRIG 98    Hematology Recent Labs  Lab 03/29/21 0501 03/29/21 1219 03/30/21 0408  WBC 8.5  --  7.3  RBC 5.14  --  4.88  HGB 12.8* 13.6 12.1*  HCT 43.0 40.0 40.4  MCV 83.7  --  82.8  MCH 24.9*  --  24.8*  MCHC 29.8*  --  30.0  RDW 15.7*  --  16.0*  PLT 260  --  254   Thyroid No results for input(s): TSH, FREET4 in the last 168 hours.  BNP Recent Labs  Lab 03/29/21 0502  BNP 391.6*    DDimer No results for input(s): DDIMER in the last 168 hours.   Radiology    DG Abdomen 1 View  Result Date: 03/29/2021 CLINICAL DATA:  Placement of enteric tube EXAM: ABDOMEN - 1 VIEW COMPARISON:  Place the tip and side port within the stomach. FINDINGS: There is interval placement of enteric tube with its tip close to the gastroesophageal junction. Side port is in the lower thoracic esophagus. Enteric tube should be advanced 10 cm. Upper lung fields are not included in the radiograph. Transverse diameter of heart is increased. Central pulmonary vessels are prominent. Breathing motion limits  evaluation of the lung fields. IMPRESSION: Tip of enteric tube is at the gastroesophageal junction. Side port in the enteric tube is in the lower thoracic esophagus. Enteric tube should be advanced 10 cm. These results will be called to the ordering clinician or representative by the Radiologist Assistant, and communication documented in the PACS or [employer]. Electronically Signed   By: Elmer Picker M.D.   On: 03/29/2021 11:57   DG Chest Portable 1 View  Result Date: 03/29/2021 CLINICAL DATA:  Endotracheal tube and central line placement. EXAM: PORTABLE CHEST 1 VIEW COMPARISON:  Chest radiograph performed earlier on the same date FINDINGS: The heart is enlarged. Prominence of the pulmonary vessels. Bilateral lower lobe hazy opacities concerning for pulmonary edema and/or small effusions. Endotracheal tube with distal tip at the level of the clavicular heads. Feeding tube coursing below the diaphragm with side port likely in the distal esophagus. Left IJ access central line with distal tip in the SVC. IMPRESSION: 1. Stable cardiomegaly with pulmonary edema and/or small effusions, suggesting CHF. 2. Endotracheal tube with distal tip approximately 7 cm above the carina. 3. Feeding tube coursing below the diaphragm with side port likely in the distal esophagus. It could be advanced 5-7 cm. 4.  Left IJ access central line with distal tip in the SVC. Electronically Signed   By: Keane Police D.O.   On: 03/29/2021 11:12   DG Chest Portable 1 View  Result Date: 03/29/2021 CLINICAL DATA:  60 year old male with history of shortness of breath, hypoxia and lower extremity edema. EXAM: PORTABLE CHEST 1 VIEW COMPARISON:  Chest x-ray 01/22/2018. FINDINGS: There is cephalization of the pulmonary vasculature, indistinctness of the interstitial markings, and patchy airspace disease throughout the lungs bilaterally suggestive of moderate pulmonary edema. Small bilateral pleural effusions. Moderate cardiomegaly.  Upper mediastinal contours are within normal limits. IMPRESSION: 1. The appearance the chest suggests moderate congestive heart failure, as above. Electronically Signed   By: Vinnie Langton M.D.   On: 03/29/2021 05:25   DG Abd Portable 1V  Result Date: 03/30/2021 CLINICAL DATA:  Orogastric tube placement EXAM: PORTABLE ABDOMEN - 1 VIEW COMPARISON:  Yesterday FINDINGS: The enteric tube tip is at the left upper quadrant and presumably in  the stomach. Side port is in the region of the GE junction, unchanged. Streaky density at the left more than right base. No gas dilated bowel. IMPRESSION: Unchanged positioning of the enteric tube with tip at the stomach and side port near the GE junction. Electronically Signed   By: Jorje Guild M.D.   On: 03/30/2021 05:39   DG Abd Portable 1V  Result Date: 03/29/2021 CLINICAL DATA:  NG tube placement EXAM: PORTABLE ABDOMEN - 1 VIEW COMPARISON:  03/29/2021 FINDINGS: Esophageal tube has been slightly advanced, the side port projects over distal esophageal region, tip overlies the mid stomach. Extensive surgical clips in the upper abdomen. IMPRESSION: Esophageal tube tip overlies the stomach, the side port is in the region of the GE junction, consider further advancement for more optimal positioning. Electronically Signed   By: Donavan Foil M.D.   On: 03/29/2021 20:30   DG Abd Portable 1V  Result Date: 03/29/2021 CLINICAL DATA:  Orogastric tube placement. EXAM: PORTABLE ABDOMEN - 1 VIEW COMPARISON:  Radiographs 03/29/2021.  CT 12/17/2019. FINDINGS: 1410 hours. Single-view centered on the upper abdomen. Enteric tube position is similar to prior radiograph, with tip near the gastroesophageal junction and side hole overlying the distal esophagus. The visualized bowel gas pattern is nonobstructive. Multiple surgical clips are present within the upper abdomen. There are patchy left greater than right basilar airspace opacities and a probable small left pleural effusion.  Multiple telemetry leads overlie the chest and upper abdomen. IMPRESSION: Unchanged position of the enteric tube from earlier radiograph, tip near the expected gastroesophageal junction. Electronically Signed   By: Richardean Sale M.D.   On: 03/29/2021 14:21   ECHOCARDIOGRAM COMPLETE  Result Date: 03/29/2021    ECHOCARDIOGRAM REPORT   Patient Name:   KASHUS KARLEN Date of Exam: 03/29/2021 Medical Rec #:  [id number]        Height:       73.0 in Accession #:    [id number]       Weight:       288.0 lb Date of Birth:  June 20, 1960        BSA:          2.512 m Patient Age:    28 years         BP:           134/93 mmHg Patient Gender: M                HR:           119 bpm. Exam Location:  Inpatient Procedure: 2D Echo, Color Doppler, Cardiac Doppler and Intracardiac            Opacification Agent STAT ECHO Indications:    Pulmonary edema  History:        Patient has no prior history of Echocardiogram examinations.                 Risk Factors:Hypertension and Diabetes.  Sonographer:    Jyl Heinz Referring Phys: [id number] New Site  1. No left ventricular thrombus (with Definity). Left ventricular ejection fraction, by estimation, is 25 to 30%. The left ventricle has severely decreased function. The left ventricle demonstrates global hypokinesis. The left ventricular internal cavity size was severely dilated. There is mild eccentric left ventricular hypertrophy. Indeterminate diastolic filling due to E-A fusion.  2. Right ventricular systolic function is mildly reduced. The right ventricular size is mildly enlarged. There is mildly elevated pulmonary artery systolic pressure. The estimated right ventricular  mmHg.  3. Left atrial size was moderately dilated.  4. The mitral valve is abnormal. Moderate to severe mitral valve regurgitation.  5. The aortic valve is tricuspid. Aortic valve regurgitation is mild. Aortic valve sclerosis/calcification is present, without any  evidence of aortic stenosis.  6. The inferior vena cava is normal in size with <50% respiratory variability, suggesting right atrial pressure of 8 mmHg. FINDINGS  Left Ventricle: No left ventricular thrombus (with Definity). Left ventricular ejection fraction, by estimation, is 25 to 30%. The left ventricle has severely decreased function. The left ventricle demonstrates global hypokinesis. The left ventricular internal cavity size was severely dilated. There is mild eccentric left ventricular hypertrophy. Indeterminate diastolic filling due to E-A fusion. Right Ventricle: The right ventricular size is mildly enlarged. No increase in right ventricular wall thickness. Right ventricular systolic function is mildly reduced. There is mildly elevated pulmonary artery systolic pressure. The tricuspid regurgitant  velocity is 2.74 m/s, and with an assumed right atrial pressure of 8 mmHg, the estimated right ventricular systolic pressure is 38.0 mmHg. Left Atrium: Left atrial size was moderately dilated. Right Atrium: Right atrial size was normal in size. Pericardium: There is no evidence of pericardial effusion. Mitral Valve: The mitral valve is abnormal. Moderate to severe mitral valve regurgitation, with centrally-directed jet. Tricuspid Valve: The tricuspid valve is normal in structure. Tricuspid valve regurgitation is mild. Aortic Valve: The aortic valve is tricuspid. Aortic valve regurgitation is mild. Aortic regurgitation PHT measures 496 msec. Aortic valve sclerosis/calcification is present, without any evidence of aortic stenosis. Aortic valve peak gradient measures 3.4  mmHg. Pulmonic Valve: The pulmonic valve was grossly normal. Pulmonic valve regurgitation is not visualized. Aorta: The aortic root and ascending aorta are structurally normal, with no evidence of dilitation. Venous: The inferior vena cava is normal in size with less than 50% respiratory variability, suggesting right atrial pressure of 8 mmHg.  IAS/Shunts: No atrial level shunt detected by color flow Doppler. Additional Comments: A device lead is visualized in the right ventricle.  LEFT VENTRICLE PLAX 2D LVIDd:         7.20 cm      Diastology LVIDs:         6.00 cm      LV e' medial:    3.81 cm/s LV PW:         1.10 cm      LV E/e' medial:  27.6 LV IVS:        1.30 cm      LV e' lateral:   8.70 cm/s LVOT diam:     2.40 cm      LV E/e' lateral: 12.1 LV SV:         35 LV SV Index:   14 LVOT Area:     4.52 cm²  LV Volumes (MOD) LV vol d, MOD A2C: 296.0 ml LV vol d, MOD A4C: 258.0 ml LV vol s, MOD A2C: 205.0 ml LV vol s, MOD A4C: 177.0 ml LV SV MOD A2C:     91.0 ml LV SV MOD A4C:     258.0 ml LV SV MOD BP:      87.0 ml RIGHT VENTRICLE            IVC RV Basal diam:  4.10 cm    IVC diam: 1.10 cm RV Mid diam:    3.80 cm RV S prime:     8.92 cm/s TAPSE (M-mode): 1.8 cm LEFT ATRIUM                Index        RIGHT ATRIUM           Index LA diam:        4.20 cm  1.67 cm/m²   RA Area:     16.40 cm² LA Vol (A2C):   101.0 ml 40.20 ml/m²  RA Volume:   40.30 ml  16.04 ml/m² LA Vol (A4C):   96.2 ml  38.29 ml/m² LA Biplane Vol: 102.0 ml 40.60 ml/m²  AORTIC VALVE AV Area (Vmax): 2.92 cm² AV Vmax:        91.70 cm/s AV Peak Grad:   3.4 mmHg LVOT Vmax:      59.10 cm/s LVOT Vmean:     44.600 cm/s LVOT VTI:       0.076 m AI PHT:         496 msec  AORTA Ao Root diam: 3.60 cm Ao Asc diam:  3.40 cm MITRAL VALVE                TRICUSPID VALVE MV Area (PHT): 6.12 cm²     TR Peak grad:   30.0 mmHg MV Decel Time: 124 msec     TR Vmax:        274.00 cm/s MR Peak grad: 67.2 mmHg MR Mean grad: 47.5 mmHg     SHUNTS MR Vmax:      410.00 cm/s   Systemic VTI:  0.08 m MR Vmean:     334.0 cm/s    Systemic Diam: 2.40 cm MV E velocity: 105.00 cm/s Mihai Croitoru MD Electronically signed by Mihai Croitoru MD Signature Date/Time: 03/29/2021/10:50:12 AM    Final    ° °Cardiac Studies  ° °2D Doppler echocardiogram 03/29/2021: °IMPRESSIONS  ° ° ° 1. No left ventricular thrombus (with Definity). Left  ventricular  °ejection fraction, by estimation, is 25 to 30%. The left ventricle has  °severely decreased function. The left ventricle demonstrates global  °hypokinesis. The left ventricular internal  °cavity size was severely dilated. There is mild eccentric left ventricular  °hypertrophy. Indeterminate diastolic filling due to E-A fusion.  ° 2. Right ventricular systolic function is mildly reduced. The right  °ventricular size is mildly enlarged. There is mildly elevated pulmonary  °artery systolic pressure. The estimated right ventricular systolic  °pressure is 38.0 mmHg.  ° 3. Left atrial size was moderately dilated.  ° 4. The mitral valve is abnormal. Moderate to severe mitral valve  °regurgitation.  ° 5. The aortic valve is tricuspid. Aortic valve regurgitation is mild.  °Aortic valve sclerosis/calcification is present, without any evidence of  °aortic stenosis.  ° 6. The inferior vena cava is normal in size with <50% respiratory  °variability, suggesting right atrial pressure of 8 mmHg.  ° ° °Patient Profile  °   °60 y.o. male a hx of hypertension, DVT on Xarelto, type 2 DM, colon cancer s/p partical colectomy 2001 and mets to the liver s/p L hepatectomy who is being seen 03/29/2021 for the evaluation of CHF at the request of Dr.  with critical care.  ° °Assessment & Plan  °  °Acute on chronic systolic heart failure: Continue diuresis as tolerated by blood pressure and kidney function.  Mixed venous co-oximetry today is 84%.  Will discuss with advanced heart failure service. °Moderate to severe mitral regurgitation: Moderate to severe MR being aggravated by pressure therapy.  Suspect MR secondary to dilated LV.  May be a candidate for intra-aortic balloon pump (discuss with advanced heart failure team). °Acute kidney   injury: Kidney function has improved with diuresis.  Continue IV diuresis tolerated by blood pressure and kidney numbers. °Primary hypertension: Hypotensive now on Levophed. °Hypoxic  respiratory failure: Continue vent support. °Tachycardia: Possibly related to volume status and pressor therapy. ° ° ° °For questions or updates, please contact [facility] °Please consult [email or web address] for contact info under  ° °  °   °Signed, ° W  III, MD  °03/30/2021, 12:17 PM   ° °

## 2021-03-30 NOTE — Progress Notes (Signed)
[facility] Progress Note Patient Name: Raymond Allen DOB: 08/22/1960 MRN: [id number]   Date of Service  03/30/2021  HPI/Events of Note  Patient is on Norepinephrine gtt and there has been difficulty obtaining reliable non-invasive BP readings.  eICU Interventions  Arterial line ordered.        Kerry Kass Marnie Fazzino 03/30/2021, 7:43 PM

## 2021-03-30 NOTE — Consult Note (Addendum)
Advanced Heart Failure Team Consult Note   Primary Physician: Martin[place], Julie M, NP PCP-Cardiologist:  Dr. Tamala Julian (new)   Reason for Consultation: Acute Systolic Heart Failure (new)  HPI:    [name] is seen today for evaluation of acute systolic heart failure at the request of Dr. Tamala Julian, Cardiology.   Pt currently intubated and sedated. History below obtained from chart review and by his fiance.  60 y/o AAM w/ remote h/o stage III colon cancer, diagnosed in October 2001. He received initial adjuvant chemotherapy and underwent partial colectomy. He progressed to liver cancer in October 2002. He underwent partial hepatectomy at [facility] in February 2003. He received neoadjuvant chemotherapy with FOLFIRI then adjuvant FOLFOX following the surgery. He was put on maintenance Xeloda. He has been off all chemotherapy since September of 2003.  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  No known FH of CHF.   Roughly 1 month ago, he had cold-like symptoms/ bad cough but negative home COVID test.   Roughly 2 weeks ago, he was placed on outpatient antibiotics for LE cellulitis. Finished tx w/ doxycyline.   Over the last 2 months, he has complained of progressive SOB and decreased exercise tolerance. Struggling w/ ADLs. Presented to ED on 12/13. Found to be in acute hypoxic respiratory failure requiring BiPAP. Moderately hypertensive and in acute CHF. CXR w/ cardiomegaly, pulmonary edema and small bilateral pleural effusions. BNP 391. He also complained of chest tightness in the ED. Hs trop 44>>39. EKG sinus tach, slight inferolateral ST depressions. COVID and flu negative. WBC 7.3, Hgb 12.1, Na 137, K 3.1 CO2 26, SCr 1.59. Placed on IV Lasix and nitro gtt but developed worsening respiratory failure requiring emergent intubation. Developed hypotension requiring initiation of Levophed. Central line placed. Admitted to ICU.   Echo obtained and  showed biventricular dysfunction. LVEF severely reduced 25-30% w/ global HK, RV mildly reduced , mildly elevated RVSP 38 mmH, Mod-severe MR.   Co-ox ok, 84%. He is diuresing w/ IV Lasix. 4.6L in UOP yesterday. 2.2L out thus far today. Wt down 5 lb. CVP 10-11.   Scr improving, 1.56>>1.31. K 3.4, getting K supp   Still w/ NE requirements but RN able to wean down from 6>>2 mcg currently. SPBs mid 90s.   Remains intubated and sedated.    Echo 03/29/21  1. No left ventricular thrombus (with Definity). Left ventricular  ejection fraction, by estimation, is 25 to 30%. The left ventricle has  severely decreased function. The left ventricle demonstrates global  hypokinesis. The left ventricular internal  cavity size was severely dilated. There is mild eccentric left ventricular  hypertrophy. Indeterminate diastolic filling due to E-A fusion.   2. Right ventricular systolic function is mildly reduced. The right  ventricular size is mildly enlarged. There is mildly elevated pulmonary  artery systolic pressure. The estimated right ventricular systolic  pressure is 71.0 mmHg.   3. Left atrial size was moderately dilated.   4. The mitral valve is abnormal. Moderate to severe mitral valve  regurgitation.   5. The aortic valve is tricuspid. Aortic valve regurgitation is mild.  Aortic valve sclerosis/calcification is present, without any evidence of  aortic stenosis.   6. The inferior vena cava is normal in size with <50% respiratory  variability, suggesting right atrial pressure of 8 mmHg.  Review of Systems: [y] = yes, _0  = no   General: Weight gain _1 ; Weight loss _2 ;  Anorexia _0 ; Fatigue _1 ; Fever _2 ; Chills _3 ; Weakness _4   Cardiac: Chest pain/pressure [ Y]; Resting SOB [Y ]; Exertional SOB [ Y]; Orthopnea _5 ; Pedal Edema _6 ; Palpitations _7 ; Syncope _8 ; Presyncope _9 ; Paroxysmal nocturnal dyspnea_10   Pulmonary: Cough _11 ; Wheezing_12 ; Hemoptysis_13 ; Sputum _14 ; Snoring _15   GI:  Vomiting_16 ; Dysphagia_17 ; Melena_18 ; Hematochezia _19 ; Heartburn_20 ; Abdominal pain _21 ; Constipation _22 ; Diarrhea _23 ; BRBPR _24   GU: Hematuria_25 ; Dysuria _26 ; Nocturia_27   Vascular: Pain in legs with walking _28 ; Pain in feet with lying flat _29 ; Non-healing sores _30 ; Stroke _31 ; TIA _32 ; Slurred speech _33 ;  Neuro: Headaches_34 ; Vertigo_35 ; Seizures_36 ; Paresthesias_37 ;Blurred vision _38 ; Diplopia _39 ; Vision changes _40   Ortho/Skin: Arthritis _41 ; Joint pain _42 ; Muscle pain _43 ; Joint swelling _44 ; Back Pain _45 ; Rash _46   Psych: Depression_47 ; Anxiety_48   Heme: Bleeding problems _49 ; Clotting disorders _50 ; Anemia _51   Endocrine: Diabetes [Y ]; Thyroid dysfunction_52   Home Medications Prior to Admission medications   Medication Sig Start Date End Date Taking? Authorizing Provider  amLODipine (NORVASC) 5 MG tablet Take 1 tablet (5 mg total) by mouth daily. 07/30/13  Yes Tresa Garter, MD  cyclobenzaprine (FLEXERIL) 10 MG tablet Take 10 mg by mouth 3 (three) times daily as needed for muscle spasms. 08/12/13  Yes [provider]  gabapentin (NEURONTIN) 300 MG capsule Take 600-900 mg by mouth See admin instructions. Take 600 mg by mouth in the morning and afternoon, then 967m at bedtime 10/23/17  Yes [provider]  hydrochlorothiazide (HYDRODIURIL) 25 MG tablet Take 25 mg by mouth daily. 09/05/17  Yes [provider]  lisinopril (ZESTRIL) 5 MG tablet Take 5 mg by mouth daily. 01/24/21  Yes [provider]  metFORMIN (GLUCOPHAGE-XR) 500 MG 24 hr tablet Take 1,000 mg by mouth 2 (two) times daily. 10/16/17  Yes [provider]  oxyCODONE-acetaminophen (PERCOCET/ROXICET) 5-325 MG tablet Take 1 tablet by mouth every 6 (six) hours as needed for severe pain. 11/28/17  Yes CClovis Riley MD  OZEMPIC, 1 MG/DOSE, 4 MG/3ML SOPN Inject 1 mg into the skin every Monday. 02/04/21  Yes [provider]  rosuvastatin (CRESTOR) 20 MG tablet Take 20  mg by mouth daily. 03/10/21  Yes [provider]  sildenafil (REVATIO) 20 MG tablet Take 20 mg by mouth daily as needed (ED).   Yes [provider]  TRESIBA FLEXTOUCH 200 UNIT/ML FlexTouch Pen Inject 30 Units into the skin every evening. 03/22/21  Yes [provider]  XARELTO 20 MG TABS tablet Take 20 mg by mouth at bedtime. 03/05/21  Yes [provider]  doxycycline (VIBRA-TABS) 100 MG tablet Take 100 mg by mouth See admin instructions. Bid x 10 days Patient not taking: Reported on 03/29/2021    [provider]  furosemide (LASIX) 20 MG tablet Take 20 mg by mouth See admin instructions. Qd x 3 days Patient not taking: Reported on 03/29/2021 09/02/20   [provider]    Past Medical History: Past Medical History:  Diagnosis Date   Cancer ([facility]    [place]   [place]    [place] ca dx 2001;   Diabetes mellitus [place] complication [place]    Hypertension  Muscle weakness-general 01/05/2012    Past Surgical History: Past Surgical History:  Procedure Laterality Date   CHOLECYSTECTOMY     COLON SURGERY     sigmoid colectomy, appendectomy   INCISIONAL HERNIA REPAIR N/A 11/27/2017   Procedure: LAPAROSCOPIC ASSISTED INCISIONAL HERNIA;  Surgeon: Clovis Riley, MD;  Location: [facility];  Service: General;  Laterality: N/A;   INSERTION OF MESH N/A 11/27/2017   Procedure: INSERTION OF MESH;  Surgeon: Clovis Riley, MD;  Location: [facility];  Service: General;  Laterality: N/A;   LAPAROSCOPIC LYSIS OF ADHESIONS N/A 11/27/2017   Procedure: LAPAROSCOPIC LYSIS OF ADHESIONS;  Surgeon: Clovis Riley, MD;  Location: [facility];  Service: General;  Laterality: N/A;   LIVER SURGERY     partial removal [facility] in 2003   PORT-A-CATH REMOVAL Right 07/16/2012   Procedure: REMOVAL PORT-A-CATH;  Surgeon: Haywood Lasso, MD;  Location: [place];  Service: General;  Laterality: Right;     Family History: No family history on file.  Social History: Social History   Socioeconomic History   Marital status: Single    Spouse name: Not on file   Number of children: Not on file   Years of education: Not on file   Highest education level: Not on file  Occupational History   Not on file  Tobacco Use   Smoking status: Former    Packs/day: 0.50    Years: 32.00    Pack years: 16.00    Types: Cigarettes    Quit date: 05/19/2015    Years since quitting: 5.8   Smokeless tobacco: Never  Vaping Use   Vaping Use: Never used  Substance and Sexual Activity   Alcohol use: No   Drug use: No   Sexual activity: Not on file  Other Topics Concern   Not on file  Social History Narrative   Not on file   Social Determinants of Health   Financial Resource Strain: Not on file  Food Insecurity: Not on file  Transportation Needs: Not on file  Physical Activity: Not on file  Stress: Not on file  Social Connections: Not on file    Allergies:  No Known Allergies  Objective:    Vital Signs:   Temp:  [97.9 F (36.6 C)-99.8 F (37.7 C)] 99.6 F (37.6 C) (12/14 1127) Pulse Rate:  [99-124] 120 (12/14 1200) Resp:  [11-25] 18 (12/14 1200) BP: (89-121)/(66-90) 96/73 (12/14 1200) SpO2:  [100 %] 100 % (12/14 1200) FiO2 (%):  [40 %-100 %] 40 % (12/14 1120) Weight:  [128.7 kg] 128.7 kg (12/14 0418) Last BM Date: 03/29/21  Weight change: Filed Weights   03/29/21 0446 03/30/21 0418  Weight: 130.6 kg 128.7 kg    Intake/Output:   Intake/Output Summary (Last 24 hours) at 03/30/2021 1407 Last data filed at 03/30/2021 1244 Gross per 24 hour  Intake 1404.41 ml  Output 4600 ml  Net -3195.59 ml      Physical Exam    CVP 10-11  General:  critically ill, intubated and sedated  HEENT: normal + ETT  Neck: supple. JVP elevated to jaw. Carotids 2+ bilat; no bruits. No lymphadenopathy or thyromegaly appreciated. Cor: PMI nondisplaced. Regular rate & rhythm. No rubs, gallops or  murmurs. Lungs: intubated and clear, 2/6 MR murmur  Abdomen: soft, nontender, nondistended. No hepatosplenomegaly. No bruits or masses. Good bowel sounds. Extremities: no cyanosis, clubbing, rash, 1+ bilateral pedal/ankle edema, bilateral SCDs  Neuro: intubated and sedated    Telemetry   Sinus  tach 110s   EKG    Sinus tach 117 bpm, slight inferolateral ST depressions  Labs   Basic Metabolic Panel: Recent Labs  Lab 03/29/21 0501 03/29/21 1219 03/30/21 0408  NA 136 141 137  K 4.1 3.1* 3.4*  CL 101  --  102  CO2 26  --  27  GLUCOSE 154*  --  116*  BUN 20  --  17  CREATININE 1.59*  --  1.31*  CALCIUM 9.0  --  8.6*  MG  --   --  2.0  PHOS  --   --  3.6    Liver Function Tests: Recent Labs  Lab 03/30/21 0408  AST 14*  ALT 14  ALKPHOS 40  BILITOT 2.4*  PROT 7.0  ALBUMIN 2.9*   No results for input(s): LIPASE, AMYLASE in the last 168 hours. No results for input(s): AMMONIA in the last 168 hours.  CBC: Recent Labs  Lab 03/29/21 0501 03/29/21 1219 03/30/21 0408  WBC 8.5  --  7.3  NEUTROABS 5.2  --   --   HGB 12.8* 13.6 12.1*  HCT 43.0 40.0 40.4  MCV 83.7  --  82.8  PLT 260  --  254    Cardiac Enzymes: No results for input(s): CKTOTAL, CKMB, CKMBINDEX, TROPONINI in the last 168 hours.  BNP: BNP (last 3 results) Recent Labs    03/29/21 0502  BNP 391.6*    ProBNP (last 3 results) No results for input(s): PROBNP in the last 8760 hours.   CBG: Recent Labs  Lab 03/29/21 1936 03/29/21 2333 03/30/21 0347 03/30/21 0728 03/30/21 1125  GLUCAP 134* 100* 109* 121* 107*    Coagulation Studies: No results for input(s): LABPROT, INR in the last 72 hours.   Imaging   DG Abd Portable 1V  Result Date: 03/30/2021 CLINICAL DATA:  NG tube placement. EXAM: PORTABLE ABDOMEN - 1 VIEW COMPARISON:  Earlier film, same date. FINDINGS: The NG tube remains in stable position with its tip in the body region of the stomach. The proximal port is just below the GE  junction. Stable air distended colon. IMPRESSION: NG tube in good position with the tip in the body region of the stomach. Electronically Signed   By: Marijo Sanes M.D.   On: 03/30/2021 13:28   DG Abd Portable 1V  Result Date: 03/30/2021 CLINICAL DATA:  Orogastric tube placement EXAM: PORTABLE ABDOMEN - 1 VIEW COMPARISON:  Yesterday FINDINGS: The enteric tube tip is at the left upper quadrant and presumably in the stomach. Side port is in the region of the GE junction, unchanged. Streaky density at the left more than right base. No gas dilated bowel. IMPRESSION: Unchanged positioning of the enteric tube with tip at the stomach and side port near the GE junction. Electronically Signed   By: Jorje Guild M.D.   On: 03/30/2021 05:39   DG Abd Portable 1V  Result Date: 03/29/2021 CLINICAL DATA:  NG tube placement EXAM: PORTABLE ABDOMEN - 1 VIEW COMPARISON:  03/29/2021 FINDINGS: Esophageal tube has been slightly advanced, the side port projects over distal esophageal region, tip overlies the mid stomach. Extensive surgical clips in the upper abdomen. IMPRESSION: Esophageal tube tip overlies the stomach, the side port is in the region of the GE junction, consider further advancement for more optimal positioning. Electronically Signed   By: Donavan Foil M.D.   On: 03/29/2021 20:30   DG Abd Portable 1V  Result Date: 03/29/2021 CLINICAL DATA:  Orogastric tube placement. EXAM: PORTABLE ABDOMEN -  1 VIEW COMPARISON:  Radiographs 03/29/2021.  CT 12/17/2019. FINDINGS: 1410 hours. Single-view centered on the upper abdomen. Enteric tube position is similar to prior radiograph, with tip near the gastroesophageal junction and side hole overlying the distal esophagus. The visualized bowel gas pattern is nonobstructive. Multiple surgical clips are present within the upper abdomen. There are patchy left greater than right basilar airspace opacities and a probable small left pleural effusion. Multiple telemetry leads  overlie the chest and upper abdomen. IMPRESSION: Unchanged position of the enteric tube from earlier radiograph, tip near the expected gastroesophageal junction. Electronically Signed   By: Richardean Sale M.D.   On: 03/29/2021 14:21     Medications:     Current Medications:  chlorhexidine gluconate (MEDLINE KIT)  15 mL Mouth Rinse BID   Chlorhexidine Gluconate Cloth  6 each Topical Daily   feeding supplement (PROSource TF)  45 mL Per Tube QID   feeding supplement (VITAL HIGH PROTEIN)  1,000 mL Per Tube Q24H   furosemide  80 mg Intravenous BID   gabapentin  600 mg Per Tube BID WC   gabapentin  900 mg Per Tube QHS   insulin aspart  0-20 Units Subcutaneous Q4H   mouth rinse  15 mL Mouth Rinse 10 times per day   pantoprazole (PROTONIX) IV  40 mg Intravenous Q24H   rivaroxaban  20 mg Per Tube Q supper   sodium chloride flush  10-40 mL Intracatheter Q12H    Infusions:  fentaNYL infusion INTRAVENOUS 100 mcg/hr (03/30/21 0813)   norepinephrine (LEVOPHED) Adult infusion 6 mcg/min (03/30/21 0813)   potassium chloride 10 mEq (03/30/21 1334)   propofol (DIPRIVAN) infusion 40 mcg/kg/min (03/30/21 1355)     Assessment/Plan   1. Acute Systolic Heart Failure (New)  - Admitted w/ acute pulmonary edema/ hypoxic respiratory failure  - Echo LVEF 25-30%, diffuse HK, RV mildly reduced. No prior study for comparison - Etiology uncertain. H/o chemotherapy w/ Fluoropyrimidines (potentially cardiotoxic) but this was remotely. Off chemo since 2003 - He has multiple risk factors for CAD. HS trop not c/w ACS but will need R/LHC - cMRI if cath unrevealing  - Co-ox ok, 84% - Continue NE for BP support, currently at 2 mcg. Wean as tolerated. Can add midodrine to help facilitate wean  - GDMT once BP stable  - remains fluid overloaded, CVP 10-11. Continue IV Lasix 80 mg bid  - may need digoxin vs corlanor for rate control (TSH pending)   2. Acute Hypoxic Respiratory Failure - 2/2 acute pulmonary edema -  intubated, vent management per PCCM  - diuresis per above  3. Mitral Regurgitation  - Mod-severe on echo - Suspect functional in setting of severely dilated LV  - Repeat echo after diuresis, if still mod-severe will need TEE   4. AKI on CKD Stage II - Baseline SCr ~1.2  - SCr 1.6>>1.3  - monitor w/ diuresis  - Avoid hypotension. NE for now. May need midodrine   5. Hypokalemia - K 3.4 - Supp w/ KCL - monitor w/ diuresis   6. Type 2DM  - Hgb A1c 7.7  - SGLT2i initiation prior to d/c     Length of Stay: 1  Brittainy Simmons, PA-C  03/30/2021, 2:07 PM  Advanced Heart Failure Team Pager [phone] (M-F; 7a - 5p)  Please contact [facility] for night-coverage after hours (4p -7a ) and weekends on [email or web address]  Patient seen and examined with the above-signed Advanced Practice Provider and/or Housestaff. I personally reviewed laboratory data, imaging studies  and relevant notes. I independently examined the patient and formulated the important aspects of the plan. I have edited the note to reflect any of my changes or salient points. I have personally discussed the plan with the patient and/or family.  60 y/o male as above admitted with acute systolic HF with hypotension and respiratory failure.   Remains intubated on  low dose NE   Echo EF 20-25%  LVIDd 7.1 cm with mod MR. RV mild to moderately down.   Co-ox 75%. WBC normal. Afebrile.   General:Intubated sedated  HEENT: normal Neck: supple. JVP 10  Carotids 2+ bilat; no bruits. No lymphadenopathy or thryomegaly appreciated. Cor: PMI nondisplaced. Regular tachy + s3 No rubs, gallops or murmurs. Lungs: clear Abdomen: obese soft, nontender, nondistended. No hepatosplenomegaly. No bruits or masses. Good bowel sounds. Extremities: no cyanosis, clubbing, rash, 2+ edema Neuro: intubated sedated   Given echo suspect EF has been down for some time. Picture c/w decompensated HF but co-ox doesn't make sense. Will repeat ? Shunt.    Will continue to support with NE. Add dig. Will need R/L cath soon. Doubt MR is primary issue. Likely secondary to dilated LV.   Check PCT, lactic acid and co-ox now.   CRITICAL CARE Performed by: Glori Bickers  Total critical care time: 45 minutes  Critical care time was exclusive of separately billable procedures and treating other patients.  Critical care was necessary to treat or prevent imminent or life-threatening deterioration.  Critical care was time spent personally by me (independent of midlevel providers or residents) on the following activities: development of treatment plan with patient and/or surrogate as well as nursing, discussions with consultants, evaluation of patient's response to treatment, examination of patient, obtaining history from patient or surrogate, ordering and performing treatments and interventions, ordering and review of laboratory studies, ordering and review of radiographic studies, pulse oximetry and re-evaluation of patient's condition.  Glori Bickers, MD  5:32 PM

## 2021-03-31 ENCOUNTER — Inpatient Hospital Stay (HOSPITAL_COMMUNITY): Payer: Medicare HMO

## 2021-03-31 LAB — BASIC METABOLIC PANEL
Anion gap: 6 (ref 5–15)
BUN: 21 mg/dL (ref 8–23)
CO2: 30 mmol/L (ref 22–32)
Calcium: 8.6 mg/dL — ABNORMAL LOW (ref 8.9–10.3)
Chloride: 102 mmol/L (ref 98–111)
Creatinine, Ser: 1.21 mg/dL (ref 0.61–1.24)
GFR, Estimated: >60 mL/min (ref >60)
Glucose, Bld: 200 mg/dL — ABNORMAL HIGH (ref 70–99)
Potassium: 3.6 mmol/L (ref 3.5–5.1)
Sodium: 138 mmol/L (ref 135–145)

## 2021-03-31 LAB — CBC
HCT: 40.2 % (ref 39.0–52.0)
Hemoglobin: 12.1 g/dL — ABNORMAL LOW (ref 13.0–17.0)
MCH: 25.2 pg — ABNORMAL LOW (ref 26.0–34.0)
MCHC: 30.1 g/dL (ref 30.0–36.0)
MCV: 83.6 fL (ref 80.0–100.0)
Platelets: 243 10*3/uL (ref 150–400)
RBC: 4.81 MIL/uL (ref 4.22–5.81)
RDW: 15.8 % — ABNORMAL HIGH (ref 11.5–15.5)
WBC: 8.3 10*3/uL (ref 4.0–10.5)
nRBC: 0 % (ref 0.0–0.2)

## 2021-03-31 LAB — URINALYSIS, ROUTINE W REFLEX MICROSCOPIC
Bilirubin Urine: NEGATIVE
Glucose, UA: NEGATIVE mg/dL
Hgb urine dipstick: NEGATIVE
Ketones, ur: NEGATIVE mg/dL
Leukocytes,Ua: NEGATIVE
Nitrite: NEGATIVE
Protein, ur: NEGATIVE mg/dL
Specific Gravity, Urine: 1.02 (ref 1.005–1.030)
pH: 6 (ref 5.0–8.0)

## 2021-03-31 LAB — PHOSPHORUS: Phosphorus: 4.5 mg/dL (ref 2.5–4.6)

## 2021-03-31 LAB — TSH: TSH: 0.599 u[IU]/mL (ref 0.350–4.500)

## 2021-03-31 LAB — COOXEMETRY PANEL
Carboxyhemoglobin: 1.4 % (ref 0.5–1.5)
Methemoglobin: 0.7 % (ref 0.0–1.5)
O2 Saturation: 68.3 %
Total hemoglobin: 12.3 g/dL (ref 12.0–16.0)

## 2021-03-31 LAB — POCT I-STAT 7, (LYTES, BLD GAS, ICA,H+H)
Acid-Base Excess: 7 mmol/L — ABNORMAL HIGH (ref 0.0–2.0)
Bicarbonate: 32.4 mmol/L — ABNORMAL HIGH (ref 20.0–28.0)
Calcium, Ion: 1.17 mmol/L (ref 1.15–1.40)
HCT: 38 % — ABNORMAL LOW (ref 39.0–52.0)
Hemoglobin: 12.9 g/dL — ABNORMAL LOW (ref 13.0–17.0)
O2 Saturation: 97 %
Patient temperature: 99.4
Potassium: 3.5 mmol/L (ref 3.5–5.1)
Sodium: 138 mmol/L (ref 135–145)
TCO2: 34 mmol/L — ABNORMAL HIGH (ref 22–32)
pCO2 arterial: 50.1 mmHg — ABNORMAL HIGH (ref 32.0–48.0)
pH, Arterial: 7.421 (ref 7.350–7.450)
pO2, Arterial: 98 mmHg (ref 83.0–108.0)

## 2021-03-31 LAB — GLUCOSE, CAPILLARY
Glucose-Capillary: 187 mg/dL — ABNORMAL HIGH (ref 70–99)
Glucose-Capillary: 185 mg/dL — ABNORMAL HIGH (ref 70–99)
Glucose-Capillary: 141 mg/dL — ABNORMAL HIGH (ref 70–99)
Glucose-Capillary: 190 mg/dL — ABNORMAL HIGH (ref 70–99)
Glucose-Capillary: 186 mg/dL — ABNORMAL HIGH (ref 70–99)
Glucose-Capillary: 184 mg/dL — ABNORMAL HIGH (ref 70–99)

## 2021-03-31 LAB — MAGNESIUM: Magnesium: 2 mg/dL (ref 1.7–2.4)

## 2021-03-31 LAB — PROCALCITONIN: Procalcitonin: 0.32 ng/mL

## 2021-03-31 MED ORDER — HEPARIN (PORCINE) 25000 UT/250ML-% IV SOLN
1850.0000 [IU]/h | INTRAVENOUS | Status: DC
  Administered 2021-03-31: 17:00:00 1400 [IU]/h via INTRAVENOUS
  Administered 2021-04-01: 1750 [IU]/h via INTRAVENOUS
  Filled 2021-03-31 (×2): qty 250

## 2021-03-31 MED ORDER — AMIODARONE HCL IN DEXTROSE 360-4.14 MG/200ML-% IV SOLN
60.0000 mg/h | INTRAVENOUS | Status: AC
  Administered 2021-03-31 (×2): 60 mg/h via INTRAVENOUS
  Filled 2021-03-31 (×2): qty 200

## 2021-03-31 MED ORDER — AMIODARONE HCL IN DEXTROSE 360-4.14 MG/200ML-% IV SOLN
30.0000 mg/h | INTRAVENOUS | Status: AC
  Administered 2021-03-31 – 2021-04-02 (×4): 30 mg/h via INTRAVENOUS
  Filled 2021-03-31 (×4): qty 200

## 2021-03-31 MED ORDER — PANTOPRAZOLE 2 MG/ML SUSPENSION
40.0000 mg | Freq: Every day | ORAL | Status: DC
  Administered 2021-04-01 – 2021-04-05 (×5): 40 mg
  Filled 2021-03-31 (×5): qty 20

## 2021-03-31 MED ORDER — AMIODARONE IV BOLUS ONLY 150 MG/100ML
150.0000 mg | Freq: Once | INTRAVENOUS | Status: AC
  Administered 2021-03-31: 150 mg via INTRAVENOUS

## 2021-03-31 MED ORDER — POTASSIUM CHLORIDE 20 MEQ PO PACK
20.0000 meq | PACK | Freq: Once | ORAL | Status: AC
  Administered 2021-03-31: 20 meq
  Filled 2021-03-31: qty 1

## 2021-03-31 MED ORDER — FUROSEMIDE 10 MG/ML IJ SOLN
40.0000 mg | Freq: Two times a day (BID) | INTRAMUSCULAR | Status: DC
  Administered 2021-03-31 – 2021-04-02 (×4): 40 mg via INTRAVENOUS
  Filled 2021-03-31 (×4): qty 4

## 2021-03-31 MED ORDER — AMIODARONE LOAD VIA INFUSION
150.0000 mg | Freq: Once | INTRAVENOUS | Status: AC
  Administered 2021-03-31: 150 mg via INTRAVENOUS
  Filled 2021-03-31: qty 83.34

## 2021-03-31 MED ORDER — ACETAMINOPHEN 160 MG/5ML PO SOLN
650.0000 mg | ORAL | Status: AC | PRN
  Administered 2021-03-31 (×2): 650 mg
  Filled 2021-03-31 (×2): qty 20.3

## 2021-03-31 MED ORDER — METOPROLOL TARTRATE 5 MG/5ML IV SOLN
5.0000 mg | Freq: Four times a day (QID) | INTRAVENOUS | Status: DC | PRN
  Administered 2021-03-31: 5 mg via INTRAVENOUS
  Filled 2021-03-31: qty 5

## 2021-03-31 NOTE — H&P (View-Only) (Signed)
Advanced Heart Failure Rounding Note  PCP-Cardiologist: Dr. Smith/Dr. Eleftherios Dudenhoeffer    Subjective:    Remains intubated and sedated. FiO2 40%.  On NE 6. MAPs upper 70s.   Co-ox 68%  Febrile overnight, Temp 100.9. WBC 8.3 . PCT 0.32. UA, respiratory and blood cx pending. Not on abx yet.     Persistent tachycardia, V-rates increased this am ~8.30, now sustaining in the 140s-150s  On dig 0.125   5L in UOP w/ IV Lasix yesterday. Wt down 14 lb. CVP 8. CXR still w/ pulmonary edema   SCr 1.31>>1.21 K 3.6  Mg 2.0   Objective:   Weight Range: 122.2 kg Body mass index is 35.54 kg/m.   Vital Signs:   Temp:  [99 F (37.2 C)-100.9 F (38.3 C)] 99 F (37.2 C) (12/15 0727) Pulse Rate:  [92-235] 144 (12/15 1003) Resp:  [16-22] 18 (12/15 1003) BP: (69-182)/(44-138) 89/78 (12/14 2215) SpO2:  [91 %-100 %] 100 % (12/15 1003) Arterial Line BP: (98-122)/(54-68) 116/65 (12/15 1003) FiO2 (%):  [40 %-50 %] 40 % (12/15 0755) Weight:  [122.2 kg] 122.2 kg (12/15 0500) Last BM Date: 03/29/21  Weight change: Filed Weights   03/29/21 0446 03/30/21 0418 03/31/21 0500  Weight: 130.6 kg 128.7 kg 122.2 kg    Intake/Output:   Intake/Output Summary (Last 24 hours) at 03/31/2021 1052 Last data filed at 03/31/2021 1000 Gross per 24 hour  Intake 2416.91 ml  Output 5290 ml  Net -2873.09 ml      Physical Exam    General:  critically ill, intubated and sedated, diaphoretic  HEENT: Normal + ETT  Neck: Supple. JVP 8 cm . Carotids 2+ bilat; no bruits. No lymphadenopathy or thyromegaly appreciated. Cor: PMI nondisplaced. Irregular rhythm, tachy rate. 2/6 MR murmur  Lungs: intubated and clear Clear Abdomen: Soft, nontender, nondistended. No hepatosplenomegaly. No bruits or masses. Good bowel sounds. Extremities: No cyanosis, clubbing, rash, trace bilateral  LE edema Neuro: intubated and sedated    Telemetry   Atrial Tach vs A Flutter 140s-150s   EKG    Atrial tach vs AFlutter 146  bpm  Labs    CBC Recent Labs    03/29/21 0501 03/29/21 1219 03/30/21 0408 03/30/21 2051 03/31/21 0327 03/31/21 0349  WBC 8.5  --  7.3  --   --  8.3  NEUTROABS 5.2  --   --   --   --   --   HGB 12.8*   < > 12.1*   < > 12.9* 12.1*  HCT 43.0   < > 40.4   < > 38.0* 40.2  MCV 83.7  --  82.8  --   --  83.6  PLT 260  --  254  --   --  243   < > = values in this interval not displayed.   Basic Metabolic Panel Recent Labs    03/30/21 0408 03/30/21 2051 03/31/21 0327 03/31/21 0349  NA 137   < > 138 138  K 3.4*   < > 3.5 3.6  CL 102  --   --  102  CO2 27  --   --  30  GLUCOSE 116*  --   --  200*  BUN 17  --   --  21  CREATININE 1.31*  --   --  1.21  CALCIUM 8.6*  --   --  8.6*  MG 2.0  --   --  2.0  PHOS 3.6  --   --  4.5   < > = values in this interval not displayed.   Liver Function Tests Recent Labs    03/30/21 0408  AST 14*  ALT 14  ALKPHOS 40  BILITOT 2.4*  PROT 7.0  ALBUMIN 2.9*   No results for input(s): LIPASE, AMYLASE in the last 72 hours. Cardiac Enzymes No results for input(s): CKTOTAL, CKMB, CKMBINDEX, TROPONINI in the last 72 hours.  BNP: BNP (last 3 results) Recent Labs    03/29/21 0502  BNP 391.6*    ProBNP (last 3 results) No results for input(s): PROBNP in the last 8760 hours.   D-Dimer No results for input(s): DDIMER in the last 72 hours. Hemoglobin A1C Recent Labs    03/29/21 1102  HGBA1C 7.7*   Fasting Lipid Panel Recent Labs    03/30/21 0408  TRIG 98   Thyroid Function Tests Recent Labs    03/31/21 0349  TSH 0.599    Other results:   Imaging    DG Chest Port 1 View  Result Date: 03/31/2021 CLINICAL DATA:  Respiratory failure EXAM: PORTABLE CHEST 1 VIEW COMPARISON:  Chest x-ray dated March 29, 2021 FINDINGS: Enteric tube tip projects over the midthoracic trachea. Left IJ line with tip at the junction of the brachycephalic vein and SVC. Interval removal of NG/OG tube and placement of feeding tube which courses  below the diaphragm. Unchanged enlarged cardiac and mediastinal contours. Lower lung predominant heterogeneous opacities, likely combination atelectasis, pulmonary edema and layering pleural effusions. No evidence of pneumothorax. IMPRESSION: 1. Interval removal of NG/OG tube and placement of feeding tube which courses below the diaphragm. 2. Similar lower lung predominant heterogeneous opacities. Electronically Signed   By: Yetta Glassman M.D.   On: 03/31/2021 08:49   DG Abd Portable 1V  Result Date: 03/30/2021 CLINICAL DATA:  Feeding tube placement. EXAM: PORTABLE ABDOMEN - 1 VIEW COMPARISON:  Same day. FINDINGS: Distal tip of feeding tube is seen in expected position of proximal stomach. IMPRESSION: Distal tip of feeding tube seen in expected position of proximal stomach. Electronically Signed   By: Marijo Conception M.D.   On: 03/30/2021 16:11     Medications:     Scheduled Medications:  chlorhexidine gluconate (MEDLINE KIT)  15 mL Mouth Rinse BID   Chlorhexidine Gluconate Cloth  6 each Topical Daily   digoxin  0.125 mg Per Tube Daily   feeding supplement (PROSource TF)  45 mL Per Tube QID   feeding supplement (VITAL HIGH PROTEIN)  1,000 mL Per Tube Q24H   furosemide  40 mg Intravenous BID   gabapentin  600 mg Per Tube BID WC   gabapentin  900 mg Per Tube QHS   insulin aspart  0-20 Units Subcutaneous Q4H   mouth rinse  15 mL Mouth Rinse 10 times per day   pantoprazole sodium  40 mg Per Tube Daily   rivaroxaban  20 mg Per Tube Q supper   sodium chloride flush  10-40 mL Intracatheter Q12H    Infusions:  sodium chloride     fentaNYL infusion INTRAVENOUS 100 mcg/hr (03/31/21 1000)   norepinephrine (LEVOPHED) Adult infusion 6 mcg/min (03/31/21 1000)   propofol (DIPRIVAN) infusion 40 mcg/kg/min (03/31/21 1000)    PRN Medications: Place/Maintain arterial line **AND** sodium chloride, acetaminophen (TYLENOL) oral liquid 160 mg/5 mL, cyclobenzaprine, docusate sodium, fentaNYL,  metoprolol tartrate, polyethylene glycol, sodium chloride flush   Assessment/Plan   1. Acute Systolic Heart Failure ([place])  - Admitted w/ acute pulmonary edema/ hypoxic respiratory failure  -  Echo LVEF 25-30%, diffuse HK, RV mildly reduced. No prior study for comparison - Etiology uncertain. H/o chemotherapy w/ Fluoropyrimidines (potentially cardiotoxic) but this was remotely. Off chemo since 2003 - He has multiple risk factors for CAD. HS trop not c/w ACS but will need R/LHC once more stable - cMRI if cath unrevealing  - Co-ox ok 68% today  - Continue NE for BP support, currently at 6 mcg. Wean as tolerated. Can add midodrine to help facilitate wean  - responding well to IV diuretics but remains fluid overloaded, CVP 8. F/u CXR today still shows pulmonary edema - Continue IV Lasix 40 mg bid  - Continue digoxin 0.125 for rate control  - No other GDMT yet given hypotension requiring NE    2. ? Developing Septic Shock  - Febrile, mTemp 100.9 - PCT 0.32 - persistent Hypotension requiring NE despite - Check UA, Respiratory and blood cultures - consider empiric abx   3. Acute Hypoxic Respiratory Failure - 2/2 acute pulmonary edema - intubated, vent management per PCCM  - diuresis per above   4. Mitral Regurgitation  - Mod-severe on echo - Suspect functional in setting of severely dilated LV  - Repeat echo after diuresis, if still mod-severe will need TEE    5. AKI on CKD Stage II - Baseline SCr ~1.2  - SCr 1.6>>1.3>1.2  - monitor w/ diuresis  - Avoid hypotension. NE for now.    6. Hypokalemia - K 3.6.  - Supp w/ KCL. Aim to keep > 4.0  - monitor w/ diuresis    7. Type 2DM  - Hgb A1c 7.7  - SGLT2i initiation prior to d/c   8. Atrial Tach vs AFlutter w/ RVR - HR sustaining 140s-150s - Start Amio, bolus 150 x 1 followed by gtt at 60/hr  - will discus initiation of a/c w/ MD  - Keep K > 4.0 and Mg > 2.0     Length of Stay: 2  Lyda Jester, PA-C  03/31/2021,  10:52 AM  Advanced Heart Failure Team Pager [phone] (M-F; 7a - 5p)  Please contact [facility] for night-coverage after hours (5p -7a ) and weekends on [email or web address]  Agree with above.   Remains intubated/sedated. On NE at 6. Co-ox 68%. Excellent diuresis with IV lasix  CVP now 8.  Has developed AFL with RVR. Remains febrile with mildly elevated PCT. On abx.   General:  Intubate/sedated HEENT: normal + ETT Neck: supple. JVP 8 Carotids 2+ bilat; no bruits. No lymphadenopathy or thryomegaly appreciated. Cor: PMI nondisplaced. Irreg tachy  No rubs, gallops or murmurs. Lungs: clear Abdomen: soft, nontender, nondistended. No hepatosplenomegaly. No bruits or masses. Good bowel sounds. Extremities: no cyanosis, clubbing, rash, edema Neuro: intubated/sedated  Remains very tenuous. Suspect combination septic/cardiogenic shock. Continue NE. Agree with IV amio fo AFL. Given need for cath would switch Xarelto to heparin.   Plan cath either tomorrow or Monday depending on clinical course.   CRITICAL CARE Performed by: Glori Bickers  Total critical care time: 35 minutes  Critical care time was exclusive of separately billable procedures and treating other patients.  Critical care was necessary to treat or prevent imminent or life-threatening deterioration.  Critical care was time spent personally by me (independent of midlevel providers or residents) on the following activities: development of treatment plan with patient and/or surrogate as well as nursing, discussions with consultants, evaluation of patient's response to treatment, examination of patient, obtaining history from patient or surrogate, ordering and performing treatments and interventions,  ordering and review of radiographic studies, pulse oximetry and re-evaluation of patient's condition. ° °Kaeleen Odom, MD  °3:23 PM ° ° °

## 2021-03-31 NOTE — Progress Notes (Signed)
ANTICOAGULATION CONSULT NOTE - Initial Consult  Pharmacy Consult for heparin  Indication: Hx DVT   No Known Allergies  Patient Measurements: Height: 6\' 1"  (185.4 cm) Weight: 122.2 kg (269 lb 6.4 oz) IBW/kg (Calculated) : 79.9 Heparin Dosing Weight: 109kg  Vital Signs: Temp: 98.9 F (37.2 C) (12/15 1522) Temp Source: Axillary (12/15 1522) BP: 108/70 (12/15 1507) Pulse Rate: 136 (12/15 1507)  Labs: Recent Labs    03/29/21 0501 03/29/21 0628 03/29/21 1219 03/30/21 0408 03/30/21 2051 03/31/21 0327 03/31/21 0349  HGB 12.8*  --    < > 12.1* 13.6 12.9* 12.1*  HCT 43.0  --    < > 40.4 40.0 38.0* 40.2  PLT 260  --   --  254  --   --  243  CREATININE 1.59*  --   --  1.31*  --   --  1.21  TROPONINIHS 44* 39*  --   --   --   --   --    < > = values in this interval not displayed.    Estimated Creatinine Clearance: 86.7 mL/min (by C-G formula based on SCr of 1.21 mg/dL).   Medical History: Past Medical History:  Diagnosis Date   Cancer [facility])    Colon cancer metastasized to liver ([place]) 01/05/2012   Colon carcinoma ([place])    colon ca dx 2001;   Diabetes mellitus without complication ([place])    Hypertension    Muscle weakness-general 01/05/2012      Assessment: 60yom Hx DVT on rivaroxaban pta  - admitted with SOB and LE edema being treated for heart failure exacerbation. Planning for cath this week or next - will hold rivaroxaban and bridge with heparin  Will dose heparin based off aptt for now and await rivaroxaban wash out ans aptt/heparin level correlation Follow up post cath to resume rivaroxaban   Goal of Therapy:  Aptt 66-103 sec  Heparin level 0.3-0.7 Monitor platelets by anticoagulation protocol: Yes   Plan:  Stop rivaroxaban  No bolus  Heparin drip 1400 uts/hr  Aptt 6hr after drip started  Daily aptt, CBC and heparin level  Monitor s/s bleeding   Bonnita Nasuti Pharm.D. CPP, BCPS Clinical Pharmacist [phone] 03/31/2021 3:30 PM

## 2021-03-31 NOTE — Progress Notes (Signed)
NAME:  Raymond Allen, MRN:  [id number], DOB:  15-Apr-1961, LOS: 2 ADMISSION DATE:  03/29/2021, CONSULTATION DATE:  03/29/21 REFERRING MD:  Dr. Jerrol Banana, CHIEF COMPLAINT:  Short of breath   History of Present Illness:  60 yo male former smoker presented with several weeks of dyspnea and edema.  This was associated with cough with clear sputum.  Found to have hypervolemia with pulmonary edema.  Failed Bipap and required intubation.  Pertinent  Medical History  DM type 2, DVT on xarelto, HTN, Colon cancer s/p partial colectomy 2001 with liver mets s/p Lt hepatectomy, CKD 3a, ED  Significant Hospital Events: Including procedures, antibiotic start and stop dates in addition to other pertinent events   12/13 Admit, cardiology consulted, intubated 12/15 febrile  Studies:  Echo 03/29/21 >> EF 25 to 30%, RVSP 38 mmHg, mod LA dilation, mod/severe MR  Interim History / Subjective:  Remains on pressors, sedation.  Fever overnight (Tm 101F).    Objective   Blood pressure (!) 89/78, pulse 96, temperature 99 F (37.2 C), temperature source Axillary, resp. rate 18, height 6\' 1"  (1.854 m), weight 122.2 kg, SpO2 99 %. CVP:  [3 mmHg-18 mmHg] 10 mmHg  Vent Mode: PRVC FiO2 (%):  [40 %-50 %] 40 % Set Rate:  [18 bmp] 18 bmp Vt Set:  [630 mL] 630 mL PEEP:  [5 cmH20-8 cmH20] 5 cmH20 Plateau Pressure:  [20 cmH20-26 cmH20] 20 cmH20   Intake/Output Summary (Last 24 hours) at 03/31/2021 0844 Last data filed at 03/31/2021 0800 Gross per 24 hour  Intake 2284.04 ml  Output 4880 ml  Net -2595.96 ml   Filed Weights   03/29/21 0446 03/30/21 0418 03/31/21 0500  Weight: 130.6 kg 128.7 kg 122.2 kg    Examination:  General - sedated, diaphoretic Eyes - pupils reactive ENT - ETT in place Cardiac - regular, tachycardic Chest - b/l rhonchi Abdomen - soft, non tender, + bowel sounds Extremities - 1+ edema Skin - no rashes Neuro - RASS -2  Resolved Hospital Problem list     Assessment & Plan:   Acute  hypoxic respiratory failure from acute pulmonary edema. - full vent support for now - f/u CXR - change lasix to 40 mg bid  Acute systolic CHF. Mitral regurgitation likely functional in setting of dilated cardiomyopathy. Cardiogenic shock. - weaning off pressors 12/15; goal MAP > 65 - prn lopressor for HR > 125 - heart failure team following - will need LHC/RHC at some point - hold outpt norvasc, HCTZ, lisinopril, crestor  Fever. - check sputum, blood culture, U/A - check procalcitonin - prn tylenol - defer ABx for now  Hx of DVT. - continue xarelto  Hypokalemia. CKD 3a. - f/u BMET  Acute metabolic encephalopathy from hypoxic respiratory failure. - RASS goal 0 to -1  DM type 2 with neuropathy. - SSI - hold outpt metformin, ozempic - continue neurontin  Best Practice (right click and "Reselect all SmartList Selections" daily)   Diet/type: tubefeeds DVT prophylaxis: DOAC GI prophylaxis: PPI Lines: Central line Foley:  N/A Code Status:  full code Last date of multidisciplinary goals of care discussion [x]   Labs    CMP Latest Ref Rng & Units 03/31/2021 03/31/2021 03/30/2021  Glucose 70 - 99 mg/dL 200(H) - -  BUN 8 - 23 mg/dL 21 - -  Creatinine 0.61 - 1.24 mg/dL 1.21 - -  Sodium 135 - 145 mmol/L 138 138 139  Potassium 3.5 - 5.1 mmol/L 3.6 3.5 3.4(L)  Chloride 98 - 111  mmol/L 102 - -  CO2 22 - 32 mmol/L 30 - -  Calcium 8.9 - 10.3 mg/dL 8.6(L) - -  Total Protein 6.5 - 8.1 g/dL - - -  Total Bilirubin 0.3 - 1.2 mg/dL - - -  Alkaline Phos 38 - 126 U/L - - -  AST 15 - 41 U/L - - -  ALT 0 - 44 U/L - - -    CBC Latest Ref Rng & Units 03/31/2021 03/31/2021 03/30/2021  WBC 4.0 - 10.5 K/uL 8.3 - -  Hemoglobin 13.0 - 17.0 g/dL 12.1(L) 12.9(L) 13.6  Hematocrit 39.0 - 52.0 % 40.2 38.0(L) 40.0  Platelets 150 - 400 K/uL 243 - -    ABG    Component Value Date/Time   PHART 7.421 03/31/2021 0327   PCO2ART 50.1 (H) 03/31/2021 0327   PO2ART 98 03/31/2021 0327   HCO3  32.4 (H) 03/31/2021 0327   TCO2 34 (H) 03/31/2021 0327   ACIDBASEDEF 7.0 (H) 11/29/2007 0320   O2SAT 97.0 03/31/2021 0327    CBG (last 3)  Recent Labs    03/30/21 2336 03/31/21 0338 03/31/21 0724  GLUCAP 189* 187* 185*    Critical care time: 32 minutes  Chesley Mires, MD [place] Pager - [phone] 03/31/2021, 8:44 AM

## 2021-03-31 NOTE — Progress Notes (Signed)
°   03/31/21 0755  Airway 8 mm  Placement Date/Time: 03/29/21 1025   Grade View: Grade 1  Placed By: ICU physician  Airway Device: Endotracheal Tube  Laryngoscope Blade: MAC;4  ETT Types: Oral  Size (mm): 8 mm  Cuffed: Cuffed  Insertion attempts: 1  Airway Equipment: Stylet  Placeme...  Secured at (cm) 24 cm  Measured From Lips  Secured Location Right  Secured By Actuary Repositioned Yes  Prone position No  Cuff Pressure (cm H2O) Clear OR 27-39 CmH2O  Site Condition Dry  Adult Ventilator Settings  Vent Type Servo i  Humidity HME  Vent Mode PRVC  Vt Set 630 mL  Set Rate 18 bmp  FiO2 (%) 40 %  I Time 0.8 Sec(s)  PEEP 5 cmH20  Adult Ventilator Measurements  Peak Airway Pressure 24 L/min  Mean Airway Pressure 9 cmH20  Plateau Pressure 20 cmH20  Resp Rate Spontaneous 0 br/min  Resp Rate Total 18 br/min  Exhaled Vt 651 mL  Measured Ve 11.5 mL  I:E Ratio Measured 1:3.1  Auto PEEP 0 cmH20  Total PEEP 5 cmH20  SpO2 99 %  Adult Ventilator Alarms  Alarms On Y  Ve High Alarm 20 L/min  Ve Low Alarm 5 L/min  Resp Rate High Alarm 36 br/min  Resp Rate Low Alarm 15  PEEP Low Alarm 3 cmH2O  Press High Alarm 45 cmH2O  Press Low Alarm 5 cmH2O  VAP Prevention  HME changed No  Ventilator changed No  Transported while on vent No  HOB> 30 Degrees Y  Equipment wiped down Yes  Daily Weaning Assessment  Daily Assessment of Readiness to Wean Wean protocol criteria not met  Reason not met Apnea  Breath Sounds  Bilateral Breath Sounds Diminished  Airway Suctioning/Secretions  Suction Type ETT  Suction Device  Catheter  Secretion Amount Small  Secretion Color Tan  Secretion Consistency Thick  Suction Tolerance Tolerated well  Suctioning Adverse Effects None

## 2021-03-31 NOTE — Progress Notes (Signed)
Updated pt's wife at bedside.  Updated about current status and treatment plan.  She informed me that her husband a bad respiratory infection about 1 month prior to admission.  She reports he did outpt COVID testing that was negative, but no testing for other viral agents.  This raises the specter that he could have a viral induced cardiomyopathy.  Chesley Mires, MD [place] Pager - [phone] 03/31/2021, 4:24 PM

## 2021-03-31 NOTE — TOC Progression Note (Signed)
Transition of Care [facility]) - Initial/Assessment Note    Patient Details  Name: Raymond Allen MRN: [id number] Date of Birth: 04-23-1960  Transition of Care [facility]) CM/SW Contact:    Milinda Antis, [place] Phone Number: 03/31/2021, 4:34 PM  Clinical Narrative:                  Transition of Care Department [facility]) has reviewed patient and no TOC needs have been identified at this time.   The patient is currently on the vent.  We will continue to monitor patient advancement through interdisciplinary progression rounds. If new patient transition needs arise, please place a TOC consult.   -Lind Covert, LCSWA     Patient Goals and CMS Choice        Expected Discharge Plan and Services                                                Prior Living Arrangements/Services                       Activities of Daily Living      Permission Sought/Granted                  Emotional Assessment              Admission diagnosis:  Shortness of breath [R06.02] Respiratory distress [R06.03] Encounter for imaging study to confirm orogastric (OG) tube placement [Z01.89] Acute on chronic congestive heart failure, unspecified heart failure type [facility]) [I50.9] Patient Active Problem List   Diagnosis Date Noted   Respiratory distress 03/29/2021   Acute on chronic congestive heart failure ([place])    Type 2 diabetes mellitus without complication ([place])    Incisional hernia 11/27/2017   Colon cancer metastasized to liver ([place]) 01/05/2012   FLANK PAIN, RIGHT 01/21/2008   TOBACCO ABUSE 12/26/2007   HYPERGLYCEMIA 12/26/2007   PERS HX NONCOMPLIANCE W/MED TX PRS HAZARDS HLTH 12/26/2007   CARCINOMA, COLON 12/17/2007   HYPERTENSION 12/17/2007   PCP:  [place], Julie M, NP Pharmacy:   [id number] - [place], [place] [address] [place] [address] Phone: [phone] Fax:  [phone]     Social Determinants of Health (SDOH) Interventions    Readmission Risk Interventions No flowsheet data found.

## 2021-03-31 NOTE — Progress Notes (Signed)
[place] Progress Note Patient Name: CAROLE DEERE DOB: 13-Feb-1961 MRN: [id number]   Date of Service  03/31/2021  HPI/Events of Note  ABG has resulted, patient has low grade fever.  eICU Interventions  ABG result reviewed, PRN Tylenol ordered for fever.        Kerry Kass [place] 03/31/2021, 4:47 AM

## 2021-03-31 NOTE — Progress Notes (Signed)
Advanced Heart Failure Rounding Note  PCP-Cardiologist: Dr. Smith/Dr. Aislee Landgren    Subjective:    Remains intubated and sedated. FiO2 40%.  On NE 6. MAPs upper 70s.   Co-ox 68%  Febrile overnight, Temp 100.9. WBC 8.3 . PCT 0.32. UA, respiratory and blood cx pending. Not on abx yet.     Persistent tachycardia, V-rates increased this am ~8.30, now sustaining in the 140s-150s  On dig 0.125   5L in UOP w/ IV Lasix yesterday. Wt down 14 lb. CVP 8. CXR still w/ pulmonary edema   SCr 1.31>>1.21 K 3.6  Mg 2.0   Objective:   Weight Range: 122.2 kg Body mass index is 35.54 kg/m.   Vital Signs:   Temp:  [99 F (37.2 C)-100.9 F (38.3 C)] 99 F (37.2 C) (12/15 0727) Pulse Rate:  [92-235] 144 (12/15 1003) Resp:  [16-22] 18 (12/15 1003) BP: (69-182)/(44-138) 89/78 (12/14 2215) SpO2:  [91 %-100 %] 100 % (12/15 1003) Arterial Line BP: (98-122)/(54-68) 116/65 (12/15 1003) FiO2 (%):  [40 %-50 %] 40 % (12/15 0755) Weight:  [122.2 kg] 122.2 kg (12/15 0500) Last BM Date: 03/29/21  Weight change: Filed Weights   03/29/21 0446 03/30/21 0418 03/31/21 0500  Weight: 130.6 kg 128.7 kg 122.2 kg    Intake/Output:   Intake/Output Summary (Last 24 hours) at 03/31/2021 1052 Last data filed at 03/31/2021 1000 Gross per 24 hour  Intake 2416.91 ml  Output 5290 ml  Net -2873.09 ml      Physical Exam    General:  critically ill, intubated and sedated, diaphoretic  HEENT: Normal + ETT  Neck: Supple. JVP 8 cm . Carotids 2+ bilat; no bruits. No lymphadenopathy or thyromegaly appreciated. Cor: PMI nondisplaced. Irregular rhythm, tachy rate. 2/6 MR murmur  Lungs: intubated and clear Clear Abdomen: Soft, nontender, nondistended. No hepatosplenomegaly. No bruits or masses. Good bowel sounds. Extremities: No cyanosis, clubbing, rash, trace bilateral  LE edema Neuro: intubated and sedated    Telemetry   Atrial Tach vs A Flutter 140s-150s   EKG    Atrial tach vs AFlutter 146  bpm  Labs    CBC Recent Labs    03/29/21 0501 03/29/21 1219 03/30/21 0408 03/30/21 2051 03/31/21 0327 03/31/21 0349  WBC 8.5  --  7.3  --   --  8.3  NEUTROABS 5.2  --   --   --   --   --   HGB 12.8*   < > 12.1*   < > 12.9* 12.1*  HCT 43.0   < > 40.4   < > 38.0* 40.2  MCV 83.7  --  82.8  --   --  83.6  PLT 260  --  254  --   --  243   < > = values in this interval not displayed.   Basic Metabolic Panel Recent Labs    03/30/21 0408 03/30/21 2051 03/31/21 0327 03/31/21 0349  NA 137   < > 138 138  K 3.4*   < > 3.5 3.6  CL 102  --   --  102  CO2 27  --   --  30  GLUCOSE 116*  --   --  200*  BUN 17  --   --  21  CREATININE 1.31*  --   --  1.21  CALCIUM 8.6*  --   --  8.6*  MG 2.0  --   --  2.0  PHOS 3.6  --   --  4.5   < > = values in this interval not displayed.   Liver Function Tests Recent Labs    03/30/21 0408  AST 14*  ALT 14  ALKPHOS 40  BILITOT 2.4*  PROT 7.0  ALBUMIN 2.9*   No results for input(s): LIPASE, AMYLASE in the last 72 hours. Cardiac Enzymes No results for input(s): CKTOTAL, CKMB, CKMBINDEX, TROPONINI in the last 72 hours.  BNP: BNP (last 3 results) Recent Labs    03/29/21 0502  BNP 391.6*    ProBNP (last 3 results) No results for input(s): PROBNP in the last 8760 hours.   D-Dimer No results for input(s): DDIMER in the last 72 hours. Hemoglobin A1C Recent Labs    03/29/21 1102  HGBA1C 7.7*   Fasting Lipid Panel Recent Labs    03/30/21 0408  TRIG 98   Thyroid Function Tests Recent Labs    03/31/21 0349  TSH 0.599    Other results:   Imaging    DG Chest Port 1 View  Result Date: 03/31/2021 CLINICAL DATA:  Respiratory failure EXAM: PORTABLE CHEST 1 VIEW COMPARISON:  Chest x-ray dated March 29, 2021 FINDINGS: Enteric tube tip projects over the midthoracic trachea. Left IJ line with tip at the junction of the brachycephalic vein and SVC. Interval removal of NG/OG tube and placement of feeding tube which courses  below the diaphragm. Unchanged enlarged cardiac and mediastinal contours. Lower lung predominant heterogeneous opacities, likely combination atelectasis, pulmonary edema and layering pleural effusions. No evidence of pneumothorax. IMPRESSION: 1. Interval removal of NG/OG tube and placement of feeding tube which courses below the diaphragm. 2. Similar lower lung predominant heterogeneous opacities. Electronically Signed   By: Yetta Glassman M.D.   On: 03/31/2021 08:49   DG Abd Portable 1V  Result Date: 03/30/2021 CLINICAL DATA:  Feeding tube placement. EXAM: PORTABLE ABDOMEN - 1 VIEW COMPARISON:  Same day. FINDINGS: Distal tip of feeding tube is seen in expected position of proximal stomach. IMPRESSION: Distal tip of feeding tube seen in expected position of proximal stomach. Electronically Signed   By: Marijo Conception M.D.   On: 03/30/2021 16:11     Medications:     Scheduled Medications:  chlorhexidine gluconate (MEDLINE KIT)  15 mL Mouth Rinse BID   Chlorhexidine Gluconate Cloth  6 each Topical Daily   digoxin  0.125 mg Per Tube Daily   feeding supplement (PROSource TF)  45 mL Per Tube QID   feeding supplement (VITAL HIGH PROTEIN)  1,000 mL Per Tube Q24H   furosemide  40 mg Intravenous BID   gabapentin  600 mg Per Tube BID WC   gabapentin  900 mg Per Tube QHS   insulin aspart  0-20 Units Subcutaneous Q4H   mouth rinse  15 mL Mouth Rinse 10 times per day   pantoprazole sodium  40 mg Per Tube Daily   rivaroxaban  20 mg Per Tube Q supper   sodium chloride flush  10-40 mL Intracatheter Q12H    Infusions:  sodium chloride     fentaNYL infusion INTRAVENOUS 100 mcg/hr (03/31/21 1000)   norepinephrine (LEVOPHED) Adult infusion 6 mcg/min (03/31/21 1000)   propofol (DIPRIVAN) infusion 40 mcg/kg/min (03/31/21 1000)    PRN Medications: Place/Maintain arterial line **AND** sodium chloride, acetaminophen (TYLENOL) oral liquid 160 mg/5 mL, cyclobenzaprine, docusate sodium, fentaNYL,  metoprolol tartrate, polyethylene glycol, sodium chloride flush   Assessment/Plan   1. Acute Systolic Heart Failure ([place])  - Admitted w/ acute pulmonary edema/ hypoxic respiratory failure  -  Echo LVEF 25-30%, diffuse HK, RV mildly reduced. No prior study for comparison - Etiology uncertain. H/o chemotherapy w/ Fluoropyrimidines (potentially cardiotoxic) but this was remotely. Off chemo since 2003 - He has multiple risk factors for CAD. HS trop not c/w ACS but will need R/LHC once more stable - cMRI if cath unrevealing  - Co-ox ok 68% today  - Continue NE for BP support, currently at 6 mcg. Wean as tolerated. Can add midodrine to help facilitate wean  - responding well to IV diuretics but remains fluid overloaded, CVP 8. F/u CXR today still shows pulmonary edema - Continue IV Lasix 40 mg bid  - Continue digoxin 0.125 for rate control  - No other GDMT yet given hypotension requiring NE    2. ? Developing Septic Shock  - Febrile, mTemp 100.9 - PCT 0.32 - persistent Hypotension requiring NE despite - Check UA, Respiratory and blood cultures - consider empiric abx   3. Acute Hypoxic Respiratory Failure - 2/2 acute pulmonary edema - intubated, vent management per PCCM  - diuresis per above   4. Mitral Regurgitation  - Mod-severe on echo - Suspect functional in setting of severely dilated LV  - Repeat echo after diuresis, if still mod-severe will need TEE    5. AKI on CKD Stage II - Baseline SCr ~1.2  - SCr 1.6>>1.3>1.2  - monitor w/ diuresis  - Avoid hypotension. NE for now.    6. Hypokalemia - K 3.6.  - Supp w/ KCL. Aim to keep > 4.0  - monitor w/ diuresis    7. Type 2DM  - Hgb A1c 7.7  - SGLT2i initiation prior to d/c   8. Atrial Tach vs AFlutter w/ RVR - HR sustaining 140s-150s - Start Amio, bolus 150 x 1 followed by gtt at 60/hr  - will discus initiation of a/c w/ MD  - Keep K > 4.0 and Mg > 2.0     Length of Stay: 2  Lyda Jester, PA-C  03/31/2021,  10:52 AM  Advanced Heart Failure Team Pager [phone] (M-F; 7a - 5p)  Please contact [facility] for night-coverage after hours (5p -7a ) and weekends on [email or web address]  Agree with above.   Remains intubated/sedated. On NE at 6. Co-ox 68%. Excellent diuresis with IV lasix  CVP now 8.  Has developed AFL with RVR. Remains febrile with mildly elevated PCT. On abx.   General:  Intubate/sedated HEENT: normal + ETT Neck: supple. JVP 8 Carotids 2+ bilat; no bruits. No lymphadenopathy or thryomegaly appreciated. Cor: PMI nondisplaced. Irreg tachy  No rubs, gallops or murmurs. Lungs: clear Abdomen: soft, nontender, nondistended. No hepatosplenomegaly. No bruits or masses. Good bowel sounds. Extremities: no cyanosis, clubbing, rash, edema Neuro: intubated/sedated  Remains very tenuous. Suspect combination septic/cardiogenic shock. Continue NE. Agree with IV amio fo AFL. Given need for cath would switch Xarelto to heparin.   Plan cath either tomorrow or Monday depending on clinical course.   CRITICAL CARE Performed by: Glori Bickers  Total critical care time: 35 minutes  Critical care time was exclusive of separately billable procedures and treating other patients.  Critical care was necessary to treat or prevent imminent or life-threatening deterioration.  Critical care was time spent personally by me (independent of midlevel providers or residents) on the following activities: development of treatment plan with patient and/or surrogate as well as nursing, discussions with consultants, evaluation of patient's response to treatment, examination of patient, obtaining history from patient or surrogate, ordering and performing treatments and interventions,  ordering and review of laboratory studies, ordering and review of radiographic studies, pulse oximetry and re-evaluation of patient's condition.  Glori Bickers, MD  3:23 PM

## 2021-03-31 NOTE — Progress Notes (Signed)
Called E-Link about patients temp trending up. Currently 100.9, no PRN tylenol ordered, awaiting orders.

## 2021-04-01 ENCOUNTER — Ambulatory Visit (HOSPITAL_COMMUNITY): Admit: 2021-04-01 | Payer: Medicare HMO | Admitting: Internal Medicine

## 2021-04-01 ENCOUNTER — Inpatient Hospital Stay (HOSPITAL_COMMUNITY): Payer: Medicare HMO

## 2021-04-01 ENCOUNTER — Inpatient Hospital Stay (HOSPITAL_COMMUNITY)
Admission: EM | Disposition: A | Discharge status: Home Health | Payer: Self-pay | Source: Home / Self Care | Attending: Internal Medicine

## 2021-04-01 HISTORY — PX: RIGHT/LEFT HEART CATH AND CORONARY ANGIOGRAPHY: CATH118266

## 2021-04-01 LAB — POCT I-STAT EG7
Acid-Base Excess: 4 mmol/L — ABNORMAL HIGH (ref 0.0–2.0)
Acid-Base Excess: 7 mmol/L — ABNORMAL HIGH (ref 0.0–2.0)
Bicarbonate: 28.1 mmol/L — ABNORMAL HIGH (ref 20.0–28.0)
Bicarbonate: 31.8 mmol/L — ABNORMAL HIGH (ref 20.0–28.0)
Calcium, Ion: 0.86 mmol/L — CL (ref 1.15–1.40)
Calcium, Ion: 1.06 mmol/L — ABNORMAL LOW (ref 1.15–1.40)
HCT: 34 % — ABNORMAL LOW (ref 39.0–52.0)
HCT: 39 % (ref 39.0–52.0)
Hemoglobin: 11.6 g/dL — ABNORMAL LOW (ref 13.0–17.0)
Hemoglobin: 13.3 g/dL (ref 13.0–17.0)
O2 Saturation: 66 %
O2 Saturation: 66 %
Potassium: 2.9 mmol/L — ABNORMAL LOW (ref 3.5–5.1)
Potassium: 3.5 mmol/L (ref 3.5–5.1)
Sodium: 142 mmol/L (ref 135–145)
Sodium: 146 mmol/L — ABNORMAL HIGH (ref 135–145)
TCO2: 29 mmol/L (ref 22–32)
TCO2: 33 mmol/L — ABNORMAL HIGH (ref 22–32)
pCO2, Ven: 41.6 mmHg — ABNORMAL LOW (ref 44.0–60.0)
pCO2, Ven: 45.3 mmHg (ref 44.0–60.0)
pH, Ven: 7.438 — ABNORMAL HIGH (ref 7.250–7.430)
pH, Ven: 7.454 — ABNORMAL HIGH (ref 7.250–7.430)
pO2, Ven: 33 mmHg (ref 32.0–45.0)
pO2, Ven: 33 mmHg (ref 32.0–45.0)

## 2021-04-01 LAB — GLUCOSE, CAPILLARY
Glucose-Capillary: 191 mg/dL — ABNORMAL HIGH (ref 70–99)
Glucose-Capillary: 180 mg/dL — ABNORMAL HIGH (ref 70–99)
Glucose-Capillary: 145 mg/dL — ABNORMAL HIGH (ref 70–99)
Glucose-Capillary: 190 mg/dL — ABNORMAL HIGH (ref 70–99)
Glucose-Capillary: 191 mg/dL — ABNORMAL HIGH (ref 70–99)
Glucose-Capillary: 197 mg/dL — ABNORMAL HIGH (ref 70–99)

## 2021-04-01 LAB — CBC
HCT: 41.8 % (ref 39.0–52.0)
Hemoglobin: 12.7 g/dL — ABNORMAL LOW (ref 13.0–17.0)
MCH: 25.2 pg — ABNORMAL LOW (ref 26.0–34.0)
MCHC: 30.4 g/dL (ref 30.0–36.0)
MCV: 83.1 fL (ref 80.0–100.0)
Platelets: 237 10*3/uL (ref 150–400)
RBC: 5.03 MIL/uL (ref 4.22–5.81)
RDW: 15.9 % — ABNORMAL HIGH (ref 11.5–15.5)
WBC: 8.5 10*3/uL (ref 4.0–10.5)
nRBC: 0 % (ref 0.0–0.2)

## 2021-04-01 LAB — POCT I-STAT 7, (LYTES, BLD GAS, ICA,H+H)
Acid-Base Excess: 5 mmol/L — ABNORMAL HIGH (ref 0.0–2.0)
Bicarbonate: 28.5 mmol/L — ABNORMAL HIGH (ref 20.0–28.0)
Calcium, Ion: 0.99 mmol/L — ABNORMAL LOW (ref 1.15–1.40)
HCT: 37 % — ABNORMAL LOW (ref 39.0–52.0)
Hemoglobin: 12.6 g/dL — ABNORMAL LOW (ref 13.0–17.0)
O2 Saturation: 95 %
Potassium: 3.3 mmol/L — ABNORMAL LOW (ref 3.5–5.1)
Sodium: 143 mmol/L (ref 135–145)
TCO2: 30 mmol/L (ref 22–32)
pCO2 arterial: 38.8 mmHg (ref 32.0–48.0)
pH, Arterial: 7.474 — ABNORMAL HIGH (ref 7.350–7.450)
pO2, Arterial: 69 mmHg — ABNORMAL LOW (ref 83.0–108.0)

## 2021-04-01 LAB — BASIC METABOLIC PANEL
Anion gap: 9 (ref 5–15)
BUN: 23 mg/dL (ref 8–23)
CO2: 28 mmol/L (ref 22–32)
Calcium: 8.7 mg/dL — ABNORMAL LOW (ref 8.9–10.3)
Chloride: 99 mmol/L (ref 98–111)
Creatinine, Ser: 1.12 mg/dL (ref 0.61–1.24)
GFR, Estimated: >60 mL/min (ref >60)
Glucose, Bld: 232 mg/dL — ABNORMAL HIGH (ref 70–99)
Potassium: 3.6 mmol/L (ref 3.5–5.1)
Sodium: 136 mmol/L (ref 135–145)

## 2021-04-01 LAB — APTT
aPTT: 35 seconds (ref 24–36)
aPTT: 46 seconds — ABNORMAL HIGH (ref 24–36)
aPTT: 57 seconds — ABNORMAL HIGH (ref 24–36)

## 2021-04-01 LAB — PROCALCITONIN: Procalcitonin: 118.03 ng/mL

## 2021-04-01 LAB — HEPARIN LEVEL (UNFRACTIONATED): Heparin Unfractionated: 0.45 IU/mL (ref 0.30–0.70)

## 2021-04-01 SURGERY — RIGHT/LEFT HEART CATH AND CORONARY ANGIOGRAPHY
Anesthesia: LOCAL

## 2021-04-01 MED ORDER — ASPIRIN 81 MG PO CHEW
81.0000 mg | CHEWABLE_TABLET | ORAL | Status: AC
  Administered 2021-04-01: 81 mg via ORAL
  Filled 2021-04-01: qty 1

## 2021-04-01 MED ORDER — HYDRALAZINE HCL 20 MG/ML IJ SOLN: 10.0000 mg | INTRAMUSCULAR | Status: AC | PRN

## 2021-04-01 MED ORDER — ASPIRIN 81 MG PO CHEW: 81.0000 mg | CHEWABLE_TABLET | ORAL | Status: DC

## 2021-04-01 MED ORDER — ACETAMINOPHEN 325 MG PO TABS: 650.0000 mg | ORAL_TABLET | ORAL | Status: DC | PRN

## 2021-04-01 MED ORDER — HEPARIN SODIUM (PORCINE) 1000 UNIT/ML IJ SOLN
INTRAMUSCULAR | Status: AC
  Filled 2021-04-01: qty 10

## 2021-04-01 MED ORDER — SODIUM CHLORIDE 0.9 % IV SOLN
1.0000 g | INTRAVENOUS | Status: AC
  Administered 2021-04-01 – 2021-04-04 (×5): 1 g via INTRAVENOUS
  Filled 2021-04-01 (×4): qty 10

## 2021-04-01 MED ORDER — EPINEPHRINE 1 MG/10ML IJ SOSY
PREFILLED_SYRINGE | INTRAMUSCULAR | Status: AC
  Filled 2021-04-01: qty 10

## 2021-04-01 MED ORDER — SODIUM CHLORIDE 0.9 % IV SOLN: 250.0000 mL | INTRAVENOUS | Status: DC | PRN

## 2021-04-01 MED ORDER — SODIUM CHLORIDE 0.9 % IV SOLN: INTRAVENOUS | Status: DC

## 2021-04-01 MED ORDER — HEPARIN (PORCINE) 25000 UT/250ML-% IV SOLN
2250.0000 [IU]/h | INTRAVENOUS | Status: DC
  Administered 2021-04-02: 20:00:00 2050 [IU]/h via INTRAVENOUS
  Administered 2021-04-02 (×2): 1850 [IU]/h via INTRAVENOUS
  Administered 2021-04-03 – 2021-04-04 (×3): 2250 [IU]/h via INTRAVENOUS
  Filled 2021-04-01 (×6): qty 250

## 2021-04-01 MED ORDER — SODIUM CHLORIDE 0.9% FLUSH: 3.0000 mL | INTRAVENOUS | Status: DC | PRN

## 2021-04-01 MED ORDER — ONDANSETRON HCL 4 MG/2ML IJ SOLN
4.0000 mg | Freq: Four times a day (QID) | INTRAMUSCULAR | Status: DC | PRN
  Administered 2021-04-05 – 2021-04-06 (×2): 4 mg via INTRAVENOUS
  Filled 2021-04-01 (×2): qty 2

## 2021-04-01 MED ORDER — IOHEXOL 350 MG/ML SOLN
INTRAVENOUS | Status: DC | PRN
  Administered 2021-04-01: 40 mL

## 2021-04-01 MED ORDER — SODIUM CHLORIDE 0.9% FLUSH
3.0000 mL | Freq: Two times a day (BID) | INTRAVENOUS | Status: DC
  Administered 2021-04-01 – 2021-04-05 (×7): 3 mL via INTRAVENOUS

## 2021-04-01 MED ORDER — LIDOCAINE HCL (PF) 1 % IJ SOLN
INTRAMUSCULAR | Status: AC
  Filled 2021-04-01: qty 30

## 2021-04-01 MED ORDER — HEPARIN (PORCINE) IN NACL 1000-0.9 UT/500ML-% IV SOLN
INTRAVENOUS | Status: AC
  Filled 2021-04-01: qty 1000

## 2021-04-01 MED ORDER — VERAPAMIL HCL 2.5 MG/ML IV SOLN
INTRAVENOUS | Status: AC
  Filled 2021-04-01: qty 2

## 2021-04-01 MED ORDER — VERAPAMIL HCL 2.5 MG/ML IV SOLN: INTRAVENOUS | Status: DC | PRN

## 2021-04-01 MED ORDER — HEPARIN (PORCINE) IN NACL 2000-0.9 UNIT/L-% IV SOLN
INTRAVENOUS | Status: DC | PRN
  Administered 2021-04-01: 1000 mL

## 2021-04-01 MED ORDER — EPINEPHRINE 1 MG/10ML IJ SOSY
PREFILLED_SYRINGE | INTRAMUSCULAR | Status: DC | PRN
  Administered 2021-04-01: .25 mg via INTRAVENOUS

## 2021-04-01 MED ORDER — LABETALOL HCL 5 MG/ML IV SOLN: 10.0000 mg | INTRAVENOUS | Status: AC | PRN

## 2021-04-01 MED ORDER — HEPARIN SODIUM (PORCINE) 1000 UNIT/ML IJ SOLN
INTRAMUSCULAR | Status: DC | PRN
  Administered 2021-04-01: 6000 [IU] via INTRAVENOUS

## 2021-04-01 MED ORDER — NOREPINEPHRINE 4 MG/250ML-% IV SOLN
INTRAVENOUS | Status: AC
  Filled 2021-04-01: qty 250

## 2021-04-01 MED ORDER — SODIUM CHLORIDE 0.9% FLUSH
3.0000 mL | Freq: Two times a day (BID) | INTRAVENOUS | Status: DC
  Administered 2021-04-01 – 2021-04-11 (×15): 3 mL via INTRAVENOUS

## 2021-04-01 MED ORDER — LIDOCAINE HCL (PF) 1 % IJ SOLN
INTRAMUSCULAR | Status: DC | PRN
  Administered 2021-04-01 (×2): 2 mL

## 2021-04-01 MED ORDER — SODIUM CHLORIDE 0.9 % IV SOLN
250.0000 mL | INTRAVENOUS | Status: DC | PRN
  Administered 2021-04-02: 250 mL via INTRAVENOUS

## 2021-04-01 MED ORDER — FENTANYL BOLUS VIA INFUSION
INTRAVENOUS | Status: DC | PRN
  Administered 2021-04-01: 50 ug via INTRAVENOUS

## 2021-04-01 SURGICAL SUPPLY — 15 items
CATH INFINITI 5 FR JL3.5 (CATHETERS) ×1 IMPLANT
CATH INFINITI 5FR AL1 (CATHETERS) ×1 IMPLANT
CATH INFINITI JR4 5F (CATHETERS) ×1 IMPLANT
CATH SWAN GANZ 7F STRAIGHT (CATHETERS) ×1 IMPLANT
DEVICE RAD COMP TR BAND LRG (VASCULAR PRODUCTS) ×1 IMPLANT
GLIDESHEATH SLEND SS 6F .021 (SHEATH) ×1 IMPLANT
GLIDESHEATH SLENDER 7FR .021G (SHEATH) ×1 IMPLANT
GUIDEWIRE INQWIRE 1.5J.035X260 (WIRE) IMPLANT
INQWIRE 1.5J .035X260CM (WIRE) ×2
KIT HEART LEFT (KITS) ×1 IMPLANT
MAT PREVALON FULL STRYKER (MISCELLANEOUS) ×1 IMPLANT
PACK CARDIAC CATHETERIZATION (CUSTOM PROCEDURE TRAY) ×2 IMPLANT
SHEATH PROBE COVER 6X72 (BAG) ×1 IMPLANT
TRANSDUCER W/STOPCOCK (MISCELLANEOUS) ×2 IMPLANT
WIRE MICRO SET SILHO 5FR 7 (SHEATH) ×2 IMPLANT

## 2021-04-01 NOTE — Progress Notes (Signed)
ANTICOAGULATION CONSULT NOTE   Pharmacy Consult for heparin  Indication: Hx DVT   No Known Allergies  Patient Measurements: Height: 6\' 1"  (185.4 cm) Weight: 123.9 kg (273 lb 2.4 oz) IBW/kg (Calculated) : 79.9 Heparin Dosing Weight: 109kg  Vital Signs: Temp: 98.5 F (36.9 C) (12/16 1126) Temp Source: Axillary (12/16 1126) BP: 143/83 (12/16 1703) Pulse Rate: 0 (12/16 1703)  Labs: Recent Labs    03/30/21 0408 03/30/21 2051 03/31/21 0111 03/31/21 0327 03/31/21 0349 04/01/21 0353 04/01/21 1149 04/01/21 1634 04/01/21 1639 04/01/21 1641  HGB 12.1*   < >  --    < > 12.1* 12.7*  --  13.3 11.6* 12.6*  HCT 40.4   < >  --    < > 40.2 41.8  --  39.0 34.0* 37.0*  PLT 254  --   --   --  243 237  --   --   --   --   APTT  --   --  35  --   --  46* 57*  --   --   --   HEPARINUNFRC  --   --   --   --   --  0.45  --   --   --   --   CREATININE 1.31*  --   --   --  1.21 1.12  --   --   --   --    < > = values in this interval not displayed.     Estimated Creatinine Clearance: 94.3 mL/min (by C-G formula based on SCr of 1.12 mg/dL).   Medical History: Past Medical History:  Diagnosis Date   Cancer [facility])    Colon cancer metastasized to liver ([place]) 01/05/2012   Colon carcinoma ([place])    colon ca dx 2001;   Diabetes mellitus without complication ([place])    Hypertension    Muscle weakness-general 01/05/2012      Assessment: 60yom Hx DVT on rivaroxaban pta  - admitted with SOB and LE edema being treated for heart failure exacerbation. Planning for cath this week or next - will hold rivaroxaban and bridge with heparin   Will dose heparin based off aptt for now and await rivaroxaban wash out.   Heparin gtt was turned off for cath lab this afternoon.  Pharmacy asked to restart 8 hrs after sheath pull (removed ~ 5 pm).    Goal of Therapy:  Aptt 66-103 sec  Heparin level 0.3-0.7 Monitor platelets by anticoagulation protocol: Yes   Plan:  Restart IV heparin at previous rate of  1850 units/hr at 0100 on 12/17.   Check heparin level 6 hrs after gtt resumes. Daily heparin level and CBC.  [place] Crane, Roylene Reason, BCCP Clinical Pharmacist  04/01/2021 5:19 PM   [facility] pharmacy phone numbers are listed on [email or web address]

## 2021-04-01 NOTE — Progress Notes (Signed)
ANTICOAGULATION CONSULT NOTE   Pharmacy Consult for heparin  Indication: Hx DVT   No Known Allergies  Patient Measurements: Height: 6\' 1"  (185.4 cm) Weight: 123.9 kg (273 lb 2.4 oz) IBW/kg (Calculated) : 79.9 Heparin Dosing Weight: 109kg  Vital Signs: Temp: 98.5 F (36.9 C) (12/16 1126) Temp Source: Axillary (12/16 1126) BP: 124/71 (12/16 1115) Pulse Rate: 91 (12/16 1300)  Labs: Recent Labs    03/30/21 0408 03/30/21 2051 03/31/21 0111 03/31/21 0327 03/31/21 0349 04/01/21 0353 04/01/21 1149  HGB 12.1*   < >  --  12.9* 12.1* 12.7*  --   HCT 40.4   < >  --  38.0* 40.2 41.8  --   PLT 254  --   --   --  243 237  --   APTT  --   --  35  --   --  46* 57*  HEPARINUNFRC  --   --   --   --   --  0.45  --   CREATININE 1.31*  --   --   --  1.21 1.12  --    < > = values in this interval not displayed.    Estimated Creatinine Clearance: 94.3 mL/min (by C-G formula based on SCr of 1.12 mg/dL).   Medical History: Past Medical History:  Diagnosis Date   Cancer [facility])    Colon cancer metastasized to liver ([place]) 01/05/2012   Colon carcinoma ([place])    colon ca dx 2001;   Diabetes mellitus without complication ([place])    Hypertension    Muscle weakness-general 01/05/2012      Assessment: 60yom Hx DVT on rivaroxaban pta  - admitted with SOB and LE edema being treated for heart failure exacerbation. Planning for cath this week or next - will hold rivaroxaban and bridge with heparin   Will dose heparin based off aptt for now and await rivaroxaban wash out. Follow up post cath to resume rivaroxaban  -aPTT= 57 on 1750 units/hr  Goal of Therapy:  Aptt 66-103 sec  Heparin level 0.3-0.7 Monitor platelets by anticoagulation protocol: Yes   Plan:  -Increase heparin to 1850 units/hr -Follow-up post cath  Sloan Leiter, PharmD, BCPS, BCCCP Clinical Pharmacist  Please refer to [facility] for [place] numbers 04/01/2021, 1:41 PM

## 2021-04-01 NOTE — Progress Notes (Signed)
Pt taken to cath lab with RT, on the vent and drips as charted, report given to Cath lab RN.

## 2021-04-01 NOTE — Progress Notes (Signed)
RT NOTES: Transported patient to cath lab and back to room 3M09 on vent without incident.

## 2021-04-01 NOTE — Progress Notes (Signed)
NAME:  Raymond Allen, MRN:  [id number], DOB:  Feb 07, 1961, LOS: 3 ADMISSION DATE:  03/29/2021, CONSULTATION DATE:  03/29/21 REFERRING MD:  Dr. Jerrol Banana, CHIEF COMPLAINT:  Short of breath   History of Present Illness:  60 yo male former smoker presented with several weeks of dyspnea and edema.  This was associated with cough with clear sputum.  Found to have hypervolemia with pulmonary edema.  Failed Bipap and required intubation.  Pertinent  Medical History  DM type 2, DVT on xarelto, HTN, Colon cancer s/p partial colectomy 2001 with liver mets s/p Lt hepatectomy, CKD 3a, ED  Significant Hospital Events: Including procedures, antibiotic start and stop dates in addition to other pertinent events   12/13 Admit, cardiology consulted, intubated 12/15 febrile  Studies:  Echo 03/29/21 >> EF 25 to 30%, RVSP 38 mmHg, mod LA dilation, mod/severe MR  Interim History / Subjective:  Weaning pressors, RASS -5 ,weaning propofol likely pressor will continue to decline, for cath today  Objective   Blood pressure 124/71, pulse 99, temperature 98.5 F (36.9 C), temperature source Axillary, resp. rate 18, height 6\' 1"  (1.854 m), weight 123.9 kg, SpO2 100 %. CVP:  [0 mmHg-9 mmHg] 7 mmHg  Vent Mode: PRVC FiO2 (%):  [40 %] 40 % Set Rate:  [18 bmp] 18 bmp Vt Set:  [630 mL] 630 mL PEEP:  [5 cmH20] 5 cmH20 Delta P (Amplitude):  [25] 25 Plateau Pressure:  [20 cmH20-22 cmH20] 22 cmH20   Intake/Output Summary (Last 24 hours) at 04/01/2021 1151 Last data filed at 04/01/2021 1000 Gross per 24 hour  Intake 3812.33 ml  Output 2615 ml  Net 1197.33 ml    Filed Weights   03/30/21 0418 03/31/21 0500 04/01/21 0500  Weight: 128.7 kg 122.2 kg 123.9 kg    Examination:  General - sedated, diaphoretic Eyes - pupils reactive ENT - ETT in place Cardiac - regular, tachycardic Chest - b/l rhonchi Abdomen - soft, non tender, + bowel sounds Extremities - 1+ edema Skin - no rashes Neuro - RASS -5  Resolved  Hospital Problem list     Assessment & Plan:   Acute hypoxic respiratory failure from acute pulmonary edema. - full vent support for now - advacne ET tube 4 cm 12/16 - continue lasix 40 mg IV BID  Acute systolic CHF. Mitral regurgitation likely functional in setting of dilated cardiomyopathy. Cardiogenic shock. - weaning off; goal MAP > 65 - heart failure team following - plan heart cath 12/16 - hold outpt norvasc, HCTZ, lisinopril, crestor  Atrial arrhythmia: Now NSR after amio drip --transition amio gtt to oral, continue load 400 mg TID  Fever. - blood culture NGTD, UA clear - Sputum culture Strep agalactiae, CTX x 5 days 12/16 >> - procal 118 - prn tylenol  Hx of DVT. - continue xarelto  Hypokalemia. CKD 3a. - f/u BMET  Acute metabolic encephalopathy from hypoxic respiratory failure. - RASS goal 0 to -1, curretnly -5 - wean propofol  DM type 2 with neuropathy. - SSI - hold outpt metformin, ozempic - continue neurontin  Best Practice (right click and "Reselect all SmartList Selections" daily)   Diet/type: tubefeeds DVT prophylaxis: DOAC GI prophylaxis: PPI Lines: Central line Foley:  N/A Code Status:  full code Last date of multidisciplinary goals of care discussion [x]   Labs    CMP Latest Ref Rng & Units 04/01/2021 03/31/2021 03/31/2021  Glucose 70 - 99 mg/dL 232(H) 200(H) -  BUN 8 - 23 mg/dL 23 21 -  Creatinine  0.61 - 1.24 mg/dL 1.12 1.21 -  Sodium 135 - 145 mmol/L 136 138 138  Potassium 3.5 - 5.1 mmol/L 3.6 3.6 3.5  Chloride 98 - 111 mmol/L 99 102 -  CO2 22 - 32 mmol/L 28 30 -  Calcium 8.9 - 10.3 mg/dL 8.7(L) 8.6(L) -  Total Protein 6.5 - 8.1 g/dL - - -  Total Bilirubin 0.3 - 1.2 mg/dL - - -  Alkaline Phos 38 - 126 U/L - - -  AST 15 - 41 U/L - - -  ALT 0 - 44 U/L - - -    CBC Latest Ref Rng & Units 04/01/2021 03/31/2021 03/31/2021  WBC 4.0 - 10.5 K/uL 8.5 8.3 -  Hemoglobin 13.0 - 17.0 g/dL 12.7(L) 12.1(L) 12.9(L)  Hematocrit 39.0 - 52.0 %  41.8 40.2 38.0(L)  Platelets 150 - 400 K/uL 237 243 -    ABG    Component Value Date/Time   PHART 7.421 03/31/2021 0327   PCO2ART 50.1 (H) 03/31/2021 0327   PO2ART 98 03/31/2021 0327   HCO3 32.4 (H) 03/31/2021 0327   TCO2 34 (H) 03/31/2021 0327   ACIDBASEDEF 7.0 (H) 11/29/2007 0320   O2SAT 68.3 03/31/2021 1128    CBG (last 3)  Recent Labs    04/01/21 0332 04/01/21 0717 04/01/21 1124  GLUCAP 191* 180* 145*     Critical care time:    CRITICAL CARE Performed by: Bonna Gains Laurenashley Viar   Total critical care time: 40 minutes  Critical care time was exclusive of separately billable procedures and treating other patients.  Critical care was necessary to treat or prevent imminent or life-threatening deterioration.  Critical care was time spent personally by me on the following activities: development of treatment plan with patient and/or surrogate as well as nursing, discussions with consultants, evaluation of patient's response to treatment, examination of patient, obtaining history from patient or surrogate, ordering and performing treatments and interventions, ordering and review of laboratory studies, ordering and review of radiographic studies, pulse oximetry and re-evaluation of patient's condition.  Lanier Clam, MD [place] for contact info 04/01/2021, 11:51 AM

## 2021-04-01 NOTE — Progress Notes (Signed)
RT NOTE: attempted SBT this AM, on CPAP/PSV of 12/5 at 0740, however patient went apneic and backup ventilation alarmed.  Patient placed back on full support ventilator settings and is currently tolerating well at this time.  Will continue to monitor and wean as tolerated.

## 2021-04-01 NOTE — Progress Notes (Signed)
Next of kin notified of pt's cardiac cath, consent obtained and witnessed. MD also notified of pt's 6 beat run of unsustained V tach. VS wnl, levophed off.

## 2021-04-01 NOTE — Progress Notes (Signed)
ANTICOAGULATION CONSULT NOTE   Pharmacy Consult for heparin  Indication: Hx DVT   No Known Allergies  Patient Measurements: Height: 6\' 1"  (185.4 cm) Weight: 122.2 kg (269 lb 6.4 oz) IBW/kg (Calculated) : 79.9 Heparin Dosing Weight: 109kg  Vital Signs: Temp: 99 F (37.2 C) (12/16 0300) Temp Source: Oral (12/16 0300) Pulse Rate: 89 (12/16 0400)  Labs: Recent Labs    03/29/21 0501 03/29/21 0628 03/29/21 1219 03/30/21 0408 03/30/21 2051 03/31/21 0111 03/31/21 0327 03/31/21 0349 04/01/21 0353  HGB 12.8*  --    < > 12.1*   < >  --  12.9* 12.1* 12.7*  HCT 43.0  --    < > 40.4   < >  --  38.0* 40.2 41.8  PLT 260  --   --  254  --   --   --  243 237  APTT  --   --   --   --   --  35  --   --  46*  HEPARINUNFRC  --   --   --   --   --   --   --   --  0.45  CREATININE 1.59*  --   --  1.31*  --   --   --  1.21 1.12  TROPONINIHS 44* 39*  --   --   --   --   --   --   --    < > = values in this interval not displayed.     Estimated Creatinine Clearance: 93.6 mL/min (by C-G formula based on SCr of 1.12 mg/dL).   Medical History: Past Medical History:  Diagnosis Date   Cancer [facility])    Colon cancer metastasized to liver ([place]) 01/05/2012   Colon carcinoma ([place])    colon ca dx 2001;   Diabetes mellitus without complication ([place])    Hypertension    Muscle weakness-general 01/05/2012      Assessment: 60yom Hx DVT on rivaroxaban pta  - admitted with SOB and LE edema being treated for heart failure exacerbation. Planning for cath this week or next - will hold rivaroxaban and bridge with heparin   Will dose heparin based off aptt for now and await rivaroxaban wash out. Follow up post cath to resume rivaroxaban  -aPTT= 46 on 1400 units/hr  Goal of Therapy:  Aptt 66-103 sec  Heparin level 0.3-0.7 Monitor platelets by anticoagulation protocol: Yes   Plan:  -Increase heparin to 1750 units/hr -aPTT in 6 hours  Hildred Laser, PharmD Clinical Pharmacist **Pharmacist phone  directory can now be found on [email or web address] ([email or web address]).  Listed under [place].

## 2021-04-01 NOTE — Progress Notes (Signed)
Advanced Heart Failure Rounding Note  PCP-Cardiologist: Dr. Smith/Dr. Elen Acero    Subjective:    60/ y/o male with DM2, HTN, former smoker, remote h/o colon cancer -> liver CA (2003) admitted with acute respiratory failure and pulmonary edema. Found to have new onset CM EF 25-30%. Subsequently developed AF with RVR.   Remains intubated/sedated. Attempted vent wean but quickly became apneic.   Continues with low grade fevers. WBC normal. PCT 118 (!) today. Sputum culture Strep agalactiae, Started on ceftriaxone x 5 days today  NE weaned off but had to be restarted during cath.   Converted to NSR on IV amio. On heparin  Cath results from today -> mild CAD. Severe NICM.     Prox RCA to Mid RCA lesion is 40% stenosed.   Prox Cx to Mid Cx lesion is 50% stenosed.   Mid LAD lesion is 30% stenosed.   Findings:   Ao = 61/48 (52) -> NE restarted  LV = 94/27 -> on NE RA =  8 RV = 27/8 PA = 29/12 (22) PCW = 14 Fick cardiac output/index = 6.5/2.7 (prior to NE) PVR = 1.2 WU SVR = 539 Ao sat = 95% PA sat = 66%, 64% PAPi = 2.1   Assessment: 1. Non-obstructive CAD 2. Severe NICM 3. Preserved CO (prior to NE)   Plan/Discussion:    He has severe NICM. Low SVR and need to re-initiate NE in setting of preserved CO raises concern for distributive component to his hypotension.   Objective:   Weight Range: 123.9 kg Body mass index is 36.04 kg/m.   Vital Signs:   Temp:  [98.4 F (36.9 C)-99.3 F (37.4 C)] 99.3 F (37.4 C) (12/16 2000) Pulse Rate:  [0-135] 87 (12/16 2000) Resp:  [0-23] 18 (12/16 2000) BP: (81-145)/(48-87) 112/74 (12/16 2000) SpO2:  [0 %-100 %] 98 % (12/16 2000) Arterial Line BP: (78-146)/(55-77) 118/58 (12/16 2000) FiO2 (%):  [40 %] 40 % (12/16 2008) Weight:  [123.9 kg] 123.9 kg (12/16 0500) Last BM Date: 03/29/21  Weight change: Filed Weights   03/30/21 0418 03/31/21 0500 04/01/21 0500  Weight: 128.7 kg 122.2 kg 123.9 kg     Intake/Output:   Intake/Output Summary (Last 24 hours) at 04/01/2021 2018 Last data filed at 04/01/2021 2000 Gross per 24 hour  Intake 2969.76 ml  Output 2700 ml  Net 269.76 ml       Physical Exam    General:  ill-appearing. Sedated on vent HEENT: normal +ETT Neck: supple. +TLC Carotids 2+ bilat; no bruits. No lymphadenopathy or thryomegaly appreciated. Cor: PMI nondisplaced. Regular rate & rhythm. No rubs, gallops or murmurs. Lungs: coarse Abdomen: soft, nontender, nondistended. No hepatosplenomegaly. No bruits or masses. Good bowel sounds. Extremities: no cyanosis, clubbing, rash, tr edema Neuro: intubated/sedated   Telemetry   Sinus 80s Personally reviewed   Labs    CBC Recent Labs    03/31/21 0349 04/01/21 0353 04/01/21 1634 04/01/21 1639 04/01/21 1641  WBC 8.3 8.5  --   --   --   HGB 12.1* 12.7*   < > 11.6* 12.6*  HCT 40.2 41.8   < > 34.0* 37.0*  MCV 83.6 83.1  --   --   --   PLT 243 237  --   --   --    < > = values in this interval not displayed.    Basic Metabolic Panel Recent Labs    03/30/21 0408 03/30/21 2051 03/31/21 0349 04/01/21 0353 04/01/21 1634 04/01/21  1639 04/01/21 1641  NA 137   < > 138 136   < > 146* 143  K 3.4*   < > 3.6 3.6   < > 2.9* 3.3*  CL 102  --  102 99  --   --   --   CO2 27  --  30 28  --   --   --   GLUCOSE 116*  --  200* 232*  --   --   --   BUN 17  --  21 23  --   --   --   CREATININE 1.31*  --  1.21 1.12  --   --   --   CALCIUM 8.6*  --  8.6* 8.7*  --   --   --   MG 2.0  --  2.0  --   --   --   --   PHOS 3.6  --  4.5  --   --   --   --    < > = values in this interval not displayed.    Liver Function Tests Recent Labs    03/30/21 0408  AST 14*  ALT 14  ALKPHOS 40  BILITOT 2.4*  PROT 7.0  ALBUMIN 2.9*    No results for input(s): LIPASE, AMYLASE in the last 72 hours. Cardiac Enzymes No results for input(s): CKTOTAL, CKMB, CKMBINDEX, TROPONINI in the last 72 hours.  BNP: BNP (last 3  results) Recent Labs    03/29/21 0502  BNP 391.6*     ProBNP (last 3 results) No results for input(s): PROBNP in the last 8760 hours.   D-Dimer No results for input(s): DDIMER in the last 72 hours. Hemoglobin A1C No results for input(s): HGBA1C in the last 72 hours.  Fasting Lipid Panel Recent Labs    03/30/21 0408  TRIG 98    Thyroid Function Tests Recent Labs    03/31/21 0349  TSH 0.599     Other results:   Imaging    CARDIAC CATHETERIZATION  Result Date: 04/01/2021   Prox RCA to Mid RCA lesion is 40% stenosed.   Prox Cx to Mid Cx lesion is 50% stenosed.   Mid LAD lesion is 30% stenosed. Findings: Ao = 61/48 (52) -> NE restarted LV = 94/27 -> on NE RA =  8 RV = 27/8 PA = 29/12 (22) PCW = 14 Fick cardiac output/index = 6.5/2.7 (prior to NE) PVR = 1.2 WU SVR = 539 Ao sat = 95% PA sat = 66%, 64% PAPi = 2.1 Assessment: 1. Non-obstructive CAD 2. Severe NICM 3. Preserved CO (prior to NE) Plan/Discussion: He has severe NICM. Low SVR and need to re-initiate NE in setting of preserved CO raises concern for distributive component to his hypotension. Glori Bickers, MD 8:16 PM  DG Chest Port 1 View  Result Date: 04/01/2021 CLINICAL DATA:  Respiratory failure EXAM: PORTABLE CHEST 1 VIEW COMPARISON:  Chest x-ray dated March 31, 2021 FINDINGS: ETT tip is at the thoracic inlet. Left IJ line with tip at the junction of the brachiocephalic vein and SVC. Feeding tube tip is in the stomach. Increased bilateral heterogeneous pulmonary opacities. IMPRESSION: 1. ETT tip is at the thoracic inlet, consider advancement for optimal positioning. 2. Increased bilateral heterogeneous pulmonary opacities, possibly due to worsening atelectasis or pulmonary edema. Electronically Signed   By: Yetta Glassman M.D.   On: 04/01/2021 09:27     Medications:     Scheduled Medications:  chlorhexidine gluconate (MEDLINE KIT)  15 mL Mouth Rinse BID   Chlorhexidine Gluconate Cloth  6 each Topical  Daily   digoxin  0.125 mg Per Tube Daily   feeding supplement (PROSource TF)  45 mL Per Tube QID   feeding supplement (VITAL HIGH PROTEIN)  1,000 mL Per Tube Q24H   furosemide  40 mg Intravenous BID   gabapentin  600 mg Per Tube BID WC   gabapentin  900 mg Per Tube QHS   insulin aspart  0-20 Units Subcutaneous Q4H   mouth rinse  15 mL Mouth Rinse 10 times per day   pantoprazole sodium  40 mg Per Tube Daily   sodium chloride flush  10-40 mL Intracatheter Q12H   sodium chloride flush  3 mL Intravenous Q12H   sodium chloride flush  3 mL Intravenous Q12H    Infusions:  sodium chloride     sodium chloride     amiodarone 30 mg/hr (04/01/21 2000)   cefTRIAXone (ROCEPHIN)  IV Stopped (04/01/21 1842)   fentaNYL infusion INTRAVENOUS 100 mcg/hr (04/01/21 2005)   [START ON 04/02/2021] heparin     norepinephrine (LEVOPHED) Adult infusion 0 mcg/min (04/01/21 1800)   propofol (DIPRIVAN) infusion 30 mcg/kg/min (04/01/21 2006)    PRN Medications: Place/Maintain arterial line **AND** sodium chloride, sodium chloride, acetaminophen (TYLENOL) oral liquid 160 mg/5 mL, acetaminophen, cyclobenzaprine, docusate sodium, fentaNYL, hydrALAZINE, labetalol, metoprolol tartrate, ondansetron (ZOFRAN) IV, polyethylene glycol, sodium chloride flush, sodium chloride flush   Assessment/Plan   1. Acute Systolic Heart Failure (New)  - Admitted w/ acute pulmonary edema/ hypoxic respiratory failure  - Echo LVEF 25-30%, diffuse HK, RV mildly reduced. No prior study for comparison - Etiology uncertain. H/o chemotherapy w/ Fluoropyrimidines (potentially cardiotoxic) but this was remotely. Off chemo since 2003 - Cath 12/16 minimal CAD. Preserved CO. Low SVR - Now back on NE - PCWP 14 today on cath but LVEDP 27. Likely mild volume overload but would not diurese currently with septic physiology  - Continue digoxin 0.125  - No other GDMT yet given hypotension requiring NE    2. Septic Shock  - Febrile,  - PCT 0.32 ->  118  - Remains on NE. Can add VP as needed - CCM started ceftriaxone for strep agalactiae. Low threshold to broaden if pressor requirements increasing   3. Acute Hypoxic Respiratory Failure - 2/2 acute pulmonary edema and PNA - intubated, vent management per PCCM    4. Mitral Regurgitation  - Mod-severe on echo. No significant v-waves in PCWP tracing on cath - Suspect functional in setting of severely dilated LV  - Doubt this is major driver currently. Can repeat echo prior to d/c and consider TEE as needed   5. 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 Hypokalemia - supp - Supp w/ KCL. Aim to keep > 4.0   7. Type 2DM  - Hgb A1c 7.7  - Consider SGLT2i initiation prior to d/c   8. Atrial Tach vs AFlutter w/ RVR - Now in NSR on IV amio. Continue - Continue heparin  CRITICAL CARE Performed by: Glori Bickers  Total critical care time: 35 minutes  Critical care time was exclusive of separately billable procedures and treating other patients.  Critical care was necessary to treat or prevent imminent or life-threatening deterioration.  Critical care was time spent personally by me (independent of midlevel providers or residents) on the following activities: development of treatment plan with  patient and/or surrogate as well as nursing, discussions with consultants, evaluation of patient's response to treatment, examination of patient, obtaining history from patient or surrogate, ordering and performing treatments and interventions, ordering and review of laboratory studies, ordering and review of radiographic studies, pulse oximetry and re-evaluation of patient's condition.   Length of Stay: 3  Glori Bickers, MD  04/01/2021, 8:18 PM  Advanced Heart Failure Team Pager [phone] (M-F; 7a - 5p)  Please contact [facility] for night-coverage after hours (5p -7a ) and weekends on [email or web address]

## 2021-04-01 NOTE — Interval H&P Note (Signed)
History and Physical Interval Note:  04/01/2021 4:18 PM  Raymond Allen  has presented today for surgery, with the diagnosis of hf.  The various methods of treatment have been discussed with the patient and family. After consideration of risks, benefits and other options for treatment, the patient has consented to  Procedure(s): RIGHT/LEFT HEART CATH AND CORONARY ANGIOGRAPHY (N/A) and possible coronary angioplasty as a surgical intervention.  The patient's history has been reviewed, patient examined, no change in status, stable for surgery.  I have reviewed the patient's chart and labs.  Questions were answered to the patient's satisfaction.     Shirleyann Montero

## 2021-04-02 LAB — CULTURE, RESPIRATORY W GRAM STAIN

## 2021-04-02 LAB — CBC
HCT: 39 % (ref 39.0–52.0)
Hemoglobin: 11.3 g/dL — ABNORMAL LOW (ref 13.0–17.0)
MCH: 24.2 pg — ABNORMAL LOW (ref 26.0–34.0)
MCHC: 29 g/dL — ABNORMAL LOW (ref 30.0–36.0)
MCV: 83.7 fL (ref 80.0–100.0)
Platelets: 227 10*3/uL (ref 150–400)
RBC: 4.66 MIL/uL (ref 4.22–5.81)
RDW: 15.9 % — ABNORMAL HIGH (ref 11.5–15.5)
WBC: 8.4 10*3/uL (ref 4.0–10.5)
nRBC: 0 % (ref 0.0–0.2)

## 2021-04-02 LAB — COOXEMETRY PANEL
Carboxyhemoglobin: 1.1 % (ref 0.5–1.5)
Methemoglobin: 0.8 % (ref 0.0–1.5)
O2 Saturation: 66.9 %
Total hemoglobin: 12 g/dL (ref 12.0–16.0)

## 2021-04-02 LAB — BASIC METABOLIC PANEL
Anion gap: 9 (ref 5–15)
BUN: 23 mg/dL (ref 8–23)
CO2: 30 mmol/L (ref 22–32)
Calcium: 8.8 mg/dL — ABNORMAL LOW (ref 8.9–10.3)
Chloride: 99 mmol/L (ref 98–111)
Creatinine, Ser: 1.11 mg/dL (ref 0.61–1.24)
GFR, Estimated: >60 mL/min (ref >60)
Glucose, Bld: 218 mg/dL — ABNORMAL HIGH (ref 70–99)
Potassium: 3.2 mmol/L — ABNORMAL LOW (ref 3.5–5.1)
Sodium: 138 mmol/L (ref 135–145)

## 2021-04-02 LAB — TRIGLYCERIDES: Triglycerides: 162 mg/dL — ABNORMAL HIGH (ref <150)

## 2021-04-02 LAB — GLUCOSE, CAPILLARY
Glucose-Capillary: 173 mg/dL — ABNORMAL HIGH (ref 70–99)
Glucose-Capillary: 198 mg/dL — ABNORMAL HIGH (ref 70–99)
Glucose-Capillary: 199 mg/dL — ABNORMAL HIGH (ref 70–99)
Glucose-Capillary: 165 mg/dL — ABNORMAL HIGH (ref 70–99)
Glucose-Capillary: 216 mg/dL — ABNORMAL HIGH (ref 70–99)
Glucose-Capillary: 216 mg/dL — ABNORMAL HIGH (ref 70–99)

## 2021-04-02 LAB — PROCALCITONIN: Procalcitonin: 0.22 ng/mL

## 2021-04-02 LAB — HEPARIN LEVEL (UNFRACTIONATED): Heparin Unfractionated: 0.37 IU/mL (ref 0.30–0.70)

## 2021-04-02 LAB — APTT
aPTT: 56 seconds — ABNORMAL HIGH (ref 24–36)
aPTT: 55 seconds — ABNORMAL HIGH (ref 24–36)

## 2021-04-02 LAB — MAGNESIUM: Magnesium: 1.8 mg/dL (ref 1.7–2.4)

## 2021-04-02 MED ORDER — SPIRONOLACTONE 12.5 MG HALF TABLET
12.5000 mg | ORAL_TABLET | Freq: Every day | ORAL | Status: DC
  Administered 2021-04-02 – 2021-04-06 (×5): 12.5 mg
  Filled 2021-04-02 (×5): qty 1

## 2021-04-02 MED ORDER — POTASSIUM CHLORIDE 20 MEQ PO PACK
40.0000 meq | PACK | ORAL | Status: AC
  Administered 2021-04-02 (×2): 40 meq
  Filled 2021-04-02 (×2): qty 2

## 2021-04-02 MED ORDER — POTASSIUM CHLORIDE 20 MEQ PO PACK
40.0000 meq | PACK | Freq: Once | ORAL | Status: AC
  Administered 2021-04-02: 40 meq
  Filled 2021-04-02: qty 2

## 2021-04-02 MED ORDER — AMIODARONE HCL 200 MG PO TABS
400.0000 mg | ORAL_TABLET | Freq: Every day | ORAL | Status: DC
Start: 2021-04-09 — End: 2021-04-03

## 2021-04-02 MED ORDER — AMIODARONE HCL 200 MG PO TABS
400.0000 mg | ORAL_TABLET | Freq: Three times a day (TID) | ORAL | Status: DC
  Administered 2021-04-02 – 2021-04-03 (×2): 400 mg
  Filled 2021-04-02 (×2): qty 2

## 2021-04-02 MED ORDER — MAGNESIUM SULFATE 2 GM/50ML IV SOLN
2.0000 g | Freq: Once | INTRAVENOUS | Status: AC
  Administered 2021-04-02: 2 g via INTRAVENOUS
  Filled 2021-04-02: qty 50

## 2021-04-02 MED ORDER — FUROSEMIDE 10 MG/ML IJ SOLN
80.0000 mg | Freq: Two times a day (BID) | INTRAMUSCULAR | Status: DC
  Administered 2021-04-02 – 2021-04-03 (×2): 80 mg via INTRAVENOUS
  Filled 2021-04-02 (×2): qty 8

## 2021-04-02 NOTE — Progress Notes (Signed)
ANTICOAGULATION CONSULT NOTE  Pharmacy Consult:  Heparin Indication: Hx DVT   No Known Allergies  Patient Measurements: Height: 6\' 1"  (185.4 cm) Weight: 124 kg (273 lb 5.9 oz) IBW/kg (Calculated) : 79.9 Heparin Dosing Weight: 109kg  Vital Signs: Temp: 98.7 F (37.1 C) (12/17 1124) Temp Source: Oral (12/17 1124) BP: 114/78 (12/17 0745) Pulse Rate: 88 (12/17 1121)  Labs: Recent Labs    03/31/21 0349 04/01/21 0353 04/01/21 1149 04/01/21 1634 04/01/21 1639 04/01/21 1641 04/02/21 0412 04/02/21 0815 04/02/21 1000  HGB 12.1* 12.7*  --    < > 11.6* 12.6* 11.3*  --   --   HCT 40.2 41.8  --    < > 34.0* 37.0* 39.0  --   --   PLT 243 237  --   --   --   --  227  --   --   APTT  --  46* 57*  --   --   --   --   --  56*  HEPARINUNFRC  --  0.45  --   --   --   --   --   --  0.37  CREATININE 1.21 1.12  --   --   --   --   --  1.11  --    < > = values in this interval not displayed.     Estimated Creatinine Clearance: 95.2 mL/min (by C-G formula based on SCr of 1.11 mg/dL).   Assessment: 49 YOM with history of DVT on rivaroxaban PTA.  Admitted with SOB and LE edema being treated for heart failure exacerbation.  Patient is s/p cath, found to have non-obstructive CAD with severe NICM.  Pharmacy asked to resume IV heparin post cath.  Will dose heparin based off aPTT for now and await rivaroxaban wash out.   aPTT sub-therapeutic at 56 sec on 1850 units/hr.  No bleeding reported.  Goal of Therapy:  APTT 66-103 sec  Heparin level 0.3-0.7 units/mL Monitor platelets by anticoagulation protocol: Yes   Plan:  Increase IV heparin to 2050 units/hr Check 6 hr aPTT Daily heparin level, aPTT and CBC  Jonerik Sliker D. Mina Marble, PharmD, BCPS, [place] 04/02/2021, 12:38 PM

## 2021-04-02 NOTE — Progress Notes (Signed)
NAME:  Raymond Allen, MRN:  [id number], DOB:  11-11-1960, LOS: 4 ADMISSION DATE:  03/29/2021, CONSULTATION DATE:  03/29/21 REFERRING MD:  Dr. Jerrol Banana, CHIEF COMPLAINT:  Short of breath   History of Present Illness:  60 yo male former smoker presented with several weeks of dyspnea and edema.  This was associated with cough with clear sputum.  Found to have hypervolemia with pulmonary edema.  Failed Bipap and required intubation.  Pertinent  Medical History  DM type 2, DVT on xarelto, HTN, Colon cancer s/p partial colectomy 2001 with liver mets s/p Lt hepatectomy, CKD 3a, ED  Significant Hospital Events: Including procedures, antibiotic start and stop dates in addition to other pertinent events   12/13 Admit, cardiology consulted, intubated 12/15 febrile 12/16 poor mental status, RHC/LHC with non-obstructive CAD, mean PA elevated with normal PVR, preserved CO 12/17 follows commands  Studies:  Echo 03/29/21 >> EF 25 to 30%, RVSP 38 mmHg, mod LA dilation, mod/severe MR  Interim History / Subjective:  Pressors off. Follows commands, much improved from yesterday  Objective   Blood pressure 114/78, pulse 88, temperature 98.7 F (37.1 C), temperature source Oral, resp. rate 20, height 6\' 1"  (1.854 m), weight 124 kg, SpO2 96 %. CVP:  [5 mmHg-74 mmHg] 10 mmHg  Vent Mode: PRVC FiO2 (%):  [40 %] 40 % Set Rate:  [18 bmp] 18 bmp Vt Set:  [630 mL] 630 mL PEEP:  [5 cmH20] 5 cmH20 Plateau Pressure:  [21 cmH20-24 cmH20] 21 cmH20   Intake/Output Summary (Last 24 hours) at 04/02/2021 1226 Last data filed at 04/02/2021 0900 Gross per 24 hour  Intake 1439.81 ml  Output 1870 ml  Net -430.19 ml    Filed Weights   03/31/21 0500 04/01/21 0500 04/02/21 0500  Weight: 122.2 kg 123.9 kg 124 kg    Examination:  General - sedated, diaphoretic Eyes - pupils reactive ENT - ETT in place Cardiac - regular, tachycardic Chest - b/l rhonchi Abdomen - soft, non tender, + bowel sounds Extremities - 1+  edema Skin - no rashes Neuro - follows commands  Resolved Hospital Problem list     Assessment & Plan:   Acute hypoxic respiratory failure from acute pulmonary edema. - full vent support for now - increase lasix 80 mg IV BID per HF team 96/28  Acute systolic CHF. Mitral regurgitation likely functional in setting of dilated cardiomyopathy. Septic shock (resolved) - heart failure team following - Heart cath with preserved CO, low SVR suggestive of distributive shock - hold outpt norvasc, HCTZ, lisinopril, crestor  Atrial arrhythmia: Now NSR after amio drip --transition amio gtt to oral, continue load 400 mg TID for 10g  Fever, pneumonia. - blood culture NGTD, UA clear - Sputum culture Strep agalactiae, CTX x 5 days 12/16 >> - procal 118 - prn tylenol  Hx of DVT. - continue xarelto  Hypokalemia. CKD 3a. - f/u BMET  Acute metabolic encephalopathy from hypoxic respiratory failure.: Following commands on lower dose propofol. - RASS goal 0 to -1 - wean propofol as able  DM type 2 with neuropathy. - SSI - hold outpt metformin, ozempic - continue neurontin  Best Practice (right click and "Reselect all SmartList Selections" daily)   Diet/type: tubefeeds DVT prophylaxis: systemic heparin GI prophylaxis: PPI Lines: Central line Foley:  N/A Code Status:  full code Last date of multidisciplinary goals of care discussion [x]   Labs    CMP Latest Ref Rng & Units 04/02/2021 04/01/2021 04/01/2021  Glucose 70 - 99 mg/dL  218(H) - -  BUN 8 - 23 mg/dL 23 - -  Creatinine 0.61 - 1.24 mg/dL 1.11 - -  Sodium 135 - 145 mmol/L 138 143 146(H)  Potassium 3.5 - 5.1 mmol/L 3.2(L) 3.3(L) 2.9(L)  Chloride 98 - 111 mmol/L 99 - -  CO2 22 - 32 mmol/L 30 - -  Calcium 8.9 - 10.3 mg/dL 8.8(L) - -  Total Protein 6.5 - 8.1 g/dL - - -  Total Bilirubin 0.3 - 1.2 mg/dL - - -  Alkaline Phos 38 - 126 U/L - - -  AST 15 - 41 U/L - - -  ALT 0 - 44 U/L - - -    CBC Latest Ref Rng & Units  04/02/2021 04/01/2021 04/01/2021  WBC 4.0 - 10.5 K/uL 8.4 - -  Hemoglobin 13.0 - 17.0 g/dL 11.3(L) 12.6(L) 11.6(L)  Hematocrit 39.0 - 52.0 % 39.0 37.0(L) 34.0(L)  Platelets 150 - 400 K/uL 227 - -    ABG    Component Value Date/Time   PHART 7.474 (H) 04/01/2021 1641   PCO2ART 38.8 04/01/2021 1641   PO2ART 69 (L) 04/01/2021 1641   HCO3 28.5 (H) 04/01/2021 1641   TCO2 30 04/01/2021 1641   ACIDBASEDEF 7.0 (H) 11/29/2007 0320   O2SAT 95.0 04/01/2021 1641    CBG (last 3)  Recent Labs    04/02/21 0320 04/02/21 0725 04/02/21 1124  GLUCAP 173* 198* 199*     Critical care time:    CRITICAL CARE Performed by: Lanier Clam   Total critical care time: 35 minutes  Critical care time was exclusive of separately billable procedures and treating other patients.  Critical care was necessary to treat or prevent imminent or life-threatening deterioration.  Critical care was time spent personally by me on the following activities: development of treatment plan with patient and/or surrogate as well as nursing, discussions with consultants, evaluation of patient's response to treatment, examination of patient, obtaining history from patient or surrogate, ordering and performing treatments and interventions, ordering and review of laboratory studies, ordering and review of radiographic studies, pulse oximetry and re-evaluation of patient's condition.  Lanier Clam, MD [place] for contact info 04/02/2021, 12:26 PM

## 2021-04-02 NOTE — Progress Notes (Signed)
ANTICOAGULATION CONSULT NOTE  Pharmacy Consult:  Heparin Indication: Hx DVT   No Known Allergies  Patient Measurements: Height: 6\' 1"  (185.4 cm) Weight: 124 kg (273 lb 5.9 oz) IBW/kg (Calculated) : 79.9 Heparin Dosing Weight: 109kg  Vital Signs: Temp: 98.9 F (37.2 C) (12/17 2001) Temp Source: Oral (12/17 2001) BP: 101/60 (12/17 1512) Pulse Rate: 100 (12/17 1817)  Labs: Recent Labs    03/31/21 0349 04/01/21 0353 04/01/21 1149 04/01/21 1634 04/01/21 1639 04/01/21 1641 04/02/21 0412 04/02/21 0815 04/02/21 1000 04/02/21 1933  HGB 12.1* 12.7*  --    < > 11.6* 12.6* 11.3*  --   --   --   HCT 40.2 41.8  --    < > 34.0* 37.0* 39.0  --   --   --   PLT 243 237  --   --   --   --  227  --   --   --   APTT  --  46* 57*  --   --   --   --   --  56* 55*  HEPARINUNFRC  --  0.45  --   --   --   --   --   --  0.37  --   CREATININE 1.21 1.12  --   --   --   --   --  1.11  --   --    < > = values in this interval not displayed.     Estimated Creatinine Clearance: 95.2 mL/min (by C-G formula based on SCr of 1.11 mg/dL).   Assessment: 11 YOM with history of DVT on rivaroxaban PTA.  Admitted with SOB and LE edema being treated for heart failure exacerbation.  Patient is s/p cath, found to have non-obstructive CAD with severe NICM.  Pharmacy asked to resume IV heparin post cath.  Will dose heparin based off aPTT for now and await rivaroxaban wash out. aPTT remains sub-therapeutic at 55 sec despite rate increase earlier.  Goal of Therapy:  APTT 66-103 sec  Heparin level 0.3-0.7 units/mL Monitor platelets by anticoagulation protocol: Yes   Plan:  Increase IV heparin to 2250 units/hr Check 6h aPTT and heparin level  Arrie Senate, PharmD, BCPS, [facility] Clinical Pharmacist [phone] Please check AMION for all [place] numbers 04/02/2021

## 2021-04-02 NOTE — Progress Notes (Signed)
Patient ID: Raymond Allen, male   DOB: 08-28-1960, 60 y.o.   MRN: [id number]     Advanced Heart Failure Rounding Note  PCP-Cardiologist: Dr. Smith/Dr. Haroldine Laws    Subjective:    60/ y/o male with DM2, HTN, former smoker, remote h/o colon cancer -> liver CA (2003) admitted with acute respiratory failure and pulmonary edema. Found to have new onset CM EF 25-30%. Subsequently developed AF with RVR.   Remains intubated/sedated. On ceftriaxone for Strep aglactiae PNA. PCT 118 => 0.22 (?)   He is off NE with stable BP.  JVP 12-13, I/Os positive yesterday.   Converted to NSR on IV amio. On heparin gtt.   Cath 12/16 -> mild CAD. Severe NICM.     Prox RCA to Mid RCA lesion is 40% stenosed.   Prox Cx to Mid Cx lesion is 50% stenosed.   Mid LAD lesion is 30% stenosed.   Findings:   Ao = 61/48 (52) -> NE restarted  LV = 94/27 -> on NE RA =  8 RV = 27/8 PA = 29/12 (22) PCW = 14 Fick cardiac output/index = 6.5/2.7 (prior to NE) PVR = 1.2 WU SVR = 539 Ao sat = 95% PA sat = 66%, 64% PAPi = 2.1   Assessment: 1. Non-obstructive CAD 2. Severe NICM 3. Preserved CO (prior to NE)   Objective:   Weight Range: 124 kg Body mass index is 36.07 kg/m.   Vital Signs:   Temp:  [98.4 F (36.9 C)-99.3 F (37.4 C)] 98.4 F (36.9 C) (12/17 0725) Pulse Rate:  [0-106] 88 (12/17 1121) Resp:  [0-23] 20 (12/17 1121) BP: (81-145)/(48-90) 114/78 (12/17 0745) SpO2:  [0 %-100 %] 96 % (12/17 1121) Arterial Line BP: (94-151)/(53-77) 140/65 (12/17 0915) FiO2 (%):  [40 %] 40 % (12/17 1121) Weight:  [124 kg] 124 kg (12/17 0500) Last BM Date: 03/29/21  Weight change: Filed Weights   03/31/21 0500 04/01/21 0500 04/02/21 0500  Weight: 122.2 kg 123.9 kg 124 kg    Intake/Output:   Intake/Output Summary (Last 24 hours) at 04/02/2021 1205 Last data filed at 04/02/2021 0900 Gross per 24 hour  Intake 1439.81 ml  Output 1870 ml  Net -430.19 ml      Physical Exam    General:  Intubated/sedate.  Neck: JVP 10-12, no thyromegaly or thyroid nodule.  Lungs: decreased dependently CV: Nondisplaced PMI.  Heart regular S1/S2, no S3/S4, no murmur.  No peripheral edema.   Abdomen: Soft, nontender, no hepatosplenomegaly, no distention.  Skin: Intact without lesions or rashes.  Neurologic: Sedated on vent. Extremities: No clubbing or cyanosis.  HEENT: Normal.    Telemetry   Sinus 80s Personally reviewed   Labs    CBC Recent Labs    04/01/21 0353 04/01/21 1634 04/01/21 1641 04/02/21 0412  WBC 8.5  --   --  8.4  HGB 12.7*   < > 12.6* 11.3*  HCT 41.8   < > 37.0* 39.0  MCV 83.1  --   --  83.7  PLT 237  --   --  227   < > = values in this interval not displayed.   Basic Metabolic Panel Recent Labs    03/31/21 0349 04/01/21 0353 04/01/21 1634 04/01/21 1641 04/02/21 0815  NA 138 136   < > 143 138  K 3.6 3.6   < > 3.3* 3.2*  CL 102 99  --   --  99  CO2 30 28  --   --  30  GLUCOSE 200* 232*  --   --  218*  BUN 21 23  --   --  23  CREATININE 1.21 1.12  --   --  1.11  CALCIUM 8.6* 8.7*  --   --  8.8*  MG 2.0  --   --   --  1.8  PHOS 4.5  --   --   --   --    < > = values in this interval not displayed.   Liver Function Tests No results for input(s): AST, ALT, ALKPHOS, BILITOT, PROT, ALBUMIN in the last 72 hours. No results for input(s): LIPASE, AMYLASE in the last 72 hours. Cardiac Enzymes No results for input(s): CKTOTAL, CKMB, CKMBINDEX, TROPONINI in the last 72 hours.  BNP: BNP (last 3 results) Recent Labs    03/29/21 0502  BNP 391.6*    ProBNP (last 3 results) No results for input(s): PROBNP in the last 8760 hours.   D-Dimer No results for input(s): DDIMER in the last 72 hours. Hemoglobin A1C No results for input(s): HGBA1C in the last 72 hours.  Fasting Lipid Panel Recent Labs    04/02/21 0412  TRIG 162*   Thyroid Function Tests Recent Labs    03/31/21 0349  TSH 0.599    Other results:   Imaging    CARDIAC  CATHETERIZATION  Result Date: 04/01/2021   Prox RCA to Mid RCA lesion is 40% stenosed.   Prox Cx to Mid Cx lesion is 50% stenosed.   Mid LAD lesion is 30% stenosed. Findings: Ao = 61/48 (52) -> NE restarted LV = 94/27 -> on NE RA =  8 RV = 27/8 PA = 29/12 (22) PCW = 14 Fick cardiac output/index = 6.5/2.7 (prior to NE) PVR = 1.2 WU SVR = 539 Ao sat = 95% PA sat = 66%, 64% PAPi = 2.1 Assessment: 1. Non-obstructive CAD 2. Severe NICM 3. Preserved CO (prior to NE) Plan/Discussion: He has severe NICM. Low SVR and need to re-initiate NE in setting of preserved CO raises concern for distributive component to his hypotension. Glori Bickers, MD 8:16 PM    Medications:     Scheduled Medications:  chlorhexidine gluconate (MEDLINE KIT)  15 mL Mouth Rinse BID   Chlorhexidine Gluconate Cloth  6 each Topical Daily   digoxin  0.125 mg Per Tube Daily   feeding supplement (PROSource TF)  45 mL Per Tube QID   feeding supplement (VITAL HIGH PROTEIN)  1,000 mL Per Tube Q24H   furosemide  80 mg Intravenous BID   gabapentin  600 mg Per Tube BID WC   gabapentin  900 mg Per Tube QHS   insulin aspart  0-20 Units Subcutaneous Q4H   mouth rinse  15 mL Mouth Rinse 10 times per day   pantoprazole sodium  40 mg Per Tube Daily   potassium chloride  40 mEq Per Tube Q4H   potassium chloride  40 mEq Oral Once   sodium chloride flush  10-40 mL Intracatheter Q12H   sodium chloride flush  3 mL Intravenous Q12H   sodium chloride flush  3 mL Intravenous Q12H   spironolactone  12.5 mg Oral Daily    Infusions:  sodium chloride     sodium chloride     amiodarone 30 mg/hr (04/02/21 0900)   cefTRIAXone (ROCEPHIN)  IV Stopped (04/01/21 1842)   fentaNYL infusion INTRAVENOUS 150 mcg/hr (04/02/21 0900)   heparin 1,850 Units/hr (04/02/21 0900)   magnesium sulfate bolus IVPB     norepinephrine (  LEVOPHED) Adult infusion 0 mcg/min (04/01/21 1800)   propofol (DIPRIVAN) infusion 20 mcg/kg/min (04/02/21 1109)    PRN  Medications: Place/Maintain arterial line **AND** sodium chloride, sodium chloride, acetaminophen (TYLENOL) oral liquid 160 mg/5 mL, acetaminophen, cyclobenzaprine, docusate sodium, fentaNYL, metoprolol tartrate, ondansetron (ZOFRAN) IV, polyethylene glycol, sodium chloride flush, sodium chloride flush   Assessment/Plan   1. Acute Systolic Heart Failure (New)  - Admitted w/ acute pulmonary edema/ hypoxic respiratory failure  - Echo LVEF 25-30%, diffuse HK, RV mildly reduced. No prior study for comparison - Etiology uncertain. H/o chemotherapy w/ Fluoropyrimidines (potentially cardiotoxic) but this was remotely. Off chemo since 2003 - Cath 12/16 minimal CAD. Preserved CO. Low SVR - Cardiac MRI when extubated/stable.  - Off pressors.  Check co-ox.  - CVP 12-13, BP stable, I/Os positive.  Increase Lasix to 80 mg IV bid.  - Add spironolactone 12.5 daily.  - Continue digoxin 0.125   2. Septic Shock  - Afebrile  - PCT 0.32 -> 118 -> 0.22 (?)  - Resolved, stable BP.  - On ceftriaxone for Strep agalactiae.    3. Acute Hypoxic Respiratory Failure - 2/2 acute pulmonary edema and PNA - intubated, vent management per PCCM    4. Mitral Regurgitation  - Mod-severe on echo. No significant v-waves in PCWP tracing on cath - Suspect functional in setting of severely dilated LV  - Doubt this is major driver currently. Can repeat echo prior to d/c and consider TEE as needed   5. 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 Hypokalemia - supp - Supp w/ KCL. Aim to keep > 4.0   7. Type 2DM  - Hgb A1c 7.7  - Consider SGLT2i initiation prior to d/c   8. Atrial Tach vs AFlutter w/ RVR - Now in NSR on IV amio. Continue - Continue heparin  CRITICAL CARE Performed by: Loralie Champagne  Total critical care time: 35 minutes  Critical care time was exclusive of separately billable procedures and treating other patients.  Critical care was necessary to  treat or prevent imminent or life-threatening deterioration.  Critical care was time spent personally by me (independent of midlevel providers or residents) on the following activities: development of treatment plan with patient and/or surrogate as well as nursing, discussions with consultants, evaluation of patient's response to treatment, examination of patient, obtaining history from patient or surrogate, ordering and performing treatments and interventions, ordering and review of laboratory studies, ordering and review of radiographic studies, pulse oximetry and re-evaluation of patient's condition.   Length of Stay: 4  Loralie Champagne, MD  04/02/2021, 12:05 PM  Advanced Heart Failure Team Pager [phone] (M-F; 7a - 5p)  Please contact [facility] for night-coverage after hours (5p -7a ) and weekends on [email or web address]

## 2021-04-03 LAB — APTT: aPTT: 72 seconds — ABNORMAL HIGH (ref 24–36)

## 2021-04-03 LAB — CBC
HCT: 39.7 % (ref 39.0–52.0)
Hemoglobin: 11.4 g/dL — ABNORMAL LOW (ref 13.0–17.0)
MCH: 24.2 pg — ABNORMAL LOW (ref 26.0–34.0)
MCHC: 28.7 g/dL — ABNORMAL LOW (ref 30.0–36.0)
MCV: 84.1 fL (ref 80.0–100.0)
Platelets: 247 10*3/uL (ref 150–400)
RBC: 4.72 MIL/uL (ref 4.22–5.81)
RDW: 15.9 % — ABNORMAL HIGH (ref 11.5–15.5)
WBC: 7.7 10*3/uL (ref 4.0–10.5)
nRBC: 0 % (ref 0.0–0.2)

## 2021-04-03 LAB — BASIC METABOLIC PANEL
Anion gap: 8 (ref 5–15)
BUN: 30 mg/dL — ABNORMAL HIGH (ref 8–23)
CO2: 28 mmol/L (ref 22–32)
Calcium: 8.8 mg/dL — ABNORMAL LOW (ref 8.9–10.3)
Chloride: 103 mmol/L (ref 98–111)
Creatinine, Ser: 1.18 mg/dL (ref 0.61–1.24)
GFR, Estimated: >60 mL/min (ref >60)
Glucose, Bld: 218 mg/dL — ABNORMAL HIGH (ref 70–99)
Potassium: 4 mmol/L (ref 3.5–5.1)
Sodium: 139 mmol/L (ref 135–145)

## 2021-04-03 LAB — GLUCOSE, CAPILLARY
Glucose-Capillary: 226 mg/dL — ABNORMAL HIGH (ref 70–99)
Glucose-Capillary: 182 mg/dL — ABNORMAL HIGH (ref 70–99)
Glucose-Capillary: 206 mg/dL — ABNORMAL HIGH (ref 70–99)
Glucose-Capillary: 193 mg/dL — ABNORMAL HIGH (ref 70–99)
Glucose-Capillary: 184 mg/dL — ABNORMAL HIGH (ref 70–99)
Glucose-Capillary: 198 mg/dL — ABNORMAL HIGH (ref 70–99)

## 2021-04-03 LAB — COOXEMETRY PANEL
Carboxyhemoglobin: 1.4 % (ref 0.5–1.5)
Methemoglobin: 0.7 % (ref 0.0–1.5)
O2 Saturation: 72.1 %
Total hemoglobin: 11.7 g/dL — ABNORMAL LOW (ref 12.0–16.0)

## 2021-04-03 LAB — HEPARIN LEVEL (UNFRACTIONATED): Heparin Unfractionated: 0.62 IU/mL (ref 0.30–0.70)

## 2021-04-03 LAB — MAGNESIUM: Magnesium: 2.1 mg/dL (ref 1.7–2.4)

## 2021-04-03 MED ORDER — RIVAROXABAN 20 MG PO TABS
20.0000 mg | ORAL_TABLET | Freq: Every day | ORAL | Status: DC
  Administered 2021-04-03 – 2021-04-05 (×3): 20 mg
  Filled 2021-04-03 (×3): qty 1

## 2021-04-03 MED ORDER — ROSUVASTATIN CALCIUM 20 MG PO TABS
20.0000 mg | ORAL_TABLET | Freq: Every day | ORAL | Status: DC
  Administered 2021-04-03 – 2021-04-06 (×4): 20 mg
  Filled 2021-04-03 (×4): qty 1

## 2021-04-03 MED ORDER — AMIODARONE HCL 200 MG PO TABS: 400.0000 mg | ORAL_TABLET | Freq: Two times a day (BID) | ORAL | Status: DC

## 2021-04-03 MED ORDER — LOSARTAN POTASSIUM 25 MG PO TABS
12.5000 mg | ORAL_TABLET | Freq: Two times a day (BID) | ORAL | Status: DC
  Administered 2021-04-03 – 2021-04-04 (×4): 12.5 mg via ORAL
  Filled 2021-04-03 (×5): qty 0.5

## 2021-04-03 MED ORDER — POTASSIUM CHLORIDE 20 MEQ PO PACK
40.0000 meq | PACK | Freq: Once | ORAL | Status: AC
  Administered 2021-04-03: 14:00:00 40 meq
  Filled 2021-04-03: qty 2

## 2021-04-03 MED ORDER — AMIODARONE HCL 200 MG PO TABS
400.0000 mg | ORAL_TABLET | Freq: Two times a day (BID) | ORAL | Status: DC
  Administered 2021-04-03 – 2021-04-05 (×4): 400 mg via ORAL
  Filled 2021-04-03 (×4): qty 2

## 2021-04-03 MED ORDER — ROSUVASTATIN CALCIUM 20 MG PO TABS
20.0000 mg | ORAL_TABLET | Freq: Every day | ORAL | Status: DC
Start: 2021-04-03 — End: 2021-04-03

## 2021-04-03 MED ORDER — INSULIN GLARGINE-YFGN 100 UNIT/ML ~~LOC~~ SOLN
15.0000 [IU] | Freq: Every day | SUBCUTANEOUS | Status: DC
  Administered 2021-04-03 – 2021-04-11 (×9): 15 [IU] via SUBCUTANEOUS
  Filled 2021-04-03 (×9): qty 0.15

## 2021-04-03 NOTE — Progress Notes (Signed)
NAME:  Raymond Allen, MRN:  [id number], DOB:  [birth year], LOS: 5 ADMISSION DATE:  03/29/2021, CONSULTATION DATE:  03/29/21 REFERRING MD:  Dr. Jerrol Banana, CHIEF COMPLAINT:  Short of breath   History of Present Illness:  60 yo male former smoker presented with several weeks of dyspnea and edema.  This was associated with cough with clear sputum.  Found to have hypervolemia with pulmonary edema.  Failed Bipap and required intubation.  Pertinent  Medical History  DM type 2, DVT on xarelto, HTN, Colon cancer s/p partial colectomy 2001 with liver mets s/p Lt hepatectomy, CKD 3a, ED  Significant Hospital Events: Including procedures, antibiotic start and stop dates in addition to other pertinent events   12/13 Admit, cardiology consulted, intubated 12/15 febrile 12/16 poor mental status, RHC/LHC with non-obstructive CAD, mean PA elevated with normal PVR, preserved CO 12/17 follows commands  Studies:  Echo 03/29/21 >> EF 25 to 30%, RVSP 38 mmHg, mod LA dilation, mod/severe MR  Interim History / Subjective:  Pressors off. Encephalopathic on low dose propofol. Intermittently follows commands.  Objective   Blood pressure 101/60, pulse 98, temperature 98.1 F (36.7 C), temperature source Axillary, resp. rate (!) 22, height 6\' 1"  (1.854 m), weight 124 kg, SpO2 93 %. CVP:  [1 mmHg-17 mmHg] 12 mmHg  Vent Mode: PRVC FiO2 (%):  [40 %] 40 % Set Rate:  [18 bmp] 18 bmp Vt Set:  [630 mL] 630 mL PEEP:  [5 cmH20] 5 cmH20 Plateau Pressure:  [17 cmH20-24 cmH20] 17 cmH20   Intake/Output Summary (Last 24 hours) at 04/03/2021 0849 Last data filed at 04/03/2021 0800 Gross per 24 hour  Intake 3257.3 ml  Output 3285 ml  Net -27.7 ml    Filed Weights   03/31/21 0500 04/01/21 0500 04/02/21 0500  Weight: 122.2 kg 123.9 kg 124 kg    Examination:  General - ill appearing Eyes - pupils reactive ENT - ETT in place Cardiac - regular, tachycardic Chest - b/l rhonchi Abdomen - soft, non tender, + bowel  sounds Extremities - trace edema Skin - no rashes Neuro - follows commands  Resolved Hospital Problem list     Assessment & Plan:   Acute hypoxic respiratory failure from acute pulmonary edema. - full vent support for now - increase lasix 80 mg IV BID per HF team 12/17 - SBT 12/18, consider t-piece trial prior to extubation given significant cardiomyoplasty  Acute systolic CHF. Mitral regurgitation likely functional in setting of dilated cardiomyopathy. Septic shock (resolved) - heart failure team following - Heart cath with preserved CO, low SVR suggestive of distributive shock - hold outpt norvasc, HCTZ, lisinopril, crestor - Diuresis as above  Atrial arrhythmia: Now NSR after amio drip --transition amio gtt to oral 12/17, continue load 400 mg TID for 10g --hep drip, transition to home xarelto PM 12/18  Fever, pneumonia. - blood culture NGTD, UA clear - Sputum culture Strep agalactiae, CTX x 5 days 12/16 >> - procal 118 - prn tylenol  Hx of DVT. - heparin drip, transition to home xarelto PM 12/18  Hypokalemia. CKD 3a. - f/u BMET  Acute metabolic encephalopathy from hypoxic respiratory failure.: Following commands on lower dose propofol. - RASS goal 0 to -1 - wean propofol as able  DM type 2 with neuropathy. - SSI - hold outpt metformin, ozempic - continue neurontin  Best Practice (right click and "Reselect all SmartList Selections" daily)   Diet/type: tubefeeds DVT prophylaxis: systemic heparin GI prophylaxis: PPI Lines: Central line Foley:  N/A  Code Status:  full code Last date of multidisciplinary goals of care discussion [x]   Labs    CMP Latest Ref Rng & Units 04/03/2021 04/02/2021 04/01/2021  Glucose 70 - 99 mg/dL 218(H) 218(H) -  BUN 8 - 23 mg/dL 30(H) 23 -  Creatinine 0.61 - 1.24 mg/dL 1.18 1.11 -  Sodium 135 - 145 mmol/L 139 138 143  Potassium 3.5 - 5.1 mmol/L 4.0 3.2(L) 3.3(L)  Chloride 98 - 111 mmol/L 103 99 -  CO2 22 - 32 mmol/L 28 30 -   Calcium 8.9 - 10.3 mg/dL 8.8(L) 8.8(L) -  Total Protein 6.5 - 8.1 g/dL - - -  Total Bilirubin 0.3 - 1.2 mg/dL - - -  Alkaline Phos 38 - 126 U/L - - -  AST 15 - 41 U/L - - -  ALT 0 - 44 U/L - - -    CBC Latest Ref Rng & Units 04/03/2021 04/02/2021 04/01/2021  WBC 4.0 - 10.5 K/uL 7.7 8.4 -  Hemoglobin 13.0 - 17.0 g/dL 11.4(L) 11.3(L) 12.6(L)  Hematocrit 39.0 - 52.0 % 39.7 39.0 37.0(L)  Platelets 150 - 400 K/uL 247 227 -    ABG    Component Value Date/Time   PHART 7.474 (H) 04/01/2021 1641   PCO2ART 38.8 04/01/2021 1641   PO2ART 69 (L) 04/01/2021 1641   HCO3 28.5 (H) 04/01/2021 1641   TCO2 30 04/01/2021 1641   ACIDBASEDEF 7.0 (H) 11/29/2007 0320   O2SAT 72.1 04/03/2021 0354    CBG (last 3)  Recent Labs    04/02/21 2322 04/03/21 0316 04/03/21 0736  GLUCAP 216* 226* 182*     Critical care time:    CRITICAL CARE Performed by: Lanier Clam   Total critical care time: 32 minutes  Critical care time was exclusive of separately billable procedures and treating other patients.  Critical care was necessary to treat or prevent imminent or life-threatening deterioration.  Critical care was time spent personally by me on the following activities: development of treatment plan with patient and/or surrogate as well as nursing, discussions with consultants, evaluation of patient's response to treatment, examination of patient, obtaining history from patient or surrogate, ordering and performing treatments and interventions, ordering and review of laboratory studies, ordering and review of radiographic studies, pulse oximetry and re-evaluation of patient's condition.  Lanier Clam, MD [place] for contact info 04/03/2021, 8:49 AM

## 2021-04-03 NOTE — Progress Notes (Signed)
ANTICOAGULATION CONSULT NOTE  Pharmacy Consult:  Heparin Indication: Hx DVT   No Known Allergies  Patient Measurements: Height: 6\' 1"  (185.4 cm) Weight: 124 kg (273 lb 5.9 oz) IBW/kg (Calculated) : 79.9 Heparin Dosing Weight: 109kg  Vital Signs: Temp: 98.1 F (36.7 C) (12/18 0600) Temp Source: Axillary (12/18 0600) Pulse Rate: 87 (12/18 0800)  Labs: Recent Labs    04/01/21 0353 04/01/21 1149 04/01/21 1641 04/02/21 0412 04/02/21 0815 04/02/21 1000 04/02/21 1933 04/03/21 0400 04/03/21 0415  HGB 12.7*   < > 12.6* 11.3*  --   --   --   --  11.4*  HCT 41.8   < > 37.0* 39.0  --   --   --   --  39.7  PLT 237  --   --  227  --   --   --   --  247  APTT 46*   < >  --   --   --  56* 55* 72*  --   HEPARINUNFRC 0.45  --   --   --   --  0.37  --  0.62  --   CREATININE 1.12  --   --   --  1.11  --   --   --  1.18   < > = values in this interval not displayed.     Estimated Creatinine Clearance: 89.5 mL/min (by C-G formula based on SCr of 1.18 mg/dL).   Assessment: 57 YOM with history of DVT on rivaroxaban PTA.  Admitted with SOB and LE edema being treated for heart failure exacerbation.  Patient is s/p cath, found to have non-obstructive CAD with severe NICM.  Pharmacy asked to resume IV heparin post cath.  Will dose heparin based off aPTT for now and await rivaroxaban wash out.   aPTT now therapeutic but not yet correlating with heparin level; no bleeding reported.  Goal of Therapy:  APTT 66-103 sec  Heparin level 0.3-0.7 units/mL Monitor platelets by anticoagulation protocol: Yes   Plan:  Continue heparin gtt at 2250 units/hr Daily heparin level, aPTT and CBC  [name] D. Mina Allen, PharmD, BCPS, [place] 04/03/2021, 8:30 AM

## 2021-04-03 NOTE — Plan of Care (Signed)

## 2021-04-03 NOTE — Progress Notes (Signed)
Patient ID: Raymond Allen, male   DOB: Mar 22, 1961, 60 y.o.   MRN: [id number]     Advanced Heart Failure Rounding Note  PCP-Cardiologist: Dr. Smith/Dr. Haroldine Laws    Subjective:    60/ y/o male with DM2, HTN, former smoker, remote h/o colon cancer -> liver CA (2003) admitted with acute respiratory failure and pulmonary edema. Found to have new onset CM EF 25-30%. Subsequently developed AF with RVR.   Remains intubated/sedated. On ceftriaxone for Strep aglactiae PNA. PCT 118 => 0.22 (?)   BP stable today, CVP 6-7.  Diuresed with IV Lasix yesterday.  Co-ox 72%.  He is off pressors.    Converted to NSR on IV amio. On heparin gtt.   Cath 12/16 -> mild CAD. Severe NICM.     Prox RCA to Mid RCA lesion is 40% stenosed.   Prox Cx to Mid Cx lesion is 50% stenosed.   Mid LAD lesion is 30% stenosed.   Findings:   Ao = 61/48 (52) -> NE restarted  LV = 94/27 -> on NE RA =  8 RV = 27/8 PA = 29/12 (22) PCW = 14 Fick cardiac output/index = 6.5/2.7 (prior to NE) PVR = 1.2 WU SVR = 539 Ao sat = 95% PA sat = 66%, 64% PAPi = 2.1   Assessment: 1. Non-obstructive CAD 2. Severe NICM 3. Preserved CO (prior to NE)   Objective:   Weight Range: 124 kg Body mass index is 36.07 kg/m.   Vital Signs:   Temp:  [98.1 F (36.7 C)-98.9 F (37.2 C)] 98.1 F (36.7 C) (12/18 1141) Pulse Rate:  [81-100] 91 (12/18 0919) Resp:  [13-22] 22 (12/18 0841) BP: (96-136)/(60-72) 136/68 (12/18 1142) SpO2:  [91 %-96 %] 93 % (12/18 0841) Arterial Line BP: (99-136)/(56-75) 136/73 (12/18 0800) FiO2 (%):  [40 %] 40 % (12/18 1142) Last BM Date: 03/29/21  Weight change: Filed Weights   03/31/21 0500 04/01/21 0500 04/02/21 0500  Weight: 122.2 kg 123.9 kg 124 kg    Intake/Output:   Intake/Output Summary (Last 24 hours) at 04/03/2021 1157 Last data filed at 04/03/2021 1113 Gross per 24 hour  Intake 3177.96 ml  Output 4285 ml  Net -1107.04 ml      Physical Exam    General: Intubated/sedated.   Neck: No JVD, no thyromegaly or thyroid nodule.  Lungs: Clear to auscultation bilaterally with normal respiratory effort. CV: Nondisplaced PMI.  Heart regular S1/S2, no S3/S4, no murmur.  No peripheral edema.   Abdomen: Soft, nontender, no hepatosplenomegaly, no distention.  Skin: Intact without lesions or rashes.  Neurologic: Sedated on vent.  Extremities: No clubbing or cyanosis.  HEENT: Normal.    Telemetry   Sinus 80s Personally reviewed   Labs    CBC Recent Labs    04/02/21 0412 04/03/21 0415  WBC 8.4 7.7  HGB 11.3* 11.4*  HCT 39.0 39.7  MCV 83.7 84.1  PLT 227 592   Basic Metabolic Panel Recent Labs    04/02/21 0815 04/03/21 0415  NA 138 139  K 3.2* 4.0  CL 99 103  CO2 30 28  GLUCOSE 218* 218*  BUN 23 30*  CREATININE 1.11 1.18  CALCIUM 8.8* 8.8*  MG 1.8 2.1   Liver Function Tests No results for input(s): AST, ALT, ALKPHOS, BILITOT, PROT, ALBUMIN in the last 72 hours. No results for input(s): LIPASE, AMYLASE in the last 72 hours. Cardiac Enzymes No results for input(s): CKTOTAL, CKMB, CKMBINDEX, TROPONINI in the last 72 hours.  BNP: BNP (  last 3 results) Recent Labs    03/29/21 0502  BNP 391.6*    ProBNP (last 3 results) No results for input(s): PROBNP in the last 8760 hours.   D-Dimer No results for input(s): DDIMER in the last 72 hours. Hemoglobin A1C No results for input(s): HGBA1C in the last 72 hours.  Fasting Lipid Panel Recent Labs    04/02/21 0412  TRIG 162*   Thyroid Function Tests No results for input(s): TSH, T4TOTAL, T3FREE, THYROIDAB in the last 72 hours.  Invalid input(s): FREET3   Other results:   Imaging    No results found.   Medications:     Scheduled Medications:  amiodarone  400 mg Per Tube TID   Followed by   Derrill Memo ON 04/09/2021] amiodarone  400 mg Per Tube Daily   chlorhexidine gluconate (MEDLINE KIT)  15 mL Mouth Rinse BID   Chlorhexidine Gluconate Cloth  6 each Topical Daily   digoxin  0.125  mg Per Tube Daily   feeding supplement (PROSource TF)  45 mL Per Tube QID   feeding supplement (VITAL HIGH PROTEIN)  1,000 mL Per Tube Q24H   gabapentin  600 mg Per Tube BID WC   gabapentin  900 mg Per Tube QHS   insulin aspart  0-20 Units Subcutaneous Q4H   losartan  12.5 mg Oral BID   mouth rinse  15 mL Mouth Rinse 10 times per day   pantoprazole sodium  40 mg Per Tube Daily   sodium chloride flush  10-40 mL Intracatheter Q12H   sodium chloride flush  3 mL Intravenous Q12H   sodium chloride flush  3 mL Intravenous Q12H   spironolactone  12.5 mg Per Tube Daily    Infusions:  sodium chloride     sodium chloride Stopped (04/02/21 1330)   cefTRIAXone (ROCEPHIN)  IV Stopped (04/02/21 1400)   fentaNYL infusion INTRAVENOUS 150 mcg/hr (04/03/21 0800)   heparin 2,250 Units/hr (04/03/21 0800)   propofol (DIPRIVAN) infusion 10 mcg/kg/min (04/03/21 0800)    PRN Medications: Place/Maintain arterial line **AND** sodium chloride, sodium chloride, acetaminophen, cyclobenzaprine, docusate sodium, fentaNYL, metoprolol tartrate, ondansetron (ZOFRAN) IV, polyethylene glycol, sodium chloride flush, sodium chloride flush   Assessment/Plan   1. Acute Systolic Heart Failure (New)  - Admitted w/ acute pulmonary edema/ hypoxic respiratory failure  - Echo LVEF 25-30%, diffuse HK, RV mildly reduced. No prior study for comparison - Etiology uncertain. H/o chemotherapy w/ Fluoropyrimidines (potentially cardiotoxic) but this was remotely. Off chemo since 2003 - Cath 12/16 minimal CAD. Preserved CO. Low SVR - Cardiac MRI when extubated/stable.  - Good co-ox 72%, CVP 6-7.  Can stop IV Lasix.   - Continue spironolactone 12.5 daily.  - Continue digoxin 0.125  - Add losartan 12.5 mg bid, if BP/creatinine stay stable, transition to Entresto.   2. Septic Shock  - Afebrile  - PCT 0.32 -> 118 -> 0.22 (?)  - Resolved, stable BP.  - On ceftriaxone for Strep agalactiae.    3. Acute Hypoxic Respiratory  Failure - 2/2 acute pulmonary edema and PNA - intubated, vent management per PCCM    4. Mitral Regurgitation  - Mod-severe on echo. No significant v-waves in PCWP tracing on cath - Suspect functional in setting of severely dilated LV  - Doubt this is major driver currently. Can repeat echo prior to d/c and consider TEE as needed   5. 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  Hypokalemia - supp - Supp w/ KCL. Aim to keep > 4.0   7. Type 2DM  - Hgb A1c 7.7  - Consider SGLT2i initiation prior to d/c   8. Atrial Tach vs AFlutter w/ RVR - Can transition to amiodarone po.  - Continue heparin  CRITICAL CARE Performed by: Loralie Champagne  Total critical care time: 35 minutes  Critical care time was exclusive of separately billable procedures and treating other patients.  Critical care was necessary to treat or prevent imminent or life-threatening deterioration.  Critical care was time spent personally by me (independent of midlevel providers or residents) on the following activities: development of treatment plan with patient and/or surrogate as well as nursing, discussions with consultants, evaluation of patient's response to treatment, examination of patient, obtaining history from patient or surrogate, ordering and performing treatments and interventions, ordering and review of laboratory studies, ordering and review of radiographic studies, pulse oximetry and re-evaluation of patient's condition.   Length of Stay: [place], MD  04/03/2021, 11:57 AM  Advanced Heart Failure Team Pager [phone] (M-F; 7a - 5p)  Please contact [facility] for night-coverage after hours (5p -7a ) and weekends on [email or web address]

## 2021-04-04 ENCOUNTER — Encounter (HOSPITAL_COMMUNITY): Payer: Self-pay | Admitting: Internal Medicine

## 2021-04-04 LAB — CBC
HCT: 39.3 % (ref 39.0–52.0)
Hemoglobin: 11.5 g/dL — ABNORMAL LOW (ref 13.0–17.0)
MCH: 24.8 pg — ABNORMAL LOW (ref 26.0–34.0)
MCHC: 29.3 g/dL — ABNORMAL LOW (ref 30.0–36.0)
MCV: 84.7 fL (ref 80.0–100.0)
Platelets: 244 10*3/uL (ref 150–400)
RBC: 4.64 MIL/uL (ref 4.22–5.81)
RDW: 16 % — ABNORMAL HIGH (ref 11.5–15.5)
WBC: 7.8 10*3/uL (ref 4.0–10.5)
nRBC: 0 % (ref 0.0–0.2)

## 2021-04-04 LAB — BASIC METABOLIC PANEL
Anion gap: 6 (ref 5–15)
BUN: 35 mg/dL — ABNORMAL HIGH (ref 8–23)
CO2: 30 mmol/L (ref 22–32)
Calcium: 9 mg/dL (ref 8.9–10.3)
Chloride: 103 mmol/L (ref 98–111)
Creatinine, Ser: 1.12 mg/dL (ref 0.61–1.24)
GFR, Estimated: >60 mL/min (ref >60)
Glucose, Bld: 212 mg/dL — ABNORMAL HIGH (ref 70–99)
Potassium: 4 mmol/L (ref 3.5–5.1)
Sodium: 139 mmol/L (ref 135–145)

## 2021-04-04 LAB — COOXEMETRY PANEL
Carboxyhemoglobin: 1.2 % (ref 0.5–1.5)
Methemoglobin: 0.7 % (ref 0.0–1.5)
O2 Saturation: 81.8 %
Total hemoglobin: 11.6 g/dL — ABNORMAL LOW (ref 12.0–16.0)

## 2021-04-04 LAB — GLUCOSE, CAPILLARY
Glucose-Capillary: 167 mg/dL — ABNORMAL HIGH (ref 70–99)
Glucose-Capillary: 171 mg/dL — ABNORMAL HIGH (ref 70–99)
Glucose-Capillary: 191 mg/dL — ABNORMAL HIGH (ref 70–99)
Glucose-Capillary: 198 mg/dL — ABNORMAL HIGH (ref 70–99)
Glucose-Capillary: 211 mg/dL — ABNORMAL HIGH (ref 70–99)
Glucose-Capillary: 237 mg/dL — ABNORMAL HIGH (ref 70–99)

## 2021-04-04 MED ORDER — DOCUSATE SODIUM 50 MG/5ML PO LIQD
100.0000 mg | Freq: Two times a day (BID) | ORAL | Status: DC
Start: 2021-04-04 — End: 2021-04-06
  Administered 2021-04-04 – 2021-04-06 (×4): 100 mg
  Filled 2021-04-04 (×4): qty 10

## 2021-04-04 MED ORDER — INSULIN ASPART 100 UNIT/ML IJ SOLN
3.0000 [IU] | INTRAMUSCULAR | Status: DC
Start: 2021-04-04 — End: 2021-04-05
  Administered 2021-04-04 – 2021-04-05 (×6): 3 [IU] via SUBCUTANEOUS

## 2021-04-04 MED ORDER — POLYETHYLENE GLYCOL 3350 17 G PO PACK
17.0000 g | PACK | Freq: Every day | ORAL | Status: DC
  Administered 2021-04-04 – 2021-04-05 (×2): 17 g
  Filled 2021-04-04: qty 1

## 2021-04-04 MED ORDER — VITAL HIGH PROTEIN PO LIQD
1000.0000 mL | ORAL | Status: DC
  Administered 2021-04-04 – 2021-04-05 (×2): 1000 mL
  Filled 2021-04-04 (×2): qty 1000

## 2021-04-04 NOTE — Progress Notes (Signed)
Patient ID: Raymond Allen, male   DOB: 1960/10/26, 60 y.o.   MRN: [id number]     Advanced Heart Failure Rounding Note  PCP-Cardiologist: Dr. Smith/Dr. Haroldine Laws    Subjective:    60/ y/o male with DM2, HTN, former smoker, remote h/o colon cancer -> liver CA (2003) admitted with acute respiratory failure and pulmonary edema. Found to have new onset CM EF 25-30%. Subsequently developed AF with RVR.   Remains intubated/sedated. On ceftriaxone for Strep aglactiae PNA. PCT 118 => 0.22 (?)   Off pressors. More awake today. Will wiggle his toes on command. Seems weaker on right.   Remains in NSR. On heparin. Co-ox 82% CVP 10-11   Objective:   Weight Range: 123.7 kg Body mass index is 35.98 kg/m.   Vital Signs:   Temp:  [97.9 F (36.6 C)-99.6 F (37.6 C)] 99.6 F (37.6 C) (12/19 2010) Pulse Rate:  [77-110] 88 (12/19 2230) Resp:  [14-24] 18 (12/19 2230) BP: (81-137)/(60-96) 102/79 (12/19 2200) SpO2:  [94 %-97 %] 95 % (12/19 2230) Arterial Line BP: (88-161)/(47-88) 139/70 (12/19 2230) FiO2 (%):  [40 %] 40 % (12/19 2200) Weight:  [123.7 kg] 123.7 kg (12/19 0400) Last BM Date: 03/29/21  Weight change: Filed Weights   04/01/21 0500 04/02/21 0500 04/04/21 0400  Weight: 123.9 kg 124 kg 123.7 kg    Intake/Output:   Intake/Output Summary (Last 24 hours) at 04/04/2021 2306 Last data filed at 04/04/2021 2200 Gross per 24 hour  Intake 1667.32 ml  Output 1725 ml  Net -57.68 ml       Physical Exam    General:  Intubated/sedated but will awaken some. Wiggles toes for me on command HEENT: normal + ETT Neck: supple. JVP 10-11 Carotids 2+ bilat; no bruits. No lymphadenopathy or thryomegaly appreciated. Cor: PMI nondisplaced. Regular rate & rhythm. No rubs, gallops or murmurs. Lungs: clear Abdomen: soft, nontender, nondistended. No hepatosplenomegaly. No bruits or masses. Good bowel sounds. Extremities: no cyanosis, clubbing, rash, 1+ edema Neuro: as above. Seems weak on  R  Telemetry   Sinus 80-90s Personally reviewed    Labs    CBC Recent Labs    04/03/21 0415 04/04/21 0256  WBC 7.7 7.8  HGB 11.4* 11.5*  HCT 39.7 39.3  MCV 84.1 84.7  PLT 247 832    Basic Metabolic Panel Recent Labs    04/02/21 0815 04/03/21 0415 04/04/21 0256  NA 138 139 139  K 3.2* 4.0 4.0  CL 99 103 103  CO2 '30 28 30  ' GLUCOSE 218* 218* 212*  BUN 23 30* 35*  CREATININE 1.11 1.18 1.12  CALCIUM 8.8* 8.8* 9.0  MG 1.8 2.1  --     Liver Function Tests No results for input(s): AST, ALT, ALKPHOS, BILITOT, PROT, ALBUMIN in the last 72 hours. No results for input(s): LIPASE, AMYLASE in the last 72 hours. Cardiac Enzymes No results for input(s): CKTOTAL, CKMB, CKMBINDEX, TROPONINI in the last 72 hours.  BNP: BNP (last 3 results) Recent Labs    03/29/21 0502  BNP 391.6*     ProBNP (last 3 results) No results for input(s): PROBNP in the last 8760 hours.   D-Dimer No results for input(s): DDIMER in the last 72 hours. Hemoglobin A1C No results for input(s): HGBA1C in the last 72 hours.  Fasting Lipid Panel Recent Labs    04/02/21 0412  TRIG 162*    Thyroid Function Tests No results for input(s): TSH, T4TOTAL, T3FREE, THYROIDAB in the last 72 hours.  Invalid input(s):  FREET3   Other results:   Imaging    No results found.   Medications:     Scheduled Medications:  amiodarone  400 mg Oral BID   chlorhexidine gluconate (MEDLINE KIT)  15 mL Mouth Rinse BID   Chlorhexidine Gluconate Cloth  6 each Topical Daily   digoxin  0.125 mg Per Tube Daily   docusate  100 mg Per Tube BID   feeding supplement (PROSource TF)  45 mL Per Tube QID   gabapentin  600 mg Per Tube BID WC   gabapentin  900 mg Per Tube QHS   insulin aspart  0-20 Units Subcutaneous Q4H   insulin aspart  3 Units Subcutaneous Q4H   insulin glargine-yfgn  15 Units Subcutaneous Daily   losartan  12.5 mg Oral BID   mouth rinse  15 mL Mouth Rinse 10 times per day   pantoprazole  sodium  40 mg Per Tube Daily   polyethylene glycol  17 g Per Tube Daily   rivaroxaban  20 mg Per Tube Q supper   rosuvastatin  20 mg Per Tube Daily   sodium chloride flush  10-40 mL Intracatheter Q12H   sodium chloride flush  3 mL Intravenous Q12H   sodium chloride flush  3 mL Intravenous Q12H   spironolactone  12.5 mg Per Tube Daily    Infusions:  sodium chloride     sodium chloride Stopped (04/02/21 1330)   feeding supplement (VITAL HIGH PROTEIN) 1,000 mL (04/04/21 0954)   fentaNYL infusion INTRAVENOUS 150 mcg/hr (04/04/21 2200)   propofol (DIPRIVAN) infusion 15 mcg/kg/min (04/04/21 2200)    PRN Medications: Place/Maintain arterial line **AND** sodium chloride, sodium chloride, acetaminophen, cyclobenzaprine, docusate sodium, fentaNYL, metoprolol tartrate, ondansetron (ZOFRAN) IV, polyethylene glycol, sodium chloride flush, sodium chloride flush   Assessment/Plan   1. Acute Systolic Heart Failure (New)  - Admitted w/ acute pulmonary edema/ hypoxic respiratory failure  - Echo LVEF 25-30%, diffuse HK, RV mildly reduced. No prior study for comparison - Etiology uncertain. H/o chemotherapy w/ Fluoropyrimidines (potentially cardiotoxic) but this was remotely. Off chemo since 2003 - Cath 12/16 minimal CAD. Preserved CO. Low SVR - Cardiac MRI when extubated/stable.  - Good co-ox 82%, CVP 10-11.  Will resume IV lasix tomorrow to help facilitate vent wean   - Continue spironolactone 12.5 daily.  - Continue digoxin 0.125  - Continue losartan 12.5 mg bid, if BP/creatinine stay stable, transition to Entresto.  - No b-blocker yet  2. Septic Shock  - Afebrile  - PCT 0.32 -> 118 -> 0.22 (?)  - Resolved, stable BP.  - On ceftriaxone for Strep agalactiae.  - No change   3. Acute Hypoxic Respiratory Failure - 2/2 acute pulmonary edema and PNA - intubated, vent management per PCCM  - Continue to wean   4. Mitral Regurgitation  - Mod-severe on echo. No significant v-waves in PCWP  tracing on cath - Suspect functional in setting of severely dilated LV  - Doubt this is major driver currently. Can repeat echo prior to d/c and consider TEE as needed   5. 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 Hypokalemia -  4.0 - Supp as needed. Aim to keep > 4.0   7. Type 2DM  - Hgb A1c 7.7  - Consider SGLT2i initiation prior to d/c   8. Atrial Tach vs AFlutter w/ RVR - On po amio - Continue heparin  CRITICAL CARE Performed by: Quillian Quince  Connelly [place]  Total critical care time: 35 minutes  Critical care time was exclusive of separately billable procedures and treating other patients.  Critical care was necessary to treat or prevent imminent or life-threatening deterioration.  Critical care was time spent personally by me (independent of midlevel providers or residents) on the following activities: development of treatment plan with patient and/or surrogate as well as nursing, discussions with consultants, evaluation of patient's response to treatment, examination of patient, obtaining history from patient or surrogate, ordering and performing treatments and interventions, ordering and review of laboratory studies, ordering and review of radiographic studies, pulse oximetry and re-evaluation of patient's condition.   Length of Stay: [place], MD  04/04/2021, 11:06 PM  Advanced Heart Failure Team Pager [phone] (M-F; 7a - 5p)  Please contact [facility] for night-coverage after hours (5p -7a ) and weekends on [email or web address]

## 2021-04-04 NOTE — Progress Notes (Signed)
NAME:  Raymond Allen, MRN:  [id number], DOB:  Jun 28, 1960, LOS: 6 ADMISSION DATE:  03/29/2021, CONSULTATION DATE:  03/29/21 REFERRING MD:  Dr. Jerrol Banana, CHIEF COMPLAINT:  Short of breath   History of Present Illness:  60 yo male former smoker presented with several weeks of dyspnea and edema.  This was associated with cough with clear sputum.  Found to have hypervolemia with pulmonary edema.  Failed Bipap and required intubation.  Pertinent  Medical History  DM type 2, DVT on xarelto, HTN, Colon cancer s/p partial colectomy 2001 with liver mets s/p Lt hepatectomy, CKD 3a, ED  Significant Hospital Events: Including procedures, antibiotic start and stop dates in addition to other pertinent events   12/13 Admit, cardiology consulted, intubated 12/15 febrile 12/16 poor mental status, RHC/LHC with non-obstructive CAD, mean PA elevated with normal PVR, preserved CO 12/17 follows commands  Studies:  Echo 03/29/21 >> EF 25 to 30%, RVSP 38 mmHg, mod LA dilation, mod/severe MR  Interim History / Subjective:  Following commands, tachypneic on PSV, when directed gets 1L tidal volumes but at times tales rapid shallow breaths,  Objective   Blood pressure 113/71, pulse 88, temperature 98.4 F (36.9 C), temperature source Oral, resp. rate 18, height 6\' 1"  (1.854 m), weight 123.7 kg, SpO2 95 %. CVP:  [1 mmHg-24 mmHg] 9 mmHg  Vent Mode: PRVC FiO2 (%):  [40 %] 40 % Set Rate:  [18 bmp] 18 bmp Vt Set:  [630 mL] 630 mL PEEP:  [5 cmH20] 5 cmH20 Plateau Pressure:  [18 cmH20-29 cmH20] 29 cmH20   Intake/Output Summary (Last 24 hours) at 04/04/2021 1006 Last data filed at 04/04/2021 1000 Gross per 24 hour  Intake 2035.21 ml  Output 1975 ml  Net 60.21 ml    Filed Weights   04/01/21 0500 04/02/21 0500 04/04/21 0400  Weight: 123.9 kg 124 kg 123.7 kg    Examination:  General - ill appearing Eyes - pupils reactive ENT - ETT in place Cardiac - regular, tachycardic Chest - b/l rhonchi Abdomen -  soft, non tender, + bowel sounds Extremities - trace edema Skin - no rashes Neuro - follows commands, alert  Resolved Hospital Problem list     Assessment & Plan:   Acute hypoxic respiratory failure from acute pulmonary edema. - full vent support for now - IV lasix stopped 12/18 per HF team, consider resumption pending respiratory staus which is not good - Check CVP - SBT 12/18, consider t-piece trial prior to extubation given significant cardiomyoplasty  Acute systolic CHF. Mitral regurgitation likely functional in setting of dilated cardiomyopathy. Septic shock (resolved) - heart failure team following - Heart cath with preserved CO, low SVR suggestive of distributive shock - hold outpt norvasc, HCTZ, lisinopril, crestor - Diuresis as above  Atrial arrhythmia: Now NSR after amio drip --transition amio gtt to oral 12/17, continue load 400 mg TID for 10g --home xarelto PM 12/18  Fever, pneumonia. - blood culture NGTD, UA clear - Sputum culture Strep agalactiae, CTX x 5 days 12/16 >> - procal 118 - prn tylenol  Hx of DVT. -  home xarelto PM 12/18  Hypokalemia. CKD 3a. - Cr stable  Acute metabolic encephalopathy from hypoxic respiratory failure.: Following commands on lower dose propofol. - RASS goal 0 to -1 - wean propofol as able  DM type 2 with neuropathy. - SSI - hold outpt metformin, ozempic - continue neurontin  Best Practice (right click and "Reselect all SmartList Selections" daily)   Diet/type: tubefeeds DVT prophylaxis: systemic  heparin GI prophylaxis: PPI Lines: Central line Foley:  N/A Code Status:  full code Last date of multidisciplinary goals of care discussion [x]   Labs    CMP Latest Ref Rng & Units 04/04/2021 04/03/2021 04/02/2021  Glucose 70 - 99 mg/dL 212(H) 218(H) 218(H)  BUN 8 - 23 mg/dL 35(H) 30(H) 23  Creatinine 0.61 - 1.24 mg/dL 1.12 1.18 1.11  Sodium 135 - 145 mmol/L 139 139 138  Potassium 3.5 - 5.1 mmol/L 4.0 4.0 3.2(L)   Chloride 98 - 111 mmol/L 103 103 99  CO2 22 - 32 mmol/L 30 28 30   Calcium 8.9 - 10.3 mg/dL 9.0 8.8(L) 8.8(L)  Total Protein 6.5 - 8.1 g/dL - - -  Total Bilirubin 0.3 - 1.2 mg/dL - - -  Alkaline Phos 38 - 126 U/L - - -  AST 15 - 41 U/L - - -  ALT 0 - 44 U/L - - -    CBC Latest Ref Rng & Units 04/04/2021 04/03/2021 04/02/2021  WBC 4.0 - 10.5 K/uL 7.8 7.7 8.4  Hemoglobin 13.0 - 17.0 g/dL 11.5(L) 11.4(L) 11.3(L)  Hematocrit 39.0 - 52.0 % 39.3 39.7 39.0  Platelets 150 - 400 K/uL 244 247 227    ABG    Component Value Date/Time   PHART 7.474 (H) 04/01/2021 1641   PCO2ART 38.8 04/01/2021 1641   PO2ART 69 (L) 04/01/2021 1641   HCO3 28.5 (H) 04/01/2021 1641   TCO2 30 04/01/2021 1641   ACIDBASEDEF 7.0 (H) 11/29/2007 0320   O2SAT 81.8 04/04/2021 0256    CBG (last 3)  Recent Labs    04/03/21 2325 04/04/21 0304 04/04/21 0727  GLUCAP 198* 237* 171*     Critical care time:    CRITICAL CARE Performed by: Lanier Clam   Total critical care time: 33 minutes  Critical care time was exclusive of separately billable procedures and treating other patients.  Critical care was necessary to treat or prevent imminent or life-threatening deterioration.  Critical care was time spent personally by me on the following activities: development of treatment plan with patient and/or surrogate as well as nursing, discussions with consultants, evaluation of patient's response to treatment, examination of patient, obtaining history from patient or surrogate, ordering and performing treatments and interventions, ordering and review of laboratory studies, ordering and review of radiographic studies, pulse oximetry and re-evaluation of patient's condition.  Lanier Clam, MD [place] for contact info 04/04/2021, 10:06 AM

## 2021-04-05 ENCOUNTER — Inpatient Hospital Stay (HOSPITAL_COMMUNITY): Payer: Medicare HMO

## 2021-04-05 DIAGNOSIS — J811 Chronic pulmonary edema: Secondary | ICD-10-CM

## 2021-04-05 DIAGNOSIS — I498 Other specified cardiac arrhythmias: Secondary | ICD-10-CM

## 2021-04-05 DIAGNOSIS — I5021 Acute systolic (congestive) heart failure: Secondary | ICD-10-CM | POA: Insufficient documentation

## 2021-04-05 DIAGNOSIS — N183 Chronic kidney disease, stage 3 unspecified: Secondary | ICD-10-CM

## 2021-04-05 DIAGNOSIS — G9341 Metabolic encephalopathy: Secondary | ICD-10-CM

## 2021-04-05 DIAGNOSIS — N1831 Chronic kidney disease, stage 3a: Secondary | ICD-10-CM

## 2021-04-05 LAB — CBC WITH DIFFERENTIAL/PLATELET
Abs Immature Granulocytes: 0.07 10*3/uL (ref 0.00–0.07)
Basophils Absolute: 0 10*3/uL (ref 0.0–0.1)
Basophils Relative: 1 %
Eosinophils Absolute: 0.3 10*3/uL (ref 0.0–0.5)
Eosinophils Relative: 4 %
HCT: 37.9 % — ABNORMAL LOW (ref 39.0–52.0)
Hemoglobin: 11.3 g/dL — ABNORMAL LOW (ref 13.0–17.0)
Immature Granulocytes: 1 %
Lymphocytes Relative: 18 %
Lymphs Abs: 1.2 10*3/uL (ref 0.7–4.0)
MCH: 24.9 pg — ABNORMAL LOW (ref 26.0–34.0)
MCHC: 29.8 g/dL — ABNORMAL LOW (ref 30.0–36.0)
MCV: 83.7 fL (ref 80.0–100.0)
Monocytes Absolute: 0.6 10*3/uL (ref 0.1–1.0)
Monocytes Relative: 10 %
Neutro Abs: 4.4 10*3/uL (ref 1.7–7.7)
Neutrophils Relative %: 66 %
Platelets: 269 10*3/uL (ref 150–400)
RBC: 4.53 MIL/uL (ref 4.22–5.81)
RDW: 16.2 % — ABNORMAL HIGH (ref 11.5–15.5)
WBC: 6.6 10*3/uL (ref 4.0–10.5)
nRBC: 0 % (ref 0.0–0.2)

## 2021-04-05 LAB — COOXEMETRY PANEL
Carboxyhemoglobin: 1.5 % (ref 0.5–1.5)
Carboxyhemoglobin: 1.2 % (ref 0.5–1.5)
Methemoglobin: 0.5 % (ref 0.0–1.5)
Methemoglobin: 0.7 % (ref 0.0–1.5)
O2 Saturation: 98.1 %
O2 Saturation: 70.5 %
Total hemoglobin: 11.3 g/dL — ABNORMAL LOW (ref 12.0–16.0)
Total hemoglobin: 13.3 g/dL (ref 12.0–16.0)

## 2021-04-05 LAB — CULTURE, BLOOD (ROUTINE X 2)
Culture: NO GROWTH
Culture: NO GROWTH
Special Requests: ADEQUATE
Special Requests: ADEQUATE

## 2021-04-05 LAB — GLUCOSE, CAPILLARY
Glucose-Capillary: 217 mg/dL — ABNORMAL HIGH (ref 70–99)
Glucose-Capillary: 164 mg/dL — ABNORMAL HIGH (ref 70–99)
Glucose-Capillary: 194 mg/dL — ABNORMAL HIGH (ref 70–99)
Glucose-Capillary: 195 mg/dL — ABNORMAL HIGH (ref 70–99)
Glucose-Capillary: 147 mg/dL — ABNORMAL HIGH (ref 70–99)
Glucose-Capillary: 147 mg/dL — ABNORMAL HIGH (ref 70–99)

## 2021-04-05 LAB — MAGNESIUM: Magnesium: 2.3 mg/dL (ref 1.7–2.4)

## 2021-04-05 LAB — BASIC METABOLIC PANEL
Anion gap: 6 (ref 5–15)
BUN: 38 mg/dL — ABNORMAL HIGH (ref 8–23)
CO2: 27 mmol/L (ref 22–32)
Calcium: 9 mg/dL (ref 8.9–10.3)
Chloride: 106 mmol/L (ref 98–111)
Creatinine, Ser: 1.18 mg/dL (ref 0.61–1.24)
GFR, Estimated: >60 mL/min (ref >60)
Glucose, Bld: 228 mg/dL — ABNORMAL HIGH (ref 70–99)
Potassium: 4 mmol/L (ref 3.5–5.1)
Sodium: 139 mmol/L (ref 135–145)

## 2021-04-05 LAB — DIGOXIN LEVEL: Digoxin Level: 0.3 ng/mL — ABNORMAL LOW (ref 0.8–2.0)

## 2021-04-05 LAB — TRIGLYCERIDES: Triglycerides: 123 mg/dL (ref <150)

## 2021-04-05 MED ORDER — SODIUM CHLORIDE 0.9 % IV SOLN
1.0000 g | Freq: Once | INTRAVENOUS | Status: AC
  Administered 2021-04-05: 13:00:00 1 g via INTRAVENOUS
  Filled 2021-04-05: qty 10

## 2021-04-05 MED ORDER — QUETIAPINE FUMARATE 50 MG PO TABS: 50.0000 mg | ORAL_TABLET | Freq: Two times a day (BID) | ORAL | Status: DC

## 2021-04-05 MED ORDER — DEXTROSE 10 % IV SOLN: INTRAVENOUS | Status: DC

## 2021-04-05 MED ORDER — FENTANYL CITRATE (PF) 100 MCG/2ML IJ SOLN: 12.5000 ug | INTRAMUSCULAR | Status: DC | PRN

## 2021-04-05 MED ORDER — ACETAMINOPHEN 325 MG PO TABS: 650.0000 mg | ORAL_TABLET | ORAL | Status: DC | PRN

## 2021-04-05 MED ORDER — POTASSIUM CHLORIDE 20 MEQ PO PACK
40.0000 meq | PACK | Freq: Once | ORAL | Status: AC
  Administered 2021-04-05: 13:00:00 40 meq
  Filled 2021-04-05: qty 2

## 2021-04-05 MED ORDER — CYCLOBENZAPRINE HCL 10 MG PO TABS
10.0000 mg | ORAL_TABLET | Freq: Three times a day (TID) | ORAL | Status: DC | PRN
  Administered 2021-04-05: 10:00:00 10 mg
  Filled 2021-04-05: qty 1

## 2021-04-05 MED ORDER — POTASSIUM CHLORIDE CRYS ER 20 MEQ PO TBCR: 40.0000 meq | EXTENDED_RELEASE_TABLET | Freq: Once | ORAL | Status: DC

## 2021-04-05 MED ORDER — INSULIN ASPART 100 UNIT/ML IJ SOLN
3.0000 [IU] | INTRAMUSCULAR | Status: DC
  Administered 2021-04-05 – 2021-04-06 (×5): 3 [IU] via SUBCUTANEOUS

## 2021-04-05 MED ORDER — FUROSEMIDE 10 MG/ML IJ SOLN
80.0000 mg | Freq: Once | INTRAMUSCULAR | Status: AC
  Administered 2021-04-05: 11:00:00 80 mg via INTRAVENOUS
  Filled 2021-04-05: qty 8

## 2021-04-05 MED ORDER — AMIODARONE HCL 200 MG PO TABS
400.0000 mg | ORAL_TABLET | Freq: Two times a day (BID) | ORAL | Status: DC
  Administered 2021-04-06: 11:00:00 400 mg
  Filled 2021-04-05: qty 2

## 2021-04-05 MED ORDER — PROSOURCE TF PO LIQD
45.0000 mL | Freq: Four times a day (QID) | ORAL | Status: DC
  Administered 2021-04-05 (×2): 45 mL
  Filled 2021-04-05 (×2): qty 45

## 2021-04-05 MED ORDER — POLYETHYLENE GLYCOL 3350 17 G PO PACK
17.0000 g | PACK | Freq: Every day | ORAL | Status: DC | PRN
  Filled 2021-04-05: qty 1

## 2021-04-05 MED ORDER — VITAL HIGH PROTEIN PO LIQD
1000.0000 mL | ORAL | Status: DC
  Administered 2021-04-05: 12:00:00 1000 mL

## 2021-04-05 MED ORDER — LOSARTAN POTASSIUM 25 MG PO TABS
12.5000 mg | ORAL_TABLET | Freq: Two times a day (BID) | ORAL | Status: DC
  Administered 2021-04-05 – 2021-04-06 (×2): 12.5 mg
  Filled 2021-04-05 (×3): qty 0.5

## 2021-04-05 MED ORDER — DEXMEDETOMIDINE HCL IN NACL 400 MCG/100ML IV SOLN
0.0000 ug/kg/h | INTRAVENOUS | Status: DC
  Administered 2021-04-05: 11:00:00 0.4 ug/kg/h via INTRAVENOUS
  Administered 2021-04-05: 14:00:00 0.7 ug/kg/h via INTRAVENOUS
  Filled 2021-04-05: qty 100

## 2021-04-05 NOTE — Progress Notes (Signed)
Extubated Fairly agitated and confused.  Hitting at staff.  WOB acceptable w/ pulse on mid 90s on [place].  He would not tolerate NIPPV. I'm not sure how much of this is all delirium OR if this is more from WOB off vent.  Plan Will add precedex Add low dose seroquel  May need to be re-intubated. Will watch closely   Erick Colace ACNP-BC [place] Pager # [phone] OR # [phone] if no answer

## 2021-04-05 NOTE — Progress Notes (Signed)
Patient vomiting large amount of yellow fluid. Tube feeding on hold at this time. Patients abd distended. No BM noted since 03/29/2021. Patient did state no tenderness upon palpation during earlier assessment. Patient states he is not passing gas. Zofran given. Awaiting E-Link call back. Will continue to monitor at this time.

## 2021-04-05 NOTE — Progress Notes (Addendum)
NAME:  Raymond Allen, MRN:  [id number], DOB:  04-Dec-1960, LOS: 7 ADMISSION DATE:  03/29/2021, CONSULTATION DATE:  03/29/21 REFERRING MD:  Dr. Jerrol Banana, CHIEF COMPLAINT:  Short of breath   History of Present Illness:  60 yo male former smoker presented with several weeks of dyspnea and edema.  This was associated with cough with clear sputum.  Found to have hypervolemia with pulmonary edema.  Failed Bipap and required intubation.  Pertinent  Medical History  DM type 2, DVT on xarelto, HTN, Colon cancer s/p partial colectomy 2001 with liver mets s/p Lt hepatectomy, CKD 3a, ED  Significant Hospital Events: Including procedures, antibiotic start and stop dates in addition to other pertinent events   12/13 Admit, cardiology consulted, intubated. Echo 03/29/21 >> EF 25 to 30%, RVSP 38 mmHg, mod LA dilation, mod/severe MR 12/15 febrile 12/16 poor mental status, RHC/LHC with non-obstructive CAD, mean PA elevated with normal PVR, preserved CO. Heart cath with preserved CO, low SVR suggestive of distributive shock 12/17 follows commands 12/19 completed ceftriaxone. Off all pressors  12/20 weaning. Working towards extubation  Interim History / Subjective:  Wants off vent   Objective   Blood pressure 104/76, pulse 76, temperature 98 F (36.7 C), temperature source Oral, resp. rate 17, height 6\' 1"  (1.854 m), weight 125.7 kg, SpO2 98 %. CVP:  [3 mmHg-33 mmHg] 11 mmHg  Vent Mode: PRVC FiO2 (%):  [40 %] 40 % Set Rate:  [18 bmp] 18 bmp Vt Set:  [630 mL] 630 mL PEEP:  [5 cmH20] 5 cmH20 Plateau Pressure:  [20 cmH20-27 cmH20] 20 cmH20   Intake/Output Summary (Last 24 hours) at 04/05/2021 0925 Last data filed at 04/05/2021 0240 Gross per 24 hour  Intake 2013.52 ml  Output 1625 ml  Net 388.52 ml   Filed Weights   04/02/21 0500 04/04/21 0400 04/05/21 0500  Weight: 124 kg 123.7 kg 125.7 kg    Examination: General this is a 60 year old male. He is currently on SBT. Wants to come off vent HENT  NCAT orally intubated. Left IJ CVL unremarkable  Card RRR Pulm scattered rhonchi/coarse breath sounds. On PSV: 5/peep 5 VTs 500s rr 20s Card RRR Abd soft  Ext chronic LE edema pulses palp warm  Gu cl yellow  Neuro restless. Does follow commands I do not see focal motor def    Resolved Hospital Problem list   Strep agalactiae Pneumonia completed 5 days ceftriaxone on 12/19  Septic shock (resolved)  Assessment & Plan:   Acute hypoxic respiratory failure from acute pulmonary edema. Looks comfortable on SBT Got lasix this am. I&O balance -4.9 Plan Cont SBT and reassess. Hope extubate soon (today or next day or so)  IV lasix VAP bundle  PAD protocol (DC sedation prior to extubation) IF fatigues during SBT trial transition to precedex and PRN fent Will need PRN bipap   Acute systolic CHF EF 97-35% Mitral regurgitation likely functional in setting of dilated cardiomyopathy. - heart failure team following, no longer in shock  Plan Cont dig Cont Spironolactone  Cont Losartan w/ plan to transition to [facility] if BP/cr tolerates eventually Eventual cardia MRI after extubation Cont tele   Atrial arrhythmia: Now NSR after amio drip Plan  Cont VT amiodarone Cont DOAC   Hx of DVT. Plan Cont xarelto    CKD 3a. - Cr stable Plan Renal adjust meds Strict I&O Am chem   Acute metabolic encephalopathy from hypoxic respiratory failure. Plan RASS goal 0 Dc prop and fent gtts.  If not extubated change to precedex.   DM type 2 with neuropathy. - holding outpt metformin, ozempic Plan Cont Neurontin  Cont ssi, tubefeed coverage at 3 units q4 and Semglee at 15 units daily    Best Practice (right click and "Reselect all SmartList Selections" daily)   Diet/type: tubefeeds DVT prophylaxis: DOAC GI prophylaxis: PPI Lines: Central line Foley:  N/A Code Status:  full code Last date of multidisciplinary goals of care discussion [x]     Critical care time: 32 min    CRITICAL  CARE Performed by: Clementeen Graham

## 2021-04-05 NOTE — Procedures (Signed)
Extubation Procedure Note  Patient Details:   Name: Raymond Allen DOB: 1960/04/23 MRN: [id number]   Airway Documentation:    Vent end date: 04/05/21 Vent end time: 1022   Evaluation  O2 sats: stable throughout Complications: No apparent complications Patient did tolerate procedure well. Bilateral Breath Sounds: Rhonchi   Pt extubated to 5L [place] per MD order. Pt had positive cuff leak prior to extubation. No stridor noted. Pt able to voice his name.  [name] 04/05/2021, 10:22 AM

## 2021-04-05 NOTE — Progress Notes (Signed)
eLink Physician-Brief Progress Note Patient Name: Raymond Allen DOB: 06/15/1960 MRN: [id number]   Date of Service  04/05/2021  HPI/Events of Note  Vomited large amount of yellow fluid. TF placed on hold. Abdomen reported to be distended. Patient on SemGlee and Novolog SSI. In addition, patient c/o chronic back pain.   eICU Interventions  Plan: Hold TF's. Place NGT to LIS. Fentanyl 12.5 mcg IV Q 4 hours PRN pain.  D10W IV infusion at 50 mL/hour. Portable abdominal film now.      Intervention Category Major Interventions: Other:  Lysle Dingwall 04/05/2021, 9:33 PM

## 2021-04-05 NOTE — Progress Notes (Addendum)
Patient ID: Raymond Allen, male   DOB: 1960/05/18, 60 y.o.   MRN: [id number]     Advanced Heart Failure Rounding Note  PCP-Cardiologist: Dr. Smith/Dr. Haroldine Laws    Subjective:    60/ y/o male with DM2, HTN, former smoker, remote h/o colon cancer -> liver CA (2003) admitted with acute respiratory failure and pulmonary edema. Found to have new onset CM EF 25-30%. Subsequently developed AF with RVR.   Off pressors. ? CO-Ox 98%  CVP 12  Extubated this am. Agitated. Starting Precedex  Remains in SR  Completed ceftriaxone for Strep aglactiae PNA. PCT 118 => 0.22 (?)     Objective:   Weight Range: 125.7 kg Body mass index is 36.56 kg/m.   Vital Signs:   Temp:  [98 F (36.7 C)-99.6 F (37.6 C)] 98 F (36.7 C) (12/20 0742) Pulse Rate:  [75-110] 105 (12/20 0956) Resp:  [14-20] 17 (12/20 0755) BP: (89-127)/(61-96) 104/76 (12/20 0600) SpO2:  [94 %-98 %] 98 % (12/20 0755) Arterial Line BP: (90-179)/(49-88) 97/55 (12/20 0615) FiO2 (%):  [40 %] 40 % (12/20 0755) Weight:  [125.7 kg] 125.7 kg (12/20 0500) Last BM Date: 03/29/21  Weight change: Filed Weights   04/02/21 0500 04/04/21 0400 04/05/21 0500  Weight: 124 kg 123.7 kg 125.7 kg    Intake/Output:   Intake/Output Summary (Last 24 hours) at 04/05/2021 1017 Last data filed at 04/05/2021 1008 Gross per 24 hour  Intake 2328.9 ml  Output 1375 ml  Net 953.9 ml      Physical Exam  CVP 12 General:  Agitated. Pulling on restraints. Appears confused HEENT: normal Neck: supple. JVP 12 cm. Carotids 2+ bilat; no bruits.  Cor: PMI nondisplaced. Regular rate & rhythm. No rubs, gallops or murmurs. Lungs: clear, O2 sats stable in 90s Abdomen: soft, nontender, + distended. No hepatosplenomegaly.  Extremities: no cyanosis, clubbing, rash, 1+ edema Neuro: Agitated. Not following commands.   Telemetry   Sinus 80-90s Personally reviewed    Labs    CBC Recent Labs    04/04/21 0256 04/05/21 0340  WBC 7.8 6.6  NEUTROABS   --  4.4  HGB 11.5* 11.3*  HCT 39.3 37.9*  MCV 84.7 83.7  PLT 244 400   Basic Metabolic Panel Recent Labs    04/03/21 0415 04/04/21 0256 04/05/21 0340  NA 139 139 139  K 4.0 4.0 4.0  CL 103 103 106  CO2 _0 GLUCOSE 218* 212* 228*  BUN 30* 35* 38*  CREATININE 1.18 1.12 1.18  CALCIUM 8.8* 9.0 9.0  MG 2.1  --  2.3   Liver Function Tests No results for input(s): AST, ALT, ALKPHOS, BILITOT, PROT, ALBUMIN in the last 72 hours. No results for input(s): LIPASE, AMYLASE in the last 72 hours. Cardiac Enzymes No results for input(s): CKTOTAL, CKMB, CKMBINDEX, TROPONINI in the last 72 hours.  BNP: BNP (last 3 results) Recent Labs    03/29/21 0502  BNP 391.6*    ProBNP (last 3 results) No results for input(s): PROBNP in the last 8760 hours.   D-Dimer No results for input(s): DDIMER in the last 72 hours. Hemoglobin A1C No results for input(s): HGBA1C in the last 72 hours.  Fasting Lipid Panel Recent Labs    04/05/21 0340  TRIG 123   Thyroid Function Tests No results for input(s): TSH, T4TOTAL, T3FREE, THYROIDAB in the last 72 hours.  Invalid input(s): FREET3   Other results:   Imaging    No results found.   Medications:  Scheduled Medications:  amiodarone  400 mg Oral BID   chlorhexidine gluconate (MEDLINE KIT)  15 mL Mouth Rinse BID   Chlorhexidine Gluconate Cloth  6 each Topical Daily   digoxin  0.125 mg Per Tube Daily   docusate  100 mg Per Tube BID   feeding supplement (PROSource TF)  45 mL Per Tube QID   gabapentin  600 mg Per Tube BID WC   gabapentin  900 mg Per Tube QHS   insulin aspart  0-20 Units Subcutaneous Q4H   insulin aspart  3 Units Subcutaneous Q4H   insulin glargine-yfgn  15 Units Subcutaneous Daily   losartan  12.5 mg Per Tube BID   mouth rinse  15 mL Mouth Rinse 10 times per day   pantoprazole sodium  40 mg Per Tube Daily   polyethylene glycol  17 g Per Tube Daily   rivaroxaban  20 mg Per Tube Q supper   rosuvastatin   20 mg Per Tube Daily   sodium chloride flush  10-40 mL Intracatheter Q12H   sodium chloride flush  3 mL Intravenous Q12H   sodium chloride flush  3 mL Intravenous Q12H   spironolactone  12.5 mg Per Tube Daily    Infusions:  sodium chloride     sodium chloride Stopped (04/02/21 1330)   feeding supplement (VITAL HIGH PROTEIN) 1,000 mL (04/05/21 0900)   fentaNYL infusion INTRAVENOUS 150 mcg/hr (04/05/21 0600)   propofol (DIPRIVAN) infusion 25 mcg/kg/min (04/05/21 0600)    PRN Medications: Place/Maintain arterial line **AND** sodium chloride, sodium chloride, acetaminophen, cyclobenzaprine, fentaNYL, metoprolol tartrate, ondansetron (ZOFRAN) IV, polyethylene glycol, sodium chloride flush, sodium chloride flush   Assessment/Plan   1. Acute Systolic Heart Failure (New)  - Admitted w/ acute pulmonary edema/ hypoxic respiratory failure  - Echo LVEF 25-30%, diffuse HK, RV mildly reduced. No prior study for comparison - Etiology uncertain. H/o chemotherapy w/ Fluoropyrimidines (potentially cardiotoxic) but this was remotely. Off chemo since 2003 - Cath 12/16 minimal CAD. Preserved CO. Low SVR - Cardiac MRI once stable.  - recheck CO-OX, CVP 12.  Agree with 80 mg IV lasix today. - Continue spironolactone 12.5 daily.  - Continue digoxin 0.125  - Continue losartan 12.5 mg bid, if BP/creatinine stay stable, transition to Entresto.  - No b-blocker yet - MAPs elevated in setting of agitation post extubation, otherwise too soft to titrate medical therapy any further for now.  2. Septic Shock  - Afebrile  - PCT 0.32 -> 118 -> 0.22 (?)  - Resolved, stable BP.  - Completed ceftriaxone for Strep agalactiae.  - No change   3. Acute Hypoxic Respiratory Failure - 2/2 acute pulmonary edema and PNA - intubated, vent management per PCCM  - extubated today. Agitated and pulling on restraints. Appears confused. PCCM starting precedex.   4. Mitral Regurgitation  - Mod-severe on echo. No significant  v-waves in PCWP tracing on cath - Suspect functional in setting of severely dilated LV  - Doubt this is major driver currently. Can repeat echo prior to d/c and consider TEE as needed   5. AKI on CKD Stage II due to shock - Baseline SCr ~1.2  - Scr stable at 1.18   6. Hypokalemia -  K 4.0. - Supp as needed. Aim to keep > 4.0   7. Type 2DM  - Hgb A1c 7.7  - Consider SGLT2i initiation prior to d/c   8. Atrial Tach vs AFlutter w/ RVR - On po amio - On Xarelto 20  Length of Stay: 7  [facility], [place], PA-C  04/05/2021, 10:17 AM  Advanced Heart Failure Team Pager [phone] (M-F; 7a - 5p)  Please contact [facility] for night-coverage after hours (5p -7a ) and weekends on [email or web address]  Agree with above.    Remained intubated on am rounds. Subsequently extubated. Had agitation throughout the day as well as ileus. This evening he is more calm. Says he doesn;t understand what happened to him. Denies CP or SOB. Feels weak. Off milrinone co-ox 71%. Remains in NSR on amio. CVP 12  General:  Weak appearing. No resp difficulty HEENT: normal Neck: supple. JVP to jaw Carotids 2+ bilat; no bruits. No lymphadenopathy or thryomegaly appreciated. Cor: PMI nondisplaced. Regular rate & rhythm. No rubs, gallops or murmurs. Lungs: clear Abdomen: soft, nontender, + distended. No hepatosplenomegaly. No bruits or masses. Hypoactive bowel sounds. Extremities: no cyanosis, clubbing, rash, trace edema Neuro: alert & orientedx3, cranial nerves grossly intact. moves all 4 extremities but weak. Affect pleasant  He remains tenuous but now extubated. Co-ox stable Volume status elevated. Will diurese with IV lasix. Continue amio/xarelto. Will need cMRI when more stable.   CRITICAL CARE Performed by: Glori Bickers  Total critical care time: 35 minutes  Critical care time was exclusive of separately billable procedures and treating other patients.  Critical care was necessary to treat or prevent  imminent or life-threatening deterioration.  Critical care was time spent personally by me (independent of midlevel providers or residents) on the following activities: development of treatment plan with patient and/or surrogate as well as nursing, discussions with consultants, evaluation of patient's response to treatment, examination of patient, obtaining history from patient or surrogate, ordering and performing treatments and interventions, ordering and review of laboratory studies, ordering and review of radiographic studies, pulse oximetry and re-evaluation of patient's condition.  Glori Bickers, MD  8:59 PM

## 2021-04-06 ENCOUNTER — Inpatient Hospital Stay (HOSPITAL_COMMUNITY): Payer: Medicare HMO

## 2021-04-06 DIAGNOSIS — I498 Other specified cardiac arrhythmias: Secondary | ICD-10-CM

## 2021-04-06 DIAGNOSIS — I5023 Acute on chronic systolic (congestive) heart failure: Secondary | ICD-10-CM

## 2021-04-06 DIAGNOSIS — I10 Essential (primary) hypertension: Secondary | ICD-10-CM

## 2021-04-06 DIAGNOSIS — G9341 Metabolic encephalopathy: Secondary | ICD-10-CM

## 2021-04-06 LAB — GLUCOSE, CAPILLARY
Glucose-Capillary: 155 mg/dL — ABNORMAL HIGH (ref 70–99)
Glucose-Capillary: 158 mg/dL — ABNORMAL HIGH (ref 70–99)
Glucose-Capillary: 164 mg/dL — ABNORMAL HIGH (ref 70–99)
Glucose-Capillary: 176 mg/dL — ABNORMAL HIGH (ref 70–99)
Glucose-Capillary: 182 mg/dL — ABNORMAL HIGH (ref 70–99)
Glucose-Capillary: 186 mg/dL — ABNORMAL HIGH (ref 70–99)

## 2021-04-06 LAB — CBC
HCT: 42.3 % (ref 39.0–52.0)
Hemoglobin: 12.1 g/dL — ABNORMAL LOW (ref 13.0–17.0)
MCH: 24.2 pg — ABNORMAL LOW (ref 26.0–34.0)
MCHC: 28.6 g/dL — ABNORMAL LOW (ref 30.0–36.0)
MCV: 84.6 fL (ref 80.0–100.0)
Platelets: 287 10*3/uL (ref 150–400)
RBC: 5 MIL/uL (ref 4.22–5.81)
RDW: 16 % — ABNORMAL HIGH (ref 11.5–15.5)
WBC: 7.5 10*3/uL (ref 4.0–10.5)
nRBC: 0 % (ref 0.0–0.2)

## 2021-04-06 LAB — BASIC METABOLIC PANEL
Anion gap: 7 (ref 5–15)
BUN: 26 mg/dL — ABNORMAL HIGH (ref 8–23)
CO2: 29 mmol/L (ref 22–32)
Calcium: 9.3 mg/dL (ref 8.9–10.3)
Chloride: 103 mmol/L (ref 98–111)
Creatinine, Ser: 1.07 mg/dL (ref 0.61–1.24)
GFR, Estimated: >60 mL/min (ref >60)
Glucose, Bld: 243 mg/dL — ABNORMAL HIGH (ref 70–99)
Potassium: 4.1 mmol/L (ref 3.5–5.1)
Sodium: 139 mmol/L (ref 135–145)

## 2021-04-06 LAB — COOXEMETRY PANEL
Carboxyhemoglobin: 1.6 % — ABNORMAL HIGH (ref 0.5–1.5)
Methemoglobin: 0.5 % (ref 0.0–1.5)
O2 Saturation: 80.9 %
Total hemoglobin: 12.4 g/dL (ref 12.0–16.0)

## 2021-04-06 MED ORDER — BISACODYL 10 MG RE SUPP
10.0000 mg | Freq: Every day | RECTAL | Status: DC | PRN
  Administered 2021-04-06: 09:00:00 10 mg via RECTAL
  Filled 2021-04-06: qty 1

## 2021-04-06 MED ORDER — AMIODARONE HCL 200 MG PO TABS
400.0000 mg | ORAL_TABLET | Freq: Two times a day (BID) | ORAL | Status: DC
  Administered 2021-04-06 – 2021-04-07 (×2): 400 mg via ORAL
  Filled 2021-04-06 (×2): qty 2

## 2021-04-06 MED ORDER — METOCLOPRAMIDE HCL 5 MG/ML IJ SOLN
10.0000 mg | Freq: Four times a day (QID) | INTRAMUSCULAR | Status: DC | PRN
  Administered 2021-04-06: 09:00:00 10 mg via INTRAVENOUS
  Filled 2021-04-06: qty 2

## 2021-04-06 MED ORDER — DOCUSATE SODIUM 283 MG RE ENEM: 1.0000 | ENEMA | Freq: Every day | RECTAL | Status: DC | PRN

## 2021-04-06 MED ORDER — DOCUSATE SODIUM 100 MG PO CAPS
100.0000 mg | ORAL_CAPSULE | Freq: Two times a day (BID) | ORAL | Status: DC
  Administered 2021-04-06 – 2021-04-11 (×8): 100 mg via ORAL
  Filled 2021-04-06 (×10): qty 1

## 2021-04-06 MED ORDER — GABAPENTIN 600 MG PO TABS
600.0000 mg | ORAL_TABLET | Freq: Two times a day (BID) | ORAL | Status: DC
  Administered 2021-04-07 – 2021-04-11 (×9): 600 mg via ORAL
  Filled 2021-04-06 (×10): qty 1

## 2021-04-06 MED ORDER — SACUBITRIL-VALSARTAN 24-26 MG PO TABS
1.0000 | ORAL_TABLET | Freq: Two times a day (BID) | ORAL | Status: DC
  Administered 2021-04-06 – 2021-04-07 (×3): 1 via ORAL
  Filled 2021-04-06 (×4): qty 1

## 2021-04-06 MED ORDER — ENSURE ENLIVE PO LIQD
237.0000 mL | Freq: Two times a day (BID) | ORAL | Status: DC
  Administered 2021-04-07 (×2): 237 mL via ORAL

## 2021-04-06 MED ORDER — POTASSIUM CHLORIDE CRYS ER 20 MEQ PO TBCR
40.0000 meq | EXTENDED_RELEASE_TABLET | Freq: Once | ORAL | Status: AC
  Administered 2021-04-06: 14:00:00 40 meq via ORAL
  Filled 2021-04-06: qty 2

## 2021-04-06 MED ORDER — LACTULOSE 10 GM/15ML PO SOLN
20.0000 g | Freq: Two times a day (BID) | ORAL | Status: DC
  Administered 2021-04-06 – 2021-04-10 (×8): 20 g via ORAL
  Filled 2021-04-06 (×11): qty 30

## 2021-04-06 MED ORDER — GABAPENTIN 600 MG PO TABS
900.0000 mg | ORAL_TABLET | Freq: Every day | ORAL | Status: DC
  Administered 2021-04-07 – 2021-04-10 (×4): 900 mg via ORAL
  Filled 2021-04-06 (×2): qty 1.5
  Filled 2021-04-06 (×3): qty 2
  Filled 2021-04-06: qty 1.5
  Filled 2021-04-06: qty 2

## 2021-04-06 MED ORDER — DIGOXIN 125 MCG PO TABS
0.1250 mg | ORAL_TABLET | Freq: Every day | ORAL | Status: DC
  Administered 2021-04-07 – 2021-04-11 (×5): 0.125 mg via ORAL
  Filled 2021-04-06 (×5): qty 1

## 2021-04-06 MED ORDER — CYCLOBENZAPRINE HCL 10 MG PO TABS: 10.0000 mg | ORAL_TABLET | Freq: Three times a day (TID) | ORAL | Status: DC | PRN

## 2021-04-06 MED ORDER — POLYETHYLENE GLYCOL 3350 17 G PO PACK: 17.0000 g | PACK | Freq: Every day | ORAL | Status: DC | PRN

## 2021-04-06 MED ORDER — LOSARTAN POTASSIUM 25 MG PO TABS
12.5000 mg | ORAL_TABLET | Freq: Two times a day (BID) | ORAL | Status: DC
  Filled 2021-04-06: qty 0.5

## 2021-04-06 MED ORDER — SPIRONOLACTONE 12.5 MG HALF TABLET
12.5000 mg | ORAL_TABLET | Freq: Every day | ORAL | Status: DC
  Administered 2021-04-07: 09:00:00 12.5 mg via ORAL
  Filled 2021-04-06: qty 1

## 2021-04-06 MED ORDER — FUROSEMIDE 10 MG/ML IJ SOLN
60.0000 mg | Freq: Two times a day (BID) | INTRAMUSCULAR | Status: AC
  Administered 2021-04-06 – 2021-04-07 (×3): 60 mg via INTRAVENOUS
  Filled 2021-04-06 (×3): qty 6

## 2021-04-06 MED ORDER — ACETAMINOPHEN 325 MG PO TABS: 650.0000 mg | ORAL_TABLET | ORAL | Status: DC | PRN

## 2021-04-06 MED ORDER — RIVAROXABAN 20 MG PO TABS
20.0000 mg | ORAL_TABLET | Freq: Every day | ORAL | Status: DC
  Administered 2021-04-06 – 2021-04-10 (×5): 20 mg via ORAL
  Filled 2021-04-06 (×5): qty 1

## 2021-04-06 MED ORDER — INSULIN ASPART 100 UNIT/ML IJ SOLN: 0.0000 [IU] | Freq: Every day | INTRAMUSCULAR | Status: DC

## 2021-04-06 MED ORDER — INSULIN ASPART 100 UNIT/ML IJ SOLN
0.0000 [IU] | Freq: Three times a day (TID) | INTRAMUSCULAR | Status: DC
  Administered 2021-04-06 – 2021-04-07 (×2): 4 [IU] via SUBCUTANEOUS
  Administered 2021-04-07: 16:00:00 15 [IU] via SUBCUTANEOUS
  Administered 2021-04-07: 08:00:00 3 [IU] via SUBCUTANEOUS

## 2021-04-06 MED ORDER — ROSUVASTATIN CALCIUM 20 MG PO TABS
20.0000 mg | ORAL_TABLET | Freq: Every day | ORAL | Status: DC
  Administered 2021-04-07 – 2021-04-11 (×5): 20 mg via ORAL
  Filled 2021-04-06 (×5): qty 1

## 2021-04-06 MED FILL — Heparin Sod (Porcine)-NaCl IV Soln 1000 Unit/500ML-0.9%: INTRAVENOUS | Qty: 1000 | Status: AC

## 2021-04-06 NOTE — Evaluation (Signed)
Physical Therapy Evaluation Patient Details Name: Raymond Allen MRN: [id number] DOB: 05/25/1960 Today's Date: 04/06/2021  History of Present Illness  Pt is 60 yo male who presented with several weeks of dyspnea and edema. Found to have hypervolemia with pulmonary edema.  Failed Bipap and required intubation. Extubated on 12/20.DM type 2, DVT on xarelto, HTN, Colon cancer s/p partial colectomy 2001 with liver mets s/p Lt hepatectomy, CKD 3a, ED.  Clinical Impression  Pt admitted with/for dyspnea and edema.  Pt needing moderate assist overall for basic mobility.  Pt currently limited functionally due to the problems listed. ( See problems list.)   Pt will benefit from PT to maximize function and safety in order to get ready for next venue listed below.        Recommendations for follow up therapy are one component of a multi-disciplinary discharge planning process, led by the attending physician.  Recommendations may be updated based on patient status, additional functional criteria and insurance authorization.  Follow Up Recommendations Skilled nursing-short term rehab (<3 hours/day)    Assistance Recommended at Discharge Intermittent Supervision/Assistance  Functional Status Assessment Patient has had a recent decline in their functional status and demonstrates the ability to make significant improvements in function in a reasonable and predictable amount of time.  Equipment Recommendations  None recommended by PT (TBA)    Recommendations for Other Services       Precautions / Restrictions Precautions Precautions: Fall Precaution Comments: decreased awareness of deficits + a bit impulsive      Mobility  Bed Mobility Overal bed mobility: Needs Assistance Bed Mobility: Sit to Supine     Supine to sit: Mod assist;HOB elevated Sit to supine: Mod assist;Max assist   General bed mobility comments: cues for hand placement/sequencing, truncal control on descent and assist lifting  LE's into bed.    Transfers Overall transfer level: Needs assistance Equipment used: 1 person hand held assist Transfers: Sit to/from Stand;Bed to chair/wheelchair/BSC Sit to Stand: Mod assist;+2 safety/equipment Stand pivot transfers: Mod assist;+2 safety/equipment Step pivot transfers: Mod assist       General transfer comment: cues for hand placement, assist forward and boost.  Stood for pericare and then assist to pivot, to bed, sit then stand a 2nd time to step toward [facility].    Ambulation/Gait               General Gait Details: 2 feet toward [facility]  Stairs            Wheelchair Mobility    Modified Rankin (Stroke Patients Only)       Balance Overall balance assessment: Needs assistance Sitting-balance support: Bilateral upper extremity supported;Single extremity supported;Feet supported Sitting balance-Leahy Scale: Poor Sitting balance - Comments: BUE support needed in static sitting   Standing balance support: Bilateral upper extremity supported;During functional activity Standing balance-Leahy Scale: Poor Standing balance comment: reliant on external support and/or AD                             Pertinent Vitals/Pain Pain Assessment: Faces Faces Pain Scale: Hurts a little bit Pain Location: knees, joints Pain Descriptors / Indicators: Discomfort Pain Intervention(s): Monitored during session    Home Living Family/patient expects to be discharged to:: Private residence Living Arrangements: Alone Available Help at Discharge: Other (Comment) (girlfriend) Type of Home: House Home Access: Stairs to enter   [employer] of Steps: 1   Home Layout: One level Home Equipment: Rolling  Walker (2 wheels);Cane - single point;Wheelchair - Water quality scientist (4 wheels) Additional Comments: girlfriend works full-time at [employer].  Pt needs to be able to stay by    Prior Function Prior Level of Function : Independent/Modified Independent              Mobility Comments: endorses 1 fall this year, outside in the yard ADLs Comments: drives, does his own shopping     Hand Dominance        Extremity/Trunk Assessment   Upper Extremity Assessment Upper Extremity Assessment: Generalized weakness    Lower Extremity Assessment Lower Extremity Assessment: Generalized weakness (large muscle group weakness,  Knees did not buckle.)    Cervical / Trunk Assessment Cervical / Trunk Assessment: Other exceptions Cervical / Trunk Exceptions: large body habitus  Communication   Communication: No difficulties  Cognition Arousal/Alertness: Awake/alert Behavior During Therapy: Flat affect Overall Cognitive Status: No family/caregiver present to determine baseline cognitive functioning                                 General Comments: A&O to person, place, time. Per nurse not as impulsive and tangential this afternoon        General Comments General comments (skin integrity, edema, etc.): On 1 L pt's sats dropped into the upper 80's on 2-3L [place], safs mid to upper 90's, RR in the upper 20's    Exercises     Assessment/Plan    PT Assessment Patient needs continued PT services  PT Problem List Decreased strength;Decreased activity tolerance;Decreased balance;Decreased mobility;Decreased coordination;Decreased knowledge of use of DME;Cardiopulmonary status limiting activity       PT Treatment Interventions DME instruction;Gait training;Functional mobility training;Therapeutic activities;Balance training;Patient/family education    PT Goals (Current goals can be found in the Care Plan section)  Acute Rehab PT Goals Patient Stated Goal: ultimately to get back home. PT Goal Formulation: With patient Time For Goal Achievement: 04/20/21 Potential to Achieve Goals: Good    Frequency Min 3X/week   Barriers to discharge        Co-evaluation               AM-PAC PT "6 Clicks" Mobility  Outcome Measure Help  needed turning from your back to your side while in a flat bed without using bedrails?: A Lot Help needed moving from lying on your back to sitting on the side of a flat bed without using bedrails?: A Lot Help needed moving to and from a bed to a chair (including a wheelchair)?: A Lot Help needed standing up from a chair using your arms (e.g., wheelchair or bedside chair)?: A Lot Help needed to walk in hospital room?: Total Help needed climbing 3-5 steps with a railing? : Total 6 Click Score: 10    End of Session Equipment Utilized During Treatment: Oxygen Activity Tolerance: Patient tolerated treatment well;Patient limited by fatigue Patient left: in bed;with call bell/phone within reach;with bed alarm set Nurse Communication: Mobility status PT Visit Diagnosis: Unsteadiness on feet (R26.81);Other abnormalities of gait and mobility (R26.89);Muscle weakness (generalized) (M62.81);Difficulty in walking, not elsewhere classified (R26.2)    Time: 7341-9379 PT Time Calculation (min) (ACUTE ONLY): 18 min   Charges:   PT Evaluation $PT Eval Moderate Complexity: 1 Mod PT Treatments $Therapeutic Activity: 8-22 mins        04/06/2021  Ginger Carne., PT Acute Rehabilitation Services [phone]  (pager) [phone]  (office)  Tessie Fass Quantina Dershem 04/06/2021, 6:06 PM

## 2021-04-06 NOTE — Progress Notes (Signed)
[facility] Progress Note Patient Name: Raymond Allen DOB: 1961-01-21 MRN: [id number]   Date of Service  04/06/2021  HPI/Events of Note  Request to review abdominal film for NGT placement. Review of abdominal film reveals gastric catheter is again identified within the stomach. No obstructive changes are seen. Postsurgical changes on the right are noted. No free air is seen. NGT placement acceptable.   eICU Interventions  Continue NGT to LIS.     Intervention Category Major Interventions: Other:  Lysle Dingwall 04/06/2021, 12:55 AM

## 2021-04-06 NOTE — Progress Notes (Signed)
NAME:  Raymond Allen, MRN:  [id number], DOB:  Jan 27, 1961, LOS: 8 ADMISSION DATE:  03/29/2021, CONSULTATION DATE:  03/29/21 REFERRING MD:  Dr. Jerrol Banana, CHIEF COMPLAINT:  Short of breath   History of Present Illness:  60 yo male former smoker presented with several weeks of dyspnea and edema.  This was associated with cough with clear sputum.  Found to have hypervolemia with pulmonary edema.  Failed Bipap and required intubation.  Pertinent  Medical History  DM type 2, DVT on xarelto, HTN, Colon cancer s/p partial colectomy 2001 with liver mets s/p Lt hepatectomy, CKD 3a, ED  Significant Hospital Events: Including procedures, antibiotic start and stop dates in addition to other pertinent events   12/13 Admit, cardiology consulted, intubated. Echo 03/29/21 >> EF 25 to 30%, RVSP 38 mmHg, mod LA dilation, mod/severe MR 12/15 febrile 12/16 poor mental status, RHC/LHC with non-obstructive CAD, mean PA elevated with normal PVR, preserved CO. Heart cath with preserved CO, low SVR suggestive of distributive shock 12/17 follows commands 12/19 completed ceftriaxone. Off all pressors  12/20 weaning. Working towards extubation  Interim History / Subjective:  Patient was successfully extubated yesterday, remained agitated postextubation requiring Precedex infusion but later Precedex infusion was stopped He remained constipated, his last bowel movement was 12/13, looks very uncomfortable and he started vomiting overnight   Objective   Blood pressure 125/79, pulse 88, temperature 98.2 F (36.8 C), temperature source Oral, resp. rate (!) 21, height 6\' 1"  (1.854 m), weight 121.6 kg, SpO2 93 %. CVP:  [1 mmHg-34 mmHg] 23 mmHg  FiO2 (%):  [40 %] 40 %   Intake/Output Summary (Last 24 hours) at 04/06/2021 0943 Last data filed at 04/06/2021 4097 Gross per 24 hour  Intake 1220.63 ml  Output 4701 ml  Net -3480.37 ml   Filed Weights   04/04/21 0400 04/05/21 0500 04/06/21 0500  Weight: 123.7 kg 125.7 kg  121.6 kg    Examination: General: Acute on chronically ill-appearing male, lying on the bed HEENT: [place]/AT, eyes anicteric.  moist mucus membranes Neuro: Lethargic, opens eyes with vocal stimuli, following simple commands, moving all 4 extremities Chest: Bilateral basal crackles no wheezes or rhonchi Heart: Regular rate and rhythm, no murmurs or gallops Abdomen: Soft, nontender, distended, bowel sounds present Skin: No rash  Resolved Hospital Problem list   Strep agalactiae Pneumonia completed 5 days ceftriaxone on 12/19  Septic shock (resolved)  Assessment & Plan:  Acute hypoxic respiratory failure from acute pulmonary edema Acute HFrEF with EF 25-30% Functional mitral regurgitation due to dilated cardiomyopathy Patient tolerated spinal breathing trial, was successfully extubated Remained on 3 L nasal cannula oxygen X-ray chest was repeated which shows bilateral pulmonary infiltrate likely pulmonary edema Continue IV diuresis with a net negative goal of -1 L Heart failure team is following Continue spironolactone Cont Losartan w/ plan to transition to [facility] if BP/cr tolerates eventually Patient will require cardiac MRI Cont tele   Atrial arrhythmia: Now NSR  Continue p.o. amiodarone  Cont DOAC for stroke prophylaxis  DVT Cont xarelto   CKD 3a was ruled out Serum creatinine remained stable His estimated GFR more than 60  Acute metabolic encephalopathy from hypoxic respiratory failure. Patient mental status improving He is off sedation  DM type 2 with neuropathy. Holding outpt metformin, ozempic Cont Neurontin  Continue basal and bolus insulin  Stop D5 water infusion  Monitor fingerstick with goal 140-180  Hypokalemia/hypocalcemia, improved Continue aggressive electrolyte supplement   Best Practice (right click and "Reselect all SmartList Selections"  daily)   Diet/type: Speech and swallow evaluation DVT prophylaxis: DOAC GI prophylaxis: PPI Lines: Central  line, will try to discontinue Foley:  N/A Code Status:  full code Last date of multidisciplinary goals of care discussion [12/20: Patient's significant other was updated at bedside, decision was to continue full scope of care]   Critical care time:    Total critical care time: 33 minutes  Performed by: [place] care time was exclusive of separately billable procedures and treating other patients.   Critical care was necessary to treat or prevent imminent or life-threatening deterioration.   Critical care was time spent personally by me on the following activities: development of treatment plan with patient and/or surrogate as well as nursing, discussions with consultants, evaluation of patient's response to treatment, examination of patient, obtaining history from patient or surrogate, ordering and performing treatments and interventions, ordering and review of laboratory studies, ordering and review of radiographic studies, pulse oximetry and re-evaluation of patient's condition.   Jacky Kindle MD [place] [facility] See Amion for pager If no response to pager, please call [phone] until 7pm After 7pm, Please call E-link [phone]

## 2021-04-06 NOTE — TOC Initial Note (Signed)
Transition of Care [facility]) - Initial/Assessment Note    Patient Details  Name: Raymond Allen MRN: [id number] Date of Birth: [birth year]  Transition of Care [facility]) CM/SW Contact:    Erenest Rasher, RN Phone Number: [phone] 04/06/2021, 4:48 PM  Clinical Narrative:                  HF TOC CM spoke to pt and SO, Lindora at bedside. Offered choice for [facility]. Pt agreeable to [facility] agency that will accept health plan. States he was independent prior to hospital stay. Has RW and cane at home. Waiting PT/OT recommendations for home. Contacted [facility] rep, Stacie with new referral.   Will need HH orders with F2F at time of dc.    Expected Discharge Plan: [place] Barriers to Discharge: Continued Medical Work up   Patient Goals and CMS Choice Patient states their goals for this hospitalization and ongoing recovery are:: wants to go home at discharge CMS Medicare.gov Compare Post Acute Care list provided to:: Patient Choice offered to / list presented to : Patient  Expected Discharge Plan and Services Expected Discharge Plan: [place] In-house Referral: Clinical Social Work Discharge Planning Services: CM Consult Post Acute Care Choice: [place] arrangements for the past 2 months: [place]: [place] Date [place]: 04/06/21 Time [place]: [place] Representative spoke with at [place]: Freddi Starr  Prior Living Arrangements/Services Living arrangements for the past 2 months: Single Family Home Lives with:: Significant Other   Do you feel safe going back to the place where you live?: Yes      Need for Family Participation in Patient Care: Yes (Comment) Care giver support system in place?: Yes (comment) Current home services: DME (rolling walker, cane) Criminal Activity/Legal Involvement Pertinent to Current Situation/Hospitalization: No -  Comment as needed  Activities of Daily Living      Permission Sought/Granted Permission sought to share information with : Case Manager, Family Supports, PCP Permission granted to share information with : Yes, Verbal Permission Granted  Share Information with NAME: Lindora Rowell  Permission granted to share info w AGENCY: [place], DME  Permission granted to share info w Relationship: significant other  Permission granted to share info w Contact Information: [phone]  Emotional Assessment Appearance:: Appears stated age Attitude/Demeanor/Rapport: Gracious Affect (typically observed): Accepting Orientation: : Oriented to Place, Oriented to  Time, Oriented to Situation, Oriented to Self   Psych Involvement: No (comment)  Admission diagnosis:  Shortness of breath [R06.02] Respiratory distress [R06.03] Encounter for imaging study to confirm orogastric (OG) tube placement [Z01.89] Acute on chronic congestive heart failure, unspecified heart failure type ([place]) [I50.9] Patient Active Problem List   Diagnosis Date Noted   Acute systolic heart failure ([place]) 04/05/2021   Pulmonary edema 50/27/7412   Acute metabolic encephalopathy 87/86/7672   CKD (chronic kidney disease) stage 3, GFR 30-59 ml/min ([place]) 04/05/2021   Atrial arrhythmia 04/05/2021   Respiratory distress 03/29/2021   Acute on chronic congestive heart failure (HCC)    Type 2 diabetes mellitus without complication ([place])    Incisional hernia 11/27/2017   Colon cancer metastasized to liver ([place]) 01/05/2012   FLANK PAIN, RIGHT 01/21/2008   TOBACCO ABUSE 12/26/2007   HYPERGLYCEMIA 12/26/2007  PERS HX NONCOMPLIANCE W/MED TX PRS HAZARDS HLTH 12/26/2007   CARCINOMA, COLON 12/17/2007   Essential hypertension 12/17/2007   Respiratory failure, acute ([place]) 12/17/2007   PCP:  [place], Julie M, NP Pharmacy:   [id number] - [place], [place] [address] [place] [address] Phone: [phone] Fax: [phone]     Social Determinants of Health (SDOH) Interventions    Readmission Risk Interventions No flowsheet data found.

## 2021-04-06 NOTE — Progress Notes (Addendum)
Patient ID: Raymond Allen, male   DOB: Feb 18, 1961, 60 y.o.   MRN: [id number]     Advanced Heart Failure Rounding Note  PCP-Cardiologist: Dr. Smith/Dr. Haroldine Laws    Subjective:    60/ y/o male with DM2, HTN, former smoker, remote h/o colon cancer -> liver CA (2003) admitted with acute respiratory failure and pulmonary edema.  Completed ceftriaxone for Strep aglactiae PNA. PCT 118 => 0.22 (?)   Found to have new onset CM EF 25-30%. Subsequently developed AF with RVR.   12/20 Extubated  Swallow eval today. Episodes of vomiting overnight. NGT placed. No obstruction on KUB.  Passing gas.  Less confusion today.   Diuresed well yesterday with IV lasix. Weight down 9 lb.     Objective:   Weight Range: 121.6 kg Body mass index is 35.37 kg/m.   Vital Signs:   Temp:  [97.9 F (36.6 C)-98.5 F (36.9 C)] 97.9 F (36.6 C) (12/21 1131) Pulse Rate:  [78-96] 96 (12/21 1057) Resp:  [13-37] 21 (12/21 0930) BP: (112-148)/(69-96) 125/79 (12/21 0930) SpO2:  [93 %-100 %] 93 % (12/21 0930) Arterial Line BP: (66-114)/(45-69) 86/56 (12/20 1700) FiO2 (%):  [40 %] 40 % (12/20 1600) Weight:  [121.6 kg] 121.6 kg (12/21 0500) Last BM Date: 03/29/21  Weight change: Filed Weights   04/04/21 0400 04/05/21 0500 04/06/21 0500  Weight: 123.7 kg 125.7 kg 121.6 kg    Intake/Output:   Intake/Output Summary (Last 24 hours) at 04/06/2021 1148 Last data filed at 04/06/2021 1000 Gross per 24 hour  Intake 1028.39 ml  Output 3301 ml  Net -2272.61 ml      Physical Exam  General:  Sitting on bedside commode. Appears fatigued. HEENT: normal, + NGT Neck: supple. JVP 9 cm. Carotids 2+ bilat; no bruits.  Cor: PMI nondisplaced. Regular rate & rhythm. No rubs, gallops or murmurs. Lungs: CTA Abdomen: soft, nontender, nondistended Extremities: no cyanosis, clubbing, rash, 1+ edema Neuro: Alert and oriented. Following commands. Much less confused.   Telemetry   SR 80s-90s  Labs    CBC Recent  Labs    04/05/21 0340 04/06/21 0355  WBC 6.6 7.5  NEUTROABS 4.4  --   HGB 11.3* 12.1*  HCT 37.9* 42.3  MCV 83.7 84.6  PLT 269 387   Basic Metabolic Panel Recent Labs    04/05/21 0340 04/06/21 0355  NA 139 139  K 4.0 4.1  CL 106 103  CO2 27 29  GLUCOSE 228* 243*  BUN 38* 26*  CREATININE 1.18 1.07  CALCIUM 9.0 9.3  MG 2.3  --    Liver Function Tests No results for input(s): AST, ALT, ALKPHOS, BILITOT, PROT, ALBUMIN in the last 72 hours. No results for input(s): LIPASE, AMYLASE in the last 72 hours. Cardiac Enzymes No results for input(s): CKTOTAL, CKMB, CKMBINDEX, TROPONINI in the last 72 hours.  BNP: BNP (last 3 results) Recent Labs    03/29/21 0502  BNP 391.6*    ProBNP (last 3 results) No results for input(s): PROBNP in the last 8760 hours.   D-Dimer No results for input(s): DDIMER in the last 72 hours. Hemoglobin A1C No results for input(s): HGBA1C in the last 72 hours.  Fasting Lipid Panel Recent Labs    04/05/21 0340  TRIG 123   Thyroid Function Tests No results for input(s): TSH, T4TOTAL, T3FREE, THYROIDAB in the last 72 hours.  Invalid input(s): FREET3   Other results:   Imaging    DG Abd 1 View  Result Date: 04/05/2021 CLINICAL  DATA:  Check gastric catheter placement EXAM: ABDOMEN - 1 VIEW COMPARISON:  Film from earlier in the same day. FINDINGS: Gastric catheter is again identified within the stomach. No obstructive changes are seen. Postsurgical changes on the right are noted. No free air is seen. IMPRESSION: Gastric catheter in the stomach. Electronically Signed   By: Inez Catalina M.D.   On: 04/05/2021 23:25   DG Chest Port 1 View  Result Date: 04/06/2021 CLINICAL DATA:  Pulmonary edema J81.1 (ICD-10-CM) EXAM: PORTABLE CHEST 1 VIEW COMPARISON:  04/01/2021. FINDINGS: Gastric tube courses below the diaphragm with the tip in the expected region the stomach. Side port is below the GE junction. Similar positioning of a left IJ central  venous catheter with the tip projecting at the expected junction of the brachiocephalic vein and SVC. Endotracheal tube is no longer visualized, presumably related to extubation. Similar enlargement of the cardiac silhouette. Similar pulmonary vascular congestion. Similar patchy/heterogeneous opacities bilaterally. No definite pleural effusions or pneumothorax on this single AP semi erect radiograph. IMPRESSION: 1. Cardiomegaly and pulmonary vascular congestion. 2. Similar heterogeneous opacities bilaterally, possibly due to pulmonary edema or atelectasis. Electronically Signed   By: Margaretha Sheffield M.D.   On: 04/06/2021 08:18   DG Abd Portable 1V  Result Date: 04/05/2021 CLINICAL DATA:  NG tube placement. EXAM: PORTABLE ABDOMEN - 1 VIEW COMPARISON:  Abdominal x-ray 03/30/2021. FINDINGS: Nasogastric tube tip is in the proximal stomach. The stomach is dilated. There are surgical clips in the right abdomen. IMPRESSION: 1. Nasogastric tube tip in the proximal stomach. The stomach is dilated. Recommend advancing tube. Electronically Signed   By: Ronney Asters M.D.   On: 04/05/2021 22:34     Medications:     Scheduled Medications:  amiodarone  400 mg Oral BID   chlorhexidine gluconate (MEDLINE KIT)  15 mL Mouth Rinse BID   Chlorhexidine Gluconate Cloth  6 each Topical Daily   [START ON 04/07/2021] digoxin  0.125 mg Oral Daily   docusate sodium  100 mg Oral BID   furosemide  60 mg Intravenous BID   [START ON 04/07/2021] gabapentin  600 mg Oral BID WC   gabapentin  900 mg Oral QHS   insulin aspart  0-20 Units Subcutaneous TID WC   insulin aspart  0-5 Units Subcutaneous QHS   insulin glargine-yfgn  15 Units Subcutaneous Daily   lactulose  20 g Oral BID   losartan  12.5 mg Oral BID   rivaroxaban  20 mg Oral Q supper   [START ON 04/07/2021] rosuvastatin  20 mg Oral Daily   sodium chloride flush  10-40 mL Intracatheter Q12H   sodium chloride flush  3 mL Intravenous Q12H   [START ON 04/07/2021]  spironolactone  12.5 mg Oral Daily    Infusions:  sodium chloride      PRN Medications: Place/Maintain arterial line **AND** sodium chloride, acetaminophen, bisacodyl, cyclobenzaprine, docusate sodium, fentaNYL (SUBLIMAZE) injection, metoCLOPramide (REGLAN) injection, ondansetron (ZOFRAN) IV, polyethylene glycol, sodium chloride flush   Assessment/Plan   1. Acute Systolic Heart Failure ([place])  - Admitted w/ acute pulmonary edema/ hypoxic respiratory failure  - Echo LVEF 25-30%, diffuse HK, RV mildly reduced. No prior study for comparison - Etiology uncertain. H/o chemotherapy w/ Fluoropyrimidines (potentially cardiotoxic) but this was remotely. Off chemo since 2003 - Cath 12/16 minimal CAD. Preserved CO. Low SVR - Cardiac MRI once more stable. - CO-OX stable at 81% - Agree with lasix 60 mg IV BID - Continue spironolactone 12.5 daily.  - Continue digoxin  0.125  - Switch losartan to entresto 24/26 mg BID - No b-blocker yet  2. Septic Shock  - Afebrile  - PCT 0.32 -> 118 -> 0.22 (?)  - Resolved, stable BP.  - Completed ceftriaxone for Strep agalactiae.  - No change   3. Acute Hypoxic Respiratory Failure - 2/2 acute pulmonary edema and PNA - intubated, vent management per PCCM  - extubated 12/20 - O2 stable on RA - Swallow eval today   4. Mitral Regurgitation  - Mod-severe on echo. No significant v-waves in PCWP tracing on cath - Suspect functional in setting of severely dilated LV  - Doubt this is major driver currently. Can repeat echo prior to d/c and consider TEE as needed   5. AKI on CKD Stage II due to shock - Baseline SCr ~1.2  - Scr stable at 1.07   6. Hypokalemia -  K 4.1 - Supp as needed with diuresis - Aim to keep > 4.0   7. Type 2DM  - Hgb A1c 7.7  - Consider SGLT2i initiation prior to d/c   8. Atrial Tach vs AFlutter w/ RVR - On po amio 400 mg BID - On Xarelto 20  9. Vomiting/constipation - Now has NGT - No obstruction on xray - Suppository  today  Length of Stay: [place], [place], PA-C  04/06/2021, 11:48 AM  Advanced Heart Failure Team Pager [phone] (M-F; 7a - 5p)  Please contact [facility] for night-coverage after hours (5p -7a ) and weekends on [email or web address]  Patient seen and examined with the above-signed Advanced Practice Provider and/or Housestaff. I personally reviewed laboratory data, imaging studies and relevant notes. I independently examined the patient and formulated the important aspects of the plan. I have edited the note to reflect any of my changes or salient points. I have personally discussed the plan with the patient and/or family.  Extubated. Respiratory status more stable. Less agitated. Off inotropes. Has diuresed well. CVP 9  Co-ox 81%  Remains in NSR>. Struggling with ileus/constipation.   General:  Sitting on bedside commode No resp difficulty HEENT: normal Neck: supple. JVP to jaw Carotids 2+ bilat; no bruits. No lymphadenopathy or thryomegaly appreciated. Cor: PMI nondisplaced. Regular rate & rhythm. 2/6 SEM Lungs: clear Abdomen: soft, nontender, + distended. No hepatosplenomegaly. No bruits or masses. hypoactive bowel sounds. Extremities: no cyanosis, clubbing, rash, edema Neuro: alert & orientedx3, cranial nerves grossly intact. moves all 4 extremities w/o difficulty. Affect pleasant  Improving slowly. Continue to titrated GDMT as tolerated. Continue amio. Will need cMRI when more stable. D/w Dr. Tacy Learn (CCM) at bedside.    Glori Bickers, MD  10:29 PM

## 2021-04-06 NOTE — Progress Notes (Signed)
Nutrition Follow-up  DOCUMENTATION CODES:   Not applicable  INTERVENTION:   - Ensure Enlive po BID, each supplement provides 350 kcal and 20 grams of protein  - Encourage PO intake  NUTRITION DIAGNOSIS:   Inadequate oral intake related to inability to eat as evidenced by NPO status.  Progressing, pt now on full liquid diet  GOAL:   Patient will meet greater than or equal to 90% of their needs  Progressing  MONITOR:   PO intake, Supplement acceptance, Diet advancement, Labs, Weight trends, I & O's  REASON FOR ASSESSMENT:   Ventilator, Consult Enteral/tube feeding initiation and management  ASSESSMENT:   60 year old male who presented to the ED with SOB. PMH of HTN, T2DM, HLD, DVT, colon cancer with liver mets s/p partial colectomy/partial hepatectomy in 2001, venous stasis with ulceration. Pt admitted with acute hypoxic respiratory failure secondary to new onset CHF.  12/13 - intubated 12/14 - Cortrak placed (tip gastric) 12/16 - s/p RHC/LHC with non-obstructive CAD 12/20 - extubated, large volume emesis, tube feeds held, Cortrak removed, NGT placed to low intermittent suction 12/21 - NGT removed, full liquids  Discussed pt with RN and during ICU rounds. Pt with vomiting last night; NGT placed to low intermittent suction but has since been removed per pt request. No obstruction on KUB. Diet advanced to full liquids this AM after pt passed swallow evaluation. Attempted to speak with pt at bedside. Pt sleeping soundly and did not awaken to RD voice. RD will order oral nutrition supplements to aid pt in meeting kcal and protein needs.  Admit weight: 128.7 kg Current weight: 121.6 kg  Pt continues to diurese. Weight down 7 kg since admission; pt is net negative 8.1 L since admission. Pt still with mild pitting edema to BUE and BLE.  Medications reviewed and include: colace, IV lasix, SSI q 4 hours, semglee 15 units, spironolactone  Labs reviewed: BUN 26 CBG's: 147-195  x 24 hours  UOP: 4400 ml x 24 hours NGT output: 300 ml x 12 hours I/O's: -8.1 L since admit  Diet Order:   Diet Order             Diet full liquid Room service appropriate? Yes; Fluid consistency: Thin  Diet effective now                   EDUCATION NEEDS:   Not appropriate for education at this time  Skin:  Skin Assessment: Skin Integrity Issues: Other: skin tear RLE  Last BM:  03/29/21  Height:   Ht Readings from Last 1 Encounters:  03/29/21 6\' 1"  (1.854 m)    Weight:   Wt Readings from Last 1 Encounters:  04/06/21 121.6 kg    Ideal Body Weight:  83.6 kg  BMI:  Body mass index is 35.37 kg/m.  Estimated Nutritional Needs:   Kcal:  2200-2400  Protein:  110-130 grams  Fluid:  >/= 1.8 L    Gustavus Bryant, MS, RD, LDN Inpatient Clinical Dietitian Please see AMiON for contact information.

## 2021-04-06 NOTE — Progress Notes (Signed)
Progress Note  Pt needing stability assist and cues for transfer technique.  Emphasis on standing activity, transfers and transition to supine.    04/06/21 1744  PT Visit Information  Last PT Received On 04/06/21  Assistance Needed +2 (safety)  History of Present Illness Pt is 60 yo male who presented with several weeks of dyspnea and edema. Found to have hypervolemia with pulmonary edema.  Failed Bipap and required intubation. Extubated on 12/20.DM type 2, DVT on xarelto, HTN, Colon cancer s/p partial colectomy 2001 with liver mets s/p Lt hepatectomy, CKD 3a, ED.  Precautions  Precautions Fall  Pain Assessment  Pain Assessment Faces  Faces Pain Scale 2  Pain Intervention(s) Monitored during session  Cognition  Arousal/Alertness Awake/alert  Behavior During Therapy Flat affect  Overall Cognitive Status No family/caregiver present to determine baseline cognitive functioning  General Comments A&O to person, place, time. Per nurse not as impulsive and tangential this afternoon  Bed Mobility  Overal bed mobility Needs Assistance  Bed Mobility Sit to Supine  Sit to supine Mod assist;Max assist  General bed mobility comments cues for hand placement/sequencing, truncal control on descent and assist lifting LE's into bed.  Transfers  Overall transfer level Needs assistance  Equipment used 1 person hand held assist  Transfers Sit to/from Stand;Bed to chair/wheelchair/BSC  Sit to Stand Mod assist;+2 safety/equipment  Bed to/from chair/wheelchair/BSC transfer type: Stand pivot  Stand pivot transfers Mod assist;+2 safety/equipment  Step pivot transfers Mod assist  General transfer comment cues for hand placement, assist forward and boost.  Stood for pericare and then assist to pivot, to bed, sit then stand a 2nd time to step toward [facility].  Ambulation/Gait  General Gait Details 2 feet toward [facility]  Balance  Overall balance assessment Needs assistance  Sitting-balance support Bilateral upper  extremity supported;Single extremity supported;Feet supported  Sitting balance-Leahy Scale Poor  Sitting balance - Comments BUE support needed in static sitting  Standing balance support Bilateral upper extremity supported;During functional activity  Standing balance-Leahy Scale Poor  Standing balance comment reliant on external support and/or AD  PT - End of Session  Equipment Utilized During Treatment Oxygen  Activity Tolerance Patient tolerated treatment well;Patient limited by fatigue  Patient left in bed;with call bell/phone within reach;with bed alarm set  Nurse Communication Mobility status   PT - Assessment/Plan  PT Plan Current plan remains appropriate  PT Visit Diagnosis Unsteadiness on feet (R26.81);Other abnormalities of gait and mobility (R26.89);Muscle weakness (generalized) (M62.81);Difficulty in walking, not elsewhere classified (R26.2)  PT Frequency (ACUTE ONLY) Min 3X/week  Follow Up Recommendations Skilled nursing-short term rehab (<3 hours/day)  Assistance recommended at discharge Intermittent Supervision/Assistance  PT equipment None recommended by PT (TBA)  AM-PAC PT "6 Clicks" Mobility Outcome Measure (Version 2)  Help needed turning from your back to your side while in a flat bed without using bedrails? 2  Help needed moving from lying on your back to sitting on the side of a flat bed without using bedrails? 2  Help needed moving to and from a bed to a chair (including a wheelchair)? 2  Help needed standing up from a chair using your arms (e.g., wheelchair or bedside chair)? 2  Help needed to walk in hospital room? 1  Help needed climbing 3-5 steps with a railing?  1  6 Click Score 10  Consider Recommendation of Discharge To: CIR/SNF/LTACH  Progressive Mobility  What is the highest level of mobility based on the progressive mobility assessment? Level 3 (Stands with assist) -  Balance while standing  and cannot march in place  Mobility Out of bed to chair with  meals  PT Goal Progression  Progress towards PT goals Progressing toward goals  PT Time Calculation  PT Start Time (ACUTE ONLY) 1726  PT Stop Time (ACUTE ONLY) 1744  PT Time Calculation (min) (ACUTE ONLY) 18 min  PT General Charges  $$ ACUTE PT VISIT 1 Visit  PT Treatments  $Therapeutic Activity 8-22 mins   04/06/2021  Ginger Carne., PT Acute Rehabilitation Services [phone]  (pager) [phone]  (office)

## 2021-04-06 NOTE — Evaluation (Signed)
Occupational Therapy Evaluation Patient Details Name: Raymond Allen MRN: [id number] DOB: 26-Oct-1960 Today's Date: 04/06/2021   History of Present Illness Pt is 60 yo male who presented with several weeks of dyspnea and edema. Found to have hypervolemia with pulmonary edema.  Failed Bipap and required intubation. Extubated on 12/20.DM type 2, DVT on xarelto, HTN, Colon cancer s/p partial colectomy 2001 with liver mets s/p Lt hepatectomy, CKD 3a, ED.   Clinical Impression   Pt admitted with the above diagnoses and presents with below problem list. Pt will benefit from continued acute OT to address the below listed deficits and maximize independence with basic ADLs prior to d/c to venue below. At baseline, pt reports he is independent with ADLs, endorses 1 fall this year that occurred outside. Pt presents with generalized weakness, decreased activity tolerance, impaired balance, and some cognitive deficits impacting assist level and safety with ADLs. Pt on 1 L/min O2 for OOB activity per pt request "I need my oxygen." On RA at start and end of session. Maintained O2 >90. RR 26, up to 35 while trying to have bowel movement on BSC.       Recommendations for follow up therapy are one component of a multi-disciplinary discharge planning process, led by the attending physician.  Recommendations may be updated based on patient status, additional functional criteria and insurance authorization.   Follow Up Recommendations  Skilled nursing-short term rehab (<3 hours/day)    Assistance Recommended at Discharge Frequent or constant Supervision/Assistance  Functional Status Assessment  Patient has had a recent decline in their functional status and demonstrates the ability to make significant improvements in function in a reasonable and predictable amount of time.  Equipment Recommendations  Other (comment) (defer to next venue)    Recommendations for Other Services PT consult     Precautions /  Restrictions Precautions Precautions: Fall Precaution Comments: decreased awareness of deficits + a bit impulsive Restrictions Weight Bearing Restrictions: No      Mobility Bed Mobility Overal bed mobility: Needs Assistance Bed Mobility: Supine to Sit;Sit to Supine     Supine to sit: Mod assist;HOB elevated Sit to supine: Mod assist;+2 for physical assistance;HOB elevated   General bed mobility comments: Extra time and effort. Sequencing cues. Cues for safety (ie not to scoot too far out). Using whole body movements, rails, and momentum to advance to EOB.    Transfers Overall transfer level: Needs assistance Equipment used: 2 person hand held assist Transfers: Sit to/from Stand;Bed to chair/wheelchair/BSC Sit to Stand: Mod assist;+2 physical assistance;Max assist     Step pivot transfers: Mod assist;Max assist;+2 physical assistance     General transfer comment: EOB<>BSC. assist for all aspects, support of left knee d/t buckling in standing. Cues for sequencing and safety. Fatigued on second step-pivot transfer when returning to bed from [facility]. Poor insight into deficits.      Balance Overall balance assessment: Needs assistance Sitting-balance support: Bilateral upper extremity supported;Feet supported Sitting balance-Leahy Scale: Fair Sitting balance - Comments: BUE support needed in static sitting   Standing balance support: Bilateral upper extremity supported;During functional activity Standing balance-Leahy Scale: Poor Standing balance comment: needs BUE support and mod A in static standing.                           ADL either performed or assessed with clinical judgement   ADL Overall ADL's : Needs assistance/impaired Eating/Feeding: NPO   Grooming: Maximal assistance;Sitting   Upper Body  Bathing: Maximal assistance;Sitting   Lower Body Bathing: Total assistance;Maximal assistance;+2 for physical assistance;Sit to/from stand Lower Body Bathing  Details (indicate cue type and reason): mod A +2 to stand, total A for bathing task Upper Body Dressing : Maximal assistance;Sitting   Lower Body Dressing: Maximal assistance;Total assistance;+2 for physical assistance;Sit to/from stand Lower Body Dressing Details (indicate cue type and reason): mod A +2 to stand, total A for dressing task Toilet Transfer: Maximal assistance;+2 for physical assistance   Toileting- Clothing Manipulation and Hygiene: Maximal assistance;Total assistance;Sit to/from stand         General ADL Comments: Pt completed step-pivot transfer to [facility] Impulsive and decreased insight into deficits.     Vision Baseline Vision/History: 1 Wears glasses       Perception     Praxis      Pertinent Vitals/Pain Pain Assessment: No/denies pain     Hand Dominance     Extremity/Trunk Assessment Upper Extremity Assessment Upper Extremity Assessment: Generalized weakness   Lower Extremity Assessment Lower Extremity Assessment: Defer to PT evaluation;Generalized weakness;LLE deficits/detail LLE Deficits / Details: L knee buckling in standing   Cervical / Trunk Assessment Cervical / Trunk Assessment: Other exceptions Cervical / Trunk Exceptions: large body habitus   Communication Communication Communication: No difficulties   Cognition Arousal/Alertness: Awake/alert (drowsy) Behavior During Therapy: Flat affect;Impulsive Overall Cognitive Status: No family/caregiver present to determine baseline cognitive functioning                                 General Comments: A&O to person, place, time. Tangential speech. Intellectual awareness level. Focused attention.Decreased awareness of deficits     General Comments  Pt on 1 L/min O2 for OOB activity per pt request "I need my oxygen." On RA at start and end of session. Maintained O2 >90. RR 26, up to 35 while trying to have bowel movement on [facility]    Exercises     Shoulder Instructions      Home  Living Family/patient expects to be discharged to:: Private residence Living Arrangements: Alone Available Help at Discharge: Other (Comment) (girlfriend) Type of Home: House Home Access: Stairs to enter [employer] of Steps: 1   Home Layout: One level     Bathroom Shower/Tub: Teacher, early years/pre: Handicapped height     Home Equipment: Conservation officer, nature (2 wheels);Cane - single point;IT sales professional (4 wheels)   Additional Comments: girlfriend works full-time at [employer]      Prior Functioning/Environment Prior Level of Function : Independent/Modified Independent             Mobility Comments: endorses 1 fall this year, outside in the yard ADLs Comments: drives, does his own shopping        OT Problem List: Decreased strength;Decreased activity tolerance;Impaired balance (sitting and/or standing);Decreased cognition;Decreased safety awareness;Decreased knowledge of use of DME or AE;Decreased knowledge of precautions;Cardiopulmonary status limiting activity;Obesity      OT Treatment/Interventions: Self-care/ADL training;DME and/or AE instruction;Balance training;Patient/family education;Cognitive remediation/compensation;Therapeutic activities;Energy conservation;Therapeutic exercise    OT Goals(Current goals can be found in the care plan section) Acute Rehab OT Goals Patient Stated Goal: have a bowel movement. regain independence OT Goal Formulation: With patient Time For Goal Achievement: 04/19/21 Potential to Achieve Goals: Good ADL Goals Pt Will Perform Grooming: with min assist;sitting Pt Will Perform Upper Body Dressing: with min assist;sitting Pt Will Perform Lower Body Dressing: with mod assist;sit to/from stand Pt Will  Transfer to Toilet: with mod assist;stand pivot transfer Pt/caregiver will Perform Home Exercise Program: Both right and left upper extremity;With theraband;With written HEP provided;With Supervision Additional ADL  Goal #1: Pt will complete bed mobility at min A level to prepare for EOB/OOB ADLs.  OT Frequency: Min 2X/week   Barriers to D/C:    lives alone. girlfriend works Archivist OT "6 Clicks" Daily Activity     Outcome Measure Help from another person eating meals?: Total Help from another person taking care of personal grooming?: A Lot Help from another person toileting, which includes using toliet, bedpan, or urinal?: Total Help from another person bathing (including washing, rinsing, drying)?: Total Help from another person to put on and taking off regular upper body clothing?: A Lot Help from another person to put on and taking off regular lower body clothing?: Total 6 Click Score: 8   End of Session Equipment Utilized During Treatment: Other (comment) (+2 HHA, needs walker) Nurse Communication: Mobility status;Other (comment) (RN in the room for transfers and bed mobility)  Activity Tolerance: Patient limited by fatigue Patient left: in bed;with call bell/phone within reach;with bed alarm set  OT Visit Diagnosis: Unsteadiness on feet (R26.81);Muscle weakness (generalized) (M62.81);History of falling (Z91.81);Other symptoms and signs involving cognitive function                Time: 7371-0626 OT Time Calculation (min): 60 min Charges:  OT General Charges $OT Visit: 1 Visit OT Evaluation $OT Eval High Complexity: 1 High OT Treatments $Self Care/Home Management : 38-52 mins  Tyrone Schimke, OT Acute Rehabilitation Services Office: [phone]   Hortencia Pilar 04/06/2021, 11:59 AM

## 2021-04-07 ENCOUNTER — Encounter (HOSPITAL_COMMUNITY): Payer: Self-pay

## 2021-04-07 ENCOUNTER — Other Ambulatory Visit (HOSPITAL_COMMUNITY): Payer: Self-pay

## 2021-04-07 DIAGNOSIS — I509 Heart failure, unspecified: Secondary | ICD-10-CM | POA: Diagnosis not present

## 2021-04-07 DIAGNOSIS — R0602 Shortness of breath: Secondary | ICD-10-CM | POA: Diagnosis not present

## 2021-04-07 LAB — GLUCOSE, CAPILLARY
Glucose-Capillary: 150 mg/dL — ABNORMAL HIGH (ref 70–99)
Glucose-Capillary: 147 mg/dL — ABNORMAL HIGH (ref 70–99)
Glucose-Capillary: 160 mg/dL — ABNORMAL HIGH (ref 70–99)
Glucose-Capillary: 302 mg/dL — ABNORMAL HIGH (ref 70–99)
Glucose-Capillary: 141 mg/dL — ABNORMAL HIGH (ref 70–99)

## 2021-04-07 LAB — BASIC METABOLIC PANEL
Anion gap: 6 (ref 5–15)
BUN: 16 mg/dL (ref 8–23)
CO2: 28 mmol/L (ref 22–32)
Calcium: 8.7 mg/dL — ABNORMAL LOW (ref 8.9–10.3)
Chloride: 101 mmol/L (ref 98–111)
Creatinine, Ser: 0.95 mg/dL (ref 0.61–1.24)
GFR, Estimated: >60 mL/min (ref >60)
Glucose, Bld: 158 mg/dL — ABNORMAL HIGH (ref 70–99)
Potassium: 3.5 mmol/L (ref 3.5–5.1)
Sodium: 135 mmol/L (ref 135–145)

## 2021-04-07 LAB — CBC
HCT: 42.8 % (ref 39.0–52.0)
Hemoglobin: 12 g/dL — ABNORMAL LOW (ref 13.0–17.0)
MCH: 23.8 pg — ABNORMAL LOW (ref 26.0–34.0)
MCHC: 28 g/dL — ABNORMAL LOW (ref 30.0–36.0)
MCV: 84.9 fL (ref 80.0–100.0)
Platelets: 325 10*3/uL (ref 150–400)
RBC: 5.04 MIL/uL (ref 4.22–5.81)
RDW: 15.8 % — ABNORMAL HIGH (ref 11.5–15.5)
WBC: 6.9 10*3/uL (ref 4.0–10.5)
nRBC: 0 % (ref 0.0–0.2)

## 2021-04-07 MED ORDER — FENTANYL CITRATE PF 50 MCG/ML IJ SOSY: 12.5000 ug | PREFILLED_SYRINGE | INTRAMUSCULAR | Status: DC | PRN

## 2021-04-07 MED ORDER — AMIODARONE HCL 200 MG PO TABS
200.0000 mg | ORAL_TABLET | Freq: Two times a day (BID) | ORAL | Status: DC
  Administered 2021-04-07 – 2021-04-11 (×8): 200 mg via ORAL
  Filled 2021-04-07 (×8): qty 1

## 2021-04-07 MED ORDER — CHLORHEXIDINE GLUCONATE 0.12 % MT SOLN
15.0000 mL | Freq: Two times a day (BID) | OROMUCOSAL | Status: DC
  Administered 2021-04-07 – 2021-04-10 (×5): 15 mL via OROMUCOSAL
  Filled 2021-04-07 (×8): qty 15

## 2021-04-07 MED ORDER — POTASSIUM CHLORIDE CRYS ER 20 MEQ PO TBCR
60.0000 meq | EXTENDED_RELEASE_TABLET | Freq: Once | ORAL | Status: AC
  Administered 2021-04-07: 12:00:00 60 meq via ORAL
  Filled 2021-04-07: qty 3

## 2021-04-07 MED ORDER — INSULIN ASPART 100 UNIT/ML IJ SOLN
0.0000 [IU] | Freq: Three times a day (TID) | INTRAMUSCULAR | Status: DC
  Administered 2021-04-08: 07:00:00 3 [IU] via SUBCUTANEOUS
  Administered 2021-04-08 – 2021-04-09 (×3): 4 [IU] via SUBCUTANEOUS
  Administered 2021-04-10: 13:00:00 3 [IU] via SUBCUTANEOUS
  Administered 2021-04-10 – 2021-04-11 (×3): 4 [IU] via SUBCUTANEOUS

## 2021-04-07 MED ORDER — FAMOTIDINE 20 MG PO TABS
20.0000 mg | ORAL_TABLET | Freq: Every day | ORAL | Status: DC
  Administered 2021-04-07 – 2021-04-11 (×5): 20 mg via ORAL
  Filled 2021-04-07 (×5): qty 1

## 2021-04-07 MED ORDER — POTASSIUM CHLORIDE CRYS ER 20 MEQ PO TBCR
40.0000 meq | EXTENDED_RELEASE_TABLET | Freq: Once | ORAL | Status: AC
  Administered 2021-04-07: 17:00:00 40 meq via ORAL
  Filled 2021-04-07: qty 2

## 2021-04-07 MED ORDER — DAPAGLIFLOZIN PROPANEDIOL 10 MG PO TABS
10.0000 mg | ORAL_TABLET | Freq: Every day | ORAL | Status: DC
  Administered 2021-04-07 – 2021-04-11 (×5): 10 mg via ORAL
  Filled 2021-04-07 (×5): qty 1

## 2021-04-07 NOTE — TOC Benefit Eligibility Note (Signed)
Patient Teacher, English as a foreign language completed.    The patient is currently admitted and upon discharge could be taking Jardiance 10 mg.  The current 30 day co-pay is, $0.00.   The patient is currently admitted and upon discharge could be taking Farxiga 10 mg.  The current 30 day co-pay is, $0.00.   The patient is currently admitted and upon discharge could be taking Entresto 24-26 mg.  The current 30 day co-pay is, $0.00.   The patient is insured through [place], [place] Patient Advocate Specialist [place] Patient Advocate Team Direct Number: [phone]  Fax: [phone]

## 2021-04-07 NOTE — Progress Notes (Addendum)
Patient ID: Raymond Allen, male   DOB: 1960-10-20, 60 y.o.   MRN: [id number]     Advanced Heart Failure Rounding Note  PCP-Cardiologist: Dr. Smith/Dr. Haroldine Laws    Subjective:    60/ y/o male with DM2, HTN, former smoker, remote h/o colon cancer -> liver CA (2003) admitted with acute respiratory failure and pulmonary edema.  Completed ceftriaxone for Strep aglactiae PNA. PCT 118 => 0.22 (?)   Found to have new onset CM EF 25-30%. Subsequently developed AF with RVR.   12/20 Extubated  Is/Os likely not accurate. Weight down 1 lb, 21 lb from admit  Scr stable, K 3.5  Had bowel movement yesterday. NGT out.  Very weak. Denies dyspnea and CP    Objective:   Weight Range: 121.5 kg Body mass index is 35.34 kg/m.   Vital Signs:   Temp:  [97.7 F (36.5 C)-98.3 F (36.8 C)] 97.7 F (36.5 C) (12/22 0738) Pulse Rate:  [80-98] 85 (12/22 0915) Resp:  [15-32] 19 (12/22 0800) BP: (106-146)/(75-95) 128/85 (12/22 0800) SpO2:  [86 %-100 %] 97 % (12/22 0800) Weight:  [121.5 kg] 121.5 kg (12/22 0500) Last BM Date: 04/06/21  Weight change: Filed Weights   04/05/21 0500 04/06/21 0500 04/07/21 0500  Weight: 125.7 kg 121.6 kg 121.5 kg    Intake/Output:   Intake/Output Summary (Last 24 hours) at 04/07/2021 1031 Last data filed at 04/07/2021 0924 Gross per 24 hour  Intake --  Output 3200 ml  Net -3200 ml      Physical Exam  General:  Very weak appearing HEENT: normal Neck: supple. Jvp 8 cm. Carotids 2+ bilat; no bruits. No lymphadenopathy or thryomegaly appreciated. Cor: PMI nondisplaced. Regular rate & rhythm. No rubs, gallops or murmurs. Lungs: clear Abdomen: soft, nontender, nondistended. No hepatosplenomegaly. + bowel sounds Extremities: no cyanosis, clubbing, rash, trace edema Neuro: alert & oriented x 3. Much less confused    Telemetry   SR 80s (personally reviewed)  Labs    CBC Recent Labs    04/05/21 0340 04/06/21 0355 04/07/21 0409  WBC 6.6 7.5 6.9   NEUTROABS 4.4  --   --   HGB 11.3* 12.1* 12.0*  HCT 37.9* 42.3 42.8  MCV 83.7 84.6 84.9  PLT 269 287 762   Basic Metabolic Panel Recent Labs    04/05/21 0340 04/06/21 0355 04/07/21 0409  NA 139 139 135  K 4.0 4.1 3.5  CL 106 103 101  CO2 '27 29 28  ' GLUCOSE 228* 243* 158*  BUN 38* 26* 16  CREATININE 1.18 1.07 0.95  CALCIUM 9.0 9.3 8.7*  MG 2.3  --   --    Liver Function Tests No results for input(s): AST, ALT, ALKPHOS, BILITOT, PROT, ALBUMIN in the last 72 hours. No results for input(s): LIPASE, AMYLASE in the last 72 hours. Cardiac Enzymes No results for input(s): CKTOTAL, CKMB, CKMBINDEX, TROPONINI in the last 72 hours.  BNP: BNP (last 3 results) Recent Labs    03/29/21 0502  BNP 391.6*    ProBNP (last 3 results) No results for input(s): PROBNP in the last 8760 hours.   D-Dimer No results for input(s): DDIMER in the last 72 hours. Hemoglobin A1C No results for input(s): HGBA1C in the last 72 hours.  Fasting Lipid Panel Recent Labs    04/05/21 0340  TRIG 123   Thyroid Function Tests No results for input(s): TSH, T4TOTAL, T3FREE, THYROIDAB in the last 72 hours.  Invalid input(s): FREET3   Other results:   Imaging  No results found.   Medications:     Scheduled Medications:  amiodarone  400 mg Oral BID   chlorhexidine gluconate (MEDLINE KIT)  15 mL Mouth Rinse BID   Chlorhexidine Gluconate Cloth  6 each Topical Daily   digoxin  0.125 mg Oral Daily   docusate sodium  100 mg Oral BID   famotidine  20 mg Oral Daily   feeding supplement  237 mL Oral BID BM   furosemide  60 mg Intravenous BID   gabapentin  600 mg Oral BID WC   gabapentin  900 mg Oral QHS   insulin aspart  0-20 Units Subcutaneous TID WC   insulin aspart  0-5 Units Subcutaneous QHS   insulin glargine-yfgn  15 Units Subcutaneous Daily   lactulose  20 g Oral BID   rivaroxaban  20 mg Oral Q supper   rosuvastatin  20 mg Oral Daily   sacubitril-valsartan  1 tablet Oral BID    sodium chloride flush  10-40 mL Intracatheter Q12H   sodium chloride flush  3 mL Intravenous Q12H   spironolactone  12.5 mg Oral Daily    Infusions:  sodium chloride      PRN Medications: Place/Maintain arterial line **AND** sodium chloride, acetaminophen, bisacodyl, cyclobenzaprine, docusate sodium, fentaNYL (SUBLIMAZE) injection, metoCLOPramide (REGLAN) injection, ondansetron (ZOFRAN) IV, polyethylene glycol, sodium chloride flush   Assessment/Plan   1. Acute Systolic Heart Failure (New)  - Admitted w/ acute pulmonary edema/ hypoxic respiratory failure  - Echo LVEF 25-30%, diffuse HK, RV mildly reduced. No prior study for comparison - Etiology uncertain. H/o chemotherapy w/ Fluoropyrimidines (potentially cardiotoxic) but this was remotely. Off chemo since 2003 - Cath 12/16 minimal CAD. Preserved CO. Low SVR - Hopefully ready for cMRI tomorrow - Still had some pulmonary edema on CXR yesterday. Diuresing with IV lasix 60 BID, plan to switch to po diuretic tomorrow. Central line out, no CVP. - Start jardiance or farxiga, PharmD checking copay - Continue spironolactone 12.5 daily.  - Continue digoxin 0.125  - Continue entresto 24/26 mg BID - No b-blocker yet  2. Septic Shock  - Afebrile  - PCT 0.32 -> 118 -> 0.22 (?)  - Resolved, stable BP.  - Completed ceftriaxone for Strep agalactiae.  - No change   3. Acute Hypoxic Respiratory Failure - 2/2 acute pulmonary edema and PNA - intubated, vent management per PCCM  - extubated 12/20 - O2 stable on 2L Pillow   4. Mitral Regurgitation  - Mod-severe on echo. No significant v-waves in PCWP tracing on cath - Suspect functional in setting of severely dilated LV  - Doubt this is major driver currently. Can repeat echo prior to d/c and consider TEE as needed   5. AKI on CKD Stage II due to shock - Baseline SCr ~1.2  - Scr stable actually improved to 0.95 today   6. Hypokalemia -  K 3.5 - Supp  - Aim to keep > 4.0   7. Type 2DM   - Hgb A1c 7.7  - Consider SGLT2i initiation prior to d/c   8. Atrial Tach vs AFlutter w/ RVR - On po amio 400 mg BID, decrease to 200 mg BID - On Xarelto 20   Length of Stay: [place], [place], PA-C  04/07/2021, 10:31 AM  Advanced Heart Failure Team Pager [phone] (M-F; 7a - 5p)  Please contact [facility] for night-coverage after hours (5p -7a ) and weekends on [email or web address]  Patient seen and examined with the above-signed Advanced Practice Provider  and/or Housestaff. I personally reviewed laboratory data, imaging studies and relevant notes. I independently examined the patient and formulated the important aspects of the plan. I have edited the note to reflect any of my changes or salient points. I have personally discussed the plan with the patient and/or family.  Remains weak. Denies CP, SOB, orthopnea or PND.   General:  Sitting up  No resp difficulty HEENT: normal Neck: supple. JVP 7. Carotids 2+ bilat; no bruits. No lymphadenopathy or thryomegaly appreciated. Cor: PMI nondisplaced. Regular rate & rhythm. No rubs, gallops or murmurs. Lungs: coarse Abdomen: soft, nontender, nondistended. No hepatosplenomegaly. No bruits or masses. Good bowel sounds. Extremities: no cyanosis, clubbing, rash, edema Neuro: alert & orientedx3, cranial nerves grossly intact. moves all 4 extremities w/o difficulty. Affect pleasant  Making slow progress. Will continue IV diuresis one more day. Supp K. Remains in NSR. Switch amio to 200 bid. Titrate GDMT as above. cMRI hopefully tomorrow.   Glori Bickers, MD  5:00 PM

## 2021-04-07 NOTE — Progress Notes (Signed)
PROGRESS NOTE                                                                                                                                                                                                             Patient Demographics:    Raymond Allen, is a 60 y.o. male, DOB - 1960-12-01, HYQ:[id number]  Outpatient Primary MD for the patient is [place], Julie M, NP    LOS - 9  Admit date - 03/29/2021    Chief Complaint  Patient presents with   Shortness of Breath       Brief Narrative (HPI from H&P)  - 60 yo male former smoker, H/O DM type 2, DVT on xarelto, HTN, Colon cancer s/p partial colectomy 2001 with liver mets s/p Lt hepatectomy, CKD 3a, ED presented with several weeks of dyspnea and edema.  Was diagnosed with acute hypoxic respiratory failure due to CHF he was intubated kept in ICU, CHF team also was involved.  He was extubated on 04/05/2021 and transferred to hospitalist service on 04/07/2021 on day 9 of his hospital stay.   Subjective:    [name] today has, No headache, No chest pain, No abdominal pain - No Nausea, No new weakness tingling or numbness, mild SOB, ++ weakness   Assessment  & Plan :     Acute Hypoxic Resp. Failure due to Acute on chronic systolic CHF with EF 95% in the presence of mitral regurgitation and dilated cardiomyopathy. He required intubation for several days, extubated on 04/05/2021, being diuresed with IV Lasix, also has Entresto, Aldactone, amiodarone, digoxin on board.  CHF team following.  Not on beta-blocker yet will defer that to CHF team.  Per cardiology notes may require cardiac MRI as well.  Respiratory failure improving currently on nasal cannula oxygen continue close monitoring with ongoing diuresis.  No CHF or infection.  2.  AKI due to CHF.  Resolved.  No CKD.  3.  Acute metabolic encephalopathy in the setting of ICU and respiratory failure.  Briefly required  Precedex.  Much improved now off of sedation.  Avoid benzodiazepines and narcotics.  Continue to monitor and advance activity.  4. Obesity.  BMI of 35.  Follow with PCP for weight loss.   5.  A flutter with [place] vas 2 score of 4.  Currently on p.o. amiodarone with Xarelto.  Cardiology on board.  6.  Moderate to severe MR.  Defer to cardiology.   7. Dyslipidemia.  On statin continue.   8. DM type II.  [place] on Lantus, sliding scale will monitor and adjust.  Lab Results  Component Value Date   HGBA1C 7.7 (H) 03/29/2021    CBG (last 3)  Recent Labs    04/06/21 2334 04/07/21 0321 04/07/21 0736  GLUCAP 164* 150* 147*         Condition - Extremely Guarded  Family Communication  : Girlfriend Carleene Cooper [phone] on 04/07/2021  Code Status :  Full  Consults  :  PCCM, Cards  PUD Prophylaxis : Pepcid added   Procedures  :      12/13 Admit, cardiology consulted, intubated. Echo 03/29/21 >> EF 25 to 30%, RVSP 38 mmHg, mod LA dilation, mod/severe MR 12/15 febrile 12/16 poor mental status, RHC/LHC with non-obstructive CAD, mean PA elevated with normal PVR, preserved CO. Heart cath with preserved CO, low SVR suggestive of distributive shock 12/17 follows commands 12/19 completed ceftriaxone. Off all pressors  12/20 Extubated Transferred to [facility]      Disposition Plan  :    Status is: Inpatient  Remains inpatient appropriate because: CHF   DVT Prophylaxis  :    SCDs Start: 03/29/21 0732 rivaroxaban (XARELTO) tablet 20 mg     Lab Results  Component Value Date   PLT 325 04/07/2021    Diet :  Diet Order             Diet full liquid Room service appropriate? Yes; Fluid consistency: Thin  Diet effective now                    Inpatient Medications  Scheduled Meds:  amiodarone  400 mg Oral BID   chlorhexidine gluconate (MEDLINE KIT)  15 mL Mouth Rinse BID   Chlorhexidine Gluconate Cloth  6 each Topical Daily   digoxin  0.125 mg Oral Daily   docusate  sodium  100 mg Oral BID   feeding supplement  237 mL Oral BID BM   furosemide  60 mg Intravenous BID   gabapentin  600 mg Oral BID WC   gabapentin  900 mg Oral QHS   insulin aspart  0-20 Units Subcutaneous TID WC   insulin aspart  0-5 Units Subcutaneous QHS   insulin glargine-yfgn  15 Units Subcutaneous Daily   lactulose  20 g Oral BID   rivaroxaban  20 mg Oral Q supper   rosuvastatin  20 mg Oral Daily   sacubitril-valsartan  1 tablet Oral BID   sodium chloride flush  10-40 mL Intracatheter Q12H   sodium chloride flush  3 mL Intravenous Q12H   spironolactone  12.5 mg Oral Daily   Continuous Infusions:  sodium chloride     PRN Meds:.Place/Maintain arterial line **AND** sodium chloride, acetaminophen, bisacodyl, cyclobenzaprine, docusate sodium, fentaNYL (SUBLIMAZE) injection, metoCLOPramide (REGLAN) injection, ondansetron (ZOFRAN) IV, polyethylene glycol, sodium chloride flush  Antibiotics  :    Anti-infectives (From admission, onward)    Start     Dose/Rate Route Frequency Ordered Stop   04/05/21 1330  cefTRIAXone (ROCEPHIN) 1 g in sodium chloride 0.9 % 100 mL IVPB        1 g 200 mL/hr over 30 Minutes Intravenous  Once 04/05/21 1240 04/05/21 1351   04/01/21 1300  cefTRIAXone (ROCEPHIN) 1 g in sodium chloride 0.9 % 100 mL IVPB        1 g 200 mL/hr over  30 Minutes Intravenous Every 24 hours 04/01/21 1202 04/05/21 1346        Time Spent in minutes  30   Lala Lund Allen.D on 04/07/2021 at 9:39 AM  To page go to [email or web address]   [facility] -  Office  [phone]  See all Orders from today for further details    Objective:   Vitals:   04/07/21 0700 04/07/21 0738 04/07/21 0800 04/07/21 0915  BP: 116/82  128/85   Pulse: 81  80 85  Resp: 20  19   Temp:  97.7 F (36.5 C)    TempSrc:  Oral    SpO2:   97%   Weight:      Height:        Wt Readings from Last 3 Encounters:  04/07/21 121.5 kg  11/22/17 131.5 kg  07/28/14 104.3 kg     Intake/Output  Summary (Last 24 hours) at 04/07/2021 0939 Last data filed at 04/07/2021 9629 Gross per 24 hour  Intake 33.92 ml  Output 3200 ml  Net -3166.08 ml     Physical Exam  Awake Alert, No new F.N deficits, Normal affect [place].AT,PERRAL Supple Neck, No JVD,   Symmetrical Chest wall movement, Good air movement bilaterally, few rales RRR,No Gallops,Rubs or new Murmurs,  +ve B.Sounds, Abd Soft, No tenderness,   No Cyanosis, trace edema       Data Review:    CBC Recent Labs  Lab 04/03/21 0415 04/04/21 0256 04/05/21 0340 04/06/21 0355 04/07/21 0409  WBC 7.7 7.8 6.6 7.5 6.9  HGB 11.4* 11.5* 11.3* 12.1* 12.0*  HCT 39.7 39.3 37.9* 42.3 42.8  PLT 247 244 269 287 325  MCV 84.1 84.7 83.7 84.6 84.9  MCH 24.2* 24.8* 24.9* 24.2* 23.8*  MCHC 28.7* 29.3* 29.8* 28.6* 28.0*  RDW 15.9* 16.0* 16.2* 16.0* 15.8*  LYMPHSABS  --   --  1.2  --   --   MONOABS  --   --  0.6  --   --   EOSABS  --   --  0.3  --   --   BASOSABS  --   --  0.0  --   --     Electrolytes Recent Labs  Lab 04/01/21 0353 04/01/21 1634 04/02/21 0412 04/02/21 0815 04/03/21 0415 04/04/21 0256 04/05/21 0340 04/06/21 0355 04/07/21 0409  NA 136   < >  --  138 139 139 139 139 135  K 3.6   < >  --  3.2* 4.0 4.0 4.0 4.1 3.5  CL 99  --   --  99 103 103 106 103 101  CO2 28  --   --  '30 28 30 27 29 28  ' GLUCOSE 232*  --   --  218* 218* 212* 228* 243* 158*  BUN 23  --   --  23 30* 35* 38* 26* 16  CREATININE 1.12  --   --  1.11 1.18 1.12 1.18 1.07 0.95  CALCIUM 8.7*  --   --  8.8* 8.8* 9.0 9.0 9.3 8.7*  MG  --   --   --  1.8 2.1  --  2.3  --   --   PROCALCITON 118.03  --  0.22  --   --   --   --   --   --    < > = values in this interval not displayed.    ------------------------------------------------------------------------------------------------------------------ Recent Labs    04/05/21 0340  TRIG 123    Lab Results  Component Value Date   HGBA1C 7.7 (H) 03/29/2021     Radiology Reports DG Abd 1  View  Result Date: 04/05/2021 CLINICAL DATA:  Check gastric catheter placement EXAM: ABDOMEN - 1 VIEW COMPARISON:  Film from earlier in the same day. FINDINGS: Gastric catheter is again identified within the stomach. No obstructive changes are seen. Postsurgical changes on the right are noted. No free air is seen. IMPRESSION: Gastric catheter in the stomach. Electronically Signed   By: Inez Catalina Allen.D.   On: 04/05/2021 23:25   DG Chest Port 1 View  Result Date: 04/06/2021 CLINICAL DATA:  Pulmonary edema J81.1 (ICD-10-CM) EXAM: PORTABLE CHEST 1 VIEW COMPARISON:  04/01/2021. FINDINGS: Gastric tube courses below the diaphragm with the tip in the expected region the stomach. Side port is below the GE junction. Similar positioning of a left IJ central venous catheter with the tip projecting at the expected junction of the brachiocephalic vein and SVC. Endotracheal tube is no longer visualized, presumably related to extubation. Similar enlargement of the cardiac silhouette. Similar pulmonary vascular congestion. Similar patchy/heterogeneous opacities bilaterally. No definite pleural effusions or pneumothorax on this single AP semi erect radiograph. IMPRESSION: 1. Cardiomegaly and pulmonary vascular congestion. 2. Similar heterogeneous opacities bilaterally, possibly due to pulmonary edema or atelectasis. Electronically Signed   By: Margaretha Sheffield Allen.D.   On: 04/06/2021 08:18   DG Abd Portable 1V  Result Date: 04/05/2021 CLINICAL DATA:  NG tube placement. EXAM: PORTABLE ABDOMEN - 1 VIEW COMPARISON:  Abdominal x-ray 03/30/2021. FINDINGS: Nasogastric tube tip is in the proximal stomach. The stomach is dilated. There are surgical clips in the right abdomen. IMPRESSION: 1. Nasogastric tube tip in the proximal stomach. The stomach is dilated. Recommend advancing tube. Electronically Signed   By: Ronney Asters Allen.D.   On: 04/05/2021 22:34

## 2021-04-07 NOTE — TOC Progression Note (Addendum)
Transition of Care [facility]) - Progression Note    Patient Details  Name: Raymond Allen MRN: [id number] Date of Birth: [birth year]  [facility] CM/SW Contact  Kayana Thoen, LCSW Phone Number: 04/07/2021, 2:24 PM  Clinical Narrative:    HF CSW spoke with [name] at bedside to discuss the PT/OT recommendations at time of discharge for SNF. Mr. Bee reported that he wants to go home and not to a SNF as he has been away from his pets long enough and needs to care for things at his home. Due to holiday and concern for shut down [facility] supervisors requested for all needed PASRR to be put through. PASRR# [id number] A if [name] changes his mind for SNF. [name] is agreeable for the CSW to speak with his spouse as well. Treatment team waiting to work with [name] for ambulation and CSW left the room and encouraged [name] to consider his options and reach out as needed. HF CSW spoke with [name] partner, Carleene Cooper [phone] about the discharge plan and she reported that she is aware that he wants to go home and not to a SNF and supports him in that decision.   CSW will continue to follow throughout discharge.   Expected Discharge Plan: [place] Barriers to Discharge: Continued Medical Work up  Expected Discharge Plan and Services Expected Discharge Plan: [place] In-house Referral: Clinical Social Work Discharge Planning Services: CM Consult Post Acute Care Choice: [place] arrangements for the past 2 months: [place]: [place]: 04/06/21 Time [place]: [place] Representative spoke with at [place]: [place] (SDOH) Interventions    Readmission Risk Interventions No flowsheet data found.  Warnie Belair, MSW, [place] Heart Failure Social Worker

## 2021-04-08 DIAGNOSIS — R0602 Shortness of breath: Secondary | ICD-10-CM | POA: Diagnosis not present

## 2021-04-08 DIAGNOSIS — I509 Heart failure, unspecified: Secondary | ICD-10-CM | POA: Diagnosis not present

## 2021-04-08 LAB — CBC WITH DIFFERENTIAL/PLATELET
Abs Immature Granulocytes: 0.07 10*3/uL (ref 0.00–0.07)
Basophils Absolute: 0.1 10*3/uL (ref 0.0–0.1)
Basophils Relative: 1 %
Eosinophils Absolute: 0.3 10*3/uL (ref 0.0–0.5)
Eosinophils Relative: 3 %
HCT: 44.3 % (ref 39.0–52.0)
Hemoglobin: 12.7 g/dL — ABNORMAL LOW (ref 13.0–17.0)
Immature Granulocytes: 1 %
Lymphocytes Relative: 27 %
Lymphs Abs: 2.1 10*3/uL (ref 0.7–4.0)
MCH: 23.9 pg — ABNORMAL LOW (ref 26.0–34.0)
MCHC: 28.7 g/dL — ABNORMAL LOW (ref 30.0–36.0)
MCV: 83.3 fL (ref 80.0–100.0)
Monocytes Absolute: 0.7 10*3/uL (ref 0.1–1.0)
Monocytes Relative: 9 %
Neutro Abs: 4.6 10*3/uL (ref 1.7–7.7)
Neutrophils Relative %: 59 %
Platelets: 332 10*3/uL (ref 150–400)
RBC: 5.32 MIL/uL (ref 4.22–5.81)
RDW: 15.7 % — ABNORMAL HIGH (ref 11.5–15.5)
WBC: 7.9 10*3/uL (ref 4.0–10.5)
nRBC: 0 % (ref 0.0–0.2)

## 2021-04-08 LAB — GLUCOSE, CAPILLARY
Glucose-Capillary: 147 mg/dL — ABNORMAL HIGH (ref 70–99)
Glucose-Capillary: 170 mg/dL — ABNORMAL HIGH (ref 70–99)
Glucose-Capillary: 84 mg/dL (ref 70–99)
Glucose-Capillary: 125 mg/dL — ABNORMAL HIGH (ref 70–99)

## 2021-04-08 LAB — COMPREHENSIVE METABOLIC PANEL
ALT: 28 U/L (ref 0–44)
AST: 29 U/L (ref 15–41)
Albumin: 2.7 g/dL — ABNORMAL LOW (ref 3.5–5.0)
Alkaline Phosphatase: 52 U/L (ref 38–126)
Anion gap: 10 (ref 5–15)
BUN: 22 mg/dL (ref 8–23)
CO2: 25 mmol/L (ref 22–32)
Calcium: 9.5 mg/dL (ref 8.9–10.3)
Chloride: 99 mmol/L (ref 98–111)
Creatinine, Ser: 1.54 mg/dL — ABNORMAL HIGH (ref 0.61–1.24)
GFR, Estimated: 51 mL/min — ABNORMAL LOW (ref >60)
Glucose, Bld: 186 mg/dL — ABNORMAL HIGH (ref 70–99)
Potassium: 4.3 mmol/L (ref 3.5–5.1)
Sodium: 134 mmol/L — ABNORMAL LOW (ref 135–145)
Total Bilirubin: 1.1 mg/dL (ref 0.3–1.2)
Total Protein: 7.8 g/dL (ref 6.5–8.1)

## 2021-04-08 LAB — MAGNESIUM: Magnesium: 2 mg/dL (ref 1.7–2.4)

## 2021-04-08 MED ORDER — SPIRONOLACTONE 12.5 MG HALF TABLET: 12.5000 mg | ORAL_TABLET | Freq: Every day | ORAL | Status: DC

## 2021-04-08 MED ORDER — SACUBITRIL-VALSARTAN 24-26 MG PO TABS: 1.0000 | ORAL_TABLET | Freq: Two times a day (BID) | ORAL | Status: DC

## 2021-04-08 NOTE — Plan of Care (Signed)
  Problem: Education: Goal: Knowledge of General Education information will improve Description Including pain rating scale, medication(s)/side effects and non-pharmacologic comfort measures Outcome: Progressing   Problem: Health Behavior/Discharge Planning: Goal: Ability to manage health-related needs will improve Outcome: Progressing   

## 2021-04-08 NOTE — Progress Notes (Signed)
PROGRESS NOTE                                                                                                                                                                                                             Patient Demographics:    Raymond Allen, is a 60 y.o. male, DOB - [birth year], TXM:[id number]  Outpatient Primary MD for the patient is [place], Julie M, NP    LOS - 10  Admit date - 03/29/2021    Chief Complaint  Patient presents with   Shortness of Breath       Brief Narrative (HPI from H&P)  - 60 yo male former smoker, H/O DM type 2, DVT on xarelto, HTN, Colon cancer s/p partial colectomy 2001 with liver mets s/p Lt hepatectomy, CKD 3a, ED presented with several weeks of dyspnea and edema.  Was diagnosed with acute hypoxic respiratory failure due to CHF he was intubated kept in ICU, CHF team also was involved.  He was extubated on 04/05/2021 and transferred to hospitalist service on 04/07/2021 on day 9 of his hospital stay.   Subjective:   Patient in bed appears to be in no distress but feels quite weak and deconditioned, denies any headache, no chest or abdominal pain, improved shortness of breath.   Assessment  & Plan :     Acute Hypoxic Resp. Failure due to Acute on chronic systolic CHF with EF 48% in the presence of mitral regurgitation and dilated cardiomyopathy. He required intubation for several days, extubated on 04/05/2021, he has been adequately diuresed with IV Lasix, also has Entresto, Aldactone, amiodarone, digoxin on board.  CHF team following.  Not on beta-blocker yet will defer that to CHF team.  Per cardiology notes may require cardiac MRI as well.  Respiratory failure improving currently on nasal cannula oxygen continue close monitoring with ongoing diuresis.  No sepsis or infection.  Lasix and Delene Loll will be held on 04/08/2021 due to AKI.  2.  AKI due to CHF.  Initial AKI was due to fluid  overload now due to overdiuresis, hold Lasix and Entresto on 04/08/2021 and monitor.  3.  Acute metabolic encephalopathy in the setting of ICU and respiratory failure.  Briefly required Precedex.  Much improved now off of sedation.  Avoid benzodiazepines and narcotics.  Continue to monitor and advance activity.  4. Obesity.  BMI of 35.  Follow with PCP for weight loss.   5.  A flutter with [place] vas 2 score of 4.  Currently on p.o. amiodarone with Xarelto.  Cardiology on board.  6.  Moderate to severe MR.  Defer to cardiology.   7. Dyslipidemia.  On statin continue.   8.  Severe weakness and deconditioning.  PT OT.  May require placement.    9. DM type II.  [place] on Lantus, sliding scale will monitor and adjust.  Lab Results  Component Value Date   HGBA1C 7.7 (H) 03/29/2021    CBG (last 3)  Recent Labs    04/07/21 1522 04/07/21 2142 04/08/21 0620  GLUCAP 302* 141* 147*         Condition - Extremely Guarded  Family Communication  : Girlfriend Carleene Cooper [phone] on 04/07/2021  Code Status :  Full  Consults  :  PCCM, Cards  PUD Prophylaxis : Pepcid added   Procedures  :      12/13 Admit, cardiology consulted, intubated. Echo 03/29/21 >> EF 25 to 30%, RVSP 38 mmHg, mod LA dilation, mod/severe MR 12/15 febrile 12/16 poor mental status, RHC/LHC with non-obstructive CAD, mean PA elevated with normal PVR, preserved CO. Heart cath with preserved CO, low SVR suggestive of distributive shock 12/17 follows commands 12/19 completed ceftriaxone. Off all pressors  12/20 Extubated Transferred to [facility]      Disposition Plan  :    Status is: Inpatient  Remains inpatient appropriate because: CHF   DVT Prophylaxis  :    SCDs Start: 03/29/21 0732 rivaroxaban (XARELTO) tablet 20 mg     Lab Results  Component Value Date   PLT 332 04/08/2021    Diet :  Diet Order             Diet full liquid Room service appropriate? Yes; Fluid consistency: Thin  Diet effective  now                    Inpatient Medications  Scheduled Meds:  amiodarone  200 mg Oral BID   chlorhexidine  15 mL Mouth Rinse BID   dapagliflozin propanediol  10 mg Oral Daily   digoxin  0.125 mg Oral Daily   docusate sodium  100 mg Oral BID   famotidine  20 mg Oral Daily   feeding supplement  237 mL Oral BID BM   gabapentin  600 mg Oral BID WC   gabapentin  900 mg Oral QHS   insulin aspart  0-20 Units Subcutaneous TID WC   insulin aspart  0-5 Units Subcutaneous QHS   insulin glargine-yfgn  15 Units Subcutaneous Daily   lactulose  20 g Oral BID   rivaroxaban  20 mg Oral Q supper   rosuvastatin  20 mg Oral Daily   sodium chloride flush  3 mL Intravenous Q12H   Continuous Infusions:   PRN Meds:.acetaminophen, bisacodyl, cyclobenzaprine, docusate sodium, fentaNYL (SUBLIMAZE) injection, metoCLOPramide (REGLAN) injection, ondansetron (ZOFRAN) IV, polyethylene glycol  Antibiotics  :    Anti-infectives (From admission, onward)    Start     Dose/Rate Route Frequency Ordered Stop   04/05/21 1330  cefTRIAXone (ROCEPHIN) 1 g in sodium chloride 0.9 % 100 mL IVPB        1 g 200 mL/hr over 30 Minutes Intravenous  Once 04/05/21 1240 04/05/21 1351   04/01/21 1300  cefTRIAXone (ROCEPHIN) 1 g in sodium chloride 0.9 % 100 mL IVPB        1 g  200 mL/hr over 30 Minutes Intravenous Every 24 hours 04/01/21 1202 04/05/21 1346        Time Spent in minutes  30   Lala Lund M.D on 04/08/2021 at 10:24 AM  To page go to [email or web address]   [facility] -  Office  [phone]  See all Orders from today for further details    Objective:   Vitals:   04/08/21 0600 04/08/21 0630 04/08/21 0700 04/08/21 0801  BP: (!) 88/63 93/65 92/71  111/73  Pulse: 82 80 81 94  Resp: 18 16 16 20   Temp:    (!) 97.5 F (36.4 C)  TempSrc:    Oral  SpO2: 97% 97% 99% 100%  Weight:      Height:        Wt Readings from Last 3 Encounters:  04/08/21 119.7 kg  11/22/17 131.5 kg  07/28/14 104.3  kg     Intake/Output Summary (Last 24 hours) at 04/08/2021 1024 Last data filed at 04/08/2021 0300 Gross per 24 hour  Intake 830 ml  Output 1750 ml  Net -920 ml     Physical Exam  Awake Alert, No new F.N deficits, Normal affect [place].AT,PERRAL Supple Neck, No JVD,   Symmetrical Chest wall movement, Good air movement bilaterally, CTAB RRR,No Gallops, Rubs or new Murmurs,  +ve B.Sounds, Abd Soft, No tenderness,   No Cyanosis, Clubbing or edema       Data Review:    CBC Recent Labs  Lab 04/04/21 0256 04/05/21 0340 04/06/21 0355 04/07/21 0409 04/08/21 0322  WBC 7.8 6.6 7.5 6.9 7.9  HGB 11.5* 11.3* 12.1* 12.0* 12.7*  HCT 39.3 37.9* 42.3 42.8 44.3  PLT 244 269 287 325 332  MCV 84.7 83.7 84.6 84.9 83.3  MCH 24.8* 24.9* 24.2* 23.8* 23.9*  MCHC 29.3* 29.8* 28.6* 28.0* 28.7*  RDW 16.0* 16.2* 16.0* 15.8* 15.7*  LYMPHSABS  --  1.2  --   --  2.1  MONOABS  --  0.6  --   --  0.7  EOSABS  --  0.3  --   --  0.3  BASOSABS  --  0.0  --   --  0.1    Electrolytes Recent Labs  Lab 04/01/21 1641 04/02/21 0412 04/02/21 0815 04/03/21 0415 04/04/21 0256 04/05/21 0340 04/06/21 0355 04/07/21 0409 04/08/21 0322  NA  --   --  138 139 139 139 139 135 134*  K  --   --  3.2* 4.0 4.0 4.0 4.1 3.5 4.3  CL   < >  --  99 103 103 106 103 101 99  CO2   < >  --  30 28 30 27 29 28 25   GLUCOSE   < >  --  218* 218* 212* 228* 243* 158* 186*  BUN   < >  --  23 30* 35* 38* 26* 16 22  CREATININE   < >  --  1.11 1.18 1.12 1.18 1.07 0.95 1.54*  CALCIUM   < >  --  8.8* 8.8* 9.0 9.0 9.3 8.7* 9.5  AST  --   --   --   --   --   --   --   --  29  ALT  --   --   --   --   --   --   --   --  28  ALKPHOS  --   --   --   --   --   --   --   --  52  BILITOT  --   --   --   --   --   --   --   --  1.1  ALBUMIN  --   --   --   --   --   --   --   --  2.7*  MG  --   --  1.8 2.1  --  2.3  --   --  2.0  PROCALCITON  --  0.22  --   --   --   --   --   --   --    < > = values in this interval not displayed.     ------------------------------------------------------------------------------------------------------------------ No results for input(s): CHOL, HDL, LDLCALC, TRIG, CHOLHDL, LDLDIRECT in the last 72 hours.   Lab Results  Component Value Date   HGBA1C 7.7 (H) 03/29/2021     Radiology Reports  DG Abd 1 View  Result Date: 04/05/2021 CLINICAL DATA:  Check gastric catheter placement EXAM: ABDOMEN - 1 VIEW COMPARISON:  Film from earlier in the same day. FINDINGS: Gastric catheter is again identified within the stomach. No obstructive changes are seen. Postsurgical changes on the right are noted. No free air is seen. IMPRESSION: Gastric catheter in the stomach. Electronically Signed   By: Inez Catalina M.D.   On: 04/05/2021 23:25   DG Chest Port 1 View  Result Date: 04/06/2021 CLINICAL DATA:  Pulmonary edema J81.1 (ICD-10-CM) EXAM: PORTABLE CHEST 1 VIEW COMPARISON:  04/01/2021. FINDINGS: Gastric tube courses below the diaphragm with the tip in the expected region the stomach. Side port is below the GE junction. Similar positioning of a left IJ central venous catheter with the tip projecting at the expected junction of the brachiocephalic vein and SVC. Endotracheal tube is no longer visualized, presumably related to extubation. Similar enlargement of the cardiac silhouette. Similar pulmonary vascular congestion. Similar patchy/heterogeneous opacities bilaterally. No definite pleural effusions or pneumothorax on this single AP semi erect radiograph. IMPRESSION: 1. Cardiomegaly and pulmonary vascular congestion. 2. Similar heterogeneous opacities bilaterally, possibly due to pulmonary edema or atelectasis. Electronically Signed   By: Margaretha Sheffield M.D.   On: 04/06/2021 08:18   DG Abd Portable 1V  Result Date: 04/05/2021 CLINICAL DATA:  NG tube placement. EXAM: PORTABLE ABDOMEN - 1 VIEW COMPARISON:  Abdominal x-ray 03/30/2021. FINDINGS: Nasogastric tube tip is in the proximal stomach. The  stomach is dilated. There are surgical clips in the right abdomen. IMPRESSION: 1. Nasogastric tube tip in the proximal stomach. The stomach is dilated. Recommend advancing tube. Electronically Signed   By: Ronney Asters M.D.   On: 04/05/2021 22:34

## 2021-04-08 NOTE — Progress Notes (Addendum)
Patient ID: Raymond Allen, male   DOB: 13-Jun-1960, 60 y.o.   MRN: [id number]     Advanced Heart Failure Rounding Note  PCP-Cardiologist: Dr. Smith/Dr. Haroldine Laws    Subjective:   Admit weight 288--> today 263.9 pounds.   48/ y/o male with DM2, HTN, former smoker, remote h/o colon cancer -> liver CA (2003) admitted with acute respiratory failure and pulmonary edema.  Completed ceftriaxone for Strep aglactiae PNA. PCT 118 => 0.22 (?)   Found to have new onset CM EF 25-30%. Subsequently developed AF with RVR.   Yesterday diuresed with IV lasix and started on farxiga. Creatinine trending up 0.95>1.5. negative 1.7 liters.   Complaining of fatigue.      Objective:   Weight Range: 119.7 kg Body mass index is 34.82 kg/m.   Vital Signs:   Temp:  [97.1 F (36.2 C)-98.9 F (37.2 C)] 97.5 F (36.4 C) (12/23 0801) Pulse Rate:  [80-103] 94 (12/23 0801) Resp:  [13-29] 20 (12/23 0801) BP: (73-123)/(54-88) 111/73 (12/23 0801) SpO2:  [88 %-100 %] 100 % (12/23 0801) Weight:  [119.7 kg-121.9 kg] 119.7 kg (12/23 0220) Last BM Date: 04/06/21  Weight change: Filed Weights   04/07/21 1800 04/08/21 0000 04/08/21 0220  Weight: 121.9 kg 120.8 kg 119.7 kg    Intake/Output:   Intake/Output Summary (Last 24 hours) at 04/08/2021 0907 Last data filed at 04/08/2021 0300 Gross per 24 hour  Intake 830 ml  Output 2650 ml  Net -1820 ml      Physical Exam  General:  Appears weak.  No resp difficulty HEENT: normal Neck: supple. no JVD. Carotids 2+ bilat; no bruits. No lymphadenopathy or thryomegaly appreciated. Cor: PMI nondisplaced. Regular rate & rhythm. No rubs, gallops or murmurs. Lungs: clear Abdomen: soft, nontender, nondistended. No hepatosplenomegaly. No bruits or masses. Good bowel sounds. Extremities: no cyanosis, clubbing, rash, edema Neuro: alert & orientedx3, cranial nerves grossly intact. moves all 4 extremities w/o difficulty. Affect pleasant    Telemetry   SR 80-90s    Labs    CBC Recent Labs    04/07/21 0409 04/08/21 0322  WBC 6.9 7.9  NEUTROABS  --  4.6  HGB 12.0* 12.7*  HCT 42.8 44.3  MCV 84.9 83.3  PLT 325 017   Basic Metabolic Panel Recent Labs    04/07/21 0409 04/08/21 0322  NA 135 134*  K 3.5 4.3  CL 101 99  CO2 28 25  GLUCOSE 158* 186*  BUN 16 22  CREATININE 0.95 1.54*  CALCIUM 8.7* 9.5  MG  --  2.0   Liver Function Tests Recent Labs    04/08/21 0322  AST 29  ALT 28  ALKPHOS 52  BILITOT 1.1  PROT 7.8  ALBUMIN 2.7*   No results for input(s): LIPASE, AMYLASE in the last 72 hours. Cardiac Enzymes No results for input(s): CKTOTAL, CKMB, CKMBINDEX, TROPONINI in the last 72 hours.  BNP: BNP (last 3 results) Recent Labs    03/29/21 0502  BNP 391.6*    ProBNP (last 3 results) No results for input(s): PROBNP in the last 8760 hours.   D-Dimer No results for input(s): DDIMER in the last 72 hours. Hemoglobin A1C No results for input(s): HGBA1C in the last 72 hours.  Fasting Lipid Panel No results for input(s): CHOL, HDL, LDLCALC, TRIG, CHOLHDL, LDLDIRECT in the last 72 hours.  Thyroid Function Tests No results for input(s): TSH, T4TOTAL, T3FREE, THYROIDAB in the last 72 hours.  Invalid input(s): FREET3   Other results:  Imaging    No results found.   Medications:     Scheduled Medications:  amiodarone  200 mg Oral BID   chlorhexidine  15 mL Mouth Rinse BID   dapagliflozin propanediol  10 mg Oral Daily   digoxin  0.125 mg Oral Daily   docusate sodium  100 mg Oral BID   famotidine  20 mg Oral Daily   feeding supplement  237 mL Oral BID BM   gabapentin  600 mg Oral BID WC   gabapentin  900 mg Oral QHS   insulin aspart  0-20 Units Subcutaneous TID WC   insulin aspart  0-5 Units Subcutaneous QHS   insulin glargine-yfgn  15 Units Subcutaneous Daily   lactulose  20 g Oral BID   rivaroxaban  20 mg Oral Q supper   rosuvastatin  20 mg Oral Daily   sodium chloride flush  3 mL Intravenous Q12H     Infusions:    PRN Medications: acetaminophen, bisacodyl, cyclobenzaprine, docusate sodium, fentaNYL (SUBLIMAZE) injection, metoCLOPramide (REGLAN) injection, ondansetron (ZOFRAN) IV, polyethylene glycol   Assessment/Plan   1. Acute Systolic Heart Failure (New)  - Admitted w/ acute pulmonary edema/ hypoxic respiratory failure  - Echo LVEF 25-30%, diffuse HK, RV mildly reduced. No prior study for comparison - Etiology uncertain. H/o chemotherapy w/ Fluoropyrimidines (potentially cardiotoxic) but this was remotely. Off chemo since 2003 - Cath 12/16 minimal CAD. Preserved CO. Low SVR - Hopefully ready for cMRI tomorrow - Still had some pulmonary edema on CXR yesterday.  - Volume status improved. Creatinine 0.9>1.5 .  - Stop IV lasix. - Continue farxiga, $0.00 CO Pay - With AKI stop spiro and entresto.   - Continue digoxin 0.125  - No b-blocker yet  2. Septic Shock  - Afebrile  - PCT 0.32 -> 118 -> 0.22 (?)  - Resolved, stable BP.  - Completed ceftriaxone for Strep agalactiae.  - No change.    3. Acute Hypoxic Respiratory Failure - 2/2 acute pulmonary edema and PNA - intubated, vent management per PCCM  - extubated 12/20 - O2 stable on 2L [place]   4. Mitral Regurgitation  - Mod-severe on echo. No significant v-waves in PCWP tracing on cath - Suspect functional in setting of severely dilated LV  - Doubt this is major driver currently. Can repeat echo prior to d/c and consider TEE as needed   5. AKI on CKD Stage II due to shock - Baseline SCr ~1.2  - Scr stable actually improved to 0.95 but now up to 1.5 with diuresis.    6. Hypokalemia -  K 4.3  - Supp  - Aim to keep > 4.0   7. Type 2DM  - Hgb A1c 7.7  - Consider SGLT2i initiation prior to d/c   8. Atrial Tach vs AFlutter w/ RVR - In SR  -Continue amio 200 mg BID - On Xarelto 20  9. AKI - due to overdiuresis - hold diuretics and Entresto/spiro  TOC following for [facility]   Length of Stay: [place], NP   04/08/2021, 9:07 AM  Advanced Heart Failure Team Pager [phone] (M-F; 7a - 5p)  Please contact [facility] for night-coverage after hours (5p -7a ) and weekends on [email or web address]  Patient seen and examined with the above-signed Advanced Practice Provider and/or Housestaff. I personally reviewed laboratory data, imaging studies and relevant notes. I independently examined the patient and formulated the important aspects of the plan. I have edited the note to reflect any of my changes  or salient points. I have personally discussed the plan with the patient and/or family.  He has diuresed well over the past few days. Weight down 14 pounds. SCR up today. Remains weak. Denies CP or SOB.   General:  Sitting up. Weak appearing. No resp difficulty HEENT: normal Neck: supple. no JVD. Carotids 2+ bilat; no bruits. No lymphadenopathy or thryomegaly appreciated. Cor: PMI nondisplaced. Regular rate & rhythm. No rubs, gallops or murmurs. Lungs: clear Abdomen: soft, nontender, nondistended. No hepatosplenomegaly. No bruits or masses. Good bowel sounds. Extremities: no cyanosis, clubbing, rash, edema Neuro: alert & orientedx3, cranial nerves grossly intact. moves all 4 extremities w/o difficulty. Affect pleasant  He has been overdiuresed. Hold lasix, spiro and Entresto. Hopefully can restart GDMT soon. Watch dig level.   Will order cMRI.   Glori Bickers, MD  4:33 PM

## 2021-04-08 NOTE — Progress Notes (Signed)
Physical Therapy Treatment Patient Details Name: SALLY REIMERS MRN: [id number] DOB: [birth year] Today's Date: 04/08/2021   History of Present Illness Pt is 60 yo male who presented with several weeks of dyspnea and edema. Found to have hypervolemia with pulmonary edema.  Failed Bipap and required intubation. Extubated on 12/20.DM type 2, DVT on xarelto, HTN, Colon cancer s/p partial colectomy 2001 with liver mets s/p Lt hepatectomy, CKD 3a, ED.    PT Comments    Pt making good progress with mobility, demonstrating a capability to perform bed mobility, transfers, and household distance gait bouts with a RW at a min guard assist level without LOB. Pt would benefit from further training on proper usage of a RW in regards to hand placement with transfers and proximity and placement of RW when ambulating, especially when turning. Educated pt and his significant other that he would benefit from having someone nearby with all mobility for safety at d/c. Will continue to follow acutely. Due to his significant improvement and no physical assistance required or LOB noted this date, updated d/c recs to HHPT.    Recommendations for follow up therapy are one component of a multi-disciplinary discharge planning process, led by the attending physician.  Recommendations may be updated based on patient status, additional functional criteria and insurance authorization.  Follow Up Recommendations  Home health PT     Assistance Recommended at Discharge Intermittent Supervision/Assistance  Equipment Recommendations  None recommended by PT    Recommendations for Other Services       Precautions / Restrictions Precautions Precautions: Fall Restrictions Weight Bearing Restrictions: No     Mobility  Bed Mobility Overal bed mobility: Needs Assistance Bed Mobility: Supine to Sit     Supine to sit: Min guard;HOB elevated     General bed mobility comments: Pt able to transition supine > sit L EOB  with HOB elevated to ~30 degrees with min guard assist for safety.    Transfers Overall transfer level: Needs assistance Equipment used: Rolling walker (2 wheels) Transfers: Sit to/from Stand Sit to Stand: Min guard           General transfer comment: Pt able to power up to stand from EOB to RW with min guard assist at his body, but did hold RW down slightly initially. Cues provided for safe hand placement. Lateral scoot to L on EOB ~8x with supervision.    Ambulation/Gait Ambulation/Gait assistance: Min guard Gait Distance (Feet): 120 Feet Assistive device: Rolling walker (2 wheels) Gait Pattern/deviations: Step-through pattern;Decreased stride length;Trunk flexed Gait velocity: reduced Gait velocity interpretation: <1.8 ft/sec, indicate of risk for recurrent falls   General Gait Details: Pt with flexed posture, needing cues to correct and remain proximal and within RW, especially when turning, min success noted. No LOB, min guard for safety.   Stairs             Wheelchair Mobility    Modified Rankin (Stroke Patients Only)       Balance Overall balance assessment: Needs assistance Sitting-balance support: Feet supported;No upper extremity supported Sitting balance-Leahy Scale: Fair Sitting balance - Comments: Static sitting EOB with supervision for safety.   Standing balance support: Reliant on assistive device for balance Standing balance-Leahy Scale: Poor Standing balance comment: Reliant on RW for stability.                            Cognition Arousal/Alertness: Awake/alert Behavior During Therapy: Flat affect Overall Cognitive Status:  No family/caregiver present to determine baseline cognitive functioning                                 General Comments: Pt a bit tangential in speech once more awake end of session. Pt was sleeping and awakened at start of session.        Exercises      General Comments General comments  (skin integrity, edema, etc.): VSS on RA      Pertinent Vitals/Pain Pain Assessment: Faces Faces Pain Scale: Hurts a little bit Pain Location: generalized grimacing wiht mobility Pain Descriptors / Indicators: Grimacing Pain Intervention(s): Monitored during session;Limited activity within patient's tolerance;Repositioned    Home Living                          Prior Function            PT Goals (current goals can now be found in the care plan section) Acute Rehab PT Goals Patient Stated Goal: to go home, not to SNF PT Goal Formulation: With patient/family Time For Goal Achievement: 04/20/21 Potential to Achieve Goals: Good Progress towards PT goals: Progressing toward goals    Frequency    Min 3X/week      PT Plan Discharge plan needs to be updated    Co-evaluation              AM-PAC PT "6 Clicks" Mobility   Outcome Measure  Help needed turning from your back to your side while in a flat bed without using bedrails?: A Little Help needed moving from lying on your back to sitting on the side of a flat bed without using bedrails?: A Little Help needed moving to and from a bed to a chair (including a wheelchair)?: A Little Help needed standing up from a chair using your arms (e.g., wheelchair or bedside chair)?: A Little Help needed to walk in hospital room?: A Little Help needed climbing 3-5 steps with a railing? : A Lot 6 Click Score: 17    End of Session Equipment Utilized During Treatment: Gait belt Activity Tolerance: Patient tolerated treatment well Patient left: in bed;with call bell/phone within reach;with bed alarm set;with family/visitor present (sitting EOB)   PT Visit Diagnosis: Unsteadiness on feet (R26.81);Other abnormalities of gait and mobility (R26.89);Muscle weakness (generalized) (M62.81);Difficulty in walking, not elsewhere classified (R26.2)     Time: 0962-8366 PT Time Calculation (min) (ACUTE ONLY): 33 min  Charges:   $Gait Training: 8-22 mins $Therapeutic Activity: 8-22 mins                     Moishe Spice, PT, DPT Acute Rehabilitation Services  Pager: [phone] Office: [place] 04/08/2021, 5:27 PM

## 2021-04-08 NOTE — Care Management Important Message (Signed)
Important Message  Patient Details  Name: Raymond Allen MRN: [id number] Date of Birth: Mar 04, 1961   Medicare Important Message Given:  Yes     Shelda Altes 04/08/2021, 8:22 AM

## 2021-04-08 NOTE — Progress Notes (Signed)
Pt became hypotensive after getting back to bed from using the bedside commode. Vital signs rechecked. 89/66. MAP remains above 65. 2L [place] placed on pt.

## 2021-04-09 DIAGNOSIS — R0602 Shortness of breath: Secondary | ICD-10-CM | POA: Diagnosis not present

## 2021-04-09 LAB — CBC WITH DIFFERENTIAL/PLATELET
Abs Immature Granulocytes: 0.02 10*3/uL (ref 0.00–0.07)
Basophils Absolute: 0.1 10*3/uL (ref 0.0–0.1)
Basophils Relative: 1 %
Eosinophils Absolute: 0.3 10*3/uL (ref 0.0–0.5)
Eosinophils Relative: 5 %
HCT: 45.2 % (ref 39.0–52.0)
Hemoglobin: 13.6 g/dL (ref 13.0–17.0)
Immature Granulocytes: 0 %
Lymphocytes Relative: 37 %
Lymphs Abs: 2.3 10*3/uL (ref 0.7–4.0)
MCH: 24.7 pg — ABNORMAL LOW (ref 26.0–34.0)
MCHC: 30.1 g/dL (ref 30.0–36.0)
MCV: 82.2 fL (ref 80.0–100.0)
Monocytes Absolute: 0.6 10*3/uL (ref 0.1–1.0)
Monocytes Relative: 10 %
Neutro Abs: 2.8 10*3/uL (ref 1.7–7.7)
Neutrophils Relative %: 47 %
Platelets: 323 10*3/uL (ref 150–400)
RBC: 5.5 MIL/uL (ref 4.22–5.81)
RDW: 15.6 % — ABNORMAL HIGH (ref 11.5–15.5)
WBC: 6.1 10*3/uL (ref 4.0–10.5)
nRBC: 0 % (ref 0.0–0.2)

## 2021-04-09 LAB — COMPREHENSIVE METABOLIC PANEL
ALT: 25 U/L (ref 0–44)
AST: 37 U/L (ref 15–41)
Albumin: 2.6 g/dL — ABNORMAL LOW (ref 3.5–5.0)
Alkaline Phosphatase: 48 U/L (ref 38–126)
Anion gap: 10 (ref 5–15)
BUN: 22 mg/dL (ref 8–23)
CO2: 26 mmol/L (ref 22–32)
Calcium: 9.2 mg/dL (ref 8.9–10.3)
Chloride: 98 mmol/L (ref 98–111)
Creatinine, Ser: 1.31 mg/dL — ABNORMAL HIGH (ref 0.61–1.24)
GFR, Estimated: >60 mL/min (ref >60)
Glucose, Bld: 101 mg/dL — ABNORMAL HIGH (ref 70–99)
Potassium: 5.4 mmol/L — ABNORMAL HIGH (ref 3.5–5.1)
Sodium: 134 mmol/L — ABNORMAL LOW (ref 135–145)
Total Bilirubin: 2 mg/dL — ABNORMAL HIGH (ref 0.3–1.2)
Total Protein: 7.3 g/dL (ref 6.5–8.1)

## 2021-04-09 LAB — GLUCOSE, CAPILLARY
Glucose-Capillary: 112 mg/dL — ABNORMAL HIGH (ref 70–99)
Glucose-Capillary: 172 mg/dL — ABNORMAL HIGH (ref 70–99)
Glucose-Capillary: 178 mg/dL — ABNORMAL HIGH (ref 70–99)

## 2021-04-09 LAB — MAGNESIUM: Magnesium: 2.3 mg/dL (ref 1.7–2.4)

## 2021-04-09 MED ORDER — SODIUM ZIRCONIUM CYCLOSILICATE 10 G PO PACK
10.0000 g | PACK | Freq: Three times a day (TID) | ORAL | Status: AC
  Administered 2021-04-09 – 2021-04-10 (×3): 10 g via ORAL
  Filled 2021-04-09 (×3): qty 1

## 2021-04-09 MED ORDER — SODIUM POLYSTYRENE SULFONATE 15 GM/60ML PO SUSP
30.0000 g | Freq: Once | ORAL | Status: AC
  Administered 2021-04-09: 13:00:00 30 g via ORAL
  Filled 2021-04-09: qty 120

## 2021-04-09 NOTE — Progress Notes (Signed)
Pt ambulated with a walker in hallway, with stand by assistance. Tolerated well, no issues

## 2021-04-09 NOTE — Progress Notes (Signed)
PROGRESS NOTE                                                                                                                                                                                                             Patient Demographics:    Raymond Allen, is a 60 y.o. male, DOB - 1961-02-19, DJT:[id number]  Outpatient Primary MD for the patient is [place], Julie M, NP    LOS - 31  Admit date - 03/29/2021    Chief Complaint  Patient presents with   Shortness of Breath       Brief Narrative (HPI from H&P)  - 60 yo male former smoker, H/O DM type 2, DVT on xarelto, HTN, Colon cancer s/p partial colectomy 2001 with liver mets s/p Lt hepatectomy, CKD 3a, ED presented with several weeks of dyspnea and edema.  Was diagnosed with acute hypoxic respiratory failure due to CHF he was intubated kept in ICU, CHF team also was involved.  He was extubated on 04/05/2021 and transferred to hospitalist service on 04/07/2021 on day 9 of his hospital stay.   Subjective:   Patient in bed, appears comfortable, denies any headache, no fever, no chest pain or pressure, no shortness of breath , no abdominal pain. No new focal weakness, again insisting on going home.  Counseled to stay.   Assessment  & Plan :     Acute Hypoxic Resp. Failure due to Acute on chronic systolic CHF with EF 30% in the presence of mitral regurgitation and dilated cardiomyopathy. He required intubation for several days, extubated on 04/05/2021, he has been adequately diuresed with IV Lasix, also has Entresto, Aldactone, amiodarone, digoxin on board.  CHF team following.  Not on beta-blocker yet will defer that to CHF team. Respiratory failure improving currently on nasal cannula oxygen continue close monitoring with ongoing diuresis.  No sepsis or infection.  Lasix and Delene Loll will be held due to AKI which is now gradually improving.  Cardiac MRI due.  2.  AKI due to CHF.   Initial AKI was due to fluid overload now due to overdiuresis, hold Lasix and Entresto on 04/08/2021 and monitor.  Improving.  3.  Acute metabolic encephalopathy in the setting of ICU and respiratory failure.  Briefly required Precedex.  Much improved now off of sedation.  Avoid benzodiazepines and narcotics.  Continue to  monitor and advance activity.  4. Obesity.  BMI of 35.  Follow with PCP for weight loss.   5.  A flutter with [place] vas 2 score of 4.  Currently on p.o. amiodarone with Xarelto.  Cardiology on board.  6.  Moderate to severe MR.  Defer to cardiology.  Cardiac MRI ordered and due.  7. Dyslipidemia.  On statin continue.   8.  Severe weakness and deconditioning.  PT OT.  Threatening to leave AMA counseled, girlfriend updated.  9. DM type II.  [place] on Lantus, sliding scale will monitor and adjust.  Lab Results  Component Value Date   HGBA1C 7.7 (H) 03/29/2021    CBG (last 3)  Recent Labs    04/08/21 2110 04/09/21 0553 04/09/21 1205  GLUCAP 125* 112* 172*         Condition - Extremely Guarded  Family Communication  : Girlfriend Carleene Cooper [phone] on 04/07/2021, 04/09/21  Code Status :  Full  Consults  :  PCCM, Cards  PUD Prophylaxis : Pepcid added   Procedures  :      12/13 Admit, cardiology consulted, intubated. Echo 03/29/21 >> EF 25 to 30%, RVSP 38 mmHg, mod LA dilation, mod/severe MR 12/15 febrile 12/16 poor mental status, RHC/LHC with non-obstructive CAD, mean PA elevated with normal PVR, preserved CO. Heart cath with preserved CO, low SVR suggestive of distributive shock 12/17 follows commands 12/19 completed ceftriaxone. Off all pressors  12/20 Extubated Transferred to [facility]      Disposition Plan  :    Status is: Inpatient  Remains inpatient appropriate because: CHF   DVT Prophylaxis  :    SCDs Start: 03/29/21 0732 rivaroxaban (XARELTO) tablet 20 mg     Lab Results  Component Value Date   PLT 323 04/09/2021    Diet :  Diet  Order             Diet full liquid Room service appropriate? Yes; Fluid consistency: Thin  Diet effective now                    Inpatient Medications  Scheduled Meds:  amiodarone  200 mg Oral BID   chlorhexidine  15 mL Mouth Rinse BID   dapagliflozin propanediol  10 mg Oral Daily   digoxin  0.125 mg Oral Daily   docusate sodium  100 mg Oral BID   famotidine  20 mg Oral Daily   feeding supplement  237 mL Oral BID BM   gabapentin  600 mg Oral BID WC   gabapentin  900 mg Oral QHS   insulin aspart  0-20 Units Subcutaneous TID WC   insulin aspart  0-5 Units Subcutaneous QHS   insulin glargine-yfgn  15 Units Subcutaneous Daily   lactulose  20 g Oral BID   rivaroxaban  20 mg Oral Q supper   rosuvastatin  20 mg Oral Daily   sodium chloride flush  3 mL Intravenous Q12H   sodium polystyrene  30 g Oral Once   sodium zirconium cyclosilicate  10 g Oral TID   Continuous Infusions:   PRN Meds:.acetaminophen, bisacodyl, cyclobenzaprine, docusate sodium, fentaNYL (SUBLIMAZE) injection, metoCLOPramide (REGLAN) injection, ondansetron (ZOFRAN) IV, polyethylene glycol  Antibiotics  :    Anti-infectives (From admission, onward)    Start     Dose/Rate Route Frequency Ordered Stop   04/05/21 1330  cefTRIAXone (ROCEPHIN) 1 g in sodium chloride 0.9 % 100 mL IVPB        1 g 200 mL/hr over  30 Minutes Intravenous  Once 04/05/21 1240 04/05/21 1351   04/01/21 1300  cefTRIAXone (ROCEPHIN) 1 g in sodium chloride 0.9 % 100 mL IVPB        1 g 200 mL/hr over 30 Minutes Intravenous Every 24 hours 04/01/21 1202 04/05/21 1346        Time Spent in minutes  30   Lala Lund M.D on 04/09/2021 at 12:07 PM  To page go to [email or web address]   [facility] -  Office  [phone]  See all Orders from today for further details    Objective:   Vitals:   04/09/21 0425 04/09/21 0426 04/09/21 0900 04/09/21 1041  BP: 117/81  108/68 104/79  Pulse: 82 82  82  Resp: 17  17 20   Temp: (!) 97.4  F (36.3 C) (!) 97.4 F (36.3 C)  97.7 F (36.5 C)  TempSrc: Oral Oral  Oral  SpO2: 96%  100% 97%  Weight: 119.1 kg     Height:        Wt Readings from Last 3 Encounters:  04/09/21 119.1 kg  11/22/17 131.5 kg  07/28/14 104.3 kg     Intake/Output Summary (Last 24 hours) at 04/09/2021 1207 Last data filed at 04/09/2021 0939 Gross per 24 hour  Intake 120 ml  Output 850 ml  Net -730 ml     Physical Exam  Awake Alert x3, No new F.N deficits, Normal affect [place].AT,PERRAL Supple Neck, No JVD,   Symmetrical Chest wall movement, Good air movement bilaterally, CTAB RRR,No Gallops, Rubs or new Murmurs,  +ve B.Sounds, Abd Soft, No tenderness,   No Cyanosis, Clubbing or edema     Data Review:    CBC Recent Labs  Lab 04/05/21 0340 04/06/21 0355 04/07/21 0409 04/08/21 0322 04/09/21 0656  WBC 6.6 7.5 6.9 7.9 6.1  HGB 11.3* 12.1* 12.0* 12.7* 13.6  HCT 37.9* 42.3 42.8 44.3 45.2  PLT 269 287 325 332 323  MCV 83.7 84.6 84.9 83.3 82.2  MCH 24.9* 24.2* 23.8* 23.9* 24.7*  MCHC 29.8* 28.6* 28.0* 28.7* 30.1  RDW 16.2* 16.0* 15.8* 15.7* 15.6*  LYMPHSABS 1.2  --   --  2.1 2.3  MONOABS 0.6  --   --  0.7 0.6  EOSABS 0.3  --   --  0.3 0.3  BASOSABS 0.0  --   --  0.1 0.1    Electrolytes Recent Labs  Lab 04/03/21 0415 04/04/21 0256 04/05/21 0340 04/06/21 0355 04/07/21 0409 04/08/21 0322 04/09/21 0656  NA 139   < > 139 139 135 134* 134*  K 4.0   < > 4.0 4.1 3.5 4.3 5.4*  CL 103   < > 106 103 101 99 98  CO2 28   < > 27 29 28 25 26   GLUCOSE 218*   < > 228* 243* 158* 186* 101*  BUN 30*   < > 38* 26* 16 22 22   CREATININE 1.18   < > 1.18 1.07 0.95 1.54* 1.31*  CALCIUM 8.8*   < > 9.0 9.3 8.7* 9.5 9.2  AST  --   --   --   --   --  29 37  ALT  --   --   --   --   --  28 25  ALKPHOS  --   --   --   --   --  52 48  BILITOT  --   --   --   --   --  1.1 2.0*  ALBUMIN  --   --   --   --   --  2.7* 2.6*  MG 2.1  --  2.3  --   --  2.0 2.3   < > = values in this interval not  displayed.    ------------------------------------------------------------------------------------------------------------------ No results for input(s): CHOL, HDL, LDLCALC, TRIG, CHOLHDL, LDLDIRECT in the last 72 hours.   Lab Results  Component Value Date   HGBA1C 7.7 (H) 03/29/2021     Radiology Reports  DG Abd 1 View  Result Date: 04/05/2021 CLINICAL DATA:  Check gastric catheter placement EXAM: ABDOMEN - 1 VIEW COMPARISON:  Film from earlier in the same day. FINDINGS: Gastric catheter is again identified within the stomach. No obstructive changes are seen. Postsurgical changes on the right are noted. No free air is seen. IMPRESSION: Gastric catheter in the stomach. Electronically Signed   By: Inez Catalina M.D.   On: 04/05/2021 23:25   DG Chest Port 1 View  Result Date: 04/06/2021 CLINICAL DATA:  Pulmonary edema J81.1 (ICD-10-CM) EXAM: PORTABLE CHEST 1 VIEW COMPARISON:  04/01/2021. FINDINGS: Gastric tube courses below the diaphragm with the tip in the expected region the stomach. Side port is below the GE junction. Similar positioning of a left IJ central venous catheter with the tip projecting at the expected junction of the brachiocephalic vein and SVC. Endotracheal tube is no longer visualized, presumably related to extubation. Similar enlargement of the cardiac silhouette. Similar pulmonary vascular congestion. Similar patchy/heterogeneous opacities bilaterally. No definite pleural effusions or pneumothorax on this single AP semi erect radiograph. IMPRESSION: 1. Cardiomegaly and pulmonary vascular congestion. 2. Similar heterogeneous opacities bilaterally, possibly due to pulmonary edema or atelectasis. Electronically Signed   By: Margaretha Sheffield M.D.   On: 04/06/2021 08:18   DG Abd Portable 1V  Result Date: 04/05/2021 CLINICAL DATA:  NG tube placement. EXAM: PORTABLE ABDOMEN - 1 VIEW COMPARISON:  Abdominal x-ray 03/30/2021. FINDINGS: Nasogastric tube tip is in the proximal  stomach. The stomach is dilated. There are surgical clips in the right abdomen. IMPRESSION: 1. Nasogastric tube tip in the proximal stomach. The stomach is dilated. Recommend advancing tube. Electronically Signed   By: Ronney Asters M.D.   On: 04/05/2021 22:34

## 2021-04-09 NOTE — Progress Notes (Deleted)
Ready for D/c awaiting on transport.

## 2021-04-09 NOTE — Progress Notes (Signed)
Pt is saying that he is going to leave AMA. Offer him to walk  with him but he is so focused on leaving. Tried to explain to him  why he needs to stay in the hospital, but he is said he will let me know what he decides.

## 2021-04-09 NOTE — Progress Notes (Signed)
Patient ID: Raymond Allen, male   DOB: 25-Dec-1960, 60 y.o.   MRN: [id number]     Advanced Heart Failure Rounding Note  PCP-Cardiologist: Dr. Smith/Dr. Haroldine Laws    Subjective:   Admit weight 288--> today 263.9 pounds.   60/ y/o male with DM2, HTN, former smoker, remote h/o colon cancer -> liver CA (2003) admitted with acute respiratory failure and pulmonary edema -> intubated.  Completed ceftriaxone for Strep aglactiae PNA. PCT 118 => 0.22 (?)   Found to have new onset CM EF 25-30%. Subsequently developed AF with RVR. Diuresed 25 pounds. Back in NSR on amio.   Cath 12/16 minimal CAD  Lasix stopped yesterday due to AKI. Scr 0.95 -> 1.5 -> 1.3  Denies SOB, orthopnea or PND    Objective:   Weight Range: 119.1 kg Body mass index is 34.64 kg/m.   Vital Signs:   Temp:  [97 F (36.1 C)-98.2 F (36.8 C)] 97.7 F (36.5 C) (12/24 1041) Pulse Rate:  [80-82] 82 (12/24 1041) Resp:  [17-20] 20 (12/24 1041) BP: (104-117)/(67-81) 104/79 (12/24 1041) SpO2:  [91 %-100 %] 97 % (12/24 1041) Weight:  [119.1 kg] 119.1 kg (12/24 0425) Last BM Date: 04/07/21  Weight change: Filed Weights   04/08/21 0000 04/08/21 0220 04/09/21 0425  Weight: 120.8 kg 119.7 kg 119.1 kg    Intake/Output:   Intake/Output Summary (Last 24 hours) at 04/09/2021 1434 Last data filed at 04/09/2021 0939 Gross per 24 hour  Intake 120 ml  Output 850 ml  Net -730 ml       Physical Exam   General:  Sitting up No resp difficulty HEENT: normal Neck: supple. no JVD. Carotids 2+ bilat; no bruits. No lymphadenopathy or thryomegaly appreciated. Cor: PMI nondisplaced. Regular rate & rhythm. No rubs, gallops or murmurs. Lungs: clear Abdomen: obese soft, nontender, nondistended. No hepatosplenomegaly. No bruits or masses. Good bowel sounds. Extremities: no cyanosis, clubbing, rash, edema Neuro: alert & orientedx3, cranial nerves grossly intact. moves all 4 extremities w/o difficulty. Affect  pleasant     Telemetry   SR 80-90s Personally reviewed  Labs    CBC Recent Labs    04/08/21 0322 04/09/21 0656  WBC 7.9 6.1  NEUTROABS 4.6 2.8  HGB 12.7* 13.6  HCT 44.3 45.2  MCV 83.3 82.2  PLT 332 767    Basic Metabolic Panel Recent Labs    04/08/21 0322 04/09/21 0656  NA 134* 134*  K 4.3 5.4*  CL 99 98  CO2 25 26  GLUCOSE 186* 101*  BUN 22 22  CREATININE 1.54* 1.31*  CALCIUM 9.5 9.2  MG 2.0 2.3    Liver Function Tests Recent Labs    04/08/21 0322 04/09/21 0656  AST 29 37  ALT 28 25  ALKPHOS 52 48  BILITOT 1.1 2.0*  PROT 7.8 7.3  ALBUMIN 2.7* 2.6*    No results for input(s): LIPASE, AMYLASE in the last 72 hours. Cardiac Enzymes No results for input(s): CKTOTAL, CKMB, CKMBINDEX, TROPONINI in the last 72 hours.  BNP: BNP (last 3 results) Recent Labs    03/29/21 0502  BNP 391.6*     ProBNP (last 3 results) No results for input(s): PROBNP in the last 8760 hours.   D-Dimer No results for input(s): DDIMER in the last 72 hours. Hemoglobin A1C No results for input(s): HGBA1C in the last 72 hours.  Fasting Lipid Panel No results for input(s): CHOL, HDL, LDLCALC, TRIG, CHOLHDL, LDLDIRECT in the last 72 hours.  Thyroid Function Tests No results  for input(s): TSH, T4TOTAL, T3FREE, THYROIDAB in the last 72 hours.  Invalid input(s): FREET3   Other results:   Imaging    No results found.   Medications:     Scheduled Medications:  amiodarone  200 mg Oral BID   chlorhexidine  15 mL Mouth Rinse BID   dapagliflozin propanediol  10 mg Oral Daily   digoxin  0.125 mg Oral Daily   docusate sodium  100 mg Oral BID   famotidine  20 mg Oral Daily   feeding supplement  237 mL Oral BID BM   gabapentin  600 mg Oral BID WC   gabapentin  900 mg Oral QHS   insulin aspart  0-20 Units Subcutaneous TID WC   insulin aspart  0-5 Units Subcutaneous QHS   insulin glargine-yfgn  15 Units Subcutaneous Daily   lactulose  20 g Oral BID   rivaroxaban   20 mg Oral Q supper   rosuvastatin  20 mg Oral Daily   sodium chloride flush  3 mL Intravenous Q12H   sodium zirconium cyclosilicate  10 g Oral TID    Infusions:    PRN Medications: acetaminophen, bisacodyl, cyclobenzaprine, docusate sodium, fentaNYL (SUBLIMAZE) injection, metoCLOPramide (REGLAN) injection, ondansetron (ZOFRAN) IV, polyethylene glycol   Assessment/Plan   1. Acute Systolic Heart Failure (New)  - Admitted w/ acute pulmonary edema/ hypoxic respiratory failure  - Echo LVEF 25-30%, diffuse HK, RV mildly reduced. No prior study for comparison - Etiology uncertain. H/o chemotherapy w/ Fluoropyrimidines (potentially cardiotoxic) but this was remotely. Off chemo since 2003 - Cath 12/16 minimal CAD. Preserved CO. Low SVR - Creatinine improved will restart spiro 12.5 - Continue farxiga, $0.00 CO Pay - Will restart Entresto tomorrow if BP tolerates - Continue digoxin 0.125  - No b-blocker yet - cMRI pending. Can do as outpatient if he is otherwise ready for d/c soon   2. Septic Shock  - Afebrile  - PCT 0.32 -> 118 -> 0.22 (?)  - Resolved, stable BP.  - Completed ceftriaxone for Strep agalactiae.  - No change.    3. Acute Hypoxic Respiratory Failure - 2/2 acute pulmonary edema and PNA - intubated, vent management per PCCM  - extubated 12/20 - O2 stable on 2L [place]   4. Mitral Regurgitation  - Mod-severe on echo. No significant v-waves in PCWP tracing on cath - Suspect functional in setting of severely dilated LV  - Doubt this is major driver currently.  - cMRI pending  5. AKI on CKD Stage II due to shock - Baseline SCr ~1.2  - AKI improved with holding diuretics.  - Plan as above  6. Hypokalemia -  K 4.3  - Supp  - Aim to keep > 4.0   7. Type 2DM  - Hgb A1c 7.7  - Consider SGLT2i initiation prior to d/c   8. Atrial Tach vs AFlutter w/ RVR - In SR  -Continue amio 200 mg BID - On Xarelto 20  9. AKI - due to overdiuresis - hold diuretics and  Entresto/spiro  TOC following for [facility]   Length of Stay: 11  Glori Bickers, MD  04/09/2021, 2:34 PM  Advanced Heart Failure Team Pager [phone] (M-F; 7a - 5p)  Please contact [facility] for night-coverage after hours (5p -7a ) and weekends on [email or web address]

## 2021-04-10 DIAGNOSIS — R0602 Shortness of breath: Secondary | ICD-10-CM | POA: Diagnosis not present

## 2021-04-10 LAB — CBC WITH DIFFERENTIAL/PLATELET
Abs Immature Granulocytes: 0.04 10*3/uL (ref 0.00–0.07)
Basophils Absolute: 0.1 10*3/uL (ref 0.0–0.1)
Basophils Relative: 1 %
Eosinophils Absolute: 0.3 10*3/uL (ref 0.0–0.5)
Eosinophils Relative: 4 %
HCT: 42.7 % (ref 39.0–52.0)
Hemoglobin: 12.7 g/dL — ABNORMAL LOW (ref 13.0–17.0)
Immature Granulocytes: 1 %
Lymphocytes Relative: 35 %
Lymphs Abs: 2.5 10*3/uL (ref 0.7–4.0)
MCH: 24.4 pg — ABNORMAL LOW (ref 26.0–34.0)
MCHC: 29.7 g/dL — ABNORMAL LOW (ref 30.0–36.0)
MCV: 82 fL (ref 80.0–100.0)
Monocytes Absolute: 0.7 10*3/uL (ref 0.1–1.0)
Monocytes Relative: 10 %
Neutro Abs: 3.5 10*3/uL (ref 1.7–7.7)
Neutrophils Relative %: 49 %
Platelets: 330 10*3/uL (ref 150–400)
RBC: 5.21 MIL/uL (ref 4.22–5.81)
RDW: 15.4 % (ref 11.5–15.5)
WBC: 7.1 10*3/uL (ref 4.0–10.5)
nRBC: 0 % (ref 0.0–0.2)

## 2021-04-10 LAB — COMPREHENSIVE METABOLIC PANEL
ALT: 25 U/L (ref 0–44)
AST: 19 U/L (ref 15–41)
Albumin: 2.6 g/dL — ABNORMAL LOW (ref 3.5–5.0)
Alkaline Phosphatase: 46 U/L (ref 38–126)
Anion gap: 8 (ref 5–15)
BUN: 20 mg/dL (ref 8–23)
CO2: 29 mmol/L (ref 22–32)
Calcium: 9.1 mg/dL (ref 8.9–10.3)
Chloride: 99 mmol/L (ref 98–111)
Creatinine, Ser: 1.48 mg/dL — ABNORMAL HIGH (ref 0.61–1.24)
GFR, Estimated: 53 mL/min — ABNORMAL LOW (ref >60)
Glucose, Bld: 107 mg/dL — ABNORMAL HIGH (ref 70–99)
Potassium: 4.4 mmol/L (ref 3.5–5.1)
Sodium: 136 mmol/L (ref 135–145)
Total Bilirubin: 0.6 mg/dL (ref 0.3–1.2)
Total Protein: 7.4 g/dL (ref 6.5–8.1)

## 2021-04-10 LAB — GLUCOSE, CAPILLARY
Glucose-Capillary: 192 mg/dL — ABNORMAL HIGH (ref 70–99)
Glucose-Capillary: 169 mg/dL — ABNORMAL HIGH (ref 70–99)
Glucose-Capillary: 150 mg/dL — ABNORMAL HIGH (ref 70–99)
Glucose-Capillary: 190 mg/dL — ABNORMAL HIGH (ref 70–99)
Glucose-Capillary: 163 mg/dL — ABNORMAL HIGH (ref 70–99)

## 2021-04-10 LAB — DIGOXIN LEVEL: Digoxin Level: 0.4 ng/mL — ABNORMAL LOW (ref 0.8–2.0)

## 2021-04-10 LAB — MAGNESIUM: Magnesium: 2.2 mg/dL (ref 1.7–2.4)

## 2021-04-10 MED ORDER — SODIUM CHLORIDE 0.9 % IV BOLUS
500.0000 mL | Freq: Once | INTRAVENOUS | Status: AC
  Administered 2021-04-10: 13:00:00 500 mL via INTRAVENOUS

## 2021-04-10 MED ORDER — LACTATED RINGERS IV SOLN: INTRAVENOUS | Status: AC

## 2021-04-10 NOTE — Progress Notes (Signed)
PROGRESS NOTE                                                                                                                                                                                                             Patient Demographics:    Raymond Allen, is a 60 y.o. male, DOB - 18-Jun-1960, VVO:[id number]  Outpatient Primary MD for the patient is [place], Julie M, NP    LOS - 12  Admit date - 03/29/2021    Chief Complaint  Patient presents with   Shortness of Breath       Brief Narrative (HPI from H&P)  - 60 yo male former smoker, H/O DM type 2, DVT on xarelto, HTN, Colon cancer s/p partial colectomy 2001 with liver mets s/p Lt hepatectomy, CKD 3a, ED presented with several weeks of dyspnea and edema.  Was diagnosed with acute hypoxic respiratory failure due to CHF he was intubated kept in ICU, CHF team also was involved.  He was extubated on 04/05/2021 and transferred to hospitalist service on 04/07/2021 on day 9 of his hospital stay.   Subjective:   Patient in bed, appears comfortable, denies any headache, no fever, no chest pain or pressure, no shortness of breath , no abdominal pain. No new focal weakness.   Assessment  & Plan :     Acute Hypoxic Resp. Failure due to Acute on chronic systolic CHF with EF 26% in the presence of mitral regurgitation and dilated cardiomyopathy. He required intubation for several days, extubated on 04/05/2021, he has been adequately diuresed with IV Lasix, also has Entresto, Aldactone, amiodarone, digoxin on board.  CHF team following.  Not on beta-blocker yet will defer that to CHF team. Respiratory failure improving currently on nasal cannula oxygen continue close monitoring with ongoing diuresis (per Cards).  No sepsis or infection.  Lasix and Entresto held due to AKI which is now gradually improving - per Cards.  Cardiac MRI due.  2.  AKI due to CHF.  Initially AKI was due to fluid  overload now due to overdiuresis, Improving, gentle IVF 04/10/21.  3.  Acute metabolic encephalopathy in the setting of ICU and respiratory failure.  Briefly required Precedex.  Much improved now off of sedation.  Avoid benzodiazepines and narcotics.  Continue to monitor and advance activity.  4. Obesity.  BMI of 35.  Follow with PCP for weight loss.   5.  A flutter with [place] vas 2 score of 4.  Currently on p.o. amiodarone with Xarelto.  Cardiology on board.  6.  Moderate to severe MR.  Defer to cardiology.  Cardiac MRI ordered and due.  7. Dyslipidemia.  On statin continue.   8. Severe weakness and deconditioning.  PT OT.  Threatening to leave AMA counseled, girlfriend updated, and appears extremely weak and deconditioned, if continues to improve and increase his activity will try and discharge him in the next 1 to 2 days.  9. DM type II.  [place] on Lantus, sliding scale will monitor and adjust.  Lab Results  Component Value Date   HGBA1C 7.7 (H) 03/29/2021    CBG (last 3)  Recent Labs    04/09/21 1205 04/09/21 1651 04/10/21 0549  GLUCAP 172* 178* 169*         Condition - Extremely Guarded  Family Communication  : Girlfriend Carleene Cooper [phone] on 04/07/2021, 04/09/21  Code Status :  Full  Consults  :  PCCM, Cards  PUD Prophylaxis : Pepcid added   Procedures  :      12/13 Admit, cardiology consulted, intubated. Echo 03/29/21 >> EF 25 to 30%, RVSP 38 mmHg, mod LA dilation, mod/severe MR 12/15 febrile 12/16 poor mental status, RHC/LHC with non-obstructive CAD, mean PA elevated with normal PVR, preserved CO. Heart cath with preserved CO, low SVR suggestive of distributive shock 12/17 follows commands 12/19 completed ceftriaxone. Off all pressors  12/20 Extubated Transferred to [facility]      Disposition Plan  :    Status is: Inpatient  Remains inpatient appropriate because: CHF   DVT Prophylaxis  :    SCDs Start: 03/29/21 0732 rivaroxaban (XARELTO) tablet 20  mg     Lab Results  Component Value Date   PLT 330 04/10/2021    Diet :  Diet Order             DIET SOFT Room service appropriate? Yes; Fluid consistency: Thin  Diet effective now                    Inpatient Medications  Scheduled Meds:  amiodarone  200 mg Oral BID   chlorhexidine  15 mL Mouth Rinse BID   dapagliflozin propanediol  10 mg Oral Daily   digoxin  0.125 mg Oral Daily   docusate sodium  100 mg Oral BID   famotidine  20 mg Oral Daily   feeding supplement  237 mL Oral BID BM   gabapentin  600 mg Oral BID WC   gabapentin  900 mg Oral QHS   insulin aspart  0-20 Units Subcutaneous TID WC   insulin aspart  0-5 Units Subcutaneous QHS   insulin glargine-yfgn  15 Units Subcutaneous Daily   lactulose  20 g Oral BID   rivaroxaban  20 mg Oral Q supper   rosuvastatin  20 mg Oral Daily   sodium chloride flush  3 mL Intravenous Q12H   Continuous Infusions:   PRN Meds:.acetaminophen, bisacodyl, cyclobenzaprine, docusate sodium, fentaNYL (SUBLIMAZE) injection, metoCLOPramide (REGLAN) injection, ondansetron (ZOFRAN) IV, polyethylene glycol  Antibiotics  :    Anti-infectives (From admission, onward)    Start     Dose/Rate Route Frequency Ordered Stop   04/05/21 1330  cefTRIAXone (ROCEPHIN) 1 g in sodium chloride 0.9 % 100 mL IVPB        1 g 200 mL/hr over 30 Minutes Intravenous  Once 04/05/21 1240  04/05/21 1351   04/01/21 1300  cefTRIAXone (ROCEPHIN) 1 g in sodium chloride 0.9 % 100 mL IVPB        1 g 200 mL/hr over 30 Minutes Intravenous Every 24 hours 04/01/21 1202 04/05/21 1346        Time Spent in minutes  30   Lala Lund M.D on 04/10/2021 at 10:42 AM  To page go to [email or web address]   [facility] -  Office  [phone]  See all Orders from today for further details    Objective:   Vitals:   04/09/21 1924 04/09/21 2000 04/10/21 0532 04/10/21 0841  BP: 113/75  (!) 123/97 120/78  Pulse: 81 81 (!) 106 80  Resp: 16 (!) 21 16 18   Temp:  97.7 F (36.5 C)  97.8 F (36.6 C)   TempSrc: Oral  Oral   SpO2: 100% 97% 100% 100%  Weight:   119.3 kg   Height:        Wt Readings from Last 3 Encounters:  04/10/21 119.3 kg  11/22/17 131.5 kg  07/28/14 104.3 kg     Intake/Output Summary (Last 24 hours) at 04/10/2021 1042 Last data filed at 04/10/2021 0842 Gross per 24 hour  Intake 360 ml  Output 900 ml  Net -540 ml     Physical Exam  Awake Alert, No new F.N deficits, Normal affect [place].AT,PERRAL Supple Neck, No JVD,   Symmetrical Chest wall movement, Good air movement bilaterally, CTAB RRR,No Gallops, Rubs or new Murmurs,  +ve B.Sounds, Abd Soft, No tenderness,   No Cyanosis, Clubbing or edema    Data Review:    CBC Recent Labs  Lab 04/05/21 0340 04/06/21 0355 04/07/21 0409 04/08/21 0322 04/09/21 0656 04/10/21 0308  WBC 6.6 7.5 6.9 7.9 6.1 7.1  HGB 11.3* 12.1* 12.0* 12.7* 13.6 12.7*  HCT 37.9* 42.3 42.8 44.3 45.2 42.7  PLT 269 287 325 332 323 330  MCV 83.7 84.6 84.9 83.3 82.2 82.0  MCH 24.9* 24.2* 23.8* 23.9* 24.7* 24.4*  MCHC 29.8* 28.6* 28.0* 28.7* 30.1 29.7*  RDW 16.2* 16.0* 15.8* 15.7* 15.6* 15.4  LYMPHSABS 1.2  --   --  2.1 2.3 2.5  MONOABS 0.6  --   --  0.7 0.6 0.7  EOSABS 0.3  --   --  0.3 0.3 0.3  BASOSABS 0.0  --   --  0.1 0.1 0.1    Electrolytes Recent Labs  Lab 04/05/21 0340 04/06/21 0355 04/07/21 0409 04/08/21 0322 04/09/21 0656 04/10/21 0308  NA 139 139 135 134* 134* 136  K 4.0 4.1 3.5 4.3 5.4* 4.4  CL 106 103 101 99 98 99  CO2 27 29 28 25 26 29   GLUCOSE 228* 243* 158* 186* 101* 107*  BUN 38* 26* 16 22 22 20   CREATININE 1.18 1.07 0.95 1.54* 1.31* 1.48*  CALCIUM 9.0 9.3 8.7* 9.5 9.2 9.1  AST  --   --   --  29 37 19  ALT  --   --   --  28 25 25   ALKPHOS  --   --   --  52 48 46  BILITOT  --   --   --  1.1 2.0* 0.6  ALBUMIN  --   --   --  2.7* 2.6* 2.6*  MG 2.3  --   --  2.0 2.3 2.2     ------------------------------------------------------------------------------------------------------------------ No results for input(s): CHOL, HDL, LDLCALC, TRIG, CHOLHDL, LDLDIRECT in the last 72 hours.   Lab Results  Component Value  Date   HGBA1C 7.7 (H) 03/29/2021     Radiology Reports  No results found.

## 2021-04-10 NOTE — Progress Notes (Signed)
Patient ID: Raymond Allen, male   DOB: [birth year], 60 y.o.   MRN: [id number]     Advanced Heart Failure Rounding Note  PCP-Cardiologist: Dr. Smith/Dr. Haroldine Laws    Subjective:    60/ y/o male with DM2, HTN, former smoker, remote h/o colon cancer -> liver CA (2003) admitted with acute respiratory failure and pulmonary edema -> intubated.  Completed ceftriaxone for Strep aglactiae PNA. PCT 118 => 0.22 (?)   Found to have new onset CM EF 25-30%. Subsequently developed AF with RVR. Diuresed 25 pounds. Back in NSR on amio.   Cath 12/16 minimal CAD  Lasix stopped 12/23   Feels ok. Denies CP, SOB, orthopnea or PND. SCr back up. BP ok     Objective:   Weight Range: 119.3 kg Body mass index is 34.7 kg/m.   Vital Signs:   Temp:  [97.5 F (36.4 C)-97.8 F (36.6 C)] 97.8 F (36.6 C) (12/25 0532) Pulse Rate:  [80-106] 80 (12/25 0841) Resp:  [16-21] 18 (12/25 0841) BP: (113-123)/(72-97) 120/78 (12/25 0841) SpO2:  [94 %-100 %] 100 % (12/25 0841) Weight:  [119.3 kg] 119.3 kg (12/25 0532) Last BM Date: 04/10/21  Weight change: Filed Weights   04/08/21 0220 04/09/21 0425 04/10/21 0532  Weight: 119.7 kg 119.1 kg 119.3 kg    Intake/Output:   Intake/Output Summary (Last 24 hours) at 04/10/2021 1231 Last data filed at 04/10/2021 0842 Gross per 24 hour  Intake 360 ml  Output 900 ml  Net -540 ml       Physical Exam   General:  Sitting up in bed. No resp difficulty HEENT: normal Neck: supple. no JVD. Carotids 2+ bilat; no bruits. No lymphadenopathy or thryomegaly appreciated. Cor: PMI nondisplaced. Regular rate & rhythm. No rubs, gallops or murmurs. Lungs: clear Abdomen: obese soft, nontender, nondistended. No hepatosplenomegaly. No bruits or masses. Good bowel sounds. Extremities: no cyanosis, clubbing, rash, edema Neuro: alert & orientedx3, cranial nerves grossly intact. moves all 4 extremities w/o difficulty. Affect pleasant   Telemetry   SR 80-90s Personally  reviewed  Labs    CBC Recent Labs    04/09/21 0656 04/10/21 0308  WBC 6.1 7.1  NEUTROABS 2.8 3.5  HGB 13.6 12.7*  HCT 45.2 42.7  MCV 82.2 82.0  PLT 323 024    Basic Metabolic Panel Recent Labs    04/09/21 0656 04/10/21 0308  NA 134* 136  K 5.4* 4.4  CL 98 99  CO2 26 29  GLUCOSE 101* 107*  BUN 22 20  CREATININE 1.31* 1.48*  CALCIUM 9.2 9.1  MG 2.3 2.2    Liver Function Tests Recent Labs    04/09/21 0656 04/10/21 0308  AST 37 19  ALT 25 25  ALKPHOS 48 46  BILITOT 2.0* 0.6  PROT 7.3 7.4  ALBUMIN 2.6* 2.6*    No results for input(s): LIPASE, AMYLASE in the last 72 hours. Cardiac Enzymes No results for input(s): CKTOTAL, CKMB, CKMBINDEX, TROPONINI in the last 72 hours.  BNP: BNP (last 3 results) Recent Labs    03/29/21 0502  BNP 391.6*     ProBNP (last 3 results) No results for input(s): PROBNP in the last 8760 hours.   D-Dimer No results for input(s): DDIMER in the last 72 hours. Hemoglobin A1C No results for input(s): HGBA1C in the last 72 hours.  Fasting Lipid Panel No results for input(s): CHOL, HDL, LDLCALC, TRIG, CHOLHDL, LDLDIRECT in the last 72 hours.  Thyroid Function Tests No results for input(s): TSH, T4TOTAL, T3FREE, THYROIDAB  in the last 72 hours.  Invalid input(s): FREET3   Other results:   Imaging    No results found.   Medications:     Scheduled Medications:  amiodarone  200 mg Oral BID   chlorhexidine  15 mL Mouth Rinse BID   dapagliflozin propanediol  10 mg Oral Daily   digoxin  0.125 mg Oral Daily   docusate sodium  100 mg Oral BID   famotidine  20 mg Oral Daily   feeding supplement  237 mL Oral BID BM   gabapentin  600 mg Oral BID WC   gabapentin  900 mg Oral QHS   insulin aspart  0-20 Units Subcutaneous TID WC   insulin aspart  0-5 Units Subcutaneous QHS   insulin glargine-yfgn  15 Units Subcutaneous Daily   lactulose  20 g Oral BID   rivaroxaban  20 mg Oral Q supper   rosuvastatin  20 mg Oral  Daily   sodium chloride flush  3 mL Intravenous Q12H    Infusions:  lactated ringers       PRN Medications: acetaminophen, bisacodyl, cyclobenzaprine, docusate sodium, fentaNYL (SUBLIMAZE) injection, metoCLOPramide (REGLAN) injection, ondansetron (ZOFRAN) IV, polyethylene glycol   Assessment/Plan   1. Acute Systolic Heart Failure (New)  - Admitted w/ acute pulmonary edema/ hypoxic respiratory failure  - Echo LVEF 25-30%, diffuse HK, RV mildly reduced. No prior study for comparison - Etiology uncertain. H/o chemotherapy w/ Fluoropyrimidines (potentially cardiotoxic) but this was remotely. Off chemo since 2003 - Cath 12/16 minimal CAD. Preserved CO. Low SVR - Arlyce Harman and Delene Loll held with AKI - Continue farxiga, $0.00 CO Pay - Continue digoxin 0.125  - No b-blocker yet - D/w Dr. Candiss Norse. Scr remains elevated. He appears dry. Has order for 500cc NS. I will give an additional 500 cc bolus - cMRI pending. Can do as outpatient if he is otherwise ready for d/c soon   If SCr < 1.4 and SBP >= 110 tomorrow can d/c home on  Farxiga 10 Entresto 24/26 bid Spiro 12.5 Dig 0.125 Amio 200 daily Xarelto   Will arrange f/u in HF Clinic and outpatient cMRI.    2. Septic Shock  - Afebrile  - PCT 0.32 -> 118 -> 0.22 (?)  - Resolved, stable BP.  - Completed ceftriaxone for Strep agalactiae.  - No change.    3. Acute Hypoxic Respiratory Failure - 2/2 acute pulmonary edema and PNA - intubated, vent management per PCCM  - extubated 12/20 - O2 stable on 2L [place]   4. Mitral Regurgitation  - Mod-severe on echo. No significant v-waves in PCWP tracing on cath - Suspect functional in setting of severely dilated LV  - Doubt this is major driver currently.  - cMRI pending  5. AKI on CKD Stage II due to shock - Baseline SCr ~1.2  - AKI improved with holding diuretics.  - Plan as above   6. Hypokalemia -  K 4.4   7. Type 2DM  - Hgb A1c 7.7  - Continue Farxiga  8. Atrial Tach vs AFlutter  w/ RVR - In SR  -Continue amio 200 mg BID - On Xarelto 20  9. AKI - due to overdiuresis - hold diuretics and Entresto/spiro as above - Give IVF  TOC following for [facility]   Length of Stay: 12  Glori Bickers, MD  04/10/2021, 12:31 PM  Advanced Heart Failure Team Pager [phone] (M-F; 7a - 5p)  Please contact [facility] for night-coverage after hours (5p -7a ) and weekends  on [email or web address]

## 2021-04-10 NOTE — Evaluation (Signed)
Clinical/Bedside Swallow Evaluation Patient Details  Name: Raymond Allen MRN: [id number] Date of Birth: Aug 16, 1960  Today's Date: 04/10/2021 Time: SLP Start Time (ACUTE ONLY): 5427 SLP Stop Time (ACUTE ONLY): 0623 SLP Time Calculation (min) (ACUTE ONLY): 15 min  Past Medical History:  Past Medical History:  Diagnosis Date   Cancer ([place])    Colon cancer metastasized to liver ([place]) 01/05/2012   Colon carcinoma ([place])    colon ca dx 2001;   Diabetes mellitus without complication ([place])    Hypertension    Muscle weakness-general 01/05/2012   Past Surgical History:  Past Surgical History:  Procedure Laterality Date   CHOLECYSTECTOMY     COLON SURGERY     sigmoid colectomy, appendectomy   INCISIONAL HERNIA REPAIR N/A 11/27/2017   Procedure: LAPAROSCOPIC ASSISTED INCISIONAL HERNIA;  Surgeon: Clovis Riley, MD;  Location: [facility];  Service: General;  Laterality: N/A;   INSERTION OF MESH N/A 11/27/2017   Procedure: INSERTION OF MESH;  Surgeon: Clovis Riley, MD;  Location: [facility];  Service: General;  Laterality: N/A;   LAPAROSCOPIC LYSIS OF ADHESIONS N/A 11/27/2017   Procedure: LAPAROSCOPIC LYSIS OF ADHESIONS;  Surgeon: Clovis Riley, MD;  Location: [facility];  Service: General;  Laterality: N/A;   LIVER SURGERY     partial removal [facility] in 2003   PORT-A-CATH REMOVAL Right 07/16/2012   Procedure: REMOVAL PORT-A-CATH;  Surgeon: Haywood Lasso, MD;  Location: [facility];  Service: General;  Laterality: Right;   RIGHT/LEFT HEART CATH AND CORONARY ANGIOGRAPHY N/A 04/01/2021   Procedure: RIGHT/LEFT HEART CATH AND CORONARY ANGIOGRAPHY;  Surgeon: Jolaine Artist, MD;  Location: [facility];  Service: Cardiovascular;  Laterality: N/A;   HPI:  60 yo male adm to [facility] with acute respiratory failure- has remote h/o colon cancer - liver CA 2003, prior smoker, due to to respiratory failure and edema -> intubated. 12/13 - 12/20.  Cortrak tube feeding removed on 12/21.  Pt  has been on a soft diet.  Per cardiology notes, he is in NSR.  Swallow eval ordered. Pt denies h/o dysphagia/heartburn - has difficulty remaining on topic and is verbose - needing cues to return to SLP questions.  CXR 12/20 showed Cardiomegaly and pulmonary vascular congestion.  SLP did not locate prior swallow evaluations in his chart.    Assessment / Plan / Recommendation  Clinical Impression  Patient presents with functional oropharyngeal swallow based on clinical swallow evaluation. He easily passed 3 ounce Yale water challenge - conducted x2 - (2nd time without cue) and consumed small single bites of graham cracker and applesauce. Pt swallow judged to be clinically timely without evidence of retention.  He did only consume a few bites - as he is verbose and requires cues to attend to task at hand.   Despite explaining clinical reasoning for eval order, pt stated he did not understand the purpose.  He did participate and no evidence of dysphagia present.  Advised he stop po and rest if short of breath. No SLP follow up needed= spoke to RN. Thanks. SLP Visit Diagnosis: Dysphagia, unspecified (R13.10)    Aspiration Risk  No limitations    Diet Recommendation Regular;Thin liquid   Liquid Administration via: Cup;Straw Medication Administration: Whole meds with liquid Supervision: Patient able to self feed Compensations: Slow rate;Small sips/bites Postural Changes: Seated upright at 90 degrees    Other  Recommendations Oral Care Recommendations: Oral care BID    Recommendations for follow up therapy are one  component of a multi-disciplinary discharge planning process, led by the attending physician.  Recommendations may be updated based on patient status, additional functional criteria and insurance authorization.  Follow up Recommendations No SLP follow up      Assistance Recommended at Discharge None  Functional Status Assessment Patient has not had a recent decline in their functional  status  Frequency and Duration     N/a       Prognosis   N/a     Swallow Study   General Date of Onset: 04/10/21 HPI: 60 yo male adm to [facility] with acute respiratory failure- has remote h/o colon cancer - liver CA 2003, prior smoker, due to to respiratory failure and edema -> intubated. 12/13 - 12/20.  Cortrak tube feeding removed on 12/21.  Pt has been on a soft diet.  Per cardiology notes, he is in NSR.  Swallow eval ordered. Pt denies h/o dysphagia/heartburn - has difficulty remaining on topic and is verbose - needing cues to return to SLP questions.  CXR 12/20 showed Cardiomegaly and pulmonary vascular congestion.  SLP did not locate prior swallow evaluations in his chart. Diet Prior to this Study: Dysphagia 3 (soft);Thin liquids Temperature Spikes Noted: No Respiratory Status: Room air History of Recent Intubation: Yes Length of Intubations (days): 7 days Date extubated: 04/05/21 Behavior/Cognition: Alert;Cooperative;Distractible;Requires cueing Oral Cavity Assessment: Within Functional Limits Oral Care Completed by SLP: No (pt completed prior to session) Oral Cavity - Dentition: Adequate natural dentition Vision: Functional for self-feeding Self-Feeding Abilities: Able to feed self Patient Positioning: Upright in bed Baseline Vocal Quality: Normal Volitional Cough: Strong Volitional Swallow: Able to elicit    Oral/Motor/Sensory Function Overall Oral Motor/Sensory Function: Within functional limits   Ice Chips Ice chips: Not tested   Thin Liquid Thin Liquid: Within functional limits Presentation: Cup;Self Fed    Nectar Thick     Honey Thick     Puree Puree: Within functional limits Presentation: Self Fed;Spoon   Solid     Solid: Within functional limits Presentation: Self Fredirick Lathe 04/10/2021,4:03 PM  Kathleen Lime, MS [place] Office [phone] Pager [phone]

## 2021-04-11 ENCOUNTER — Inpatient Hospital Stay (HOSPITAL_COMMUNITY): Payer: Medicare HMO

## 2021-04-11 DIAGNOSIS — I340 Nonrheumatic mitral (valve) insufficiency: Secondary | ICD-10-CM

## 2021-04-11 DIAGNOSIS — R0602 Shortness of breath: Secondary | ICD-10-CM | POA: Diagnosis not present

## 2021-04-11 LAB — CBC WITH DIFFERENTIAL/PLATELET
Abs Immature Granulocytes: 0.03 10*3/uL (ref 0.00–0.07)
Basophils Absolute: 0.1 10*3/uL (ref 0.0–0.1)
Basophils Relative: 1 %
Eosinophils Absolute: 0.3 10*3/uL (ref 0.0–0.5)
Eosinophils Relative: 5 %
HCT: 44.4 % (ref 39.0–52.0)
Hemoglobin: 13.3 g/dL (ref 13.0–17.0)
Immature Granulocytes: 1 %
Lymphocytes Relative: 38 %
Lymphs Abs: 2.4 10*3/uL (ref 0.7–4.0)
MCH: 24.5 pg — ABNORMAL LOW (ref 26.0–34.0)
MCHC: 30 g/dL (ref 30.0–36.0)
MCV: 81.8 fL (ref 80.0–100.0)
Monocytes Absolute: 0.5 10*3/uL (ref 0.1–1.0)
Monocytes Relative: 8 %
Neutro Abs: 3 10*3/uL (ref 1.7–7.7)
Neutrophils Relative %: 47 %
Platelets: 367 10*3/uL (ref 150–400)
RBC: 5.43 MIL/uL (ref 4.22–5.81)
RDW: 15.7 % — ABNORMAL HIGH (ref 11.5–15.5)
WBC: 6.3 10*3/uL (ref 4.0–10.5)
nRBC: 0 % (ref 0.0–0.2)

## 2021-04-11 LAB — COMPREHENSIVE METABOLIC PANEL
ALT: 24 U/L (ref 0–44)
AST: 20 U/L (ref 15–41)
Albumin: 2.9 g/dL — ABNORMAL LOW (ref 3.5–5.0)
Alkaline Phosphatase: 52 U/L (ref 38–126)
Anion gap: 12 (ref 5–15)
BUN: 16 mg/dL (ref 8–23)
CO2: 26 mmol/L (ref 22–32)
Calcium: 9.2 mg/dL (ref 8.9–10.3)
Chloride: 97 mmol/L — ABNORMAL LOW (ref 98–111)
Creatinine, Ser: 1.51 mg/dL — ABNORMAL HIGH (ref 0.61–1.24)
GFR, Estimated: 52 mL/min — ABNORMAL LOW (ref >60)
Glucose, Bld: 120 mg/dL — ABNORMAL HIGH (ref 70–99)
Potassium: 3.4 mmol/L — ABNORMAL LOW (ref 3.5–5.1)
Sodium: 135 mmol/L (ref 135–145)
Total Bilirubin: 0.8 mg/dL (ref 0.3–1.2)
Total Protein: 8 g/dL (ref 6.5–8.1)

## 2021-04-11 LAB — GLUCOSE, CAPILLARY
Glucose-Capillary: 157 mg/dL — ABNORMAL HIGH (ref 70–99)
Glucose-Capillary: 118 mg/dL — ABNORMAL HIGH (ref 70–99)

## 2021-04-11 LAB — MAGNESIUM: Magnesium: 2.3 mg/dL (ref 1.7–2.4)

## 2021-04-11 MED ORDER — SPIRONOLACTONE 25 MG PO TABS: 25.0000 mg | ORAL_TABLET | Freq: Every day | ORAL | 0 refills | Status: DC

## 2021-04-11 MED ORDER — EMPAGLIFLOZIN 10 MG PO TABS: 10.0000 mg | ORAL_TABLET | Freq: Every day | ORAL | 0 refills | Status: DC

## 2021-04-11 MED ORDER — SACUBITRIL-VALSARTAN 24-26 MG PO TABS: 1.0000 | ORAL_TABLET | Freq: Two times a day (BID) | ORAL | 0 refills | Status: DC

## 2021-04-11 MED ORDER — LACTATED RINGERS IV BOLUS
500.0000 mL | Freq: Once | INTRAVENOUS | Status: AC
  Administered 2021-04-11: 12:00:00 500 mL via INTRAVENOUS

## 2021-04-11 MED ORDER — GADOBUTROL 1 MMOL/ML IV SOLN
10.0000 mL | Freq: Once | INTRAVENOUS | Status: AC | PRN
  Administered 2021-04-11: 11:00:00 10 mL via INTRAVENOUS

## 2021-04-11 MED ORDER — SACUBITRIL-VALSARTAN 24-26 MG PO TABS: 1.0000 | ORAL_TABLET | Freq: Two times a day (BID) | ORAL | Status: DC

## 2021-04-11 MED ORDER — AMIODARONE HCL 200 MG PO TABS
ORAL_TABLET | ORAL | 0 refills | Status: DC
Start: 2021-04-11 — End: 2021-05-19

## 2021-04-11 MED ORDER — DIGOXIN 125 MCG PO TABS: 0.1250 mg | ORAL_TABLET | Freq: Every day | ORAL | 0 refills | Status: DC

## 2021-04-11 MED ORDER — SPIRONOLACTONE 25 MG PO TABS: 25.0000 mg | ORAL_TABLET | Freq: Every day | ORAL | Status: DC

## 2021-04-11 MED ORDER — POTASSIUM CHLORIDE CRYS ER 20 MEQ PO TBCR
40.0000 meq | EXTENDED_RELEASE_TABLET | Freq: Once | ORAL | Status: AC
  Administered 2021-04-11: 14:00:00 40 meq via ORAL
  Filled 2021-04-11: qty 2

## 2021-04-11 NOTE — Discharge Instructions (Addendum)
Follow with Primary MD [place], Julie M, NP in 7 days   Get CBC, CMP, Magnesium, 2 view Chest X ray -  checked next visit within 1 week by Primary MD     Activity: As tolerated with Full fall precautions use walker/cane & assistance as needed  Disposition Home    Diet: Heart Healthy Low Carb. Check your Weight same time everyday, if you gain over 2 pounds, or you develop in leg swelling, experience more shortness of breath or chest pain, call your Primary MD immediately. Follow Cardiac Low Salt Diet and 1.5 lit/day fluid restriction.  Accuchecks 4 times/day, Once in AM empty stomach and then before each meal. Log in all results and show them to your Prim.MD in 3 days. If any glucose reading is under 80 or above 300 call your Prim MD immidiately. Follow Low glucose instructions for glucose under 80 as instructed.  Special Instructions: If you have smoked or chewed Tobacco  in the last 2 yrs please stop smoking, stop any regular Alcohol  and or any Recreational drug use.  On your next visit with your primary care physician please Get Medicines reviewed and adjusted.  Please request your Prim.MD to go over all Hospital Tests and Procedure/Radiological results at the follow up, please get all Hospital records sent to your Prim MD by signing hospital release before you go home.  If you experience worsening of your admission symptoms, develop shortness of breath, life threatening emergency, suicidal or homicidal thoughts you must seek medical attention immediately by calling 911 or calling your MD immediately  if symptoms less severe.  You Must read complete instructions/literature along with all the possible adverse reactions/side effects for all the Medicines you take and that have been prescribed to you. Take any new Medicines after you have completely understood and accpet all the possible adverse reactions/side effects.

## 2021-04-11 NOTE — Care Management Important Message (Signed)
Important Message  Patient Details  Name: BERTRUM HELMSTETTER MRN: [id number] Date of Birth: 1960/09/03   Medicare Important Message Given:  Yes     Shelda Altes 04/11/2021, 10:15 AM

## 2021-04-11 NOTE — Discharge Summary (Signed)
Raymond Allen XBM:[id number] DOB: [birth year] DOA: 03/29/2021  PCP: Martinique, Julie M, NP  Admit date: 03/29/2021  Discharge date: 04/11/2021  Admitted From: Home   Disposition:  Home   Recommendations for Outpatient Follow-up:   Follow up with PCP in 1-2 weeks  PCP Please obtain BMP/CBC, 2 view CXR in 1week,  (see Discharge instructions)   PCP Please follow up on the following pending results: Monitor BMP, magnesium, blood pressure and CBGs closely   Home Health: None   Equipment/Devices: None  Consultations: None PCCM, Cards Discharge Condition: Stable    CODE STATUS: Full    Diet Recommendation: Heart Healthy Low Carb, 1.5 lit /day fluid restriction  Diet Order             DIET SOFT Room service appropriate? Yes; Fluid consistency: Thin  Diet effective now                    Chief Complaint  Patient presents with   Shortness of Breath     Brief history of present illness from the day of admission and additional interim summary    60 yo male former smoker, H/O DM type 2, DVT on xarelto, HTN, Colon cancer s/p partial colectomy 2001 with liver mets s/p Lt hepatectomy, CKD 3a, ED presented with several weeks of dyspnea and edema.  Was diagnosed with acute hypoxic respiratory failure due to CHF he was intubated kept in ICU, CHF team also was involved.  He was extubated on 04/05/2021 and transferred to hospitalist service on 04/07/2021 on day 9 of his hospital stay.                                                                 Hospital Course   Acute Hypoxic Resp. Failure due to Acute on chronic systolic CHF with EF 02% in the presence of mitral regurgitation and dilated cardiomyopathy. He required intubation for several days, extubated on 04/05/2021, he has been adequately diuresed with IV Lasix,  also was on Entresto, Aldactone, amiodarone, digoxin on board.  CHF team following.  Not on beta-blocker yet . Now euvolemic, DC home on Aldactone, Dig, Amio, Entresto and empagliflozin ( insurance coverage) . DW Dr Jeffie Pollock - DC home with Cards follow up.   2.  AKI due to CHF.  Initially AKI was due to fluid overload now due to overdiuresis, Improving, gentle IVF 04/10/21.   3.  Acute metabolic encephalopathy in the setting of ICU and respiratory failure.  Briefly required Precedex.  Much improved now at baseline.   4. Obesity.  BMI of 35.  Follow with PCP for weight loss.   5.  A flutter with [place] vas 2 score of 4.  Currently on p.o. amiodarone with Xarelto.  Cardiology on board.   6.  Moderate  to severe MR.  Defer to cardiology.  Cardiac MRI ordered outpt per Cards.   7. Dyslipidemia.  On statin continue.    8. Severe weakness and deconditioning.  PT OT, did well, DC home.   9. DM type II.  Home Rx, added empagliflozin.  Discharge diagnosis     Active Problems:   Essential hypertension   Respiratory failure, acute (HCC)   Colon cancer metastasized to liver (HCC)   Acute on chronic congestive heart failure (HCC)   Type 2 diabetes mellitus without complication ([place])   Pulmonary edema   Acute metabolic encephalopathy   CKD (chronic kidney disease) stage 3, GFR 30-59 ml/min [facility])   Atrial arrhythmia    Discharge instructions    Discharge Instructions     Discharge instructions   Complete by: As directed    Follow with Primary MD Martinique, Julie M, NP in 7 days   Get CBC, CMP, Magnesium, 2 view Chest X ray -  checked next visit within 1 week by Primary MD     Activity: As tolerated with Full fall precautions use walker/cane & assistance as needed  Disposition Home    Diet: Heart Healthy Low Carb. Check your Weight same time everyday, if you gain over 2 pounds, or you develop in leg swelling, experience more shortness of breath or chest pain, call your Primary MD  immediately. Follow Cardiac Low Salt Diet and 1.5 lit/day fluid restriction.  Accuchecks 4 times/day, Once in AM empty stomach and then before each meal. Log in all results and show them to your Prim.MD in 3 days. If any glucose reading is under 80 or above 300 call your Prim MD immidiately. Follow Low glucose instructions for glucose under 80 as instructed.   Special Instructions: If you have smoked or chewed Tobacco  in the last 2 yrs please stop smoking, stop any regular Alcohol  and or any Recreational drug use.  On your next visit with your primary care physician please Get Medicines reviewed and adjusted.  Please request your Prim.MD to go over all Hospital Tests and Procedure/Radiological results at the follow up, please get all Hospital records sent to your Prim MD by signing hospital release before you go home.  If you experience worsening of your admission symptoms, develop shortness of breath, life threatening emergency, suicidal or homicidal thoughts you must seek medical attention immediately by calling 911 or calling your MD immediately  if symptoms less severe.  You Must read complete instructions/literature along with all the possible adverse reactions/side effects for all the Medicines you take and that have been prescribed to you. Take any new Medicines after you have completely understood and accpet all the possible adverse reactions/side effects.   Increase activity slowly   Complete by: As directed    No wound care   Complete by: As directed        Discharge Medications   Allergies as of 04/11/2021   No Known Allergies      Medication List     STOP taking these medications    amLODipine 5 MG tablet Commonly known as: NORVASC   doxycycline 100 MG tablet Commonly known as: VIBRA-TABS   furosemide 20 MG tablet Commonly known as: LASIX   hydrochlorothiazide 25 MG tablet Commonly known as: HYDRODIURIL   lisinopril 5 MG tablet Commonly known as:  ZESTRIL   oxyCODONE-acetaminophen 5-325 MG tablet Commonly known as: PERCOCET/ROXICET       TAKE these medications    amiodarone 200 MG tablet  Commonly known as: PACERONE Take 2 pills daily for 1 week then once a day   cyclobenzaprine 10 MG tablet Commonly known as: FLEXERIL Take 10 mg by mouth 3 (three) times daily as needed for muscle spasms.   digoxin 0.125 MG tablet Commonly known as: LANOXIN Take 1 tablet (0.125 mg total) by mouth daily. Start taking on: April 12, 2021   empagliflozin 10 MG Tabs tablet Commonly known as: Jardiance Take 1 tablet (10 mg total) by mouth daily.   gabapentin 300 MG capsule Commonly known as: NEURONTIN Take 600-900 mg by mouth See admin instructions. Take 600 mg by mouth in the morning and afternoon, then 900mg  at bedtime   metFORMIN 500 MG 24 hr tablet Commonly known as: GLUCOPHAGE-XR Take 1,000 mg by mouth 2 (two) times daily.   Ozempic (1 MG/DOSE) 4 MG/3ML Sopn Generic drug: Semaglutide (1 MG/DOSE) Inject 1 mg into the skin every Monday.   rosuvastatin 20 MG tablet Commonly known as: CRESTOR Take 20 mg by mouth daily.   sacubitril-valsartan 24-26 MG Commonly known as: ENTRESTO Take 1 tablet by mouth 2 (two) times daily. Start taking on: April 12, 2021   sildenafil 20 MG tablet Commonly known as: REVATIO Take 20 mg by mouth daily as needed (ED).   spironolactone 25 MG tablet Commonly known as: ALDACTONE Take 1 tablet (25 mg total) by mouth daily. Start taking on: April 12, 2021   Tyler Aas FlexTouch 200 UNIT/ML FlexTouch Pen Generic drug: insulin degludec Inject 30 Units into the skin every evening.   Xarelto 20 MG Tabs tablet Generic drug: rivaroxaban Take 20 mg by mouth at bedtime.         Follow-up Information     Martinique, Julie M, NP. Schedule an appointment as soon as possible for a visit in 1 week(s).   Specialty: Nurse Practitioner Contact information: [place] [address] [phone]         Bensimhon, Shaune Pascal, MD. Schedule an appointment as soon as possible for a visit in 1 week(s).   Specialty: Cardiology Contact information: [address] [place] [address] [phone]                 Major procedures and Radiology Reports - PLEASE review detailed and final reports thoroughly  -       DG Abd 1 View  Result Date: 04/05/2021 CLINICAL DATA:  Check gastric catheter placement EXAM: ABDOMEN - 1 VIEW COMPARISON:  Film from earlier in the same day. FINDINGS: Gastric catheter is again identified within the stomach. No obstructive changes are seen. Postsurgical changes on the right are noted. No free air is seen. IMPRESSION: Gastric catheter in the stomach. Electronically Signed   By: Inez Catalina M.D.   On: 04/05/2021 23:25   DG Abdomen 1 View  Result Date: 03/29/2021 CLINICAL DATA:  Placement of enteric tube EXAM: ABDOMEN - 1 VIEW COMPARISON:  Place the tip and side port within the stomach. FINDINGS: There is interval placement of enteric tube with its tip close to the gastroesophageal junction. Side port is in the lower thoracic esophagus. Enteric tube should be advanced 10 cm. Upper lung fields are not included in the radiograph. Transverse diameter of heart is increased. Central pulmonary vessels are prominent. Breathing motion limits evaluation of the lung fields. IMPRESSION: Tip of enteric tube is at the gastroesophageal junction. Side port in the enteric tube is in the lower thoracic esophagus. Enteric tube should be advanced  10 cm. These results will be called to the ordering clinician or representative by the Radiologist Assistant, and communication documented in the PACS or [employer]. Electronically Signed   By: Elmer Picker M.D.   On: 03/29/2021 11:57   CARDIAC CATHETERIZATION  Result Date: 04/01/2021   Prox RCA to Mid RCA lesion is 40% stenosed.   Prox Cx to Mid Cx lesion is  50% stenosed.   Mid LAD lesion is 30% stenosed. Findings: Ao = 61/48 (52) -> NE restarted LV = 94/27 -> on NE RA =  8 RV = 27/8 PA = 29/12 (22) PCW = 14 Fick cardiac output/index = 6.5/2.7 (prior to NE) PVR = 1.2 WU SVR = 539 Ao sat = 95% PA sat = 66%, 64% PAPi = 2.1 Assessment: 1. Non-obstructive CAD 2. Severe NICM 3. Preserved CO (prior to NE) Plan/Discussion: He has severe NICM. Low SVR and need to re-initiate NE in setting of preserved CO raises concern for distributive component to his hypotension. Glori Bickers, MD 8:16 PM  DG Chest Port 1 View  Result Date: 04/06/2021 CLINICAL DATA:  Pulmonary edema J81.1 (ICD-10-CM) EXAM: PORTABLE CHEST 1 VIEW COMPARISON:  04/01/2021. FINDINGS: Gastric tube courses below the diaphragm with the tip in the expected region the stomach. Side port is below the GE junction. Similar positioning of a left IJ central venous catheter with the tip projecting at the expected junction of the brachiocephalic vein and SVC. Endotracheal tube is no longer visualized, presumably related to extubation. Similar enlargement of the cardiac silhouette. Similar pulmonary vascular congestion. Similar patchy/heterogeneous opacities bilaterally. No definite pleural effusions or pneumothorax on this single AP semi erect radiograph. IMPRESSION: 1. Cardiomegaly and pulmonary vascular congestion. 2. Similar heterogeneous opacities bilaterally, possibly due to pulmonary edema or atelectasis. Electronically Signed   By: Margaretha Sheffield M.D.   On: 04/06/2021 08:18   DG Chest Port 1 View  Result Date: 04/01/2021 CLINICAL DATA:  Respiratory failure EXAM: PORTABLE CHEST 1 VIEW COMPARISON:  Chest x-ray dated March 31, 2021 FINDINGS: ETT tip is at the thoracic inlet. Left IJ line with tip at the junction of the brachiocephalic vein and SVC. Feeding tube tip is in the stomach. Increased bilateral heterogeneous pulmonary opacities. IMPRESSION: 1. ETT tip is at the thoracic inlet, consider  advancement for optimal positioning. 2. Increased bilateral heterogeneous pulmonary opacities, possibly due to worsening atelectasis or pulmonary edema. Electronically Signed   By: Yetta Glassman M.D.   On: 04/01/2021 09:27   DG Chest Port 1 View  Result Date: 03/31/2021 CLINICAL DATA:  Respiratory failure EXAM: PORTABLE CHEST 1 VIEW COMPARISON:  Chest x-ray dated March 29, 2021 FINDINGS: Enteric tube tip projects over the midthoracic trachea. Left IJ line with tip at the junction of the brachycephalic vein and SVC. Interval removal of NG/OG tube and placement of feeding tube which courses below the diaphragm. Unchanged enlarged cardiac and mediastinal contours. Lower lung predominant heterogeneous opacities, likely combination atelectasis, pulmonary edema and layering pleural effusions. No evidence of pneumothorax. IMPRESSION: 1. Interval removal of NG/OG tube and placement of feeding tube which courses below the diaphragm. 2. Similar lower lung predominant heterogeneous opacities. Electronically Signed   By: Yetta Glassman M.D.   On: 03/31/2021 08:49   DG Chest Portable 1 View  Result Date: 03/29/2021 CLINICAL DATA:  Endotracheal tube and central line placement. EXAM: PORTABLE CHEST 1 VIEW COMPARISON:  Chest radiograph performed earlier on the same date FINDINGS: The heart is enlarged. Prominence of the pulmonary vessels. Bilateral lower  lobe hazy opacities concerning for pulmonary edema and/or small effusions. Endotracheal tube with distal tip at the level of the clavicular heads. Feeding tube coursing below the diaphragm with side port likely in the distal esophagus. Left IJ access central line with distal tip in the SVC. IMPRESSION: 1. Stable cardiomegaly with pulmonary edema and/or small effusions, suggesting CHF. 2. Endotracheal tube with distal tip approximately 7 cm above the carina. 3. Feeding tube coursing below the diaphragm with side port likely in the distal esophagus. It could be  advanced 5-7 cm. 4.  Left IJ access central line with distal tip in the SVC. Electronically Signed   By: Keane Police D.O.   On: 03/29/2021 11:12   DG Chest Portable 1 View  Result Date: 03/29/2021 CLINICAL DATA:  60 year old male with history of shortness of breath, hypoxia and lower extremity edema. EXAM: PORTABLE CHEST 1 VIEW COMPARISON:  Chest x-ray 01/22/2018. FINDINGS: There is cephalization of the pulmonary vasculature, indistinctness of the interstitial markings, and patchy airspace disease throughout the lungs bilaterally suggestive of moderate pulmonary edema. Small bilateral pleural effusions. Moderate cardiomegaly. Upper mediastinal contours are within normal limits. IMPRESSION: 1. The appearance the chest suggests moderate congestive heart failure, as above. Electronically Signed   By: Vinnie Langton M.D.   On: 03/29/2021 05:25   DG Abd Portable 1V  Result Date: 04/05/2021 CLINICAL DATA:  NG tube placement. EXAM: PORTABLE ABDOMEN - 1 VIEW COMPARISON:  Abdominal x-ray 03/30/2021. FINDINGS: Nasogastric tube tip is in the proximal stomach. The stomach is dilated. There are surgical clips in the right abdomen. IMPRESSION: 1. Nasogastric tube tip in the proximal stomach. The stomach is dilated. Recommend advancing tube. Electronically Signed   By: Ronney Asters M.D.   On: 04/05/2021 22:34   DG Abd Portable 1V  Result Date: 03/30/2021 CLINICAL DATA:  Feeding tube placement. EXAM: PORTABLE ABDOMEN - 1 VIEW COMPARISON:  Same day. FINDINGS: Distal tip of feeding tube is seen in expected position of proximal stomach. IMPRESSION: Distal tip of feeding tube seen in expected position of proximal stomach. Electronically Signed   By: Marijo Conception M.D.   On: 03/30/2021 16:11   DG Abd Portable 1V  Result Date: 03/30/2021 CLINICAL DATA:  NG tube placement. EXAM: PORTABLE ABDOMEN - 1 VIEW COMPARISON:  Earlier film, same date. FINDINGS: The NG tube remains in stable position with its tip in the  body region of the stomach. The proximal port is just below the GE junction. Stable air distended colon. IMPRESSION: NG tube in good position with the tip in the body region of the stomach. Electronically Signed   By: Marijo Sanes M.D.   On: 03/30/2021 13:28   DG Abd Portable 1V  Result Date: 03/30/2021 CLINICAL DATA:  Orogastric tube placement EXAM: PORTABLE ABDOMEN - 1 VIEW COMPARISON:  Yesterday FINDINGS: The enteric tube tip is at the left upper quadrant and presumably in the stomach. Side port is in the region of the GE junction, unchanged. Streaky density at the left more than right base. No gas dilated bowel. IMPRESSION: Unchanged positioning of the enteric tube with tip at the stomach and side port near the GE junction. Electronically Signed   By: Jorje Guild M.D.   On: 03/30/2021 05:39   DG Abd Portable 1V  Result Date: 03/29/2021 CLINICAL DATA:  NG tube placement EXAM: PORTABLE ABDOMEN - 1 VIEW COMPARISON:  03/29/2021 FINDINGS: Esophageal tube has been slightly advanced, the side port projects over distal esophageal region, tip overlies the  mid stomach. Extensive surgical clips in the upper abdomen. IMPRESSION: Esophageal tube tip overlies the stomach, the side port is in the region of the GE junction, consider further advancement for more optimal positioning. Electronically Signed   By: Donavan Foil M.D.   On: 03/29/2021 20:30   DG Abd Portable 1V  Result Date: 03/29/2021 CLINICAL DATA:  Orogastric tube placement. EXAM: PORTABLE ABDOMEN - 1 VIEW COMPARISON:  Radiographs 03/29/2021.  CT 12/17/2019. FINDINGS: 1410 hours. Single-view centered on the upper abdomen. Enteric tube position is similar to prior radiograph, with tip near the gastroesophageal junction and side hole overlying the distal esophagus. The visualized bowel gas pattern is nonobstructive. Multiple surgical clips are present within the upper abdomen. There are patchy left greater than right basilar airspace opacities and  a probable small left pleural effusion. Multiple telemetry leads overlie the chest and upper abdomen. IMPRESSION: Unchanged position of the enteric tube from earlier radiograph, tip near the expected gastroesophageal junction. Electronically Signed   By: Richardean Sale M.D.   On: 03/29/2021 14:21   ECHOCARDIOGRAM COMPLETE  Result Date: 03/29/2021    ECHOCARDIOGRAM REPORT   Patient Name:   Raymond Allen Date of Exam: 03/29/2021 Medical Rec #:  [id number]        Height:       73.0 in Accession #:    [id number]       Weight:       288.0 lb Date of Birth:  Sep 13, 1960        BSA:          2.512 m Patient Age:    60 years         BP:           134/93 mmHg Patient Gender: M                HR:           119 bpm. Exam Location:  Inpatient Procedure: 2D Echo, Color Doppler, Cardiac Doppler and Intracardiac            Opacification Agent STAT ECHO Indications:    Pulmonary edema  History:        Patient has no prior history of Echocardiogram examinations.                 Risk Factors:Hypertension and Diabetes.  Sonographer:    Jyl Heinz Referring Phys: [id number] Ramona  1. No left ventricular thrombus (with Definity). Left ventricular ejection fraction, by estimation, is 25 to 30%. The left ventricle has severely decreased function. The left ventricle demonstrates global hypokinesis. The left ventricular internal cavity size was severely dilated. There is mild eccentric left ventricular hypertrophy. Indeterminate diastolic filling due to E-A fusion.  2. Right ventricular systolic function is mildly reduced. The right ventricular size is mildly enlarged. There is mildly elevated pulmonary artery systolic pressure. The estimated right ventricular systolic pressure is 16.0 mmHg.  3. Left atrial size was moderately dilated.  4. The mitral valve is abnormal. Moderate to severe mitral valve regurgitation.  5. The aortic valve is tricuspid. Aortic valve regurgitation is mild. Aortic valve  sclerosis/calcification is present, without any evidence of aortic stenosis.  6. The inferior vena cava is normal in size with <50% respiratory variability, suggesting right atrial pressure of 8 mmHg. FINDINGS  Left Ventricle: No left ventricular thrombus (with Definity). Left ventricular ejection fraction, by estimation, is 25 to 30%. The left ventricle has severely decreased function. The left ventricle demonstrates  global hypokinesis. The left ventricular internal cavity size was severely dilated. There is mild eccentric left ventricular hypertrophy. Indeterminate diastolic filling due to E-A fusion. Right Ventricle: The right ventricular size is mildly enlarged. No increase in right ventricular wall thickness. Right ventricular systolic function is mildly reduced. There is mildly elevated pulmonary artery systolic pressure. The tricuspid regurgitant  velocity is 2.74 m/s, and with an assumed right atrial pressure of 8 mmHg, the estimated right ventricular systolic pressure is 70.9 mmHg. Left Atrium: Left atrial size was moderately dilated. Right Atrium: Right atrial size was normal in size. Pericardium: There is no evidence of pericardial effusion. Mitral Valve: The mitral valve is abnormal. Moderate to severe mitral valve regurgitation, with centrally-directed jet. Tricuspid Valve: The tricuspid valve is normal in structure. Tricuspid valve regurgitation is mild. Aortic Valve: The aortic valve is tricuspid. Aortic valve regurgitation is mild. Aortic regurgitation PHT measures 496 msec. Aortic valve sclerosis/calcification is present, without any evidence of aortic stenosis. Aortic valve peak gradient measures 3.4  mmHg. Pulmonic Valve: The pulmonic valve was grossly normal. Pulmonic valve regurgitation is not visualized. Aorta: The aortic root and ascending aorta are structurally normal, with no evidence of dilitation. Venous: The inferior vena cava is normal in size with less than 50% respiratory variability,  suggesting right atrial pressure of 8 mmHg. IAS/Shunts: No atrial level shunt detected by color flow Doppler. Additional Comments: A device lead is visualized in the right ventricle.  LEFT VENTRICLE PLAX 2D LVIDd:         7.20 cm      Diastology LVIDs:         6.00 cm      LV e' medial:    3.81 cm/s LV PW:         1.10 cm      LV E/e' medial:  27.6 LV IVS:        1.30 cm      LV e' lateral:   8.70 cm/s LVOT diam:     2.40 cm      LV E/e' lateral: 12.1 LV SV:         35 LV SV Index:   14 LVOT Area:     4.52 cm  LV Volumes (MOD) LV vol d, MOD A2C: 296.0 ml LV vol d, MOD A4C: 258.0 ml LV vol s, MOD A2C: 205.0 ml LV vol s, MOD A4C: 177.0 ml LV SV MOD A2C:     91.0 ml LV SV MOD A4C:     258.0 ml LV SV MOD BP:      87.0 ml RIGHT VENTRICLE            IVC RV Basal diam:  4.10 cm    IVC diam: 1.10 cm RV Mid diam:    3.80 cm RV S prime:     8.92 cm/s TAPSE (M-mode): 1.8 cm LEFT ATRIUM              Index        RIGHT ATRIUM           Index LA diam:        4.20 cm  1.67 cm/m   RA Area:     16.40 cm LA Vol (A2C):   101.0 ml 40.20 ml/m  RA Volume:   40.30 ml  16.04 ml/m LA Vol (A4C):   96.2 ml  38.29 ml/m LA Biplane Vol: 102.0 ml 40.60 ml/m  AORTIC VALVE AV Area (Vmax): 2.92 cm AV Vmax:  91.70 cm/s AV Peak Grad:   3.4 mmHg LVOT Vmax:      59.10 cm/s LVOT Vmean:     44.600 cm/s LVOT VTI:       0.076 m AI PHT:         496 msec  AORTA Ao Root diam: 3.60 cm Ao Asc diam:  3.40 cm MITRAL VALVE                TRICUSPID VALVE MV Area (PHT): 6.12 cm     TR Peak grad:   30.0 mmHg MV Decel Time: 124 msec     TR Vmax:        274.00 cm/s MR Peak grad: 67.2 mmHg MR Mean grad: 47.5 mmHg     SHUNTS MR Vmax:      410.00 cm/s   Systemic VTI:  0.08 m MR Vmean:     334.0 cm/s    Systemic Diam: 2.40 cm MV E velocity: 105.00 cm/s Dani Gobble Croitoru MD Electronically signed by Sanda Klein MD Signature Date/Time: 03/29/2021/10:50:12 AM    Final        Today   Subjective    Kathee Delton today has no headache,no chest abdominal  pain,no new weakness tingling or numbness, feels much better wants to go home today.     Objective   Blood pressure (!) 128/93, pulse 87, temperature 97.8 F (36.6 C), temperature source Oral, resp. rate 18, height 6\' 1"  (1.854 m), weight 121.3 kg, SpO2 100 %.   Intake/Output Summary (Last 24 hours) at 04/11/2021 1246 Last data filed at 04/11/2021 1223 Gross per 24 hour  Intake 1708 ml  Output 840 ml  Net 868 ml    Exam  Awake Alert, No new F.N deficits, Normal affect [place].AT,PERRAL Supple Neck,No JVD, No cervical lymphadenopathy appriciated.  Symmetrical Chest wall movement, Good air movement bilaterally, CTAB RRR,No Gallops,Rubs or new Murmurs, No Parasternal Heave +ve B.Sounds, Abd Soft, Non tender, No organomegaly appriciated, No rebound -guarding or rigidity. No Cyanosis, Clubbing or edema, No new Rash or bruise   Data Review   CBC w Diff:  Lab Results  Component Value Date   WBC 6.3 04/11/2021   HGB 13.3 04/11/2021   HGB 15.0 01/01/2013   HCT 44.4 04/11/2021   HCT 46.6 01/01/2013   PLT 367 04/11/2021   PLT 245 01/01/2013   LYMPHOPCT 38 04/11/2021   LYMPHOPCT 58.8 (H) 01/01/2013   MONOPCT 8 04/11/2021   MONOPCT 7.8 01/01/2013   EOSPCT 5 04/11/2021   EOSPCT 2.8 01/01/2013   BASOPCT 1 04/11/2021   BASOPCT 1.3 01/01/2013    CMP:  Lab Results  Component Value Date   NA 135 04/11/2021   NA 139 01/01/2013   K 3.4 (L) 04/11/2021   K 4.1 01/01/2013   CL 97 (L) 04/11/2021   CL 102 07/01/2012   CO2 26 04/11/2021   CO2 27 01/01/2013   BUN 16 04/11/2021   BUN 14.5 01/01/2013   CREATININE 1.51 (H) 04/11/2021   CREATININE 1.2 01/01/2013   PROT 8.0 04/11/2021   PROT 7.6 01/01/2013   ALBUMIN 2.9 (L) 04/11/2021   ALBUMIN 3.6 01/01/2013   BILITOT 0.8 04/11/2021   BILITOT 1.39 (H) 01/01/2013   ALKPHOS 52 04/11/2021   ALKPHOS 63 01/01/2013   AST 20 04/11/2021   AST 40 (H) 01/01/2013   ALT 24 04/11/2021   ALT 27 01/01/2013  . Lab Results  Component Value  Date   HGBA1C 7.7 (H) 03/29/2021     Total  Time in preparing paper work, data evaluation and todays exam - 65 minutes  Lala Lund M.D on 04/11/2021 at 12:46 PM  [facility]

## 2021-04-11 NOTE — Progress Notes (Signed)
Pt has orders to be discharged. Discharge instructions given and pt has no additional questions at this time. Medication regimen reviewed and pt educated. Pt verbalized understanding and has no additional questions. Telemetry box removed. IV removed and site in good condition. Pt stable and waiting for transportation. Potassium PO given prior to discharge.

## 2021-04-11 NOTE — Plan of Care (Signed)

## 2021-04-11 NOTE — Progress Notes (Signed)
Patient ID: JANET DECESARE, male   DOB: [birth year], 60 y.o.   MRN: [id number]     Advanced Heart Failure Rounding Note  PCP-Cardiologist: Dr. Smith/Dr. Haroldine Laws    Subjective:    60/ y/o male with DM2, HTN, former smoker, remote h/o colon cancer -> liver CA (2003) admitted with acute respiratory failure and pulmonary edema -> intubated.  Completed ceftriaxone for Strep aglactiae PNA. PCT 118 => 0.22 (?)   Found to have new onset CM EF 25-30%. Subsequently developed AF with RVR. Diuresed 25 pounds. Back in NSR on amio.   Cath 12/16 minimal CAD  Lasix stopped 12/23   Given IVF yesterday. Feels better. Denies SS, orthopnea or PND     Objective:   Weight Range: 119.3 kg Body mass index is 34.7 kg/m.   Vital Signs:   Temp:  [97.8 F (36.6 C)-98.3 F (36.8 C)] 98.2 F (36.8 C) (12/25 2034) Pulse Rate:  [80-106] 81 (12/25 2034) Resp:  [16-18] 18 (12/25 2034) BP: (96-123)/(59-97) 96/59 (12/25 2034) SpO2:  [94 %-100 %] 94 % (12/25 2034) Weight:  [119.3 kg] 119.3 kg (12/25 0532) Last BM Date: 04/10/21  Weight change: Filed Weights   04/08/21 0220 04/09/21 0425 04/10/21 0532  Weight: 119.7 kg 119.1 kg 119.3 kg    Intake/Output:   Intake/Output Summary (Last 24 hours) at 04/11/2021 0337 Last data filed at 04/11/2021 0230 Gross per 24 hour  Intake 1645 ml  Output 840 ml  Net 805 ml       Physical Exam   General:  Lying in bed . No resp difficulty HEENT: normal Neck: supple. no obvious JVD. Carotids 2+ bilat; no bruits. No lymphadenopathy or thryomegaly appreciated. Cor: PMI nondisplaced. Regular rate & rhythm. No rubs, gallops or murmurs. Lungs: clear Abdomen: obese soft, nontender, nondistended. No hepatosplenomegaly. No bruits or masses. Good bowel sounds. Extremities: no cyanosis, clubbing, rash, edema Neuro: alert & orientedx3, cranial nerves grossly intact. moves all 4 extremities w/o difficulty. Affect pleasant    Telemetry   SR 80s Personally  reviewed  Labs    CBC Recent Labs    04/09/21 0656 04/10/21 0308  WBC 6.1 7.1  NEUTROABS 2.8 3.5  HGB 13.6 12.7*  HCT 45.2 42.7  MCV 82.2 82.0  PLT 323 409    Basic Metabolic Panel Recent Labs    04/09/21 0656 04/10/21 0308  NA 134* 136  K 5.4* 4.4  CL 98 99  CO2 26 29  GLUCOSE 101* 107*  BUN 22 20  CREATININE 1.31* 1.48*  CALCIUM 9.2 9.1  MG 2.3 2.2    Liver Function Tests Recent Labs    04/09/21 0656 04/10/21 0308  AST 37 19  ALT 25 25  ALKPHOS 48 46  BILITOT 2.0* 0.6  PROT 7.3 7.4  ALBUMIN 2.6* 2.6*    No results for input(s): LIPASE, AMYLASE in the last 72 hours. Cardiac Enzymes No results for input(s): CKTOTAL, CKMB, CKMBINDEX, TROPONINI in the last 72 hours.  BNP: BNP (last 3 results) Recent Labs    03/29/21 0502  BNP 391.6*     ProBNP (last 3 results) No results for input(s): PROBNP in the last 8760 hours.   D-Dimer No results for input(s): DDIMER in the last 72 hours. Hemoglobin A1C No results for input(s): HGBA1C in the last 72 hours.  Fasting Lipid Panel No results for input(s): CHOL, HDL, LDLCALC, TRIG, CHOLHDL, LDLDIRECT in the last 72 hours.  Thyroid Function Tests No results for input(s): TSH, T4TOTAL, T3FREE, THYROIDAB in  the last 72 hours.  Invalid input(s): FREET3   Other results:   Imaging    No results found.   Medications:     Scheduled Medications:  amiodarone  200 mg Oral BID   chlorhexidine  15 mL Mouth Rinse BID   dapagliflozin propanediol  10 mg Oral Daily   digoxin  0.125 mg Oral Daily   docusate sodium  100 mg Oral BID   famotidine  20 mg Oral Daily   feeding supplement  237 mL Oral BID BM   gabapentin  600 mg Oral BID WC   gabapentin  900 mg Oral QHS   insulin aspart  0-20 Units Subcutaneous TID WC   insulin aspart  0-5 Units Subcutaneous QHS   insulin glargine-yfgn  15 Units Subcutaneous Daily   lactulose  20 g Oral BID   rivaroxaban  20 mg Oral Q supper   rosuvastatin  20 mg Oral  Daily   sodium chloride flush  3 mL Intravenous Q12H    Infusions:     PRN Medications: acetaminophen, bisacodyl, cyclobenzaprine, docusate sodium, fentaNYL (SUBLIMAZE) injection, metoCLOPramide (REGLAN) injection, ondansetron (ZOFRAN) IV, polyethylene glycol   Assessment/Plan   1. Acute Systolic Heart Failure (New)  - Admitted w/ acute pulmonary edema/ hypoxic respiratory failure  - Echo LVEF 25-30%, diffuse HK, RV mildly reduced. No prior study for comparison - Etiology uncertain. H/o chemotherapy w/ Fluoropyrimidines (potentially cardiotoxic) but this was remotely. Off chemo since 2003 - Cath 12/16 minimal CAD. Preserved CO. Low SVR - Arlyce Harman and Delene Loll held with AKI - Continue farxiga, $0.00 CO Pay - Continue digoxin 0.125  - No b-blocker yet - Received 1L IVF yesterday for low volume - cMRI pending. Can do as outpatient if he is otherwise ready for d/c soon   If SCr < 1.4 and SBP >= 110 todaycan d/c home on  Farxiga 10 Entresto 24/26 bid Spiro 12.5 Dig 0.125 Amio 200 daily Xarelto   Will arrange f/u in HF Clinic and outpatient cMRI.    2. Septic Shock  - Afebrile  - PCT 0.32 -> 118 -> 0.22 (?)  - Resolved, stable BP.  - Completed ceftriaxone for Strep agalactiae.  - No change   3. Acute Hypoxic Respiratory Failure - 2/2 acute pulmonary edema and PNA - intubated, vent management per PCCM  - extubated 12/20 - O2 stable on 2L Lupus   4. Mitral Regurgitation  - Mod-severe on echo. No significant v-waves in PCWP tracing on cath - Suspect functional in setting of severely dilated LV  - Doubt this is major driver currently.  - Will re-evaluate with cMRI  5. AKI on CKD Stage II due to shock - Baseline SCr ~1.2  - AKI improved with holding diuretics.  - Plan as above   6. Hypokalemia -  labs pending   7. Type 2DM  - Hgb A1c 7.7  - Continue Farxiga  8. Atrial Tach vs AFlutter w/ RVR - In SR  -Continue amio 200 mg BID - On Xarelto 20  9. AKI - due  to overdiuresis - diuretics and Entresto/spiro held as above - Rec'd IVF 12/25 - plan as above    Length of Stay: [place], MD  04/11/2021, 3:37 AM  Advanced Heart Failure Team Pager [phone] (M-F; 7a - 5p)  Please contact [facility] for night-coverage after hours (5p -7a ) and weekends on [email or web address]

## 2021-04-22 ENCOUNTER — Other Ambulatory Visit: Payer: Self-pay

## 2021-04-22 ENCOUNTER — Encounter (HOSPITAL_COMMUNITY): Payer: Self-pay

## 2021-04-22 ENCOUNTER — Ambulatory Visit (HOSPITAL_COMMUNITY)
Admission: RE | Admit: 2021-04-22 | Discharge: 2021-04-22 | Disposition: A | Discharge status: Home / Self Care | Payer: Medicare HMO | Source: Ambulatory Visit | Attending: Family Medicine | Admitting: Family Medicine

## 2021-04-22 VITALS — BP 138/98 | HR 90 | Ht 72.0 in | Wt 270.0 lb

## 2021-04-22 DIAGNOSIS — M545 Low back pain, unspecified: Secondary | ICD-10-CM | POA: Insufficient documentation

## 2021-04-22 DIAGNOSIS — Z86718 Personal history of other venous thrombosis and embolism: Secondary | ICD-10-CM | POA: Diagnosis not present

## 2021-04-22 DIAGNOSIS — I428 Other cardiomyopathies: Secondary | ICD-10-CM | POA: Diagnosis not present

## 2021-04-22 DIAGNOSIS — Z8709 Personal history of other diseases of the respiratory system: Secondary | ICD-10-CM

## 2021-04-22 DIAGNOSIS — Z9221 Personal history of antineoplastic chemotherapy: Secondary | ICD-10-CM | POA: Diagnosis not present

## 2021-04-22 DIAGNOSIS — R269 Unspecified abnormalities of gait and mobility: Secondary | ICD-10-CM | POA: Insufficient documentation

## 2021-04-22 DIAGNOSIS — I130 Hypertensive heart and chronic kidney disease with heart failure and stage 1 through stage 4 chronic kidney disease, or unspecified chronic kidney disease: Secondary | ICD-10-CM | POA: Diagnosis not present

## 2021-04-22 DIAGNOSIS — I340 Nonrheumatic mitral (valve) insufficiency: Secondary | ICD-10-CM | POA: Diagnosis not present

## 2021-04-22 DIAGNOSIS — I509 Heart failure, unspecified: Secondary | ICD-10-CM

## 2021-04-22 DIAGNOSIS — Z85038 Personal history of other malignant neoplasm of large intestine: Secondary | ICD-10-CM | POA: Insufficient documentation

## 2021-04-22 DIAGNOSIS — I2510 Atherosclerotic heart disease of native coronary artery without angina pectoris: Secondary | ICD-10-CM | POA: Insufficient documentation

## 2021-04-22 DIAGNOSIS — Z8639 Personal history of other endocrine, nutritional and metabolic disease: Secondary | ICD-10-CM

## 2021-04-22 DIAGNOSIS — I5020 Unspecified systolic (congestive) heart failure: Secondary | ICD-10-CM

## 2021-04-22 DIAGNOSIS — G8929 Other chronic pain: Secondary | ICD-10-CM | POA: Insufficient documentation

## 2021-04-22 DIAGNOSIS — E1122 Type 2 diabetes mellitus with diabetic chronic kidney disease: Secondary | ICD-10-CM | POA: Diagnosis not present

## 2021-04-22 DIAGNOSIS — Z79899 Other long term (current) drug therapy: Secondary | ICD-10-CM | POA: Insufficient documentation

## 2021-04-22 DIAGNOSIS — Z87891 Personal history of nicotine dependence: Secondary | ICD-10-CM | POA: Insufficient documentation

## 2021-04-22 DIAGNOSIS — Z8505 Personal history of malignant neoplasm of liver: Secondary | ICD-10-CM | POA: Diagnosis not present

## 2021-04-22 DIAGNOSIS — Z7901 Long term (current) use of anticoagulants: Secondary | ICD-10-CM | POA: Insufficient documentation

## 2021-04-22 DIAGNOSIS — R5381 Other malaise: Secondary | ICD-10-CM

## 2021-04-22 DIAGNOSIS — N182 Chronic kidney disease, stage 2 (mild): Secondary | ICD-10-CM | POA: Insufficient documentation

## 2021-04-22 DIAGNOSIS — I471 Supraventricular tachycardia: Secondary | ICD-10-CM

## 2021-04-22 DIAGNOSIS — I5022 Chronic systolic (congestive) heart failure: Secondary | ICD-10-CM

## 2021-04-22 LAB — CBC
HCT: 45.7 % (ref 39.0–52.0)
Hemoglobin: 13.6 g/dL (ref 13.0–17.0)
MCH: 24.2 pg — ABNORMAL LOW (ref 26.0–34.0)
MCHC: 29.8 g/dL — ABNORMAL LOW (ref 30.0–36.0)
MCV: 81.3 fL (ref 80.0–100.0)
Platelets: 315 10*3/uL (ref 150–400)
RBC: 5.62 MIL/uL (ref 4.22–5.81)
RDW: 16.4 % — ABNORMAL HIGH (ref 11.5–15.5)
WBC: 4.3 10*3/uL (ref 4.0–10.5)
nRBC: 0 % (ref 0.0–0.2)

## 2021-04-22 LAB — BASIC METABOLIC PANEL
Anion gap: 10 (ref 5–15)
BUN: 11 mg/dL (ref 8–23)
CO2: 22 mmol/L (ref 22–32)
Calcium: 9.6 mg/dL (ref 8.9–10.3)
Chloride: 103 mmol/L (ref 98–111)
Creatinine, Ser: 1.13 mg/dL (ref 0.61–1.24)
GFR, Estimated: >60 mL/min (ref >60)
Glucose, Bld: 73 mg/dL (ref 70–99)
Potassium: 3.5 mmol/L (ref 3.5–5.1)
Sodium: 135 mmol/L (ref 135–145)

## 2021-04-22 LAB — DIGOXIN LEVEL: Digoxin Level: 0.3 ng/mL — ABNORMAL LOW (ref 0.8–2.0)

## 2021-04-22 MED ORDER — ENTRESTO 49-51 MG PO TABS: 1.0000 | ORAL_TABLET | Freq: Two times a day (BID) | ORAL | 6 refills | Status: DC

## 2021-04-22 NOTE — Progress Notes (Signed)
ADVANCED HF CLINIC CONSULT NOTE   Primary Care: [place], Julie M, NP HF Cardiologist: Dr. Haroldine Laws  HPI: Raymond Allen is a 61 y.o. AAM w/ remote h/o stage III colon cancer, diagnosed in October 2001, HTN, Type 2DM, HLD, former smoker, h/o LLE DVT on Xarelto, newly diagnosed systolic HF/ NICM.   He received initial adjuvant chemotherapy and underwent partial colectomy. He progressed to liver cancer in October 2002. He underwent partial hepatectomy at [facility] in February 2003. He received neoadjuvant chemotherapy with FOLFIRI then adjuvant FOLFOX following the surgery. He was put on maintenance Xeloda. He has been off all chemotherapy since September of 2003.   Admitted 12/22 with new CHF exacerbation and acute hypoxic respiratory failure, requiring intubation. Echo obtained and showed biventricular dysfunction. LVEF severely reduced 25-30% w/ global HK, RV mildly reduced , mildly elevated RVSP 38 mmH, Mod-severe MR. AHF consulted, underwent [facility] 12/22 showing minimal CAD, preserved CO, low SVR. NE re-initiated w/ concern for distributive component to his hypotension. Etiology for CM unclear, ? Cardiotoxic chemo regimen. Hospitalization c/b sepsis 2/2 PNA, atrial tachycardia with RVR, and AKI. He was extubated, GDMT titrated, amiodarone added for AT. Discharged home, weight 267 lbs.  Today he returns for post hospital HF follow up with his wife. Feels weak and is SOB walking further distances on flat ground. Now walking with a cane, this is new for him. Main complaint is chronic lower back pain, advised to stop pain meds in hospital and now having more pain.  Denies CP, dizziness, edema, or PND/Orthopnea. Appetite ok. No fever or chills. He does not weigh at home, but does have a scale. Taking all medications. Wife cooks most meals, does not drink much fluids during the day. He declined HH PT.  Cardiac Studies - Echo (12/22): biventricular dysfunction. LVEF severely reduced 25-30% w/ global HK, RV  mildly reduced , mildly elevated RVSP 38 mmH, Mod-severe MR.   - R/LHC (12/22):      Prox RCA to Mid RCA lesion is 40% stenosed.   Prox Cx to Mid Cx lesion is 50% stenosed.   Mid LAD lesion is 30% stenosed.   Findings: Ao = 61/48 (52) -> NE restarted  LV = 94/27 -> on NE RA =  8 RV = 27/8 PA = 29/12 (22) PCW = 14 Fick cardiac output/index = 6.5/2.7 (prior to NE) PVR = 1.2 WU SVR = 539 Ao sat = 95% PA sat = 66%, 64% PAPi = 2.1   Assessment: 1. Non-obstructive CAD 2. Severe NICM 3. Preserved CO (prior to NE)   Plan/Discussion:  He has severe NICM. Low SVR and need to re-initiate NE in setting of preserved CO raises concern for distributive component to his hypotension.   Review of Systems: [y] = yes, [ ]  = no   General: Weight gain [ ] ; Weight loss [ ] ; Anorexia [ ] ; Fatigue Blue.Reese ]; Fever [ ] ; Chills [ ] ; Weakness Blue.Reese ]  Cardiac: Chest pain/pressure [ ] ; Resting SOB [ ] ; Exertional SOB Blue.Reese ]; Orthopnea [ ] ; Pedal Edema [ ] ; Palpitations [ ] ; Syncope [ ] ; Presyncope [ ] ; Paroxysmal nocturnal dyspnea[ ]   Pulmonary: Cough [ ] ; Wheezing[ ] ; Hemoptysis[ ] ; Sputum [ ] ; Snoring [ ]   GI: Vomiting[ ] ; Dysphagia[ ] ; Melena[ ] ; Hematochezia [ ] ; Heartburn[ ] ; Abdominal pain [ ] ; Constipation [ ] ; Diarrhea [ ] ; BRBPR [ ]   GU: Hematuria[ ] ; Dysuria [ ] ; Nocturia[ ]   Vascular: Pain in legs with walking [ ] ; Pain in feet  with lying flat [ ] ; Non-healing sores [ ] ; Stroke [ ] ; TIA [ ] ; Slurred speech [ ] ;  Neuro: Headaches[ ] ; Vertigo[ ] ; Seizures[ ] ; Paresthesias[ ] ;Blurred vision [ ] ; Diplopia [ ] ; Vision changes [ ]   Ortho/Skin: Arthritis [ ] ; Joint pain [ ] ; Muscle pain [ ] ; Joint swelling [ ] ; Back Pain [ y]; Rash [ ]   Psych: Depression[ ] ; Anxiety[ ]   Heme: Bleeding problems [ ] ; Clotting disorders [ ] ; Anemia [ ]   Endocrine: Diabetes Blue.Reese ]; Thyroid dysfunction[ ]   Past Medical History:  Diagnosis Date   Cancer [facility])    Colon cancer metastasized to liver ([place]) 01/05/2012   Colon  carcinoma ([place])    colon ca dx 2001;   Diabetes mellitus without complication (H[facility])    Hypertension    Muscle weakness-general 01/05/2012   Current Outpatient Medications  Medication Sig Dispense Refill   amiodarone (PACERONE) 200 MG tablet Take 2 pills daily for 1 week then once a day 40 tablet 0   cyclobenzaprine (FLEXERIL) 10 MG tablet Take 10 mg by mouth 3 (three) times daily as needed for muscle spasms.     digoxin (LANOXIN) 0.125 MG tablet Take 1 tablet (0.125 mg total) by mouth daily. 30 tablet 0   empagliflozin (JARDIANCE) 10 MG TABS tablet Take 1 tablet (10 mg total) by mouth daily. 30 tablet 0   gabapentin (NEURONTIN) 300 MG capsule Take 600-900 mg by mouth See admin instructions. Take 600 mg by mouth in the morning and afternoon, then 900mg  at bedtime  6   metFORMIN (GLUCOPHAGE-XR) 500 MG 24 hr tablet Take 1,000 mg by mouth 2 (two) times daily.  1   OZEMPIC, 1 MG/DOSE, 4 MG/3ML SOPN Inject 1 mg into the skin every Monday.     rosuvastatin (CRESTOR) 20 MG tablet Take 20 mg by mouth daily.     sacubitril-valsartan (ENTRESTO) 24-26 MG Take 1 tablet by mouth 2 (two) times daily. 60 tablet 0   sildenafil (REVATIO) 20 MG tablet Take 20 mg by mouth daily as needed (ED).     spironolactone (ALDACTONE) 25 MG tablet Take 1 tablet (25 mg total) by mouth daily. 30 tablet 0   TRESIBA FLEXTOUCH 200 UNIT/ML FlexTouch Pen Inject 30 Units into the skin every evening.     XARELTO 20 MG TABS tablet Take 20 mg by mouth at bedtime.     No current facility-administered medications for this encounter.   No Known Allergies  Social History   Socioeconomic History   Marital status: Single    Spouse name: Not on file   Number of children: Not on file   Years of education: Not on file   Highest education level: Not on file  Occupational History   Not on file  Tobacco Use   Smoking status: Former    Packs/day: 0.50    Years: 32.00    Pack years: 16.00    Types: Cigarettes    Quit date:  05/19/2015    Years since quitting: 5.9   Smokeless tobacco: Never  Vaping Use   Vaping Use: Never used  Substance and Sexual Activity   Alcohol use: No   Drug use: No   Sexual activity: Not on file  Other Topics Concern   Not on file  Social History Narrative   Not on file   Social Determinants of Health   Financial Resource Strain: Not on file  Food Insecurity: Not on file  Transportation Needs: Not on file  Physical Activity: Not  on file  Stress: Not on file  Social Connections: Not on file  Intimate Partner Violence: Not on file    No family history on file.  BP (!) 138/98    Pulse 90    Ht 6' (1.829 m)    Wt 122.5 kg (270 lb)    SpO2 97%    BMI 36.62 kg/m   Wt Readings from Last 3 Encounters:  04/22/21 122.5 kg (270 lb)  04/11/21 121.3 kg (267 lb 6.7 oz)  11/22/17 131.5 kg (290 lb)   PHYSICAL EXAM: General:  NAD. No resp difficulty HEENT: Normal Neck: Supple. No JVD. Carotids 2+ bilat; no bruits. No lymphadenopathy or thryomegaly appreciated. Cor: PMI nondisplaced. Regular rate & rhythm. No rubs, gallops or murmurs. Lungs: Clear Abdomen: Obese,  nontender, nondistended. No hepatosplenomegaly. No bruits or masses. Good bowel sounds. Extremities: No cyanosis, clubbing, rash, edema Neuro: Alert & oriented x 3, cranial nerves grossly intact. Moves all 4 extremities w/o difficulty. Affect pleasant.  ECG: SR 86 bpm (personally reviewed).  ASSESSMENT & PLAN:  1. Chronic Systolic Heart Failure (New)  - Admitted 12/22 w/ acute pulmonary edema/ hypoxic respiratory failure  - Echo LVEF 25-30%, diffuse HK, RV mildly reduced. No prior study for comparison - Etiology uncertain. H/o chemotherapy w/ Fluoropyrimidines (potentially cardiotoxic) but this was remotely. Off chemo since 2003 - R/LHC 12/22 minimal CAD. Preserved CO. Low SVR - cMRI (12/22): LVEF 34%, RVEF 42%, mild MR, significantly dilated pulm artery 40 mm, AAA 45 mm - NYHA II, functional class confounded by  physical deconditioning and chronic pain. Volume looks good today.  - Continue Entresto 24/26 mg bid. - Continue Farxiga, $0.00 CO Pay - Continue spiro 25 mg daily. - Continue digoxin 0.125 mg daily. - No b-blocker yet. - I asked him to weigh daily, limit salt intake < 2 g/day and fluids < 2 L/day. - BMET and dig today, repeat BMET in 7-10 days.   3. H/o acute Hypoxic Respiratory Failure - 2/2 acute pulmonary edema and PNA, requiring intubation. - Resolved. - O2 97% on room air.   4. Mitral Regurgitation  - Mod-severe on echo. No significant v-waves in PCWP tracing on cath - Suspect functional in setting of severely dilated LV.  - Doubt this is major driver currently.  - Mild on cMRI.   5. CKD Stage II  - Baseline SCr ~1.2.  - BMET today.   6. Type 2DM  - Hgb A1c 7.7  - Continue Wilder Glade (he is uncircumcised and had some penile swelling, need to watch).   7. Atrial Tach vs AFlutter w/ RVR - In SR on ECG today. - Continue amio 200 mg daily (recently reduced). - On Xarelto 20. No bleeding issues. - CBC today.  8. H/o Hypokalemia - Labs today.  9. Physical deconditioning: - Discussed PT to help with gait and maybe chronic pain. He declined this today.  10. Chronic low back pain: - Has follow up with provider managing his pain meds. - Need to get pain under control to help preserve his mobility.  Follow up in 3 weeks with PharmD (add beta blocker) , 6 weeks with APP and 12 weeks with Dr. Haroldine Laws + echo.   Allena Katz, FNP-BC 04/22/21

## 2021-04-22 NOTE — Patient Instructions (Signed)
INCREASE Entresto to 49/51 mg one tab twice a day  Labs today We will only contact you if something comes back abnormal or we need to make some changes. Otherwise no news is good news!  Labs needed in 7-10 days   Your physician recommends that you schedule a follow-up appointment in: 3 weeks with the pharmacy team, in 6 weeks  in the Advanced Practitioners (PA/NP) Clinic and in 12 weeks with Dr Amanda Pea  Your physician has requested that you have an echocardiogram. Echocardiography is a painless test that uses sound waves to create images of your heart. It provides your doctor with information about the size and shape of your heart and how well your hearts chambers and valves are working. This procedure takes approximately one hour. There are no restrictions for this procedure.   Do the following things EVERYDAY: Weigh yourself in the morning before breakfast. Write it down and keep it in a log. Take your medicines as prescribed Eat low salt foods--Limit salt (sodium) to 2000 mg per day.  Stay as active as you can everyday Limit all fluids for the day to less than 2 liters  At the [facility], you and your health needs are our priority. As part of our continuing mission to provide you with exceptional heart care, we have created designated Provider Care Teams. These Care Teams include your primary Cardiologist (physician) and Advanced Practice Providers (APPs- Physician Assistants and Nurse Practitioners) who all work together to provide you with the care you need, when you need it.   You may see any of the following providers on your designated Care Team at your next follow up: Dr Glori Bickers Dr Haynes Kerns, NP Lyda Jester, [place] [facility] [place], [place] Audry Riles, PharmD   Please be sure to bring in all your medications bottles to every appointment.   If you have any questions or concerns before your next appointment  please send [place] a message through [place] or call our office at [phone].    TO LEAVE A MESSAGE FOR THE NURSE SELECT OPTION 2, PLEASE LEAVE A MESSAGE INCLUDING: YOUR NAME DATE OF BIRTH CALL BACK NUMBER REASON FOR CALL**this is important as we prioritize the call backs  YOU WILL RECEIVE A CALL BACK THE SAME DAY AS LONG AS YOU CALL BEFORE 4:00 PM

## 2021-04-29 ENCOUNTER — Other Ambulatory Visit: Payer: Self-pay

## 2021-04-29 ENCOUNTER — Ambulatory Visit (HOSPITAL_COMMUNITY)
Admission: RE | Admit: 2021-04-29 | Discharge: 2021-04-29 | Disposition: A | Discharge status: Home / Self Care | Payer: Medicare HMO | Source: Ambulatory Visit | Attending: Internal Medicine | Admitting: Internal Medicine

## 2021-04-29 DIAGNOSIS — I509 Heart failure, unspecified: Secondary | ICD-10-CM | POA: Diagnosis present

## 2021-04-29 LAB — BASIC METABOLIC PANEL
Anion gap: 13 (ref 5–15)
BUN: 13 mg/dL (ref 8–23)
CO2: 22 mmol/L (ref 22–32)
Calcium: 10 mg/dL (ref 8.9–10.3)
Chloride: 106 mmol/L (ref 98–111)
Creatinine, Ser: 1.13 mg/dL (ref 0.61–1.24)
GFR, Estimated: >60 mL/min (ref >60)
Glucose, Bld: 100 mg/dL — ABNORMAL HIGH (ref 70–99)
Potassium: 4.7 mmol/L (ref 3.5–5.1)
Sodium: 141 mmol/L (ref 135–145)

## 2021-05-10 NOTE — Progress Notes (Signed)
Advanced Heart Failure Clinic Note  Primary Care: Martinique, Julie M, NP HF Cardiologist: Dr. Haroldine Laws  HPI:  Raymond Allen is a 61 y.o. AAM w/ remote h/o stage III colon cancer, diagnosed in October 2001, HTN, Type 2DM, HLD, former smoker, h/o LLE DVT on Xarelto, newly diagnosed systolic HF/ NICM.    He received initial adjuvant chemotherapy and underwent partial colectomy. He progressed to liver cancer in October 2002. He underwent partial hepatectomy at [facility] in February 2003. He received neoadjuvant chemotherapy with FOLFIRI then adjuvant FOLFOX following the surgery. He was put on maintenance Xeloda. He has been off all chemotherapy since September of 2003.   Admitted 03/2021 with new CHF exacerbation and acute hypoxic respiratory failure, requiring intubation. Echo obtained and showed biventricular dysfunction. LVEF severely reduced 25-30% w/ global HK, RV mildly reduced , mildly elevated RVSP 38 mmH, Mod-severe MR. AHF consulted, underwent [facility] 12/22 showing minimal CAD, preserved CO, low SVR. NE re-initiated w/ concern for distributive component to his hypotension. Etiology for CM unclear, ? Cardiotoxic chemo regimen. Hospitalization c/b sepsis 2/2 PNA, atrial tachycardia with RVR, and AKI. He was extubated, GDMT titrated, amiodarone added for AT. Discharged home, weight 267 lbs.   Presented for post hospital HF follow up with his wife on 04/22/21. Felt weak and complained of SOB walking further distances on flat ground. Was walking with a cane, which was new for him. Main complaint was chronic lower back pain, advised to stop pain meds in hospital and now having more pain.  Denied CP, dizziness, edema, or PND/Orthopnea. Appetite was ok. No fever or chills. He reported that he does not weigh at home, but does have a scale. Reported taking all medications. Wife cooks most meals, does not drink much fluids during the day. He declined HH PT.  Today he returns to HF clinic for pharmacist  medication titration. At last visit with APP Entresto was increased to 49/51 mg BID. Main complaint today is chronic lower back pain. He was additionally frustrated that he was not sure why he was being seen in pharmacy clinic today. No dizziness unless he tries to walk to fast. Still using cane. No CP or palpitations. Still complains of SOB when walking longer distances but also notes this has been occurring for years. Believes his current heart problems are "because of the cold weather and trying to do too much". Weight has been stable at home, ~262 lbs. No LEE, PND or orthopnea.    HF Medications: Entresto 49/51 mg BID Spironolactone 25 mg daily Jardiance 10 mg daily Digoxin 0.125 mg daily  Has the patient been experiencing any side effects to the medications prescribed?  no  Does the patient have any problems obtaining medications due to transportation or finances?    - Is upset that medications have been sent to local [facility] when he could get them cheaper through [facility] mail order. Spoke with his significant other today and since he is running low she wants [facility] and then she will ask them to transfer the prescriptions to [facility] [facility] order [facility]).   Understanding of regimen: poor Understanding of indications: poor Potential of compliance: poor Patient understands to avoid NSAIDs. Patient understands to avoid decongestants.    Pertinent Lab Values: 04/29/21: Serum creatinine 1.13, BUN 13, Potassium 4.7, Sodium 141  Vital Signs: Weight: 267.4 lbs (last clinic weight: 270 lbs) Blood pressure: 132/68  Heart rate: 88   Assessment/Plan: 1. Chronic Systolic Heart Failure ([place])  - Admitted  03/2021 w/ acute pulmonary edema/ hypoxic respiratory failure  - Echo LVEF 25-30%, diffuse HK, RV mildly reduced. No prior study for comparison. - Etiology uncertain. H/o chemotherapy w/ Fluoropyrimidines (potentially cardiotoxic) but this was remote. Off chemo since 2003 -  R/LHC 03/2021 minimal CAD. Preserved CO. Low SVR. - cMRI (03/2021): LVEF 34%, RVEF 42%, mild MR, significantly dilated pulm artery 40 mm, AAA 45 mm - NYHA II, functional class confounded by physical deconditioning and chronic pain. Volume looks good today.  - Increase Entresto to 97/103 mg BID. Repeat BMET in 2 weeks.  - Continue spironolactone 25 mg daily. - Continue Farxiga 10 mg daily - Continue digoxin 0.125 mg daily. - Needs to weigh daily, limit salt intake < 2 g/day and fluids < 2 L/day.   3. H/o acute Hypoxic Respiratory Failure - 2/2 acute pulmonary edema and PNA, requiring intubation. - Resolved. - O2 97% on room air.   4. Mitral Regurgitation  - Mod-severe on echo. No significant v-waves in PCWP tracing on cath - Suspect functional in setting of severely dilated LV.  - Doubt this is major driver currently.  - Mild on cMRI.   5. CKD Stage II  - Baseline SCr ~1.2.    6. Type 2DM  - Hgb A1c 7.7  - Continue Farxiga (he is uncircumcised and had some penile swelling, need to watch).   7. Atrial Tach vs AFlutter w/ RVR - Continue amiodarone 200 mg daily  - Continue Xarelto 20 mg daily. No bleeding issues.   8. H/o Hypokalemia -Resolved - K 4.7 on last BMET 04/29/21   9. Physical deconditioning: - Previously discussed PT to help with gait and maybe chronic pain. He has declined.   10. Chronic low back pain: - Has follow up with provider managing his pain meds. - Need to get pain under control to help preserve his mobility.  Follow up 2 weeks with [facility].   Audry Riles, PharmD, BCPS, BCCP, CPP Heart Failure Clinic Pharmacist [phone]

## 2021-05-19 ENCOUNTER — Ambulatory Visit (HOSPITAL_COMMUNITY)
Admission: RE | Admit: 2021-05-19 | Discharge: 2021-05-19 | Disposition: A | Discharge status: Home / Self Care | Payer: Medicare HMO | Source: Ambulatory Visit | Attending: Internal Medicine | Admitting: Internal Medicine

## 2021-05-19 ENCOUNTER — Other Ambulatory Visit (HOSPITAL_COMMUNITY): Payer: Self-pay

## 2021-05-19 ENCOUNTER — Other Ambulatory Visit: Payer: Self-pay

## 2021-05-19 VITALS — BP 132/68 | HR 88 | Wt 267.4 lb

## 2021-05-19 DIAGNOSIS — I714 Abdominal aortic aneurysm, without rupture, unspecified: Secondary | ICD-10-CM | POA: Insufficient documentation

## 2021-05-19 DIAGNOSIS — Z7901 Long term (current) use of anticoagulants: Secondary | ICD-10-CM | POA: Diagnosis not present

## 2021-05-19 DIAGNOSIS — Z87891 Personal history of nicotine dependence: Secondary | ICD-10-CM | POA: Insufficient documentation

## 2021-05-19 DIAGNOSIS — I5022 Chronic systolic (congestive) heart failure: Secondary | ICD-10-CM | POA: Insufficient documentation

## 2021-05-19 DIAGNOSIS — E1122 Type 2 diabetes mellitus with diabetic chronic kidney disease: Secondary | ICD-10-CM | POA: Insufficient documentation

## 2021-05-19 DIAGNOSIS — E785 Hyperlipidemia, unspecified: Secondary | ICD-10-CM | POA: Diagnosis not present

## 2021-05-19 DIAGNOSIS — D631 Anemia in chronic kidney disease: Secondary | ICD-10-CM | POA: Diagnosis not present

## 2021-05-19 DIAGNOSIS — G8929 Other chronic pain: Secondary | ICD-10-CM | POA: Diagnosis not present

## 2021-05-19 DIAGNOSIS — I340 Nonrheumatic mitral (valve) insufficiency: Secondary | ICD-10-CM | POA: Insufficient documentation

## 2021-05-19 DIAGNOSIS — Z86718 Personal history of other venous thrombosis and embolism: Secondary | ICD-10-CM | POA: Insufficient documentation

## 2021-05-19 DIAGNOSIS — I130 Hypertensive heart and chronic kidney disease with heart failure and stage 1 through stage 4 chronic kidney disease, or unspecified chronic kidney disease: Secondary | ICD-10-CM | POA: Diagnosis not present

## 2021-05-19 DIAGNOSIS — J9691 Respiratory failure, unspecified with hypoxia: Secondary | ICD-10-CM | POA: Insufficient documentation

## 2021-05-19 DIAGNOSIS — M545 Low back pain, unspecified: Secondary | ICD-10-CM | POA: Diagnosis not present

## 2021-05-19 DIAGNOSIS — Z7984 Long term (current) use of oral hypoglycemic drugs: Secondary | ICD-10-CM | POA: Diagnosis not present

## 2021-05-19 DIAGNOSIS — J810 Acute pulmonary edema: Secondary | ICD-10-CM | POA: Insufficient documentation

## 2021-05-19 DIAGNOSIS — Z79899 Other long term (current) drug therapy: Secondary | ICD-10-CM | POA: Diagnosis not present

## 2021-05-19 DIAGNOSIS — Z09 Encounter for follow-up examination after completed treatment for conditions other than malignant neoplasm: Secondary | ICD-10-CM | POA: Insufficient documentation

## 2021-05-19 DIAGNOSIS — I428 Other cardiomyopathies: Secondary | ICD-10-CM | POA: Diagnosis not present

## 2021-05-19 DIAGNOSIS — N182 Chronic kidney disease, stage 2 (mild): Secondary | ICD-10-CM | POA: Insufficient documentation

## 2021-05-19 DIAGNOSIS — I2510 Atherosclerotic heart disease of native coronary artery without angina pectoris: Secondary | ICD-10-CM | POA: Diagnosis not present

## 2021-05-19 MED ORDER — EMPAGLIFLOZIN 10 MG PO TABS: 10.0000 mg | ORAL_TABLET | Freq: Every day | ORAL | 2 refills | Status: AC

## 2021-05-19 MED ORDER — DIGOXIN 125 MCG PO TABS: 0.1250 mg | ORAL_TABLET | Freq: Every day | ORAL | 2 refills | Status: DC

## 2021-05-19 MED ORDER — AMIODARONE HCL 200 MG PO TABS: 200.0000 mg | ORAL_TABLET | Freq: Every day | ORAL | 2 refills | Status: DC

## 2021-05-19 MED ORDER — SACUBITRIL-VALSARTAN 97-103 MG PO TABS: 1.0000 | ORAL_TABLET | Freq: Two times a day (BID) | ORAL | 2 refills | Status: DC

## 2021-05-19 MED ORDER — SPIRONOLACTONE 25 MG PO TABS: 25.0000 mg | ORAL_TABLET | Freq: Every day | ORAL | 2 refills | Status: DC

## 2021-05-19 NOTE — Patient Instructions (Signed)
It was a pleasure seeing you today!  MEDICATIONS: -We are changing your medications today --Increase Entresto to 97/103 mg (1 tablet) twice daily. You may take 2 tablets of the 49/51 mg strength twice daily until you receive the new strength.  -Call if you have questions about your medications.   NEXT APPOINTMENT: Return to clinic in 2 weeks with APP [facility] .  In general, to take care of your heart failure: -Limit your fluid intake to 2 Liters (half-gallon) per day.   -Limit your salt intake to ideally 2-3 grams (2000-3000 mg) per day. -Weigh yourself daily and record, and bring that "weight diary" to your next appointment.  (Weight gain of 2-3 pounds in 1 day typically means fluid weight.) -The medications for your heart are to help your heart and help you live longer.   -Please contact us before stopping any of your heart medications.  Call the clinic at [phone] with questions or to reschedule future appointments.

## 2021-06-03 ENCOUNTER — Ambulatory Visit (HOSPITAL_COMMUNITY)
Admission: RE | Admit: 2021-06-03 | Discharge: 2021-06-03 | Disposition: A | Discharge status: Home / Self Care | Payer: Medicare HMO | Source: Ambulatory Visit | Attending: Family Medicine | Admitting: Family Medicine

## 2021-06-03 ENCOUNTER — Encounter (HOSPITAL_COMMUNITY): Payer: Self-pay

## 2021-06-03 ENCOUNTER — Other Ambulatory Visit: Payer: Self-pay

## 2021-06-03 VITALS — BP 142/74 | HR 80 | Wt 272.6 lb

## 2021-06-03 DIAGNOSIS — R5381 Other malaise: Secondary | ICD-10-CM

## 2021-06-03 DIAGNOSIS — C787 Secondary malignant neoplasm of liver and intrahepatic bile duct: Secondary | ICD-10-CM | POA: Diagnosis not present

## 2021-06-03 DIAGNOSIS — Z87891 Personal history of nicotine dependence: Secondary | ICD-10-CM | POA: Insufficient documentation

## 2021-06-03 DIAGNOSIS — E785 Hyperlipidemia, unspecified: Secondary | ICD-10-CM | POA: Insufficient documentation

## 2021-06-03 DIAGNOSIS — N182 Chronic kidney disease, stage 2 (mild): Secondary | ICD-10-CM | POA: Insufficient documentation

## 2021-06-03 DIAGNOSIS — Z7984 Long term (current) use of oral hypoglycemic drugs: Secondary | ICD-10-CM | POA: Diagnosis not present

## 2021-06-03 DIAGNOSIS — I428 Other cardiomyopathies: Secondary | ICD-10-CM | POA: Diagnosis not present

## 2021-06-03 DIAGNOSIS — Z7901 Long term (current) use of anticoagulants: Secondary | ICD-10-CM | POA: Diagnosis not present

## 2021-06-03 DIAGNOSIS — E1122 Type 2 diabetes mellitus with diabetic chronic kidney disease: Secondary | ICD-10-CM | POA: Diagnosis not present

## 2021-06-03 DIAGNOSIS — I340 Nonrheumatic mitral (valve) insufficiency: Secondary | ICD-10-CM | POA: Diagnosis not present

## 2021-06-03 DIAGNOSIS — M545 Low back pain, unspecified: Secondary | ICD-10-CM | POA: Insufficient documentation

## 2021-06-03 DIAGNOSIS — I5022 Chronic systolic (congestive) heart failure: Secondary | ICD-10-CM | POA: Diagnosis not present

## 2021-06-03 DIAGNOSIS — Z8505 Personal history of malignant neoplasm of liver: Secondary | ICD-10-CM | POA: Insufficient documentation

## 2021-06-03 DIAGNOSIS — G8929 Other chronic pain: Secondary | ICD-10-CM | POA: Insufficient documentation

## 2021-06-03 DIAGNOSIS — I130 Hypertensive heart and chronic kidney disease with heart failure and stage 1 through stage 4 chronic kidney disease, or unspecified chronic kidney disease: Secondary | ICD-10-CM | POA: Insufficient documentation

## 2021-06-03 DIAGNOSIS — I471 Supraventricular tachycardia: Secondary | ICD-10-CM

## 2021-06-03 LAB — BASIC METABOLIC PANEL
Anion gap: 8 (ref 5–15)
BUN: 13 mg/dL (ref 8–23)
CO2: 27 mmol/L (ref 22–32)
Calcium: 9.3 mg/dL (ref 8.9–10.3)
Chloride: 105 mmol/L (ref 98–111)
Creatinine, Ser: 1.05 mg/dL (ref 0.61–1.24)
GFR, Estimated: >60 mL/min (ref >60)
Glucose, Bld: 80 mg/dL (ref 70–99)
Potassium: 4.2 mmol/L (ref 3.5–5.1)
Sodium: 140 mmol/L (ref 135–145)

## 2021-06-03 LAB — DIGOXIN LEVEL: Digoxin Level: 0.4 ng/mL — ABNORMAL LOW (ref 0.8–2.0)

## 2021-06-03 MED ORDER — CARVEDILOL 3.125 MG PO TABS: 3.1250 mg | ORAL_TABLET | Freq: Two times a day (BID) | ORAL | 11 refills | Status: DC

## 2021-06-03 NOTE — Patient Instructions (Signed)
START Coreg 3.125 mg, one tab twice a day  Labs today We will only contact you if something comes back abnormal or we need to make some changes. Otherwise no news is good news!  Your physician has requested that you regularly monitor and record your blood pressure readings at home. Please use the same machine at the same time of day to check your readings and record them to bring to your follow-up visit. -see order for cuff  Keep cardiology followup as scheduled  Do the following things EVERYDAY: Weigh yourself in the morning before breakfast. Write it down and keep it in a log. Take your medicines as prescribed Eat low salt foods--Limit salt (sodium) to 2000 mg per day.  Stay as active as you can everyday Limit all fluids for the day to less than 2 liters  At the [facility], you and your health needs are our priority. As part of our continuing mission to provide you with exceptional heart care, we have created designated Provider Care Teams. These Care Teams include your primary Cardiologist (physician) and Advanced Practice Providers (APPs- Physician Assistants and Nurse Practitioners) who all work together to provide you with the care you need, when you need it.   You may see any of the following providers on your designated Care Team at your next follow up: Dr Glori Bickers Dr Haynes Kerns, NP Lyda Jester, [place] [facility] [place], [place] Audry Riles, PharmD   Please be sure to bring in all your medications bottles to every appointment.

## 2021-06-03 NOTE — Progress Notes (Signed)
ADVANCED HF CLINIC NOTE   Primary Care: Martinique, Julie M, NP HF Cardiologist: Dr. Haroldine Laws  HPI: Raymond Allen is a 61 y.o. AAM w/ remote h/o stage III colon cancer, diagnosed in October 2001, HTN, Type 2DM, HLD, former smoker, h/o LLE DVT on Xarelto, newly diagnosed systolic HF/ NICM.   He received initial adjuvant chemotherapy and underwent partial colectomy. He progressed to liver cancer in October 2002. He underwent partial hepatectomy at [facility] in February 2003. He received neoadjuvant chemotherapy with FOLFIRI then adjuvant FOLFOX following the surgery. He was put on maintenance Xeloda. He has been off all chemotherapy since September of 2003.   Admitted 12/22 with new CHF exacerbation and acute hypoxic respiratory failure, requiring intubation. Echo obtained and showed biventricular dysfunction. LVEF severely reduced 25-30% w/ global HK, RV mildly reduced , mildly elevated RVSP 38 mmH, Mod-severe MR. AHF consulted, underwent [facility] 12/22 showing minimal CAD, preserved CO, low SVR. NE re-initiated w/ concern for distributive component to his hypotension. Etiology for CM unclear, ? Cardiotoxic chemo regimen. Hospitalization c/b sepsis 2/2 PNA, atrial tachycardia with RVR, and AKI. He was extubated, GDMT titrated, amiodarone added for AT. Discharged home, weight 267 lbs.  Today he returns for HF follow up with his wife. Feels ok, SOB walking further distances on flat ground. Continues to walk with a cane for balance. Fell 2 weeks ago after right leg gave out. Denies palpitations, abnormal bleeding, CP, dizziness, edema, or PND/Orthopnea. Appetite ok. No fever or chills. Weight at home 262 lbs. Taking all medications. Wife cooks most meals, does not drink much fluids during the day. He has declined PT. Continues with chronic lower back pain, sees pain MD and receiving injections.   Cardiac Studies - Echo (12/22): biventricular dysfunction. LVEF severely reduced 25-30% w/ global HK, RV mildly  reduced , mildly elevated RVSP 38 mmH, Mod-severe MR.   - R/LHC (12/22):      Prox RCA to Mid RCA lesion is 40% stenosed.   Prox Cx to Mid Cx lesion is 50% stenosed.   Mid LAD lesion is 30% stenosed.   Findings: Ao = 61/48 (52) -> NE restarted  LV = 94/27 -> on NE RA =  8 RV = 27/8 PA = 29/12 (22) PCW = 14 Fick cardiac output/index = 6.5/2.7 (prior to NE) PVR = 1.2 WU SVR = 539 Ao sat = 95% PA sat = 66%, 64% PAPi = 2.1   Assessment: 1. Non-obstructive CAD 2. Severe NICM 3. Preserved CO (prior to NE)   Plan/Discussion:  He has severe NICM. Low SVR and need to re-initiate NE in setting of preserved CO raises concern for distributive component to his hypotension.   Past Medical History:  Diagnosis Date   Cancer [facility])    Colon cancer metastasized to liver ([place]) 01/05/2012   Colon carcinoma ([place])    colon ca dx 2001;   Diabetes mellitus without complication (HCC)    Hypertension    Muscle weakness-general 01/05/2012   Current Outpatient Medications  Medication Sig Dispense Refill   amiodarone (PACERONE) 200 MG tablet Take 1 tablet (200 mg total) by mouth daily. Take 2 pills daily for 1 week then once a day 90 tablet 2   cyclobenzaprine (FLEXERIL) 10 MG tablet Take 10 mg by mouth 3 (three) times daily as needed for muscle spasms.     digoxin (LANOXIN) 0.125 MG tablet Take 1 tablet (0.125 mg total) by mouth daily. 90 tablet 2   empagliflozin (JARDIANCE) 10 MG TABS tablet Take  1 tablet (10 mg total) by mouth daily. 90 tablet 2   gabapentin (NEURONTIN) 300 MG capsule Take 600-900 mg by mouth See admin instructions. Take 600 mg by mouth in the morning and afternoon, then 900mg  at bedtime  6   metFORMIN (GLUCOPHAGE-XR) 500 MG 24 hr tablet Take 1,000 mg by mouth 2 (two) times daily.  1   OZEMPIC, 1 MG/DOSE, 4 MG/3ML SOPN Inject 1 mg into the skin every Monday.     rosuvastatin (CRESTOR) 20 MG tablet Take 20 mg by mouth daily.     sacubitril-valsartan (ENTRESTO) 97-103 MG Take 1  tablet by mouth 2 (two) times daily. 180 tablet 2   sildenafil (REVATIO) 20 MG tablet Take 20 mg by mouth daily as needed (ED).     spironolactone (ALDACTONE) 25 MG tablet Take 1 tablet (25 mg total) by mouth daily. 90 tablet 2   TRESIBA FLEXTOUCH 200 UNIT/ML FlexTouch Pen Inject 30 Units into the skin every evening.     XARELTO 20 MG TABS tablet Take 20 mg by mouth at bedtime.     No current facility-administered medications for this encounter.   No Known Allergies  Social History   Socioeconomic History   Marital status: Single    Spouse name: Not on file   Number of children: Not on file   Years of education: Not on file   Highest education level: Not on file  Occupational History   Not on file  Tobacco Use   Smoking status: Former    Packs/day: 0.50    Years: 32.00    Pack years: 16.00    Types: Cigarettes    Quit date: 05/19/2015    Years since quitting: 6.0   Smokeless tobacco: Never  Vaping Use   Vaping Use: Never used  Substance and Sexual Activity   Alcohol use: No   Drug use: No   Sexual activity: Not on file  Other Topics Concern   Not on file  Social History Narrative   Not on file   Social Determinants of Health   Financial Resource Strain: Not on file  Food Insecurity: Not on file  Transportation Needs: Not on file  Physical Activity: Not on file  Stress: Not on file  Social Connections: Not on file  Intimate Partner Violence: Not on file    No family history on file.  BP (!) 142/74    Pulse 80    Wt 123.7 kg (272 lb 9.6 oz)    SpO2 97%    BMI 36.97 kg/m   Wt Readings from Last 3 Encounters:  06/03/21 123.7 kg (272 lb 9.6 oz)  05/19/21 121.3 kg (267 lb 6.4 oz)  04/22/21 122.5 kg (270 lb)   PHYSICAL EXAM: General:  NAD. No resp difficulty HEENT: Normal Neck: Supple. No JVD. Carotids 2+ bilat; no bruits. No lymphadenopathy or thryomegaly appreciated. Cor: PMI nondisplaced. Regular rate & rhythm. No rubs, gallops or murmurs. Lungs:  Clear Abdomen: Obese, nontender, nondistended. No hepatosplenomegaly. No bruits or masses. Good bowel sounds. Extremities: No cyanosis, clubbing, rash, edema Neuro: Alert & oriented x 3, cranial nerves grossly intact. Moves all 4 extremities w/o difficulty. Affect pleasant.  ASSESSMENT & PLAN:  1. Chronic Systolic Heart Failure - Echo LVEF 25-30%, diffuse HK, RV mildly reduced. No prior study for comparison - Etiology uncertain. H/o chemotherapy w/ Fluoropyrimidines (potentially cardiotoxic) but this was remotely. Off chemo since 2003 - R/LHC 12/22 minimal CAD. Preserved CO. Low SVR - cMRI (12/22): LVEF 34%, RVEF 42%,  mild MR, significantly dilated pulm artery 40 mm, AAA 45 mm - NYHA II, functional class confounded by physical deconditioning and chronic pain. Volume looks good today.  - Start carvedilol 3.125 mg bid. - Continue Entresto 97/103 mg bid. - Continue Jardiance 10 mg daily. - Continue spiro 25 mg daily. - Continue digoxin 0.125 mg daily. - I asked him to weigh daily, limit salt intake < 2 g/day and fluids < 2 L/day. - BMET and dig level today. - Repeat echo next visit.   2. Mitral Regurgitation  - Mod-severe on echo. No significant v-waves in PCWP tracing on cath. - Suspect functional in setting of severely dilated LV.  - Doubt this is major driver currently.  - Mild on cMRI.   3. CKD Stage II  - Baseline SCr ~1.2.  - BMET today.   4. Type 2DM  - Hgb A1c 7.2  - Continue Farxiga.   5. Atrial Tach vs AFlutter w/ RVR - Regular on exam today. - CHA2DS2-VASc Score = 4   - Continue amio 200 mg daily (recently reduced). - On Xarelto 20. No bleeding issues.  6. Physical deconditioning: - Discussed PT to help with gait and maybe chronic pain. He declined this.  7. Chronic low back pain: - Has follow up with provider managing his pain meds. - Need to get pain under control to help preserve his mobility.  Follow up next month as scheduled with Dr. Haroldine Laws + echo.    Allena Katz, FNP-BC 06/03/21

## 2021-07-15 ENCOUNTER — Ambulatory Visit (HOSPITAL_COMMUNITY)
Admission: RE | Admit: 2021-07-15 | Discharge: 2021-07-15 | Disposition: A | Discharge status: Home / Self Care | Payer: Medicare HMO | Source: Ambulatory Visit | Attending: Internal Medicine | Admitting: Internal Medicine

## 2021-07-15 ENCOUNTER — Ambulatory Visit (HOSPITAL_BASED_OUTPATIENT_CLINIC_OR_DEPARTMENT_OTHER)
Admission: RE | Admit: 2021-07-15 | Discharge: 2021-07-15 | Disposition: A | Discharge status: Home / Self Care | Payer: Medicare HMO | Source: Ambulatory Visit | Attending: Internal Medicine | Admitting: Internal Medicine

## 2021-07-15 ENCOUNTER — Encounter (HOSPITAL_COMMUNITY): Payer: Self-pay | Admitting: Internal Medicine

## 2021-07-15 VITALS — BP 130/80 | HR 80 | Wt 273.0 lb

## 2021-07-15 DIAGNOSIS — Z7901 Long term (current) use of anticoagulants: Secondary | ICD-10-CM | POA: Diagnosis not present

## 2021-07-15 DIAGNOSIS — Z7984 Long term (current) use of oral hypoglycemic drugs: Secondary | ICD-10-CM | POA: Diagnosis not present

## 2021-07-15 DIAGNOSIS — Z6837 Body mass index (BMI) 37.0-37.9, adult: Secondary | ICD-10-CM | POA: Insufficient documentation

## 2021-07-15 DIAGNOSIS — I2510 Atherosclerotic heart disease of native coronary artery without angina pectoris: Secondary | ICD-10-CM | POA: Insufficient documentation

## 2021-07-15 DIAGNOSIS — I7121 Aneurysm of the ascending aorta, without rupture: Secondary | ICD-10-CM | POA: Diagnosis not present

## 2021-07-15 DIAGNOSIS — E119 Type 2 diabetes mellitus without complications: Secondary | ICD-10-CM | POA: Diagnosis not present

## 2021-07-15 DIAGNOSIS — I080 Rheumatic disorders of both mitral and aortic valves: Secondary | ICD-10-CM | POA: Diagnosis not present

## 2021-07-15 DIAGNOSIS — N182 Chronic kidney disease, stage 2 (mild): Secondary | ICD-10-CM

## 2021-07-15 DIAGNOSIS — M549 Dorsalgia, unspecified: Secondary | ICD-10-CM | POA: Diagnosis not present

## 2021-07-15 DIAGNOSIS — E1122 Type 2 diabetes mellitus with diabetic chronic kidney disease: Secondary | ICD-10-CM

## 2021-07-15 DIAGNOSIS — I471 Supraventricular tachycardia: Secondary | ICD-10-CM

## 2021-07-15 DIAGNOSIS — Z87891 Personal history of nicotine dependence: Secondary | ICD-10-CM | POA: Insufficient documentation

## 2021-07-15 DIAGNOSIS — I5022 Chronic systolic (congestive) heart failure: Secondary | ICD-10-CM

## 2021-07-15 DIAGNOSIS — I428 Other cardiomyopathies: Secondary | ICD-10-CM | POA: Diagnosis not present

## 2021-07-15 DIAGNOSIS — E785 Hyperlipidemia, unspecified: Secondary | ICD-10-CM | POA: Insufficient documentation

## 2021-07-15 DIAGNOSIS — I340 Nonrheumatic mitral (valve) insufficiency: Secondary | ICD-10-CM | POA: Diagnosis not present

## 2021-07-15 DIAGNOSIS — Z79899 Other long term (current) drug therapy: Secondary | ICD-10-CM | POA: Insufficient documentation

## 2021-07-15 DIAGNOSIS — I110 Hypertensive heart disease with heart failure: Secondary | ICD-10-CM | POA: Insufficient documentation

## 2021-07-15 DIAGNOSIS — C787 Secondary malignant neoplasm of liver and intrahepatic bile duct: Secondary | ICD-10-CM | POA: Insufficient documentation

## 2021-07-15 DIAGNOSIS — I509 Heart failure, unspecified: Secondary | ICD-10-CM

## 2021-07-15 LAB — ECHOCARDIOGRAM COMPLETE
S' Lateral: 4.2 cm
Single Plane A4C EF: 31.9 %

## 2021-07-15 LAB — BASIC METABOLIC PANEL
Anion gap: 9 (ref 5–15)
BUN: 14 mg/dL (ref 8–23)
CO2: 27 mmol/L (ref 22–32)
Calcium: 9 mg/dL (ref 8.9–10.3)
Chloride: 103 mmol/L (ref 98–111)
Creatinine, Ser: 1.22 mg/dL (ref 0.61–1.24)
GFR, Estimated: >60 mL/min (ref >60)
Glucose, Bld: 91 mg/dL (ref 70–99)
Potassium: 4.8 mmol/L (ref 3.5–5.1)
Sodium: 139 mmol/L (ref 135–145)

## 2021-07-15 LAB — BRAIN NATRIURETIC PEPTIDE: B Natriuretic Peptide: 37.7 pg/mL (ref 0.0–100.0)

## 2021-07-15 MED ORDER — FUROSEMIDE 40 MG PO TABS: 40.0000 mg | ORAL_TABLET | Freq: Every day | ORAL | 6 refills | Status: DC

## 2021-07-15 MED ORDER — POTASSIUM CHLORIDE CRYS ER 20 MEQ PO TBCR: 20.0000 meq | EXTENDED_RELEASE_TABLET | Freq: Every day | ORAL | 6 refills | Status: DC

## 2021-07-15 NOTE — Progress Notes (Signed)
?  Echocardiogram ?2D Echocardiogram has been performed. ? ?Raymond Allen ?07/15/2021, 3:13 PM ?

## 2021-07-15 NOTE — Progress Notes (Signed)
ReDS Vest / Clip - 07/15/21 1600   ? ?  ? ReDS Vest / Clip  ? Station Marker D   ? Ruler Value 40.5   ? ReDS Value Range Low volume   ? ReDS Actual Value 29   ? ?  ?  ? ?  ? ? ?

## 2021-07-15 NOTE — Patient Instructions (Signed)
Start Furosemide 40 mg Daily ? ?Start Potassium 20 meq Daily ? ?Labs done today, your results will be available in MyChart, we will contact you for abnormal readings. ? ?Your physician recommends that you schedule a follow-up appointment in: 2-3 months ? ?If you have any questions or concerns before your next appointment please send [place] a message through [place] or call our office at [phone].   ? ?TO LEAVE A MESSAGE FOR THE NURSE SELECT OPTION 2, PLEASE LEAVE A MESSAGE INCLUDING: ?YOUR NAME ?DATE OF BIRTH ?CALL BACK NUMBER ?REASON FOR CALL**this is important as we prioritize the call backs ? ?YOU WILL RECEIVE A CALL BACK THE SAME DAY AS LONG AS YOU CALL BEFORE 4:00 PM ? ?At the [facility], you and your health needs are our priority. As part of our continuing mission to provide you with exceptional heart care, we have created designated Provider Care Teams. These Care Teams include your primary Cardiologist (physician) and Advanced Practice Providers (APPs- Physician Assistants and Nurse Practitioners) who all work together to provide you with the care you need, when you need it.  ? ?You may see any of the following providers on your designated Care Team at your next follow up: ?Dr Glori Bickers ?Dr Loralie Champagne ?Darrick Grinder, NP ?Lyda Jester, PA ?Jessica Milford,NP ?Marlyce Huge, PA ?Audry Riles, PharmD ? ? ?Please be sure to bring in all your medications bottles to every appointment.  ? ? ?

## 2021-07-15 NOTE — Progress Notes (Signed)
? ?ADVANCED HF CLINIC NOTE ? ? ?Primary Care: Martinique, Julie M, NP ?HF Cardiologist: Dr. Haroldine Laws ? ?HPI: ? ?Raymond Allen is a 61 y.o. AAM w/ remote h/o stage III colon cancer, diagnosed in October 2001, HTN, Type 2DM, HLD, former smoker, h/o LLE DVT on Xarelto and systolic HF/ NICM.  ? ?He received initial adjuvant chemotherapy and underwent partial colectomy. He had mets to liver in October 2002. He underwent partial hepatectomy at [facility] in February 2003. He received neoadjuvant chemotherapy with FOLFIRI then adjuvant FOLFOX following the surgery. He was put on maintenance Xeloda. He has been off all chemotherapy since September of 2003. ?  ?Admitted 12/22 with new CHF and acute hypoxic respiratory failure, requiring intubation. Echo LVEF 25-30% w/ global HK, RV mildly reduced Mod-severe MR. R/LHC 12/22  minimal CAD. Etiology for CM unclear. Hospitalization c/b sepsis 2/2 PNA, atrial tachycardia with RVR, and AKI ? ?Today he returns for f/u with his wife. Main complaint is back pain. Says he gets SOB with mild exertion. Feels like he has fluid in his legs. No orthopnea or PND. Drinks about a gallon of fluid a day.  ? ?Echo today 07/15/21 EF 30-35% mild MR Personally reviewed ? ?Cardiac Studies ?- Echo (12/22): biventricular dysfunction. LVEF severely reduced 25-30% w/ global HK, RV mildly reduced , mildly elevated RVSP 38 mmH, Mod-severe MR.  ? ?- R/LHC (12/22):  ?    Prox RCA to Mid RCA lesion is 40% stenosed. ?  Prox Cx to Mid Cx lesion is 50% stenosed. ?  Mid LAD lesion is 30% stenosed. ?  ?Findings: ?Ao = 61/48 (52) -> NE restarted  ?LV = 94/27 -> on NE ?RA =  8 ?RV = 27/8 ?PA = 29/12 (22) ?PCW = 14 ?Fick cardiac output/index = 6.5/2.7 (prior to NE) ?PVR = 1.2 WU ?SVR = 539 ?Ao sat = 95% ?PA sat = 66%, 64% ?PAPi = 2.1 ? ? ?Past Medical History:  ?Diagnosis Date  ? Cancer [facility])   ? Colon cancer metastasized to liver ([place]) 01/05/2012  ? Colon carcinoma ([place])   ? colon ca dx 2001;  ? Diabetes mellitus without  complication ([place])   ? Hypertension   ? Muscle weakness-general 01/05/2012  ? ?Current Outpatient Medications  ?Medication Sig Dispense Refill  ? amiodarone (PACERONE) 200 MG tablet Take 200 mg by mouth daily.    ? carvedilol (COREG) 3.125 MG tablet Take 1 tablet (3.125 mg total) by mouth 2 (two) times daily. 60 tablet 11  ? cyclobenzaprine (FLEXERIL) 10 MG tablet Take 10 mg by mouth 3 (three) times daily as needed for muscle spasms.    ? digoxin (LANOXIN) 0.125 MG tablet Take 1 tablet (0.125 mg total) by mouth daily. 90 tablet 2  ? empagliflozin (JARDIANCE) 10 MG TABS tablet Take 1 tablet (10 mg total) by mouth daily. 90 tablet 2  ? gabapentin (NEURONTIN) 300 MG capsule Take 600-900 mg by mouth See admin instructions. Take 600 mg by mouth in the morning and afternoon, then '900mg'$  at bedtime  6  ? metFORMIN (GLUCOPHAGE-XR) 500 MG 24 hr tablet Take 1,000 mg by mouth 2 (two) times daily.  1  ? OZEMPIC, 1 MG/DOSE, 4 MG/3ML SOPN Inject 1 mg into the skin every Monday.    ? rosuvastatin (CRESTOR) 20 MG tablet Take 20 mg by mouth daily.    ? sacubitril-valsartan (ENTRESTO) 97-103 MG Take 1 tablet by mouth 2 (two) times daily. 180 tablet 2  ? sildenafil (REVATIO) 20 MG tablet Take 20  mg by mouth daily as needed (ED).    ? spironolactone (ALDACTONE) 25 MG tablet Take 1 tablet (25 mg total) by mouth daily. 90 tablet 2  ? TRESIBA FLEXTOUCH 200 UNIT/ML FlexTouch Pen Inject 30 Units into the skin every evening.    ? XARELTO 20 MG TABS tablet Take 20 mg by mouth at bedtime.    ? ?No current facility-administered medications for this encounter.  ? ?No Known Allergies ? ?Social History  ? ?Socioeconomic History  ? Marital status: Single  ?  Spouse name: Not on file  ? Number of children: Not on file  ? Years of education: Not on file  ? Highest education level: Not on file  ?Occupational History  ? Not on file  ?Tobacco Use  ? Smoking status: Former  ?  Packs/day: 0.50  ?  Years: 32.00  ?  Pack years: 16.00  ?  Types: Cigarettes  ?   Quit date: 05/19/2015  ?  Years since quitting: 6.1  ? Smokeless tobacco: Never  ?Vaping Use  ? Vaping Use: Never used  ?Substance and Sexual Activity  ? Alcohol use: No  ? Drug use: No  ? Sexual activity: Not on file  ?Other Topics Concern  ? Not on file  ?Social History Narrative  ? Not on file  ? ?Social Determinants of Health  ? ?Financial Resource Strain: Not on file  ?Food Insecurity: Not on file  ?Transportation Needs: Not on file  ?Physical Activity: Not on file  ?Stress: Not on file  ?Social Connections: Not on file  ?Intimate Partner Violence: Not on file  ? ? History reviewed. No pertinent family history. ? ?BP 130/80   Pulse 80   Wt 123.8 kg (273 lb)   SpO2 97%   BMI 37.03 kg/m?  ? ?Wt Readings from Last 3 Encounters:  ?07/15/21 123.8 kg (273 lb)  ?06/03/21 123.7 kg (272 lb 9.6 oz)  ?05/19/21 121.3 kg (267 lb 6.4 oz)  ? ?PHYSICAL EXAM: ?General:  Walked slowly into clinic No resp difficulty ?HEENT: normal ?Neck: supple. JVP 7-8. Carotids 2+ bilat; no bruits. No lymphadenopathy or thryomegaly appreciated. ?Cor: PMI nondisplaced. Regular rate & rhythm. No rubs, gallops or murmurs. ?Lungs: clear ?Abdomen: obese soft, nontender, nondistended. No hepatosplenomegaly. No bruits or masses. Good bowel sounds. ?Extremities: no cyanosis, clubbing, rash, 1+ edema ?Neuro: alert & orientedx3, cranial nerves grossly intact. moves all 4 extremities w/o difficulty. Affect pleasant ? ? ?ASSESSMENT & PLAN:  ? ?1. Chronic Systolic Heart Failure ?- Echo 12/22 LVEF 25-30%, diffuse HK, RV mildly reduced. No prior study for comparison ?- Etiology uncertain. H/o chemotherapy w/ Fluoropyrimidines (potentially cardiotoxic) but this was remotely. Off chemo since 2003 ?- [facility] 12/22 minimal CAD. Preserved CO. Low SVR ?- cMRI (12/22): LVEF 34%, RVEF 42%, mild MR, significantly dilated pulm artery 40 mm, AAA 45 mm ?- Echo today 07/15/21 EF 30-35% mild MR Personally reviewed ?- NYHA III, functional class confounded by physical  deconditioning and chronic pain. ?- Volume status elevated. (Though REDS only 29%) Add lasix 40 daily and K 20 daily. Needs to cut back fluid intake ?- Continue carvedilol 3.125 mg bid. ?- Continue Entresto 97/103 mg bid. ?- Continue Jardiance 10 mg daily. ?- Continue spiro 25 mg daily. ?- Continue digoxin 0.125 mg daily. ?- EF improving slowly - will hold off on ICD now as EF improving slowly.  ?- Labs today ?  ?2. Mitral Regurgitation  ?- Mod-severe on echo 12/22. No significant v-waves in PCWP tracing on  cath. ?- Suspect functional in setting of severely dilated LV.  ?- Mild on cMRI. Mild on echo today 07/15/21 ? ?3. Ascending aortic aneurysm ?- 4.5 cm on MRI ?- On echo today 4.1 cm ?- Plan CT at next visit to further evaluate ?  ?4. Type 2DM  ?- Hgb A1c 7.2  ?- Continue Farxiga. ?  ?5. Atrial Tach vs AFlutter w/ RVR ?- Regular on exam today. ?- CHA2DS2-VASc Score = 4   ?- Continue amio 200 mg daily  ?- On Xarelto 20. No bleeding issues. ? ?6. Morbid obesity ?- needs weight loss ?- consider GLP1RA ? ?7. Back pain ?- continue to follow with pain clinic ? ?  ?Glori Bickers, MD  ?3:33 PM ?07/15/21 ? ?

## 2021-10-14 ENCOUNTER — Ambulatory Visit (HOSPITAL_COMMUNITY)
Admission: RE | Admit: 2021-10-14 | Discharge: 2021-10-14 | Disposition: A | Discharge status: Home / Self Care | Payer: Medicare Other | Source: Ambulatory Visit | Attending: Internal Medicine | Admitting: Internal Medicine

## 2021-10-14 ENCOUNTER — Encounter (HOSPITAL_COMMUNITY): Payer: Self-pay

## 2021-10-14 ENCOUNTER — Encounter (HOSPITAL_COMMUNITY): Payer: Medicare HMO | Admitting: Internal Medicine

## 2021-10-14 VITALS — BP 110/76 | HR 91 | Wt 260.0 lb

## 2021-10-14 DIAGNOSIS — G8929 Other chronic pain: Secondary | ICD-10-CM

## 2021-10-14 DIAGNOSIS — R0602 Shortness of breath: Secondary | ICD-10-CM | POA: Diagnosis not present

## 2021-10-14 DIAGNOSIS — I498 Other specified cardiac arrhythmias: Secondary | ICD-10-CM | POA: Diagnosis not present

## 2021-10-14 DIAGNOSIS — E119 Type 2 diabetes mellitus without complications: Secondary | ICD-10-CM | POA: Diagnosis not present

## 2021-10-14 DIAGNOSIS — M549 Dorsalgia, unspecified: Secondary | ICD-10-CM | POA: Diagnosis not present

## 2021-10-14 DIAGNOSIS — Z79899 Other long term (current) drug therapy: Secondary | ICD-10-CM | POA: Diagnosis not present

## 2021-10-14 DIAGNOSIS — Z7901 Long term (current) use of anticoagulants: Secondary | ICD-10-CM | POA: Insufficient documentation

## 2021-10-14 DIAGNOSIS — I110 Hypertensive heart disease with heart failure: Secondary | ICD-10-CM | POA: Diagnosis present

## 2021-10-14 DIAGNOSIS — M545 Low back pain, unspecified: Secondary | ICD-10-CM

## 2021-10-14 DIAGNOSIS — I7121 Aneurysm of the ascending aorta, without rupture: Secondary | ICD-10-CM | POA: Insufficient documentation

## 2021-10-14 DIAGNOSIS — Z86718 Personal history of other venous thrombosis and embolism: Secondary | ICD-10-CM | POA: Diagnosis not present

## 2021-10-14 DIAGNOSIS — E785 Hyperlipidemia, unspecified: Secondary | ICD-10-CM | POA: Insufficient documentation

## 2021-10-14 DIAGNOSIS — R5381 Other malaise: Secondary | ICD-10-CM | POA: Insufficient documentation

## 2021-10-14 DIAGNOSIS — Z7984 Long term (current) use of oral hypoglycemic drugs: Secondary | ICD-10-CM | POA: Insufficient documentation

## 2021-10-14 DIAGNOSIS — Z9221 Personal history of antineoplastic chemotherapy: Secondary | ICD-10-CM | POA: Insufficient documentation

## 2021-10-14 DIAGNOSIS — I5022 Chronic systolic (congestive) heart failure: Secondary | ICD-10-CM | POA: Diagnosis not present

## 2021-10-14 DIAGNOSIS — I428 Other cardiomyopathies: Secondary | ICD-10-CM | POA: Diagnosis not present

## 2021-10-14 DIAGNOSIS — R531 Weakness: Secondary | ICD-10-CM | POA: Diagnosis not present

## 2021-10-14 DIAGNOSIS — Z87891 Personal history of nicotine dependence: Secondary | ICD-10-CM | POA: Diagnosis not present

## 2021-10-14 DIAGNOSIS — Z85038 Personal history of other malignant neoplasm of large intestine: Secondary | ICD-10-CM | POA: Insufficient documentation

## 2021-10-14 DIAGNOSIS — Z6835 Body mass index (BMI) 35.0-35.9, adult: Secondary | ICD-10-CM | POA: Diagnosis not present

## 2021-10-14 DIAGNOSIS — I340 Nonrheumatic mitral (valve) insufficiency: Secondary | ICD-10-CM | POA: Insufficient documentation

## 2021-10-14 DIAGNOSIS — E1122 Type 2 diabetes mellitus with diabetic chronic kidney disease: Secondary | ICD-10-CM

## 2021-10-14 DIAGNOSIS — N182 Chronic kidney disease, stage 2 (mild): Secondary | ICD-10-CM

## 2021-10-14 LAB — COMPREHENSIVE METABOLIC PANEL
ALT: 19 U/L (ref 0–44)
AST: 18 U/L (ref 15–41)
Albumin: 3.8 g/dL (ref 3.5–5.0)
Alkaline Phosphatase: 41 U/L (ref 38–126)
Anion gap: 9 (ref 5–15)
BUN: 19 mg/dL (ref 8–23)
CO2: 25 mmol/L (ref 22–32)
Calcium: 9.8 mg/dL (ref 8.9–10.3)
Chloride: 105 mmol/L (ref 98–111)
Creatinine, Ser: 1.51 mg/dL — ABNORMAL HIGH (ref 0.61–1.24)
GFR, Estimated: 52 mL/min — ABNORMAL LOW (ref >60)
Glucose, Bld: 82 mg/dL (ref 70–99)
Potassium: 4.3 mmol/L (ref 3.5–5.1)
Sodium: 139 mmol/L (ref 135–145)
Total Bilirubin: 1.9 mg/dL — ABNORMAL HIGH (ref 0.3–1.2)
Total Protein: 7.5 g/dL (ref 6.5–8.1)

## 2021-10-14 LAB — TSH: TSH: 1.132 u[IU]/mL (ref 0.350–4.500)

## 2021-10-14 NOTE — Patient Instructions (Signed)
Thank you for coming in today  Labs were done today, if any labs are abnormal the clinic will call you No news is good news  You have been ordered a chest CT and will be contacted You have been referred to outpatient physical therapy   Your physician recommends that you schedule a follow-up appointment in:  3 months with Dr. Haroldine Laws please call in August for appointment   At the [facility], you and your health needs are our priority. As part of our continuing mission to provide you with exceptional heart care, we have created designated Provider Care Teams. These Care Teams include your primary Cardiologist (physician) and Advanced Practice Providers (APPs- Physician Assistants and Nurse Practitioners) who all work together to provide you with the care you need, when you need it.   You may see any of the following providers on your designated Care Team at your next follow up: Dr Glori Bickers Dr Haynes Kerns, NP Lyda Jester, [place] [facility] [place], [place] Audry Riles, PharmD   Please be sure to bring in all your medications bottles to every appointment.   If you have any questions or concerns before your next appointment please send [place] a message through [place] or call our office at [phone].    TO LEAVE A MESSAGE FOR THE NURSE SELECT OPTION 2, PLEASE LEAVE A MESSAGE INCLUDING: YOUR NAME DATE OF BIRTH CALL BACK NUMBER REASON FOR CALL**this is important as we prioritize the call backs  YOU WILL RECEIVE A CALL BACK THE SAME DAY AS LONG AS YOU CALL BEFORE 4:00 PM

## 2021-10-14 NOTE — Progress Notes (Signed)
ADVANCED HF CLINIC NOTE   Primary Care: Martinique, Julie M, NP HF Cardiologist: Dr. Haroldine Laws  HPI: Raymond Allen is a 61 y.o. AAM w/ remote h/o stage III colon cancer, diagnosed in October 2001, HTN, Type 2DM, HLD, former smoker, h/o LLE DVT on Xarelto and systolic HF/ NICM.   He received initial adjuvant chemotherapy and underwent partial colectomy. He had mets to liver in October 2002. He underwent partial hepatectomy at [facility] in February 2003. He received neoadjuvant chemotherapy with FOLFIRI then adjuvant FOLFOX following the surgery. He was put on maintenance Xeloda. He has been off all chemotherapy since September of 2003.   Admitted 12/22 with new CHF and acute hypoxic respiratory failure, requiring intubation. Echo LVEF 25-30% w/ global HK, RV mildly reduced Mod-severe MR. R/LHC 12/22  minimal CAD. Etiology for CM unclear. Hospitalization c/b sepsis 2/2 PNA, atrial tachycardia with RVR, and AKI.  Echo 07/15/21 EF 30-35% mild MR   Today he returns for HF follow up with his wife. Overall feeling fine. Main complaint is back pain and generalized weakness. Having trouble with balance. He is SOB walking further distances on flat ground. Generally not very active. Denies palpitations, abnormal bleeding, CP, dizziness, edema, or PND/Orthopnea. Appetite ok. No fever or chills. Weight at home 260 pounds. Taking all medications. Drinks 32 oz water/day.   Cardiac Studies - Echo (3/23): EF 30-35%, mild MR  - Echo (12/22): biventricular dysfunction. LVEF severely reduced 25-30% w/ global HK, RV mildly reduced , mildly elevated RVSP 38 mmH, Mod-severe MR.   - R/LHC (12/22):      Prox RCA to Mid RCA lesion is 40% stenosed.   Prox Cx to Mid Cx lesion is 50% stenosed.   Mid LAD lesion is 30% stenosed.   Findings: Ao = 61/48 (52) -> NE restarted  LV = 94/27 -> on NE RA =  8 RV = 27/8 PA = 29/12 (22) PCW = 14 Fick cardiac output/index = 6.5/2.7 (prior to NE) PVR = 1.2 WU SVR = 539 Ao sat  = 95% PA sat = 66%, 64% PAPi = 2.1  Past Medical History:  Diagnosis Date   Cancer ([place])    Colon cancer metastasized to liver ([place]) 01/05/2012   Colon carcinoma ([place])    colon ca dx 2001;   Diabetes mellitus without complication ([facility])    Hypertension    Muscle weakness-general 01/05/2012   Current Outpatient Medications  Medication Sig Dispense Refill   amiodarone (PACERONE) 200 MG tablet Take 200 mg by mouth daily.     carvedilol (COREG) 3.125 MG tablet Take 1 tablet (3.125 mg total) by mouth 2 (two) times daily. 60 tablet 11   cyclobenzaprine (FLEXERIL) 10 MG tablet Take 10 mg by mouth 3 (three) times daily as needed for muscle spasms.     digoxin (LANOXIN) 0.125 MG tablet Take 1 tablet (0.125 mg total) by mouth daily. 90 tablet 2   empagliflozin (JARDIANCE) 10 MG TABS tablet Take 1 tablet (10 mg total) by mouth daily. 90 tablet 2   furosemide (LASIX) 40 MG tablet Take 1 tablet (40 mg total) by mouth daily. 30 tablet 6   gabapentin (NEURONTIN) 300 MG capsule Take 600-900 mg by mouth See admin instructions. Take 600 mg by mouth in the morning and afternoon, then '900mg'$  at bedtime as needed  6   metFORMIN (GLUCOPHAGE-XR) 500 MG 24 hr tablet Take 1,000 mg by mouth 2 (two) times daily.  1   oxyCODONE-acetaminophen (PERCOCET) 7.5-325 MG tablet TAKE ONE TAB  FIVE TIMES DAILY AS NEEDED FOR PAIN     OZEMPIC, 1 MG/DOSE, 4 MG/3ML SOPN Inject 1 mg into the skin every Monday.     potassium chloride SA (KLOR-CON M) 20 MEQ tablet Take 1 tablet (20 mEq total) by mouth daily. 30 tablet 6   rosuvastatin (CRESTOR) 20 MG tablet Take 20 mg by mouth daily.     sacubitril-valsartan (ENTRESTO) 97-103 MG Take 1 tablet by mouth 2 (two) times daily. 180 tablet 2   sildenafil (REVATIO) 20 MG tablet Take 20 mg by mouth daily as needed (ED).     spironolactone (ALDACTONE) 25 MG tablet Take 1 tablet (25 mg total) by mouth daily. 90 tablet 2   TRESIBA FLEXTOUCH 200 UNIT/ML FlexTouch Pen Inject 30 Units into the skin  every evening.     XARELTO 20 MG TABS tablet Take 20 mg by mouth at bedtime.     No current facility-administered medications for this encounter.   No Known Allergies  Social History   Socioeconomic History   Marital status: Single    Spouse name: Not on file   Number of children: Not on file   Years of education: Not on file   Highest education level: Not on file  Occupational History   Not on file  Tobacco Use   Smoking status: Former    Packs/day: 0.50    Years: 32.00    Total pack years: 16.00    Types: Cigarettes    Quit date: 05/19/2015    Years since quitting: 6.4   Smokeless tobacco: Never  Vaping Use   Vaping Use: Never used  Substance and Sexual Activity   Alcohol use: No   Drug use: No   Sexual activity: Not on file  Other Topics Concern   Not on file  Social History Narrative   Not on file   Social Determinants of Health   Financial Resource Strain: Not on file  Food Insecurity: Not on file  Transportation Needs: Not on file  Physical Activity: Not on file  Stress: Not on file  Social Connections: Not on file  Intimate Partner Violence: Not on file    No family history on file.  BP 110/76   Pulse 91   Wt 117.9 kg (260 lb)   SpO2 96%   BMI 35.26 kg/m   Wt Readings from Last 3 Encounters:  10/14/21 117.9 kg (260 lb)  07/15/21 123.8 kg (273 lb)  06/03/21 123.7 kg (272 lb 9.6 oz)   PHYSICAL EXAM: General:  NAD. No resp difficulty, walked into clinic. HEENT: Normal Neck: Supple. No JVD. Carotids 2+ bilat; no bruits. No lymphadenopathy or thryomegaly appreciated. Cor: PMI nondisplaced. Regular rate & rhythm. No rubs, gallops or murmurs. Lungs: Clear Abdomen: Obese,  nontender, nondistended. No hepatosplenomegaly. No bruits or masses. Good bowel sounds. Extremities: No cyanosis, clubbing, rash, edema Neuro: Alert & oriented x 3, cranial nerves grossly intact. Moves all 4 extremities w/o difficulty. Affect pleasant.  ASSESSMENT & PLAN:  1.  Chronic Systolic Heart Failure - Echo 12/22 LVEF 25-30%, diffuse HK, RV mildly reduced. No prior study for comparison - Etiology uncertain. H/o chemotherapy w/ Fluoropyrimidines (potentially cardiotoxic) but this was remotely. Off chemo since 2003 - R/LHC 12/22 minimal CAD. Preserved CO. Low SVR - cMRI (12/22): LVEF 34%, RVEF 42%, mild MR, significantly dilated pulm artery 40 mm, AAA 45 mm - Echo (3/23): EF 30-35% mild MR  - NYHA III, functional class confounded by physical deconditioning and chronic pain. - Volume status  looks good today, weight down 13 lbs.  - Continue Lasix 40 mg daily. - Continue carvedilol 3.125 mg bid. - Continue Entresto 97/103 mg bid. - Continue Jardiance 10 mg daily. - Continue spiro 25 mg daily. - Continue digoxin 0.125 mg daily. - EF improving slowly - will hold off on ICD now as EF improving slowly.  - Labs today; will need dig trough next visit.   2. Mitral Regurgitation  - Mod-severe on echo 12/22. No significant v-waves in PCWP tracing on cath. - Suspect functional in setting of severely dilated LV.  - Mild on cMRI. Mild on echo 3/23  3. Ascending aortic aneurysm - 4.5 cm on MRI - 4.1 cm on 3/23 echo - Get CTA to further evaluate. Discussed with DB   4. Type 2DM  - Hgb A1c 7.2  - Continue Farxiga.   5. Atrial Tach vs AFlutter w/ RVR - Regular on exam today. - CHA2DS2-VASc Score = 4   - Continue amio 200 mg daily.  - On Xarelto 20. No bleeding issues. - Check TSH and LFTs today.  6. Morbid obesity - Body mass index is 35.26 kg/m. - On Ozempic. Weight down 13 lbs.  7. Back pain - Continue to follow with pain clinic - He is agreeable to outpatient PT to help with mobility/pain, will arrange.  Follow up in 3 months with Dr. Haroldine Laws.  Rafael Bihari, FNP  3:55 PM 10/14/21

## 2021-10-17 ENCOUNTER — Telehealth (HOSPITAL_COMMUNITY): Payer: Self-pay | Admitting: Surgery

## 2021-10-17 DIAGNOSIS — I5022 Chronic systolic (congestive) heart failure: Secondary | ICD-10-CM

## 2021-10-17 MED ORDER — FUROSEMIDE 40 MG PO TABS: 20.0000 mg | ORAL_TABLET | Freq: Every day | ORAL | 6 refills | Status: DC

## 2021-10-17 NOTE — Telephone Encounter (Signed)
-----   Message from Rafael Bihari, [place] sent at 10/14/2021  5:05 PM EDT ----- SCr mildly elevated. Decrease lasix to 20 mg daily. Repeat BMET in 2 weeks  T bili elevated. Please avoid Tylenol and ETOH (if he has been regularly using) he needs PCP follow up to follow elevated bili levels

## 2021-10-17 NOTE — Telephone Encounter (Signed)
Patient contacted and instructed to review results with fiancee.  I reviewed results and recommendations. I updated medication list in [facility] and scheduled repeat labwork for Monday July 17th.  Patient and fiancee are aware and agreeable.

## 2021-10-25 ENCOUNTER — Telehealth (HOSPITAL_COMMUNITY): Payer: Self-pay | Admitting: Vascular Surgery

## 2021-10-25 NOTE — Telephone Encounter (Signed)
LVM giving ct appt

## 2021-10-31 ENCOUNTER — Other Ambulatory Visit (HOSPITAL_COMMUNITY): Payer: Medicare Other

## 2021-11-02 ENCOUNTER — Ambulatory Visit: Payer: Medicare Other | Admitting: Physical Therapy

## 2021-11-02 ENCOUNTER — Ambulatory Visit (HOSPITAL_COMMUNITY): Payer: Medicare Other

## 2021-11-08 ENCOUNTER — Ambulatory Visit (HOSPITAL_COMMUNITY)
Admission: RE | Admit: 2021-11-08 | Discharge: 2021-11-08 | Disposition: A | Discharge status: Home / Self Care | Payer: Medicare Other | Source: Ambulatory Visit | Attending: Family Medicine | Admitting: Family Medicine

## 2021-11-08 DIAGNOSIS — I5022 Chronic systolic (congestive) heart failure: Secondary | ICD-10-CM | POA: Insufficient documentation

## 2021-11-08 MED ORDER — IOHEXOL 350 MG/ML SOLN
70.0000 mL | Freq: Once | INTRAVENOUS | Status: AC | PRN
  Administered 2021-11-08: 70 mL via INTRAVENOUS

## 2021-11-09 ENCOUNTER — Ambulatory Visit (HOSPITAL_COMMUNITY)
Admission: RE | Admit: 2021-11-09 | Discharge: 2021-11-09 | Disposition: A | Discharge status: Home / Self Care | Payer: Medicare Other | Source: Ambulatory Visit | Attending: Internal Medicine | Admitting: Internal Medicine

## 2021-11-09 DIAGNOSIS — I5022 Chronic systolic (congestive) heart failure: Secondary | ICD-10-CM

## 2021-11-10 ENCOUNTER — Ambulatory Visit: Payer: Medicare Other

## 2021-11-18 ENCOUNTER — Telehealth (HOSPITAL_COMMUNITY): Payer: Self-pay

## 2021-11-18 DIAGNOSIS — I7121 Aneurysm of the ascending aorta, without rupture: Secondary | ICD-10-CM

## 2021-11-18 NOTE — Telephone Encounter (Addendum)
Pt aware, agreeable, and verbalized understanding  Referral placed   ----- Message from Jerl Mina, RN sent at 11/18/2021  3:45 PM EDT -----  ----- Message ----- From: Jerl Mina, RN Sent: 11/18/2021   2:54 PM EDT To: [facility] Triage Pool   ----- Message ----- From: Interface, Rad Results In Sent: 11/08/2021  12:00 PM EDT To: Rafael Bihari, FNP   4.5 cm ascending thoracic aortic aneurysm, stable size from prior cardiac MRI.   Please refer to CVTS, will likely need repeat CT in 6 months for surveillance.

## 2021-12-27 NOTE — Patient Instructions (Addendum)
Make every effort to maintain a "heart-healthy" lifestyle with regular physical exercise and adherence to a low-fat, low-carbohydrate diet.  Continue to seek regular follow-up appointments with your primary care physician and/or cardiologist.   AVOID FLUOROQUINOLONES AS THESE CAN INCREASE YOUR RISK OF AORTIC DISSECTION (ex. Ciprofloxacin)

## 2021-12-27 NOTE — Progress Notes (Signed)
[address]       [place] [address]             Castine [id number] 06-Apr-1961  History of Present Illness:  Raymond Allen is a 61 yo male with multiple medical problems including H/O Stage III Colon cancer treated with Chemotherapy, HTN, DM Type 2, HLD, H/O LLE DVT, Atrial Flutter, CHF, NICM, Mitral Regurgitation, and known Ascending Aortic Aneurysm.  He was admitted in December of last year with CHF exacerbation with hypoxic respiratory failure requiring intubation.  Since that time he has been followed by the Advanced Heart Failure clinic and Dr. Haroldine Laws.  Since hospitalization he has been optimized on GDMT.  He has remained chronically deconditioned and has chronic back pain for which he receives chronic pain medication from a pain clinic.  He presents today for surgical evaluation of his Ascending Aortic Aneurysm.  Overall the patient states he hasn't been doing well for awhile.  He states that he isn't able to do much like he used to.  He continues to be short of breath at times.  He continues to have occasional discomfort.  He has bilateral numbness in his legs and states that his left leg is worse than his right.  He also states that over the past several weeks his LLE has become more swollen. He also states he had a fall and since that time he feels like his belly is full of fluid.  He notes that he feels this should be drained, however at [facility] they said there was no abnormality present.  He is no longer a smoker.  He states he is compliant with his medications.  Current Outpatient Medications on File Prior to Visit  Medication Sig Dispense Refill   amiodarone (PACERONE) 200 MG tablet Take 200 mg by mouth daily.     carvedilol (COREG) 3.125 MG tablet Take 1 tablet (3.125 mg total) by mouth 2 (two) times daily. 60 tablet 11   cyclobenzaprine (FLEXERIL) 10 MG tablet Take 10 mg by mouth 3 (three) times daily as needed for muscle  spasms.     digoxin (LANOXIN) 0.125 MG tablet Take 1 tablet (0.125 mg total) by mouth daily. 90 tablet 2   empagliflozin (JARDIANCE) 10 MG TABS tablet Take 1 tablet (10 mg total) by mouth daily. 90 tablet 2   furosemide (LASIX) 40 MG tablet Take 0.5 tablets (20 mg total) by mouth daily. 30 tablet 6   gabapentin (NEURONTIN) 300 MG capsule Take 600-900 mg by mouth See admin instructions. Take 600 mg by mouth in the morning and afternoon, then '900mg'$  at bedtime as needed  6   metFORMIN (GLUCOPHAGE-XR) 500 MG 24 hr tablet Take 1,000 mg by mouth 2 (two) times daily.  1   oxyCODONE-acetaminophen (PERCOCET) 7.5-325 MG tablet TAKE ONE TAB FIVE TIMES DAILY AS NEEDED FOR PAIN     OZEMPIC, 1 MG/DOSE, 4 MG/3ML SOPN Inject 1 mg into the skin every Monday.     potassium chloride SA (KLOR-CON M) 20 MEQ tablet Take 1 tablet (20 mEq total) by mouth daily. 30 tablet 6   rosuvastatin (CRESTOR) 20 MG tablet Take 20 mg by mouth daily.     sacubitril-valsartan (ENTRESTO) 97-103 MG Take 1 tablet by mouth 2 (two) times daily. 180 tablet 2   sildenafil (REVATIO) 20 MG tablet Take 20 mg by mouth daily as needed (ED).     spironolactone (ALDACTONE) 25  MG tablet Take 1 tablet (25 mg total) by mouth daily. 90 tablet 2   TRESIBA FLEXTOUCH 200 UNIT/ML FlexTouch Pen Inject 30 Units into the skin every evening.     XARELTO 20 MG TABS tablet Take 20 mg by mouth at bedtime.     No current facility-administered medications on file prior to visit.    Physical Exam  BP (!) 147/85   Pulse 89   Resp 20   Ht 6' (1.829 m)   Wt 260 lb (117.9 kg)   SpO2 96%   BMI 35.26 kg/m   Gen: NAD, morbidly obese Heart: RRR Lungs: CTA Neck: no carotid bruit Abd: soft, non-distended Ext: LLE has evidence of venous stasis changes, the left leg is more swollen than the right... bilateral edema is mild Neuro:grossly intact  CTA Results:  Cardiovascular: Ascending thoracic aortic aneurysm measuring 4.5 cm maximally. No change since prior  MRI. Heart is normal size. Scattered aortic and coronary artery calcifications. No filling defects in the pulmonary arteries to suggest pulmonary emboli.   Mediastinum/Nodes: No mediastinal, hilar, or axillary adenopathy. Trachea and esophagus are unremarkable. Thyroid unremarkable.   Lungs/Pleura: Lungs are clear. No focal airspace opacities or suspicious nodules. No effusions.   Upper Abdomen: No acute findings   Musculoskeletal: Chest wall soft tissues are unremarkable. No acute bony abnormality.   Review of the MIP images confirms the above findings.   IMPRESSION: 4.5 cm ascending thoracic aortic aneurysm. Recommend semi-annual imaging followup by CTA or MRA and referral to cardiothoracic surgery if not already obtained. This recommendation follows 2010 ACCF/AHA/AATS/ACR/ASA/SCA/SCAI/SIR/STS/SVM Guidelines for the Diagnosis and Management of Patients With Thoracic Aortic Disease. Circulation. 2010; 121: L798-X211. Aortic aneurysm NOS (ICD10-I71.9)   No acute cardiopulmonary disease.   Coronary artery disease.   Aortic Atherosclerosis (ICD10-I70.0).     Electronically Signed   By: Rolm Baptise M.D.   On: 11/08/2021 11:58  A/P:  Ascending Aortic Aneurysm- measuring 4.5 cm w/o evidence of dissection CHF/NICM- on GDMT, due for follow up AHF team this month Mitral Regurgitation HTN- elevated today at 147, he is complaint with his medications if this persist will need further titration/addition of antihypertensive agent HLD DM Atrial Flutter, H/O DVT on Xarelto LLE Swelling/venous stasis changes- patient sent for venous duplex which was + acute DVT of left peroneal and gastrocnemius veins.. patient was sent to the ED for appropriate treatment     Risk Modification:  Statin:  Yes  Smoking cessation instruction/counseling given:  commended patient for quitting and reviewed strategies for preventing relapses  Patient was counseled on importance of Blood Pressure  Control.  Despite Medical intervention if the patient notices persistently elevated blood pressure readings.  They are instructed to contact their Primary Care Physician  Please avoid use of Fluoroquinolones as this can potentially increase your risk of Aortic Rupture and/or Dissection  Patient educated on signs and symptoms of Aortic Dissection, handout also provided in AVS  Kathlyne Loud, PA-C 12/29/21

## 2021-12-29 ENCOUNTER — Encounter (HOSPITAL_COMMUNITY): Payer: Self-pay

## 2021-12-29 ENCOUNTER — Other Ambulatory Visit: Payer: Self-pay

## 2021-12-29 ENCOUNTER — Emergency Department (HOSPITAL_COMMUNITY): Payer: Medicare Other

## 2021-12-29 ENCOUNTER — Inpatient Hospital Stay (HOSPITAL_COMMUNITY)
Admission: EM | Admit: 2021-12-29 | Discharge: 2021-12-31 | DRG: 176 | Disposition: A | Discharge status: Home / Self Care | Payer: Medicare Other | Attending: Internal Medicine | Admitting: Internal Medicine

## 2021-12-29 ENCOUNTER — Institutional Professional Consult (permissible substitution) (INDEPENDENT_AMBULATORY_CARE_PROVIDER_SITE_OTHER): Payer: Medicare Other | Admitting: Physician Assistant

## 2021-12-29 ENCOUNTER — Ambulatory Visit (HOSPITAL_BASED_OUTPATIENT_CLINIC_OR_DEPARTMENT_OTHER)
Admission: RE | Admit: 2021-12-29 | Discharge: 2021-12-29 | Disposition: A | Discharge status: Home / Self Care | Payer: Medicare Other | Source: Ambulatory Visit | Attending: Cardiology | Admitting: Cardiology

## 2021-12-29 VITALS — BP 147/85 | HR 89 | Resp 20 | Ht 72.0 in | Wt 260.0 lb

## 2021-12-29 DIAGNOSIS — R2242 Localized swelling, mass and lump, left lower limb: Secondary | ICD-10-CM

## 2021-12-29 DIAGNOSIS — E782 Mixed hyperlipidemia: Secondary | ICD-10-CM | POA: Diagnosis present

## 2021-12-29 DIAGNOSIS — Z7901 Long term (current) use of anticoagulants: Secondary | ICD-10-CM

## 2021-12-29 DIAGNOSIS — Z6835 Body mass index (BMI) 35.0-35.9, adult: Secondary | ICD-10-CM

## 2021-12-29 DIAGNOSIS — Z85038 Personal history of other malignant neoplasm of large intestine: Secondary | ICD-10-CM

## 2021-12-29 DIAGNOSIS — I110 Hypertensive heart disease with heart failure: Secondary | ICD-10-CM | POA: Diagnosis present

## 2021-12-29 DIAGNOSIS — I7121 Aneurysm of the ascending aorta, without rupture: Secondary | ICD-10-CM | POA: Insufficient documentation

## 2021-12-29 DIAGNOSIS — M549 Dorsalgia, unspecified: Secondary | ICD-10-CM | POA: Diagnosis present

## 2021-12-29 DIAGNOSIS — I5042 Chronic combined systolic (congestive) and diastolic (congestive) heart failure: Secondary | ICD-10-CM | POA: Diagnosis not present

## 2021-12-29 DIAGNOSIS — E119 Type 2 diabetes mellitus without complications: Secondary | ICD-10-CM | POA: Diagnosis present

## 2021-12-29 DIAGNOSIS — Z9049 Acquired absence of other specified parts of digestive tract: Secondary | ICD-10-CM

## 2021-12-29 DIAGNOSIS — I482 Chronic atrial fibrillation, unspecified: Secondary | ICD-10-CM | POA: Diagnosis not present

## 2021-12-29 DIAGNOSIS — Z794 Long term (current) use of insulin: Secondary | ICD-10-CM

## 2021-12-29 DIAGNOSIS — I2602 Saddle embolus of pulmonary artery with acute cor pulmonale: Principal | ICD-10-CM

## 2021-12-29 DIAGNOSIS — E669 Obesity, unspecified: Secondary | ICD-10-CM | POA: Diagnosis present

## 2021-12-29 DIAGNOSIS — I2699 Other pulmonary embolism without acute cor pulmonale: Secondary | ICD-10-CM | POA: Diagnosis not present

## 2021-12-29 DIAGNOSIS — I4892 Unspecified atrial flutter: Secondary | ICD-10-CM | POA: Diagnosis present

## 2021-12-29 DIAGNOSIS — G8929 Other chronic pain: Secondary | ICD-10-CM | POA: Diagnosis present

## 2021-12-29 DIAGNOSIS — Z7984 Long term (current) use of oral hypoglycemic drugs: Secondary | ICD-10-CM

## 2021-12-29 DIAGNOSIS — Z86718 Personal history of other venous thrombosis and embolism: Secondary | ICD-10-CM

## 2021-12-29 DIAGNOSIS — Z87891 Personal history of nicotine dependence: Secondary | ICD-10-CM

## 2021-12-29 DIAGNOSIS — Z79899 Other long term (current) drug therapy: Secondary | ICD-10-CM

## 2021-12-29 LAB — BASIC METABOLIC PANEL
Anion gap: 9 (ref 5–15)
BUN: 10 mg/dL (ref 8–23)
CO2: 24 mmol/L (ref 22–32)
Calcium: 9.1 mg/dL (ref 8.9–10.3)
Chloride: 107 mmol/L (ref 98–111)
Creatinine, Ser: 1.21 mg/dL (ref 0.61–1.24)
GFR, Estimated: >60 mL/min (ref >60)
Glucose, Bld: 84 mg/dL (ref 70–99)
Potassium: 3.8 mmol/L (ref 3.5–5.1)
Sodium: 140 mmol/L (ref 135–145)

## 2021-12-29 LAB — CBC WITH DIFFERENTIAL/PLATELET
Abs Immature Granulocytes: 0.06 10*3/uL (ref 0.00–0.07)
Basophils Absolute: 0.1 10*3/uL (ref 0.0–0.1)
Basophils Relative: 1 %
Eosinophils Absolute: 0 10*3/uL (ref 0.0–0.5)
Eosinophils Relative: 0 %
HCT: 48.5 % (ref 39.0–52.0)
Hemoglobin: 14.9 g/dL (ref 13.0–17.0)
Immature Granulocytes: 1 %
Lymphocytes Relative: 29 %
Lymphs Abs: 2.2 10*3/uL (ref 0.7–4.0)
MCH: 25.9 pg — ABNORMAL LOW (ref 26.0–34.0)
MCHC: 30.7 g/dL (ref 30.0–36.0)
MCV: 84.2 fL (ref 80.0–100.0)
Monocytes Absolute: 0.6 10*3/uL (ref 0.1–1.0)
Monocytes Relative: 8 %
Neutro Abs: 4.6 10*3/uL (ref 1.7–7.7)
Neutrophils Relative %: 61 %
Platelets: 215 10*3/uL (ref 150–400)
RBC: 5.76 MIL/uL (ref 4.22–5.81)
RDW: 14.7 % (ref 11.5–15.5)
WBC: 7.6 10*3/uL (ref 4.0–10.5)
nRBC: 0 % (ref 0.0–0.2)

## 2021-12-29 LAB — BRAIN NATRIURETIC PEPTIDE: B Natriuretic Peptide: 25.6 pg/mL (ref 0.0–100.0)

## 2021-12-29 LAB — TROPONIN I (HIGH SENSITIVITY)
Troponin I (High Sensitivity): 10 ng/L (ref <18)
Troponin I (High Sensitivity): 12 ng/L (ref <18)

## 2021-12-29 MED ORDER — IOHEXOL 350 MG/ML SOLN
80.0000 mL | Freq: Once | INTRAVENOUS | Status: AC | PRN
  Administered 2021-12-29: 80 mL via INTRAVENOUS

## 2021-12-29 MED ORDER — HEPARIN BOLUS VIA INFUSION
5000.0000 [IU] | Freq: Once | INTRAVENOUS | Status: AC
  Administered 2021-12-29: 5000 [IU] via INTRAVENOUS
  Filled 2021-12-29: qty 5000

## 2021-12-29 MED ORDER — HEPARIN (PORCINE) 25000 UT/250ML-% IV SOLN
2100.0000 [IU]/h | INTRAVENOUS | Status: AC
  Administered 2021-12-29: 1750 [IU]/h via INTRAVENOUS
  Administered 2021-12-30: 1850 [IU]/h via INTRAVENOUS
  Administered 2021-12-30 – 2021-12-31 (×3): 2100 [IU]/h via INTRAVENOUS
  Filled 2021-12-29 (×5): qty 250

## 2021-12-29 NOTE — ED Provider Triage Note (Signed)
Emergency Medicine Provider Triage Evaluation Note  Raymond Allen , a 61 y.o. male  was evaluated in triage.  Pt complains of left lower extremity blood clot.  Seen by ultrasound, he is been having pain to that leg for the last 3 weeks.  Sent to ED for further work-up.  Patient states he is also having chest pain and shortness of breath although this is been going on for 3 years.  Patient is on Xarelto, denies any missed doses.  Review of Systems  Per HPI  Physical Exam  BP (!) 137/92 (BP Location: Right Arm)   Pulse 87   Temp 98.3 F (36.8 C) (Oral)   Resp (!) 22   Ht 6' (1.829 m)   Wt 117.9 kg   SpO2 94%   BMI 35.26 kg/m  Gen:   Awake, no distress   Resp:  Normal effort  MSK:   Moves extremities without difficulty  Other:  +murmur.   Medical Decision Making  Medically screening exam initiated at 4:52 PM.  Appropriate orders placed.  HARVEER SADLER was informed that the remainder of the evaluation will be completed by another provider, this initial triage assessment does not replace that evaluation, and the importance of remaining in the ED until their evaluation is complete.     Sherrill Raring, PA-C 12/29/21 1652

## 2021-12-29 NOTE — ED Provider Notes (Signed)
[facility] EMERGENCY DEPARTMENT Provider Note   CSN: [id number] Arrival date & time: 12/29/21  1628     History  Chief Complaint  Patient presents with   Shortness of Breath   Leg Pain    L    Raymond Allen is a 61 y.o. male.   Shortness of Breath Leg Pain    Patient presented to the ED for evaluation of left leg pain.  Patient's symptoms ongoing for 3 weeks.  Patient has noticed some redness and swelling.  He does take Xarelto but does not think he missed any doses.  Patient saw Dr. In the office today.  He is not sure exactly who we saw.  He had an outpatient Doppler study ordered.  It was positive for DVT.  Patient had mentioned that he been also having some shortness of breath.  It has been ongoing for years but he did feel like it is gotten worse in the last couple weeks.  He was sent to the ED for further evaluation.  He denies any fevers or chills.  No abdominal pain.  Home Medications Prior to Admission medications   Medication Sig Start Date End Date Taking? Authorizing Provider  amiodarone (PACERONE) 200 MG tablet Take 200 mg by mouth daily.    [provider]  carvedilol (COREG) 3.125 MG tablet Take 1 tablet (3.125 mg total) by mouth 2 (two) times daily. 06/03/21 06/03/22  Rafael Bihari, FNP  cyclobenzaprine (FLEXERIL) 10 MG tablet Take 10 mg by mouth 3 (three) times daily as needed for muscle spasms. 08/12/13   [provider]  digoxin (LANOXIN) 0.125 MG tablet Take 1 tablet (0.125 mg total) by mouth daily. 05/19/21   Bensimhon, Shaune Pascal, MD  empagliflozin (JARDIANCE) 10 MG TABS tablet Take 1 tablet (10 mg total) by mouth daily. 05/19/21   Bensimhon, Shaune Pascal, MD  furosemide (LASIX) 40 MG tablet Take 0.5 tablets (20 mg total) by mouth daily. 10/17/21   [place], Maricela Bo, FNP  gabapentin (NEURONTIN) 300 MG capsule Take 600-900 mg by mouth See admin instructions. Take 600 mg by mouth in the morning and afternoon, then '900mg'$  at bedtime  as needed 10/23/17   [provider]  metFORMIN (GLUCOPHAGE-XR) 500 MG 24 hr tablet Take 1,000 mg by mouth 2 (two) times daily. 10/16/17   [provider]  oxyCODONE-acetaminophen (PERCOCET) 7.5-325 MG tablet TAKE ONE TAB FIVE TIMES DAILY AS NEEDED FOR PAIN 05/26/21   [provider]  OZEMPIC, 1 MG/DOSE, 4 MG/3ML SOPN Inject 1 mg into the skin every Monday. 02/04/21   [provider]  potassium chloride SA (KLOR-CON M) 20 MEQ tablet Take 1 tablet (20 mEq total) by mouth daily. 07/15/21   Bensimhon, Shaune Pascal, MD  rosuvastatin (CRESTOR) 20 MG tablet Take 20 mg by mouth daily. 03/10/21   [provider]  sacubitril-valsartan (ENTRESTO) 97-103 MG Take 1 tablet by mouth 2 (two) times daily. 05/19/21   Bensimhon, Shaune Pascal, MD  sildenafil (REVATIO) 20 MG tablet Take 20 mg by mouth daily as needed (ED).    [provider]  spironolactone (ALDACTONE) 25 MG tablet Take 1 tablet (25 mg total) by mouth daily. 05/19/21   Bensimhon, Shaune Pascal, MD  TRESIBA FLEXTOUCH 200 UNIT/ML FlexTouch Pen Inject 30 Units into the skin every evening. 03/22/21   [provider]  XARELTO 20 MG TABS tablet Take 20 mg by mouth at bedtime. 03/05/21   [provider]      Allergies  Patient has no known allergies.    Review of Systems   Review of Systems  Respiratory:  Positive for shortness of breath.     Physical Exam Updated Vital Signs BP (!) 146/105   Pulse 81   Temp 98.3 F (36.8 C) (Oral)   Resp 15   Ht 1.829 m (6')   Wt 117.9 kg   SpO2 97%   BMI 35.26 kg/m  Physical Exam Vitals and nursing note reviewed.  Constitutional:      General: He is not in acute distress.    Appearance: He is well-developed.  HENT:     Head: Normocephalic and atraumatic.     Right Ear: External ear normal.     Left Ear: External ear normal.  Eyes:     General: No scleral icterus.       Right eye: No discharge.        Left eye: No discharge.     Conjunctiva/sclera:  Conjunctivae normal.  Neck:     Trachea: No tracheal deviation.  Cardiovascular:     Rate and Rhythm: Normal rate and regular rhythm.  Pulmonary:     Effort: Pulmonary effort is normal. No respiratory distress.     Breath sounds: Normal breath sounds. No stridor. No wheezing or rales.  Abdominal:     General: Bowel sounds are normal. There is no distension.     Palpations: Abdomen is soft.     Tenderness: There is no abdominal tenderness. There is no guarding or rebound.  Musculoskeletal:        General: No deformity.     Cervical back: Neck supple.     Left lower leg: Tenderness present. Edema present.     Comments: Erythema noted left calf  Skin:    General: Skin is warm and dry.     Findings: No rash.  Neurological:     General: No focal deficit present.     Mental Status: He is alert.     Cranial Nerves: No cranial nerve deficit (no facial droop, extraocular movements intact, no slurred speech).     Sensory: No sensory deficit.     Motor: No abnormal muscle tone or seizure activity.     Coordination: Coordination normal.  Psychiatric:        Mood and Affect: Mood normal.     ED Results / Procedures / Treatments   Labs (all labs ordered are listed, but only abnormal results are displayed) Labs Reviewed  CBC WITH DIFFERENTIAL/PLATELET - Abnormal; Notable for the following components:      Result Value   MCH 25.9 (*)    All other components within normal limits  BASIC METABOLIC PANEL  BRAIN NATRIURETIC PEPTIDE  HEPARIN LEVEL (UNFRACTIONATED)  APTT  TROPONIN I (HIGH SENSITIVITY)  TROPONIN I (HIGH SENSITIVITY)    EKG EKG Interpretation  Date/Time:  Thursday December 29 2021 16:36:50 EDT Ventricular Rate:  91 PR Interval:  164 QRS Duration: 94 QT Interval:  358 QTC Calculation: 440 R Axis:   10 Text Interpretation: Normal sinus rhythm Minimal voltage criteria for LVH, may be normal variant ( R in aVL ) ST & T wave abnormality, consider lateral ischemia  Abnormal ECG When compared with ECG of 22-Apr-2021 14:49, No significant changes noted Confirmed by Dorie Rank [phone]) on 12/29/2021 8:41:31 PM  Radiology CT Angio Chest PE W/Cm &/Or Wo Cm  Result Date: 12/29/2021 CLINICAL DATA:  Pulmonary embolism (PE) suspected, high prob.  DVT. EXAM: CT ANGIOGRAPHY CHEST WITH CONTRAST TECHNIQUE:  Multidetector CT imaging of the chest was performed using the standard protocol during bolus administration of intravenous contrast. Multiplanar CT image reconstructions and MIPs were obtained to evaluate the vascular anatomy. RADIATION DOSE REDUCTION: This exam was performed according to the departmental dose-optimization program which includes automated exposure control, adjustment of the mA and/or kV according to patient size and/or use of iterative reconstruction technique. CONTRAST:  69m OMNIPAQUE IOHEXOL 350 MG/ML SOLN COMPARISON:  11/08/2021 FINDINGS: Cardiovascular: There is a saddle embolus extending into branches in the upper and lower lobes of both lungs. No evidence of right heart strain. Heart is borderline in size. Aorta normal caliber. Mediastinum/Nodes: No mediastinal, hilar, or axillary adenopathy. Trachea and esophagus are unremarkable. Thyroid unremarkable. Lungs/Pleura: Lungs are clear. No focal airspace opacities or suspicious nodules. No effusions. Upper Abdomen: No acute findings.  Prior partial hepatectomy. Musculoskeletal: Chest wall soft tissues are unremarkable. No acute bony abnormality. Review of the MIP images confirms the above findings. IMPRESSION: Saddle embolus extends into pulmonary arteries in both upper and lower lobes bilaterally. No evidence of right heart strain. These results were called by telephone at the time of interpretation on 12/29/2021 at 7:41 pm to provider HMarks, who verbally acknowledged these results. Electronically Signed   By: KRolm BaptiseM.D.   On: 12/29/2021 19:42   VAS [place]OWER EXTREMITY VENOUS (DVT)  Result Date:  12/29/2021  Lower Venous DVT Study Patient Name:  Raymond Allen Date of Exam:   12/29/2021 Medical Rec #: [id number]        Accession #:    [id number] of Birth: 7June 26, 1962        Patient Gender: M Patient Age:   65years Exam Location:  [facility] Procedure:      VAS [place]OWER EXTREMITY VENOUS (DVT) Referring Phys: EEllwood Handler--------------------------------------------------------------------------------  Indications: Patient presents with swelling and pain of the left calf. He reports that it has been that way for some while but the swelling has been more noticeable this week.  Risk Factors: Cancer Colon cancer DVT History of DVT. Anticoagulation: Xarelto. Performing Technologist: DMariane MastersRVT  Examination Guidelines: A complete evaluation includes B-mode imaging, spectral Doppler, color Doppler, and power Doppler as needed of all accessible portions of each vessel. Bilateral testing is considered an integral part of a complete examination. Limited examinations for reoccurring indications may be performed as noted. The reflux portion of the exam is performed with the patient in reverse Trendelenburg.  +-----+---------------+---------+-----------+----------+--------------+ RIGHTCompressibilityPhasicitySpontaneityPropertiesThrombus Aging +-----+---------------+---------+-----------+----------+--------------+ CFV  Full           Yes      Yes                                 +-----+---------------+---------+-----------+----------+--------------+   +---------+---------------+---------+-----------+----------------+-------------+ LEFT     CompressibilityPhasicitySpontaneityProperties      Thrombus                                                                  Aging         +---------+---------------+---------+-----------+----------------+-------------+ CFV      Full           Yes      Yes                                       +---------+---------------+---------+-----------+----------------+-------------+  SFJ      Full           Yes      Yes                                      +---------+---------------+---------+-----------+----------------+-------------+ FV Prox  Full           Yes      Yes                                      +---------+---------------+---------+-----------+----------------+-------------+ FV Mid   Full           Yes      Yes                                      +---------+---------------+---------+-----------+----------------+-------------+ FV DistalFull           Yes      Yes                                      +---------+---------------+---------+-----------+----------------+-------------+ PFV      Full                                                             +---------+---------------+---------+-----------+----------------+-------------+ POP      Full           Yes      Yes                                      +---------+---------------+---------+-----------+----------------+-------------+ PTV      Full           Yes      Yes                                      +---------+---------------+---------+-----------+----------------+-------------+ PERO     None           No       No         softly echogenicAcute         +---------+---------------+---------+-----------+----------------+-------------+ Gastroc  Partial        No       No         spongy          Acute                                                     w/compression                 +---------+---------------+---------+-----------+----------------+-------------+  Findings reported to [place], PA-C at 4:00 pm.  Summary: RIGHT: - No evidence of common femoral vein  obstruction.  LEFT: - Findings consistent with acute deep vein thrombosis involving the left peroneal veins, and left gastrocnemius veins. - No cystic structure found in the popliteal fossa. - All other veins visualized  appear fully compressible and demonstrate appropriate Doppler characteristics.  *See table(s) above for measurements and observations.    Preliminary     Procedures .Critical Care  Performed by: Dorie Rank, MD Authorized by: Dorie Rank, MD   Critical care provider statement:    Critical care time (minutes):  30   Critical care was time spent personally by me on the following activities:  Development of treatment plan with patient or surrogate, discussions with consultants, evaluation of patient's response to treatment, examination of patient, ordering and review of laboratory studies, ordering and review of radiographic studies, ordering and performing treatments and interventions, pulse oximetry, re-evaluation of patient's condition and review of old charts     Medications Ordered in ED Medications  heparin bolus via infusion 5,000 Units (0 Units Intravenous Hold 12/29/21 2210)  heparin ADULT infusion 100 units/mL (25000 units/286m) (0 Units/hr Intravenous Hold 12/29/21 2209)  iohexol (OMNIPAQUE) 350 MG/ML injection 80 mL (80 mLs Intravenous Contrast Given 12/29/21 1936)    ED Course/ Medical Decision Making/ A&P Clinical Course as of 12/29/21 2257  Thu Dec 29, 2021  2029 CT scan shows a saddle embolism.  No signs of heart strain [JK]  2040 CBC with Differential(!) [JK]  2040 Normal [JK]  27106Basic metabolic panel Normal [JK]  2040 Troponin I (High Sensitivity) Normal [JK]  2148 Case discussed with Dr AJosephine Cables[JK]    Clinical Course User Index [JK] KDorie Rank MD                           Medical Decision Making Differential diagnosis includes but not limited to pneumonia, CHF, pulmonary embolism, acute coronary syndrome  Amount and/or Complexity of Data Reviewed Labs: ordered. Decision-making details documented in ED Course. Radiology: ordered.    Details: Patient had a Doppler study today that showed an acute DVT  Risk Prescription drug management. Drug therapy  requiring intensive monitoring for toxicity. Decision regarding hospitalization. Risk Details: Patient presented to the ED for evaluation of leg swelling shortness of breath.  Patient had a Doppler study that did show an acute DVT.  Patient is already on anticoagulants.  He has also noticed some increasing shortness of breath CT angiogram was performed and unfortunately does show a saddle pulmonary embolism.  Patient very upset about this finding.  He does not want to be admitted to the hospital.  I had a long discussion with him going over the dangers of this illness.  I also explained that his pulmonary embolism was not a small 1 and could be potentially life-threatening.  Patient agrees at this time to at least speak with the hospitalist service.          Final Clinical Impression(s) / ED Diagnoses Final diagnoses:  Acute saddle pulmonary embolism with acute cor pulmonale ([facility]    Rx / DC Orders ED Discharge Orders     None         KDorie Rank MD 12/29/21 2257

## 2021-12-29 NOTE — Progress Notes (Signed)
Left lower extremity venous duplex test completed. See results under Chart Review-CV proc. There is evidence of DVT in the left gastrocnemius and peroneal veins. Preliminary results called to Ellwood Handler, PA-C, who instructed patient to go to the E.D. at [facility] for further evaluation and treatment. Final report to follow.

## 2021-12-29 NOTE — ED Notes (Signed)
Admitting MD paged regarding inappropriate level of care

## 2021-12-29 NOTE — ED Notes (Addendum)
Pt family approaches. Asks about wait time. Informed about process. Concerned about pt low blood sugar- has monitor on. Pt offered snack. Pt refuses several times. Upset about wait time. Thanked for patience.

## 2021-12-29 NOTE — H&P (Signed)
History and Physical    Patient: Raymond Allen NAT:[id number] DOB: 1961-03-28 DOA: 12/29/2021 DOS: the patient was seen and examined on 12/30/2021 PCP: [place], Julie M, NP  Patient coming from: Home  Chief Complaint:  Chief Complaint  Patient presents with   Shortness of Breath   Leg Pain    L   HPI: [name] is a 61 y.o. male with medical history significant of T2DM, DVT on Xarelto, hypertension, colon cancer status post partial colectomy (2001) with liver mets status post left hepatectomy who presents to the emergency department due to 3 weeks of left leg pain.  Patient complained of increased swelling in left leg that was associated with some pain which was worse on ambulation.  He also endorsed shortness of breath on ambulation which there was chronic, but appears to be worse within the past few weeks.  He went to his PCPs office today and an outpatient Doppler study ordered was positive for DVT and was asked to go to the ED for further evaluation and management.  Patient denies chest pain, fever, chills, nausea, abdominal pain, vomiting.  ED Course:  In the emergency department, he was intermittently tachypneic, BP on arrival was 110 9/78 and other vital signs were within normal range.  Work-up in the ED showed normal CBC and BMP, BNP 25.6, troponin x2 was negative. CT angiography chest with contrast showed saddle embolus extends into pulmonary arteries in both upper and lower lobes bilaterally no evidence of right heart strain Lower extremity ultrasound showed no evidence of common femoral vein obstruction in the right leg but showed findings consistent with acute DVT involving the left peroneal veins and left gastrocnemius veins. Patient was started on IV heparin drip, hospitalist was asked to admit patient for further evaluation and management.  Review of Systems: Review of systems as noted in the HPI. All other systems reviewed and are negative.   Past Medical History:   Diagnosis Date   Cancer [facility])    Colon cancer metastasized to liver ([place]) 01/05/2012   Colon carcinoma ([place])    colon ca dx 2001;   Diabetes mellitus without complication ([place])    Hypertension    Muscle weakness-general 01/05/2012   Past Surgical History:  Procedure Laterality Date   CHOLECYSTECTOMY     COLON SURGERY     sigmoid colectomy, appendectomy   INCISIONAL HERNIA REPAIR N/A 11/27/2017   Procedure: LAPAROSCOPIC ASSISTED INCISIONAL HERNIA;  Surgeon: Clovis Riley, MD;  Location: [facility];  Service: General;  Laterality: N/A;   INSERTION OF MESH N/A 11/27/2017   Procedure: INSERTION OF MESH;  Surgeon: Clovis Riley, MD;  Location: [facility];  Service: General;  Laterality: N/A;   LAPAROSCOPIC LYSIS OF ADHESIONS N/A 11/27/2017   Procedure: LAPAROSCOPIC LYSIS OF ADHESIONS;  Surgeon: Clovis Riley, MD;  Location: [facility];  Service: General;  Laterality: N/A;   LIVER SURGERY     partial removal [facility] in 2003   PORT-A-CATH REMOVAL Right 07/16/2012   Procedure: REMOVAL PORT-A-CATH;  Surgeon: Haywood Lasso, MD;  Location: [place];  Service: General;  Laterality: Right;   RIGHT/LEFT HEART CATH AND CORONARY ANGIOGRAPHY N/A 04/01/2021   Procedure: RIGHT/LEFT HEART CATH AND CORONARY ANGIOGRAPHY;  Surgeon: Jolaine Artist, MD;  Location: [place] CV LAB;  Service: Cardiovascular;  Laterality: N/A;    Social History:  reports that he quit smoking about 6 years ago. His smoking use included cigarettes. He has a 16.00 pack-year smoking history. He  has never used smokeless tobacco. He reports that he does not drink alcohol and does not use drugs.   No Known Allergies  History reviewed. No pertinent family history.   Prior to Admission medications   Medication Sig Start Date End Date Taking? Authorizing Provider  amiodarone (PACERONE) 200 MG tablet Take 200 mg by mouth daily.   Yes [provider]  carvedilol (COREG) 3.125 MG tablet Take 1 tablet  (3.125 mg total) by mouth 2 (two) times daily. 06/03/21 06/03/22 Yes Milford, Maricela Bo, FNP  cyclobenzaprine (FLEXERIL) 10 MG tablet Take 10 mg by mouth 3 (three) times daily as needed for muscle spasms. 08/12/13  Yes [provider]  digoxin (LANOXIN) 0.125 MG tablet Take 1 tablet (0.125 mg total) by mouth daily. 05/19/21  Yes Bensimhon, Shaune Pascal, MD  empagliflozin (JARDIANCE) 10 MG TABS tablet Take 1 tablet (10 mg total) by mouth daily. 05/19/21  Yes Bensimhon, Shaune Pascal, MD  furosemide (LASIX) 40 MG tablet Take 0.5 tablets (20 mg total) by mouth daily. 10/17/21  Yes [place], Maricela Bo, FNP  gabapentin (NEURONTIN) 300 MG capsule Take 300 mg by mouth 2 (two) times daily. 10/23/17  Yes [provider]  metFORMIN (GLUCOPHAGE-XR) 500 MG 24 hr tablet Take 1,000 mg by mouth 2 (two) times daily. 10/16/17  Yes [provider]  oxyCODONE-acetaminophen (PERCOCET) 7.5-325 MG tablet Take 1 tablet by mouth every 6 (six) hours as needed for severe pain (back pain). 05/26/21  Yes [provider]  OZEMPIC, 1 MG/DOSE, 4 MG/3ML SOPN Inject 1 mg into the skin every Monday. 02/04/21  Yes [provider]  potassium chloride SA (KLOR-CON M) 20 MEQ tablet Take 1 tablet (20 mEq total) by mouth daily. 07/15/21  Yes Bensimhon, Shaune Pascal, MD  rosuvastatin (CRESTOR) 20 MG tablet Take 20 mg by mouth daily. 03/10/21  Yes [provider]  sacubitril-valsartan (ENTRESTO) 97-103 MG Take 1 tablet by mouth 2 (two) times daily. 05/19/21  Yes Bensimhon, Shaune Pascal, MD  sildenafil (REVATIO) 20 MG tablet Take 20 mg by mouth daily as needed (ED).   Yes [provider]  spironolactone (ALDACTONE) 25 MG tablet Take 1 tablet (25 mg total) by mouth daily. 05/19/21  Yes Bensimhon, Shaune Pascal, MD  TRESIBA FLEXTOUCH 200 UNIT/ML FlexTouch Pen Inject 30 Units into the skin every evening. 03/22/21  Yes [provider]  XARELTO 20 MG TABS tablet Take 20 mg by mouth at bedtime. 03/05/21  Yes [provider]    Physical Exam: BP (!) 148/90   Pulse 91   Temp 98.3 F (36.8 C) (Oral)   Resp (!) 23   Ht 6' (1.829 m)   Wt 117.9 kg   SpO2 95%   BMI 35.26 kg/m   General: 61 y.o. year-old male well developed well nourished in no acute distress.  Alert and oriented x3. HEENT: NCAT, EOMI Neck: Supple, trachea medial Cardiovascular: Regular rate and rhythm with no rubs or gallops.  No thyromegaly or JVD noted.  No lower extremity edema. 2/4 pulses in all 4 extremities. Respiratory: Clear to auscultation with no wheezes or rales. Good inspiratory effort. Abdomen: Soft, nontender nondistended with normal bowel sounds x4 quadrants. Muskuloskeletal: No cyanosis, clubbing or edema noted bilaterally Neuro: Left leg edema with tenderness noted.  Chronic venous stasis noted.  CN II-XII intact, strength 5/5 x 4, sensation, reflexes intact Skin: No ulcerative lesions noted or rashes Psychiatry: Judgement and insight appear normal. Mood is appropriate for condition and setting  Labs on Admission:  Basic Metabolic Panel: Recent Labs  Lab 12/29/21 1703  NA 140  K 3.8  CL 107  CO2 24  GLUCOSE 84  BUN 10  CREATININE 1.21  CALCIUM 9.1   Liver Function Tests: No results for input(s): "AST", "ALT", "ALKPHOS", "BILITOT", "PROT", "ALBUMIN" in the last 168 hours. No results for input(s): "LIPASE", "AMYLASE" in the last 168 hours. No results for input(s): "AMMONIA" in the last 168 hours. CBC: Recent Labs  Lab 12/29/21 1703  WBC 7.6  NEUTROABS 4.6  HGB 14.9  HCT 48.5  MCV 84.2  PLT 215   Cardiac Enzymes: No results for input(s): "CKTOTAL", "CKMB", "CKMBINDEX", "TROPONINI" in the last 168 hours.  BNP (last 3 results) Recent Labs    03/29/21 0502 07/15/21 1547 12/29/21 1703  BNP 391.6* 37.7 25.6    ProBNP (last 3 results) No results for input(s): "PROBNP" in the last 8760 hours.  CBG: No results for input(s): "GLUCAP" in the last 168 hours.  Radiological  Exams on Admission: CT Angio Chest PE W/Cm &/Or Wo Cm  Result Date: 12/29/2021 CLINICAL DATA:  Pulmonary embolism (PE) suspected, high prob.  DVT. EXAM: CT ANGIOGRAPHY CHEST WITH CONTRAST TECHNIQUE: Multidetector CT imaging of the chest was performed using the standard protocol during bolus administration of intravenous contrast. Multiplanar CT image reconstructions and MIPs were obtained to evaluate the vascular anatomy. RADIATION DOSE REDUCTION: This exam was performed according to the departmental dose-optimization program which includes automated exposure control, adjustment of the mA and/or kV according to patient size and/or use of iterative reconstruction technique. CONTRAST:  75m OMNIPAQUE IOHEXOL 350 MG/ML SOLN COMPARISON:  11/08/2021 FINDINGS: Cardiovascular: There is a saddle embolus extending into branches in the upper and lower lobes of both lungs. No evidence of right heart strain. Heart is borderline in size. Aorta normal caliber. Mediastinum/Nodes: No mediastinal, hilar, or axillary adenopathy. Trachea and esophagus are unremarkable. Thyroid unremarkable. Lungs/Pleura: Lungs are clear. No focal airspace opacities or suspicious nodules. No effusions. Upper Abdomen: No acute findings.  Prior partial hepatectomy. Musculoskeletal: Chest wall soft tissues are unremarkable. No acute bony abnormality. Review of the MIP images confirms the above findings. IMPRESSION: Saddle embolus extends into pulmonary arteries in both upper and lower lobes bilaterally. No evidence of right heart strain. These results were called by telephone at the time of interpretation on 12/29/2021 at 7:41 pm to provider [place], who verbally acknowledged these results. Electronically Signed   By: KRolm BaptiseM.D.   On: 12/29/2021 19:42   VAS [place]OWER EXTREMITY VENOUS (DVT)  Result Date: 12/29/2021  Lower Venous DVT Study Patient Name:  PHIEP OLLIS Date of Exam:   12/29/2021 Medical Rec #: [id number]        Accession  #:    [id number] of Birth: [birth year]        Patient Gender: M Patient Age:   89years Exam Location:  [facility] Procedure:      VAS [place]OWER EXTREMITY VENOUS (DVT) Referring Phys: EEllwood Handler--------------------------------------------------------------------------------  Indications: Patient presents with swelling and pain of the left calf. He reports that it has been that way for some while but the swelling has been more noticeable this week.  Risk Factors: Cancer Colon cancer DVT History of DVT. Anticoagulation: Xarelto. Performing Technologist: DMariane MastersRVT  Examination Guidelines: A complete evaluation includes B-mode imaging, spectral Doppler, color Doppler, and power Doppler as needed of all accessible portions of each vessel. Bilateral testing is considered  an integral part of a complete examination. Limited examinations for reoccurring indications may be performed as noted. The reflux portion of the exam is performed with the patient in reverse Trendelenburg.  +-----+---------------+---------+-----------+----------+--------------+ RIGHTCompressibilityPhasicitySpontaneityPropertiesThrombus Aging +-----+---------------+---------+-----------+----------+--------------+ CFV  Full           Yes      Yes                                 +-----+---------------+---------+-----------+----------+--------------+   +---------+---------------+---------+-----------+----------------+-------------+ LEFT     CompressibilityPhasicitySpontaneityProperties      Thrombus                                                                  Aging         +---------+---------------+---------+-----------+----------------+-------------+ CFV      Full           Yes      Yes                                      +---------+---------------+---------+-----------+----------------+-------------+ SFJ      Full           Yes      Yes                                       +---------+---------------+---------+-----------+----------------+-------------+ FV Prox  Full           Yes      Yes                                      +---------+---------------+---------+-----------+----------------+-------------+ FV Mid   Full           Yes      Yes                                      +---------+---------------+---------+-----------+----------------+-------------+ FV DistalFull           Yes      Yes                                      +---------+---------------+---------+-----------+----------------+-------------+ PFV      Full                                                             +---------+---------------+---------+-----------+----------------+-------------+ POP      Full           Yes      Yes                                      +---------+---------------+---------+-----------+----------------+-------------+  PTV      Full           Yes      Yes                                      +---------+---------------+---------+-----------+----------------+-------------+ PERO     None           No       No         softly echogenicAcute         +---------+---------------+---------+-----------+----------------+-------------+ Gastroc  Partial        No       No         spongy          Acute                                                     w/compression                 +---------+---------------+---------+-----------+----------------+-------------+  Findings reported to [place], PA-C at 4:00 pm.  Summary: RIGHT: - No evidence of common femoral vein obstruction.  LEFT: - Findings consistent with acute deep vein thrombosis involving the left peroneal veins, and left gastrocnemius veins. - No cystic structure found in the popliteal fossa. - All other veins visualized appear fully compressible and demonstrate appropriate Doppler characteristics.  *See table(s) above for measurements and observations.    Preliminary     EKG: I  independently viewed the EKG done and my findings are as followed: Normal sinus rhythm at a rate of 91 bpm  Assessment/Plan Present on Admission:  Pulmonary embolism (HCC)  Principal Problem:   Pulmonary embolism (HCC) Active Problems:   Type 2 diabetes mellitus without complication (HCC)   Chronic combined systolic and diastolic CHF (congestive heart failure) (HCC)   Atrial fibrillation, chronic (HCC)   Mixed hyperlipidemia   Obesity (BMI 30-39.9)   Acute saddle pulmonary embolism Patient presented with left leg pain with swelling and increased shortness of breath on exertion BNP was normal CT angiography chest with contrast showed saddle embolus extends into pulmonary arteries in both upper and lower lobes bilaterally no evidence of right heart strain Patient was already on Xarelto due to prior DVT and he states that he was compliant with the medication. He was started on IV heparin drip Consider hematology consult considering patient developing PE despite being on Xarelto Echocardiogram done on 07/15/2021 showed LVEF of 30 to 35%.  LV has moderately decreased function.  LV demonstrates global hypokinesis.  G1 DD.  Echocardiogram will be done in the morning  Type 2 diabetes mellitus Continue ISS and hypoglycemic protocol  Chronic combined systolic and diastolic CHF Continue Coreg, Entresto, Crestor  Chronic atrial fibrillation Continue amiodarone, Coreg, digoxin Patient is currently on heparin drip  Mixed hyperlipidemia Continue Crestor  Obesity (BMI 35.26) Diet and lifestyle modification  DVT prophylaxis: Heparin drip  Code Status: Full code  Consults: None  Family Communication: None at bedside  Severity of Illness: The appropriate patient status for this patient is OBSERVATION. Observation status is judged to be reasonable and necessary in order to provide the required intensity of service to ensure the patient's safety. The patient's presenting symptoms,  physical exam findings, and initial radiographic  and laboratory data in the context of their medical condition is felt to place them at decreased risk for further clinical deterioration. Furthermore, it is anticipated that the patient will be medically stable for discharge from the hospital within 2 midnights of admission.   Author: Bernadette Hoit, DO 12/30/2021 2:26 AM  For on call review [email or web address].

## 2021-12-29 NOTE — ED Triage Notes (Signed)
Pt c/o blood clot in L thigh x3wks. Seen for DVT study today, positive, sent to ED for further eval/mgmt

## 2021-12-29 NOTE — ED Notes (Signed)
This RN and Hydrographic surveyor at bedside attempting to start ordered heparin drip for patient. Patient adamantly refusing medication at this time and stating that he needs to go home and he doesn't need to stay in the hospital. This RN attempted to explain risks involved with leaving the hospital against medical advice and patient still wanted to leave and go to his house stating "I have things I need to take care of and I need to watch over my house. I'm not gonna die, God's got me." Provider Tomi Bamberger made aware of same. Barnetta Chapel RN at bedside to attempt to explain benefits of staying in hospital and receiving ordered heparin. Patient continues to refuse.

## 2021-12-29 NOTE — Progress Notes (Signed)
ANTICOAGULATION CONSULT NOTE - Initial Consult  Pharmacy Consult for heparin Indication: pulmonary embolus  No Known Allergies  Patient Measurements: Height: 6' (182.9 cm) Weight: 117.9 kg (260 lb) IBW/kg (Calculated) : 77.6 Heparin Dosing Weight: 103.3kg  Vital Signs: Temp: 98.3 F (36.8 C) (09/14 2000) Temp Source: Oral (09/14 2000) BP: 150/95 (09/14 2000) Pulse Rate: 82 (09/14 2000)  Labs: Recent Labs    12/29/21 1703 12/29/21 1850  HGB 14.9  --   HCT 48.5  --   PLT 215  --   CREATININE 1.21  --   TROPONINIHS 10 12    Estimated Creatinine Clearance: 82.8 mL/min (by C-G formula based on SCr of 1.21 mg/dL).   Medical History: Past Medical History:  Diagnosis Date   Cancer [facility])    Colon cancer metastasized to liver ([place]) 01/05/2012   Colon carcinoma ([place])    colon ca dx 2001;   Diabetes mellitus without complication ([place])    Hypertension    Muscle weakness-general 01/05/2012   Assessment: 22 yoM with PMH of history of stage III colon cancer, HTN, T2DM, HLD, atrial flutter, CHF, mitral regurgitation, previous LLE DVT (on xarelto '20mg'$ ) who presented today for surgical evaluation of AAA. CTA suggested a pulmonary embolism. Pharmacy consulted to dose heparin. CBC stable, last dose of Xarelto 9/13 2100.  Goal of Therapy:  Heparin level 0.3-0.7 units/ml aPTT 66-102 seconds. Check aPTT levels due to DOAC until correlates with the heparin level. Monitor platelets by anticoagulation protocol: Yes   Plan:  Give 5000 units bolus x 1  Start heparin infusion at 1750 units/hr Check anti-Xa level and aPTT in 6 hours and daily while on heparin. Continue to monitor H&H and platelets  Sandford Craze, PharmD. [facility] Acute Care PGY-1 12/29/2021 8:49 PM

## 2021-12-30 ENCOUNTER — Observation Stay (HOSPITAL_COMMUNITY): Payer: Medicare Other

## 2021-12-30 DIAGNOSIS — Z86718 Personal history of other venous thrombosis and embolism: Secondary | ICD-10-CM | POA: Diagnosis not present

## 2021-12-30 DIAGNOSIS — G8929 Other chronic pain: Secondary | ICD-10-CM | POA: Diagnosis present

## 2021-12-30 DIAGNOSIS — Z794 Long term (current) use of insulin: Secondary | ICD-10-CM | POA: Diagnosis not present

## 2021-12-30 DIAGNOSIS — I4892 Unspecified atrial flutter: Secondary | ICD-10-CM | POA: Diagnosis present

## 2021-12-30 DIAGNOSIS — I5042 Chronic combined systolic (congestive) and diastolic (congestive) heart failure: Secondary | ICD-10-CM | POA: Diagnosis present

## 2021-12-30 DIAGNOSIS — I2699 Other pulmonary embolism without acute cor pulmonale: Secondary | ICD-10-CM | POA: Diagnosis present

## 2021-12-30 DIAGNOSIS — I110 Hypertensive heart disease with heart failure: Secondary | ICD-10-CM | POA: Diagnosis present

## 2021-12-30 DIAGNOSIS — E669 Obesity, unspecified: Secondary | ICD-10-CM

## 2021-12-30 DIAGNOSIS — I2602 Saddle embolus of pulmonary artery with acute cor pulmonale: Secondary | ICD-10-CM

## 2021-12-30 DIAGNOSIS — Z79899 Other long term (current) drug therapy: Secondary | ICD-10-CM | POA: Diagnosis not present

## 2021-12-30 DIAGNOSIS — I482 Chronic atrial fibrillation, unspecified: Secondary | ICD-10-CM | POA: Diagnosis present

## 2021-12-30 DIAGNOSIS — Z7984 Long term (current) use of oral hypoglycemic drugs: Secondary | ICD-10-CM | POA: Diagnosis not present

## 2021-12-30 DIAGNOSIS — M549 Dorsalgia, unspecified: Secondary | ICD-10-CM | POA: Diagnosis present

## 2021-12-30 DIAGNOSIS — Z7901 Long term (current) use of anticoagulants: Secondary | ICD-10-CM | POA: Diagnosis not present

## 2021-12-30 DIAGNOSIS — E782 Mixed hyperlipidemia: Secondary | ICD-10-CM | POA: Diagnosis present

## 2021-12-30 DIAGNOSIS — Z9049 Acquired absence of other specified parts of digestive tract: Secondary | ICD-10-CM | POA: Diagnosis not present

## 2021-12-30 DIAGNOSIS — Z6835 Body mass index (BMI) 35.0-35.9, adult: Secondary | ICD-10-CM | POA: Diagnosis not present

## 2021-12-30 DIAGNOSIS — Z87891 Personal history of nicotine dependence: Secondary | ICD-10-CM | POA: Diagnosis not present

## 2021-12-30 DIAGNOSIS — Z85038 Personal history of other malignant neoplasm of large intestine: Secondary | ICD-10-CM | POA: Diagnosis not present

## 2021-12-30 DIAGNOSIS — E119 Type 2 diabetes mellitus without complications: Secondary | ICD-10-CM | POA: Diagnosis present

## 2021-12-30 LAB — CBC
HCT: 47.5 % (ref 39.0–52.0)
HCT: 49.4 % (ref 39.0–52.0)
Hemoglobin: 14.8 g/dL (ref 13.0–17.0)
Hemoglobin: 14.9 g/dL (ref 13.0–17.0)
MCH: 26.1 pg (ref 26.0–34.0)
MCH: 25.8 pg — ABNORMAL LOW (ref 26.0–34.0)
MCHC: 31.2 g/dL (ref 30.0–36.0)
MCHC: 30.2 g/dL (ref 30.0–36.0)
MCV: 83.9 fL (ref 80.0–100.0)
MCV: 85.6 fL (ref 80.0–100.0)
Platelets: 219 10*3/uL (ref 150–400)
Platelets: 211 10*3/uL (ref 150–400)
RBC: 5.66 MIL/uL (ref 4.22–5.81)
RBC: 5.77 MIL/uL (ref 4.22–5.81)
RDW: 14.6 % (ref 11.5–15.5)
RDW: 14.8 % (ref 11.5–15.5)
WBC: 7.7 10*3/uL (ref 4.0–10.5)
WBC: 6.9 10*3/uL (ref 4.0–10.5)
nRBC: 0 % (ref 0.0–0.2)
nRBC: 0 % (ref 0.0–0.2)

## 2021-12-30 LAB — ECHOCARDIOGRAM COMPLETE
AR max vel: 4.29 cm2
AV Area VTI: 4.45 cm2
AV Area mean vel: 4.33 cm2
AV Mean grad: 2 mmHg
AV Peak grad: 4.2 mmHg
Ao pk vel: 1.02 m/s
Area-P 1/2: 3.77 cm2
Calc EF: 35.7 %
Height: 72 in
S' Lateral: 4.3 cm
Single Plane A2C EF: 34.9 %
Single Plane A4C EF: 34.4 %
Weight: 4160 oz

## 2021-12-30 LAB — HEMOGLOBIN A1C
Hgb A1c MFr Bld: 7.4 % — ABNORMAL HIGH (ref 4.8–5.6)
Mean Plasma Glucose: 165.68 mg/dL

## 2021-12-30 LAB — GLUCOSE, CAPILLARY
Glucose-Capillary: 76 mg/dL (ref 70–99)
Glucose-Capillary: 108 mg/dL — ABNORMAL HIGH (ref 70–99)

## 2021-12-30 LAB — COMPREHENSIVE METABOLIC PANEL
ALT: 14 U/L (ref 0–44)
AST: 11 U/L — ABNORMAL LOW (ref 15–41)
Albumin: 3.3 g/dL — ABNORMAL LOW (ref 3.5–5.0)
Alkaline Phosphatase: 43 U/L (ref 38–126)
Anion gap: 10 (ref 5–15)
BUN: 10 mg/dL (ref 8–23)
CO2: 26 mmol/L (ref 22–32)
Calcium: 9.2 mg/dL (ref 8.9–10.3)
Chloride: 104 mmol/L (ref 98–111)
Creatinine, Ser: 1.19 mg/dL (ref 0.61–1.24)
GFR, Estimated: >60 mL/min (ref >60)
Glucose, Bld: 76 mg/dL (ref 70–99)
Potassium: 3.7 mmol/L (ref 3.5–5.1)
Sodium: 140 mmol/L (ref 135–145)
Total Bilirubin: 1.7 mg/dL — ABNORMAL HIGH (ref 0.3–1.2)
Total Protein: 6.8 g/dL (ref 6.5–8.1)

## 2021-12-30 LAB — APTT
aPTT: 65 seconds — ABNORMAL HIGH (ref 24–36)
aPTT: 47 seconds — ABNORMAL HIGH (ref 24–36)
aPTT: 69 seconds — ABNORMAL HIGH (ref 24–36)

## 2021-12-30 LAB — PHOSPHORUS: Phosphorus: 3.6 mg/dL (ref 2.5–4.6)

## 2021-12-30 LAB — MAGNESIUM: Magnesium: 2.1 mg/dL (ref 1.7–2.4)

## 2021-12-30 LAB — HEPARIN LEVEL (UNFRACTIONATED): Heparin Unfractionated: 0.95 IU/mL — ABNORMAL HIGH (ref 0.30–0.70)

## 2021-12-30 MED ORDER — EMPAGLIFLOZIN 10 MG PO TABS
10.0000 mg | ORAL_TABLET | Freq: Every day | ORAL | Status: DC
  Administered 2021-12-30: 10 mg via ORAL
  Filled 2021-12-30 (×2): qty 1

## 2021-12-30 MED ORDER — SACUBITRIL-VALSARTAN 97-103 MG PO TABS
1.0000 | ORAL_TABLET | Freq: Two times a day (BID) | ORAL | Status: DC
  Administered 2021-12-30 – 2021-12-31 (×3): 1 via ORAL
  Filled 2021-12-30 (×4): qty 1

## 2021-12-30 MED ORDER — CYCLOBENZAPRINE HCL 10 MG PO TABS
10.0000 mg | ORAL_TABLET | Freq: Three times a day (TID) | ORAL | Status: DC | PRN
  Administered 2021-12-30: 10 mg via ORAL
  Filled 2021-12-30: qty 1

## 2021-12-30 MED ORDER — POTASSIUM CHLORIDE CRYS ER 20 MEQ PO TBCR
20.0000 meq | EXTENDED_RELEASE_TABLET | Freq: Every day | ORAL | Status: DC
  Administered 2021-12-30 – 2021-12-31 (×2): 20 meq via ORAL
  Filled 2021-12-30 (×2): qty 1

## 2021-12-30 MED ORDER — INSULIN ASPART 100 UNIT/ML IJ SOLN: 0.0000 [IU] | Freq: Three times a day (TID) | INTRAMUSCULAR | Status: DC

## 2021-12-30 MED ORDER — HEPARIN BOLUS VIA INFUSION
2500.0000 [IU] | Freq: Once | INTRAVENOUS | Status: AC
  Administered 2021-12-30: 2500 [IU] via INTRAVENOUS
  Filled 2021-12-30: qty 2500

## 2021-12-30 MED ORDER — PERFLUTREN LIPID MICROSPHERE
1.0000 mL | INTRAVENOUS | Status: AC | PRN
  Administered 2021-12-30: 2 mL via INTRAVENOUS

## 2021-12-30 MED ORDER — OXYCODONE-ACETAMINOPHEN 7.5-325 MG PO TABS
1.0000 | ORAL_TABLET | Freq: Four times a day (QID) | ORAL | Status: DC | PRN
  Administered 2021-12-30: 1 via ORAL
  Filled 2021-12-30 (×2): qty 1

## 2021-12-30 MED ORDER — FUROSEMIDE 20 MG PO TABS
20.0000 mg | ORAL_TABLET | Freq: Every day | ORAL | Status: DC
  Administered 2021-12-30 – 2021-12-31 (×2): 20 mg via ORAL
  Filled 2021-12-30 (×2): qty 1

## 2021-12-30 MED ORDER — SPIRONOLACTONE 25 MG PO TABS
25.0000 mg | ORAL_TABLET | Freq: Every day | ORAL | Status: DC
  Administered 2021-12-30 – 2021-12-31 (×2): 25 mg via ORAL
  Filled 2021-12-30 (×2): qty 1

## 2021-12-30 MED ORDER — CARVEDILOL 3.125 MG PO TABS
3.1250 mg | ORAL_TABLET | Freq: Two times a day (BID) | ORAL | Status: DC
  Administered 2021-12-30 – 2021-12-31 (×3): 3.125 mg via ORAL
  Filled 2021-12-30 (×3): qty 1

## 2021-12-30 MED ORDER — ROSUVASTATIN CALCIUM 20 MG PO TABS
20.0000 mg | ORAL_TABLET | Freq: Every day | ORAL | Status: DC
  Administered 2021-12-30 – 2021-12-31 (×2): 20 mg via ORAL
  Filled 2021-12-30 (×2): qty 1

## 2021-12-30 MED ORDER — AMIODARONE HCL 200 MG PO TABS
200.0000 mg | ORAL_TABLET | Freq: Every day | ORAL | Status: DC
  Administered 2021-12-30 – 2021-12-31 (×2): 200 mg via ORAL
  Filled 2021-12-30 (×2): qty 1

## 2021-12-30 MED ORDER — INSULIN GLARGINE-YFGN 100 UNIT/ML ~~LOC~~ SOLN
20.0000 [IU] | Freq: Every day | SUBCUTANEOUS | Status: DC
  Filled 2021-12-30: qty 0.2

## 2021-12-30 MED ORDER — GABAPENTIN 300 MG PO CAPS
300.0000 mg | ORAL_CAPSULE | Freq: Two times a day (BID) | ORAL | Status: DC
  Administered 2021-12-30: 300 mg via ORAL
  Filled 2021-12-30 (×2): qty 1

## 2021-12-30 MED ORDER — INSULIN GLARGINE-YFGN 100 UNIT/ML ~~LOC~~ SOLN: 20.0000 [IU] | Freq: Every day | SUBCUTANEOUS | Status: DC

## 2021-12-30 MED ORDER — DIGOXIN 125 MCG PO TABS
0.1250 mg | ORAL_TABLET | Freq: Every day | ORAL | Status: DC
  Administered 2021-12-30 – 2021-12-31 (×2): 0.125 mg via ORAL
  Filled 2021-12-30 (×2): qty 1

## 2021-12-30 NOTE — ED Notes (Signed)
Echo at the bedside °

## 2021-12-30 NOTE — Progress Notes (Signed)
ANTICOAGULATION CONSULT NOTE - Initial Consult  Pharmacy Consult for heparin Indication: pulmonary embolus  No Known Allergies  Patient Measurements: Height: 6' (182.9 cm) Weight: 117.9 kg (260 lb) IBW/kg (Calculated) : 77.6 Heparin Dosing Weight: 103.3kg  Vital Signs: Temp: 97.8 F (36.6 C) (09/15 1035) Temp Source: Oral (09/15 1035) BP: 145/90 (09/15 1135) Pulse Rate: 91 (09/15 1138)  Labs: Recent Labs    12/29/21 1703 12/29/21 1850 12/30/21 0315 12/30/21 0523 12/30/21 1307  HGB 14.9  --  14.8 14.9  --   HCT 48.5  --  47.5 49.4  --   PLT 215  --  219 211  --   APTT  --   --  65*  --  47*  HEPARINUNFRC  --   --  0.95*  --   --   CREATININE 1.21  --  1.19  --   --   TROPONINIHS 10 12  --   --   --      Estimated Creatinine Clearance: 84.2 mL/min (by C-G formula based on SCr of 1.19 mg/dL).   Medical History: Past Medical History:  Diagnosis Date   Cancer [facility])    Colon cancer metastasized to liver ([place]) 01/05/2012   Colon carcinoma ([place])    colon ca dx 2001;   Diabetes mellitus without complication ([place])    Hypertension    Muscle weakness-general 01/05/2012   Assessment: 22 yoM with PMH of history of stage III colon cancer, HTN, T2DM, HLD, atrial flutter, CHF, mitral regurgitation, previous LLE DVT (on xarelto '20mg'$ ) who presented today for surgical evaluation of AAA. CTA suggested a pulmonary embolism. Pharmacy consulted to dose heparin. CBC stable, last dose of Xarelto 9/13 2100.  Initial aPTT is subtherapeutic at 47 seconds, heparin level elevated due to Xarelto influence. CBC stable this am.  Goal of Therapy:  Heparin level 0.3-0.7 units/ml aPTT 66-102 seconds. Check aPTT levels due to DOAC until correlates with the heparin level. Monitor platelets by anticoagulation protocol: Yes   Plan:  Heparin bolus 2500 units x 1 Increase heparin to 2100 units/h Recheck aPTT in 6h  Bernell Sigal A. Levada Dy, PharmD, BCPS, FNKF Clinical Pharmacist [place] Please  utilize Amion for appropriate phone number to reach the unit pharmacist ([place])  12/30/2021

## 2021-12-30 NOTE — ED Notes (Signed)
Pt removed from 2L Nasal Canula. Pt on room air 94%

## 2021-12-30 NOTE — ED Notes (Signed)
Pt O2 maintaining at 89% with consistent pleth. RN placed 2L nasal canula pt O2 94%.   Pt is Aox4 and watching tv

## 2021-12-30 NOTE — Progress Notes (Signed)
ANTICOAGULATION CONSULT NOTE  Pharmacy Consult for heparin Indication: pulmonary embolus  No Known Allergies  Patient Measurements: Height: 6' (182.9 cm) Weight: 117.9 kg (260 lb) IBW/kg (Calculated) : 77.6 Heparin Dosing Weight: 103.3kg  Vital Signs: Temp: 98.3 F (36.8 C) (09/15 1932) Temp Source: Oral (09/15 1932) BP: 120/88 (09/15 1932) Pulse Rate: 88 (09/15 1932)  Labs: Recent Labs    12/29/21 1703 12/29/21 1850 12/30/21 0315 12/30/21 0523 12/30/21 1307 12/30/21 2153  HGB 14.9  --  14.8 14.9  --   --   HCT 48.5  --  47.5 49.4  --   --   PLT 215  --  219 211  --   --   APTT  --   --  65*  --  47* 69*  HEPARINUNFRC  --   --  0.95*  --   --   --   CREATININE 1.21  --  1.19  --   --   --   TROPONINIHS 10 12  --   --   --   --      Estimated Creatinine Clearance: 84.2 mL/min (by C-G formula based on SCr of 1.19 mg/dL).   Medical History: Past Medical History:  Diagnosis Date   Cancer [facility])    Colon cancer metastasized to liver ([place]) 01/05/2012   Colon carcinoma ([place])    colon ca dx 2001;   Diabetes mellitus without complication ([place])    Hypertension    Muscle weakness-general 01/05/2012   Assessment: 62 yoM with PMH of history of stage III colon cancer, HTN, T2DM, HLD, atrial flutter, CHF, mitral regurgitation, previous LLE DVT (on xarelto '20mg'$ ) who presented today for surgical evaluation of AAA. CTA suggested a pulmonary embolism. Pharmacy consulted to dose heparin. CBC stable, last dose of Xarelto 9/13 2100.  APTT this evening is at goal at 69 seconds.  No overt bleeding or complications noted.    Goal of Therapy:  Heparin level 0.3-0.7 units/ml aPTT 66-102 seconds. Check aPTT levels due to DOAC until correlates with the heparin level. Monitor platelets by anticoagulation protocol: Yes   Plan:  Continue heparin at 2100 units/h Daily aPTT, heparin level and CBC.  [place] Crane, Roylene Reason, BCCP Clinical Pharmacist  12/30/2021 10:28 PM   [facility]  pharmacy phone numbers are listed on [email or web address]

## 2021-12-30 NOTE — Progress Notes (Signed)
ANTICOAGULATION CONSULT NOTE - Initial Consult  Pharmacy Consult for heparin Indication: pulmonary embolus  No Known Allergies  Patient Measurements: Height: 6' (182.9 cm) Weight: 117.9 kg (260 lb) IBW/kg (Calculated) : 77.6 Heparin Dosing Weight: 103.3kg  Vital Signs: Temp: 98.3 F (36.8 C) (09/14 2000) Temp Source: Oral (09/14 2000) BP: 145/102 (09/15 0300) Pulse Rate: 89 (09/15 0300)  Labs: Recent Labs    12/29/21 1703 12/29/21 1850 12/30/21 0315  HGB 14.9  --  14.8  HCT 48.5  --  47.5  PLT 215  --  219  APTT  --   --  65*  HEPARINUNFRC  --   --  0.95*  CREATININE 1.21  --   --   TROPONINIHS 10 12  --      Estimated Creatinine Clearance: 82.8 mL/min (by C-G formula based on SCr of 1.21 mg/dL).   Medical History: Past Medical History:  Diagnosis Date   Cancer [facility])    Colon cancer metastasized to liver ([place]) 01/05/2012   Colon carcinoma ([place])    colon ca dx 2001;   Diabetes mellitus without complication ([place])    Hypertension    Muscle weakness-general 01/05/2012   Assessment: 35 yoM with PMH of history of stage III colon cancer, HTN, T2DM, HLD, atrial flutter, CHF, mitral regurgitation, previous LLE DVT (on xarelto '20mg'$ ) who presented today for surgical evaluation of AAA. CTA suggested a pulmonary embolism. Pharmacy consulted to dose heparin. CBC stable, last dose of Xarelto 9/13 2100.  Initial aPTT is slightly subtherapeutic at 65 seconds, heparin level elevated due to Xarelto influence. CBC stable this am.  Goal of Therapy:  Heparin level 0.3-0.7 units/ml aPTT 66-102 seconds. Check aPTT levels due to DOAC until correlates with the heparin level. Monitor platelets by anticoagulation protocol: Yes   Plan:  Increase heparin to 1850 units/h Recheck aPTT in 6h  Arrie Senate, PharmD, [place], [facility] Clinical Pharmacist Please check AMION for all [place] numbers 12/30/2021

## 2021-12-30 NOTE — ED Notes (Signed)
Pt is very upset that he is still in a room and not IP. Pt states he is being lied to and want to speak to the MD. Pt is taking off monitoring equipment and stating he should be at [facility]. Pt requested to speak with MD. RN notified Attending   RN educated the importance that he notifies staff before getting up and moving around d/t his heparin infusion. Pt states there is nothing wrong with him and he has been independent since 61 y.o. Pt says he is uncomfortable and doesn't feel he is being cared for.

## 2021-12-30 NOTE — Progress Notes (Signed)
PROGRESS NOTE    Raymond Allen  SAY:[id number] DOB: 06/18/1960 DOA: 12/29/2021 PCP: [place], Julie M, NP    Brief Narrative:  61 year old male with a history of chronic systolic congestive heart failure, atrial flutter, previous DVT on anticoagulation, diabetes, remote history of colon cancer, admitted to the hospital with acute left lower extremity DVT, shortness of breath.  Found to have saddle pulmonary embolus on CT imaging.  Started on anticoagulation with IV heparin.   Assessment & Plan:   Principal Problem:   Pulmonary embolism (HCC) Active Problems:   Type 2 diabetes mellitus without complication (HCC)   Chronic combined systolic and diastolic CHF (congestive heart failure) (HCC)   Atrial fibrillation, chronic (HCC)   Mixed hyperlipidemia   Obesity (BMI 30-39.9)  Acute saddle pulmonary embolus Acute left lower extremity DVT -Reports compliance with Xarelto at home -Echocardiogram has been ordered -Currently on IV heparin -Discussed with heme/ond, Dr. Chryl Heck with recommendations to discharge on Lovenox until followed by outpatient hematology -Continue IV heparin for now -Continue ambulating patient to monitor oxygen saturations, shortness of breath, chest pain  Chronic systolic congestive heart failure -EF of 30 to 35% -Currently appears to be compensated -Continue Lasix, Aldactone, Jardiance, carvedilol, Entresto  Insulin-dependent diabetes -Chronically on Tresiba, Ozempic, metformin -Continue on sliding scale -We will also continue on basal insulin -Holding Ozempic and metformin while in the hospital  Hyperlipidemia -Continue statin  Chronic back pain -Continue home dose of oxycodone as well as gabapentin and Flexeril  History of atrial tachycardia versus atrial flutter -CHA2DS2-VASc of 4 okay -Chronically on amiodarone, digoxin -He is on anticoagulation  Obesity, class II -BMI 35.2 -Diet and lifestyle modifications   DVT prophylaxis: SCDs Start:  12/30/21 0107  Code Status: full code Family Communication: discussed with significant other at bedside Disposition Plan: Status is: Observation The patient will require care spanning > 2 midnights and should be moved to inpatient because: continued IV heparin for saddle PE     Consultants:    Procedures:    Antimicrobials:      Subjective: Sitting up in chair. He says he does have shortness of breath on exertion  Objective: Vitals:   12/30/21 0430 12/30/21 0500 12/30/21 0553 12/30/21 0700  BP: (!) 133/95 (!) 144/98  (!) 121/92  Pulse: 83 88  91  Resp: (!) '22 14  19  '$ Temp:   97.8 F (36.6 C)   TempSrc:   Oral   SpO2:  91%  92%  Weight:      Height:       No intake or output data in the 24 hours ending 12/30/21 0948 Filed Weights   12/29/21 1635  Weight: 117.9 kg    Examination:  General exam: Appears calm and comfortable  Respiratory system: Clear to auscultation. Respiratory effort normal. Cardiovascular system: S1 & S2 heard, RRR. No JVD, murmurs, rubs, gallops or clicks. Gastrointestinal system: Abdomen is nondistended, soft and nontender. No organomegaly or masses felt. Normal bowel sounds heard. Central nervous system: Alert and oriented. No focal neurological deficits. Extremities: LLE swelling Skin: No rashes, lesions or ulcers Psychiatry: Judgement and insight appear normal. Mood & affect appropriate.     Data Reviewed: I have personally reviewed following labs and imaging studies  CBC: Recent Labs  Lab 12/29/21 1703 12/30/21 0315 12/30/21 0523  WBC 7.6 7.7 6.9  NEUTROABS 4.6  --   --   HGB 14.9 14.8 14.9  HCT 48.5 47.5 49.4  MCV 84.2 83.9 85.6  PLT 215 219 211  Basic Metabolic Panel: Recent Labs  Lab 12/29/21 1703 12/30/21 0315  NA 140 140  K 3.8 3.7  CL 107 104  CO2 24 26  GLUCOSE 84 76  BUN 10 10  CREATININE 1.21 1.19  CALCIUM 9.1 9.2  MG  --  2.1  PHOS  --  3.6   GFR: Estimated Creatinine Clearance: 84.2 mL/min (by  C-G formula based on SCr of 1.19 mg/dL). Liver Function Tests: Recent Labs  Lab 12/30/21 0315  AST 11*  ALT 14  ALKPHOS 43  BILITOT 1.7*  PROT 6.8  ALBUMIN 3.3*   No results for input(s): "LIPASE", "AMYLASE" in the last 168 hours. No results for input(s): "AMMONIA" in the last 168 hours. Coagulation Profile: No results for input(s): "INR", "PROTIME" in the last 168 hours. Cardiac Enzymes: No results for input(s): "CKTOTAL", "CKMB", "CKMBINDEX", "TROPONINI" in the last 168 hours. BNP (last 3 results) No results for input(s): "PROBNP" in the last 8760 hours. HbA1C: Recent Labs    12/30/21 0315  HGBA1C 7.4*   CBG: No results for input(s): "GLUCAP" in the last 168 hours. Lipid Profile: No results for input(s): "CHOL", "HDL", "LDLCALC", "TRIG", "CHOLHDL", "LDLDIRECT" in the last 72 hours. Thyroid Function Tests: No results for input(s): "TSH", "T4TOTAL", "FREET4", "T3FREE", "THYROIDAB" in the last 72 hours. Anemia Panel: No results for input(s): "VITAMINB12", "FOLATE", "FERRITIN", "TIBC", "IRON", "RETICCTPCT" in the last 72 hours. Sepsis Labs: No results for input(s): "PROCALCITON", "LATICACIDVEN" in the last 168 hours.  No results found for this or any previous visit (from the past 240 hour(s)).       Radiology Studies: CT Angio Chest PE W/Cm &/Or Wo Cm  Result Date: 12/29/2021 CLINICAL DATA:  Pulmonary embolism (PE) suspected, high prob.  DVT. EXAM: CT ANGIOGRAPHY CHEST WITH CONTRAST TECHNIQUE: Multidetector CT imaging of the chest was performed using the standard protocol during bolus administration of intravenous contrast. Multiplanar CT image reconstructions and MIPs were obtained to evaluate the vascular anatomy. RADIATION DOSE REDUCTION: This exam was performed according to the departmental dose-optimization program which includes automated exposure control, adjustment of the mA and/or kV according to patient size and/or use of iterative reconstruction technique.  CONTRAST:  72m OMNIPAQUE IOHEXOL 350 MG/ML SOLN COMPARISON:  11/08/2021 FINDINGS: Cardiovascular: There is a saddle embolus extending into branches in the upper and lower lobes of both lungs. No evidence of right heart strain. Heart is borderline in size. Aorta normal caliber. Mediastinum/Nodes: No mediastinal, hilar, or axillary adenopathy. Trachea and esophagus are unremarkable. Thyroid unremarkable. Lungs/Pleura: Lungs are clear. No focal airspace opacities or suspicious nodules. No effusions. Upper Abdomen: No acute findings.  Prior partial hepatectomy. Musculoskeletal: Chest wall soft tissues are unremarkable. No acute bony abnormality. Review of the MIP images confirms the above findings. IMPRESSION: Saddle embolus extends into pulmonary arteries in both upper and lower lobes bilaterally. No evidence of right heart strain. These results were called by telephone at the time of interpretation on 12/29/2021 at 7:41 pm to provider HM[place], who verbally acknowledged these results. Electronically Signed   By: KRolm BaptiseM.D.   On: 12/29/2021 19:42   VAS [place]OWER EXTREMITY VENOUS (DVT)  Result Date: 12/29/2021  Lower Venous DVT Study Patient Name:  PJERRARD BRADBURN Date of Exam:   12/29/2021 Medical Rec #: [id number]        Accession #:    [id number] of Birth: 08/20/1960-11-22        Patient Gender: M Patient Age:   89  years Exam Location:  [facility] Procedure:      VAS [place] LOWER EXTREMITY VENOUS (DVT) Referring Phys: Junie Panning BARRETT --------------------------------------------------------------------------------  Indications: Patient presents with swelling and pain of the left calf. He reports that it has been that way for some while but the swelling has been more noticeable this week.  Risk Factors: Cancer Colon cancer DVT History of DVT. Anticoagulation: Xarelto. Performing Technologist: Mariane Masters RVT  Examination Guidelines: A complete evaluation includes B-mode imaging, spectral Doppler, color  Doppler, and power Doppler as needed of all accessible portions of each vessel. Bilateral testing is considered an integral part of a complete examination. Limited examinations for reoccurring indications may be performed as noted. The reflux portion of the exam is performed with the patient in reverse Trendelenburg.  +-----+---------------+---------+-----------+----------+--------------+ RIGHTCompressibilityPhasicitySpontaneityPropertiesThrombus Aging +-----+---------------+---------+-----------+----------+--------------+ CFV  Full           Yes      Yes                                 +-----+---------------+---------+-----------+----------+--------------+   +---------+---------------+---------+-----------+----------------+-------------+ LEFT     CompressibilityPhasicitySpontaneityProperties      Thrombus                                                                  Aging         +---------+---------------+---------+-----------+----------------+-------------+ CFV      Full           Yes      Yes                                      +---------+---------------+---------+-----------+----------------+-------------+ SFJ      Full           Yes      Yes                                      +---------+---------------+---------+-----------+----------------+-------------+ FV Prox  Full           Yes      Yes                                      +---------+---------------+---------+-----------+----------------+-------------+ FV Mid   Full           Yes      Yes                                      +---------+---------------+---------+-----------+----------------+-------------+ FV DistalFull           Yes      Yes                                      +---------+---------------+---------+-----------+----------------+-------------+ PFV      Full                                                              +---------+---------------+---------+-----------+----------------+-------------+  POP      Full           Yes      Yes                                      +---------+---------------+---------+-----------+----------------+-------------+ PTV      Full           Yes      Yes                                      +---------+---------------+---------+-----------+----------------+-------------+ PERO     None           No       No         softly echogenicAcute         +---------+---------------+---------+-----------+----------------+-------------+ Gastroc  Partial        No       No         spongy          Acute                                                     w/compression                 +---------+---------------+---------+-----------+----------------+-------------+  Findings reported to [place], PA-C at 4:00 pm.  Summary: RIGHT: - No evidence of common femoral vein obstruction.  LEFT: - Findings consistent with acute deep vein thrombosis involving the left peroneal veins, and left gastrocnemius veins. - No cystic structure found in the popliteal fossa. - All other veins visualized appear fully compressible and demonstrate appropriate Doppler characteristics.  *See table(s) above for measurements and observations.    Preliminary         Scheduled Meds:  amiodarone  200 mg Oral Daily   carvedilol  3.125 mg Oral BID   digoxin  0.125 mg Oral Daily   empagliflozin  10 mg Oral Daily   furosemide  20 mg Oral Daily   gabapentin  300 mg Oral BID   insulin aspart  0-9 Units Subcutaneous TID WC   insulin glargine-yfgn  20 Units Subcutaneous Daily   potassium chloride SA  20 mEq Oral Daily   rosuvastatin  20 mg Oral Daily   sacubitril-valsartan  1 tablet Oral BID   spironolactone  25 mg Oral Daily   Continuous Infusions:  heparin Stopped (12/30/21 0831)     LOS: 0 days    Time spent: 87mns    JKathie Dike MD [facility]   If 7PM-7AM, please  contact night-coverage [email or web address]  12/30/2021, 9:48 AM

## 2021-12-30 NOTE — ED Notes (Signed)
Pt agreed to resume heparin drip.

## 2021-12-30 NOTE — ED Notes (Signed)
Secretary paged floor cvg for this RN.

## 2021-12-30 NOTE — ED Notes (Signed)
MD at the bedside  

## 2021-12-30 NOTE — ED Notes (Signed)
Pt ambulated to restroom with a steady gait without incident

## 2021-12-30 NOTE — ED Notes (Signed)
Pt declined PRN percocet at this time because when taking gabapentin it makes him more sleepy than usual. Pt stated he will let RN know when he needs PRN medication

## 2021-12-30 NOTE — ED Notes (Signed)
Called 5N Charge RN to request commencement of purple man.

## 2021-12-30 NOTE — ED Notes (Signed)
Pt declined to have heparin infusion going. RN educate the importance of the medication. Pt states nothing is wrong with him and he's been fine his whole life. RN d/c dripped notified MD.

## 2021-12-30 NOTE — ED Notes (Signed)
Pt requesting to leave, stating "I should've gone to another hospital, cone isn't right, nothing wrong with you though". This RN attempted to deescalate. Pt calm and sitting on side of bed. This RN notified pt that I will page MD.

## 2021-12-30 NOTE — ED Notes (Signed)
RN educated pt that he can ambulate to the restroom with his IV pump. Pt still has street clothes and shoes on. Pt is sitting at the end of bed leaning over chair with lights off.

## 2021-12-30 NOTE — ED Notes (Signed)
Pt's O2 85% RA while sleeping, pt unaware if he has sleep apnea. Pt placed on 2L [place], O2 now 95%.

## 2021-12-31 DIAGNOSIS — I2602 Saddle embolus of pulmonary artery with acute cor pulmonale: Secondary | ICD-10-CM

## 2021-12-31 LAB — CBC
HCT: 48.7 % (ref 39.0–52.0)
Hemoglobin: 15.5 g/dL (ref 13.0–17.0)
MCH: 26.4 pg (ref 26.0–34.0)
MCHC: 31.8 g/dL (ref 30.0–36.0)
MCV: 83 fL (ref 80.0–100.0)
Platelets: 234 10*3/uL (ref 150–400)
RBC: 5.87 MIL/uL — ABNORMAL HIGH (ref 4.22–5.81)
RDW: 14.6 % (ref 11.5–15.5)
WBC: 8 10*3/uL (ref 4.0–10.5)
nRBC: 0 % (ref 0.0–0.2)

## 2021-12-31 LAB — GLUCOSE, CAPILLARY
Glucose-Capillary: 75 mg/dL (ref 70–99)
Glucose-Capillary: 97 mg/dL (ref 70–99)
Glucose-Capillary: 105 mg/dL — ABNORMAL HIGH (ref 70–99)

## 2021-12-31 LAB — HEPARIN LEVEL (UNFRACTIONATED): Heparin Unfractionated: 0.84 IU/mL — ABNORMAL HIGH (ref 0.30–0.70)

## 2021-12-31 LAB — APTT: aPTT: 87 seconds — ABNORMAL HIGH (ref 24–36)

## 2021-12-31 MED ORDER — ENOXAPARIN SODIUM 120 MG/0.8ML IJ SOSY
1.0000 mg/kg | PREFILLED_SYRINGE | Freq: Two times a day (BID) | INTRAMUSCULAR | Status: DC
  Administered 2021-12-31: 114 mg via SUBCUTANEOUS
  Filled 2021-12-31 (×2): qty 0.76

## 2021-12-31 MED ORDER — ENOXAPARIN SODIUM 120 MG/0.8ML IJ SOSY: 120.0000 mg | PREFILLED_SYRINGE | Freq: Two times a day (BID) | INTRAMUSCULAR | 1 refills | Status: DC

## 2021-12-31 NOTE — TOC Progression Note (Signed)
Transition of Care [facility]) - Progression Note    Patient Details  Name: Raymond Allen MRN: [id number] Date of Birth: 1960/12/24  Transition of Care [facility]) CM/SW Contact  Bartholomew Crews, RN Phone Number: [phone] 12/31/2021, 3:09 PM  Clinical Narrative:     Chart reviewed. TOC consult for benefits check for lovenox. Discussion with MD that this cannot be done on weekends. Prescription sent to community pharmacy. No other TOC needs identified.        Expected Discharge Plan and Services           Expected Discharge Date: 12/31/21                                     Social Determinants of Health (SDOH) Interventions    Readmission Risk Interventions     No data to display

## 2021-12-31 NOTE — Plan of Care (Signed)
  Problem: Education: Goal: Knowledge of General Education information will improve Description: Including pain rating scale, medication(s)/side effects and non-pharmacologic comfort measures Outcome: Progressing   Problem: Pain Managment: Goal: General experience of comfort will improve Outcome: Progressing   Problem: Safety: Goal: Ability to remain free from injury will improve Outcome: Progressing   

## 2021-12-31 NOTE — Progress Notes (Signed)
ANTICOAGULATION CONSULT NOTE  Pharmacy Consult for heparin Indication: pulmonary embolus  No Known Allergies  Patient Measurements: Height: 6' (182.9 cm) Weight: 117.9 kg (260 lb) IBW/kg (Calculated) : 77.6 Heparin Dosing Weight: 103.3kg  Vital Signs: Temp: 98.3 F (36.8 C) (09/16 0345) Temp Source: Oral (09/16 0345) BP: 137/90 (09/16 0345) Pulse Rate: 80 (09/16 0345)  Labs: Recent Labs    12/29/21 1703 12/29/21 1703 12/29/21 1850 12/30/21 0315 12/30/21 0523 12/30/21 1307 12/30/21 2153 12/31/21 0118  HGB 14.9  --   --  14.8 14.9  --   --  15.5  HCT 48.5  --   --  47.5 49.4  --   --  48.7  PLT 215  --   --  219 211  --   --  234  APTT  --    < >  --  65*  --  47* 69* 87*  HEPARINUNFRC  --   --   --  0.95*  --   --   --  0.84*  CREATININE 1.21  --   --  1.19  --   --   --   --   TROPONINIHS 10  --  12  --   --   --   --   --    < > = values in this interval not displayed.     Estimated Creatinine Clearance: 84.2 mL/min (by C-G formula based on SCr of 1.19 mg/dL).   Medical History: Past Medical History:  Diagnosis Date   Cancer [facility])    Colon cancer metastasized to liver ([place]) 01/05/2012   Colon carcinoma ([place])    colon ca dx 2001;   Diabetes mellitus without complication ([place])    Hypertension    Muscle weakness-general 01/05/2012   Assessment: 89 yoM with PMH of history of stage III colon cancer, HTN, T2DM, HLD, atrial flutter, CHF, mitral regurgitation, previous LLE DVT (on xarelto '20mg'$ ) who presented today for surgical evaluation of AAA. CTA suggested a pulmonary embolism. Pharmacy consulted to dose heparin. CBC stable, last dose of Xarelto 9/13 2100.  APTT this morning is at goal at 87 seconds.  No overt bleeding or complications noted. Heparin level not correlating with APTT at this point. Will continue monitoring APTT   Goal of Therapy:  Heparin level 0.3-0.7 units/ml aPTT 66-102 seconds. Check aPTT levels due to DOAC until correlates with the heparin  level. Monitor platelets by anticoagulation protocol: Yes   Plan:  Continue heparin at 2100 units/h Daily aPTT, heparin level and CBC.  Shaindy Reader A. Levada Dy, PharmD, BCPS, FNKF Clinical Pharmacist [place] Please utilize Amion for appropriate phone number to reach the unit pharmacist ([place])

## 2021-12-31 NOTE — Discharge Summary (Signed)
Physician Discharge Summary  Raymond Allen HYW:[id number] DOB: Jun 29, 1960 DOA: 12/29/2021  PCP: [place], Julie M, NP  Admit date: 12/29/2021 Discharge date: 12/31/2021  Admitted From: Home Disposition: Home  Recommendations for Outpatient Follow-up:  Follow up with PCP in 1-2 weeks Please obtain BMP/CBC in one week Patient referral to hematology has been made   Discharge Condition: Stable CODE STATUS: Full code Diet recommendation: Heart healthy  Brief/Interim Summary: 61 year old male with a history of chronic systolic congestive heart failure, atrial flutter, previous DVT on anticoagulation, diabetes, remote history of colon cancer, admitted to the hospital with acute left lower extremity DVT, shortness of breath.  Found to have saddle pulmonary embolus on CT imaging.  Started on anticoagulation with IV heparin.  Discharge Diagnoses:  Principal Problem:   Pulmonary embolism (HCC) Active Problems:   Type 2 diabetes mellitus without complication (HCC)   Chronic combined systolic and diastolic CHF (congestive heart failure) (HCC)   Atrial fibrillation, chronic (HCC)   Mixed hyperlipidemia   Obesity (BMI 30-39.9)  Acute saddle pulmonary embolus Acute left lower extremity DVT -Reports compliance with Xarelto at home -Echocardiogram did not show any evidence of right heart strain -Currently on IV heparin -Discussed with heme/ond, Raymond Allen with recommendations to discharge on Lovenox until followed by outpatient hematology -At the time of discharge, he was able to ambulate on room air without chest pain or shortness of breath.  He was not hypoxic   Chronic systolic congestive heart failure -EF of 30 to 35% -Currently appears to be compensated -Continue Lasix, Aldactone, Jardiance, carvedilol, Entresto -Repeat echo   Insulin-dependent diabetes -Chronically on Tresiba, Ozempic, metformin -Continue on sliding scale -We will also continue on basal insulin -Holding Ozempic and  metformin while in the hospital   Hyperlipidemia -Continue statin   Chronic back pain -Continue home dose of oxycodone as well as gabapentin and Flexeril   History of atrial tachycardia versus atrial flutter -CHA2DS2-VASc of 4  -Chronically on amiodarone, digoxin -He is on anticoagulation   Obesity, class II -BMI 35.2 -Diet and lifestyle modifications  Discharge Instructions  Discharge Instructions     Ambulatory referral to Hematology / Oncology   Complete by: As directed    Patient was discussed with Raymond Allen who recommended outpatient hematology referral. He was admitted with acute pulmonary embolus and is noted to be a xarelto failure, now on lovenox injections   Diet - low sodium heart healthy   Complete by: As directed    Increase activity slowly   Complete by: As directed       Allergies as of 12/31/2021   No Known Allergies      Medication List     STOP taking these medications    Xarelto 20 MG Tabs tablet Generic drug: rivaroxaban       TAKE these medications    amiodarone 200 MG tablet Commonly known as: PACERONE Take 200 mg by mouth daily.   carvedilol 3.125 MG tablet Commonly known as: Coreg Take 1 tablet (3.125 mg total) by mouth 2 (two) times daily.   cyclobenzaprine 10 MG tablet Commonly known as: FLEXERIL Take 10 mg by mouth 3 (three) times daily as needed for muscle spasms.   digoxin 0.125 MG tablet Commonly known as: LANOXIN Take 1 tablet (0.125 mg total) by mouth daily.   empagliflozin 10 MG Tabs tablet Commonly known as: Jardiance Take 1 tablet (10 mg total) by mouth daily.   enoxaparin 120 MG/0.8ML injection Commonly known as: LOVENOX Inject 0.8 mLs (120  mg total) into the skin 2 (two) times daily.   furosemide 40 MG tablet Commonly known as: LASIX Take 0.5 tablets (20 mg total) by mouth daily.   gabapentin 300 MG capsule Commonly known as: NEURONTIN Take 300 mg by mouth 2 (two) times daily.   metFORMIN 500 MG 24 hr  tablet Commonly known as: GLUCOPHAGE-XR Take 1,000 mg by mouth 2 (two) times daily.   oxyCODONE-acetaminophen 7.5-325 MG tablet Commonly known as: PERCOCET Take 1 tablet by mouth every 6 (six) hours as needed for severe pain (back pain).   Ozempic (1 MG/DOSE) 4 MG/3ML Sopn Generic drug: Semaglutide (1 MG/DOSE) Inject 1 mg into the skin every Monday.   potassium chloride SA 20 MEQ tablet Commonly known as: KLOR-CON Allen Take 1 tablet (20 mEq total) by mouth daily.   rosuvastatin 20 MG tablet Commonly known as: CRESTOR Take 20 mg by mouth daily.   sacubitril-valsartan 97-103 MG Commonly known as: ENTRESTO Take 1 tablet by mouth 2 (two) times daily.   sildenafil 20 MG tablet Commonly known as: REVATIO Take 20 mg by mouth daily as needed (ED).   spironolactone 25 MG tablet Commonly known as: ALDACTONE Take 1 tablet (25 mg total) by mouth daily.   Raymond Allen 200 UNIT/ML Allen Pen Generic drug: insulin degludec Inject 30 Units into the skin every evening.        No Known Allergies  Consultations:    Procedures/Studies: ECHOCARDIOGRAM COMPLETE  Result Date: 12/30/2021    ECHOCARDIOGRAM REPORT   Patient Name:   [name] Date of Exam: 12/30/2021 Medical Rec #:  [id number]        Height:       72.0 in Accession #:    [id number]       Weight:       260.0 lb Date of Birth:  10/13/1960        BSA:          2.382 Allen Patient Age:    8 years         BP:           121/92 mmHg Patient Gender: Allen                HR:           87 bpm. Exam Location:  Inpatient Procedure: 2D Echo, Color Doppler, Cardiac Doppler and Intracardiac            Opacification Agent Indications:    Pulmonary embolus  History:        Patient has prior history of Echocardiogram examinations, most                 recent 07/15/2021. Risk Factors:Hypertension and Diabetes.  Sonographer:    Memory Argue Referring Phys: [id number] OLADAPO ADEFESO IMPRESSIONS  1. Left ventricular ejection fraction, by  estimation, is 30 to 35%. The left ventricle has moderately decreased function. The left ventricle demonstrates regional wall motion abnormalities (see scoring diagram/findings for description). The left ventricular internal cavity size was mildly dilated. There is mild left ventricular hypertrophy. Left ventricular diastolic parameters are indeterminate.  2. Right ventricular systolic function is normal. The right ventricular size is normal.  3. Left atrial size was mild to moderately dilated.  4. The mitral valve is normal in structure. Trivial mitral valve regurgitation. No evidence of mitral stenosis.  5. The aortic valve was not well visualized. There is mild calcification of the aortic valve. Aortic valve regurgitation is trivial. Aortic valve sclerosis/calcification  is present, without any evidence of aortic stenosis.  6. Aortic dilatation noted. There is mild dilatation of the ascending aorta, measuring 42 mm. Comparison(s): No significant change from prior study. Conclusion(s)/Recommendation(s): No left ventricular mural or apical thrombus/thrombi. FINDINGS  Left Ventricle: Left ventricular ejection fraction, by estimation, is 30 to 35%. The left ventricle has moderately decreased function. The left ventricle demonstrates regional wall motion abnormalities. Definity contrast agent was given IV to delineate the left ventricular endocardial borders. The left ventricular internal cavity size was mildly dilated. There is mild left ventricular hypertrophy. Left ventricular diastolic parameters are indeterminate. Right Ventricle: The right ventricular size is normal. Right vetricular wall thickness was not well visualized. Right ventricular systolic function is normal. Left Atrium: Left atrial size was mild to moderately dilated. Right Atrium: Right atrial size was normal in size. Pericardium: There is no evidence of pericardial effusion. Mitral Valve: The mitral valve is normal in structure. Trivial mitral valve  regurgitation. No evidence of mitral valve stenosis. Tricuspid Valve: The tricuspid valve is not well visualized. Tricuspid valve regurgitation is trivial. No evidence of tricuspid stenosis. Aortic Valve: The aortic valve was not well visualized. There is mild calcification of the aortic valve. Aortic valve regurgitation is trivial. Aortic valve sclerosis/calcification is present, without any evidence of aortic stenosis. Aortic valve mean gradient measures 2.0 mmHg. Aortic valve peak gradient measures 4.2 mmHg. Aortic valve area, by VTI measures 4.45 cm. Pulmonic Valve: The pulmonic valve was not well visualized. Pulmonic valve regurgitation is not visualized. Aorta: Aortic dilatation noted. There is mild dilatation of the ascending aorta, measuring 42 mm. Venous: The inferior vena cava was not well visualized. IAS/Shunts: The interatrial septum was not well visualized.  LEFT VENTRICLE PLAX 2D LVIDd:         5.10 cm      Diastology LVIDs:         4.30 cm      LV e' medial:    5.77 cm/s LV PW:         1.30 cm      LV E/e' medial:  8.2 LV IVS:        1.40 cm      LV e' lateral:   6.09 cm/s LVOT diam:     2.70 cm      LV E/e' lateral: 7.7 LV SV:         76 LV SV Index:   32 LVOT Area:     5.73 cm  LV Volumes (MOD) LV vol d, MOD A2C: 166.0 ml LV vol d, MOD A4C: 189.0 ml LV vol s, MOD A2C: 108.0 ml LV vol s, MOD A4C: 124.0 ml LV SV MOD A2C:     58.0 ml LV SV MOD A4C:     189.0 ml LV SV MOD BP:      64.5 ml RIGHT VENTRICLE TAPSE (Allen-mode): 2.3 cm LEFT ATRIUM             Index        RIGHT ATRIUM           Index LA Vol (A2C):   66.9 ml 28.09 ml/Allen  RA Area:     12.50 cm LA Vol (A4C):   68.8 ml 28.88 ml/Allen  RA Volume:   24.90 ml  10.45 ml/Allen LA Biplane Vol: 72.3 ml 30.35 ml/Allen  AORTIC VALVE AV Area (Vmax):    4.29 cm AV Area (Vmean):   4.33 cm AV Area (VTI):     4.45 cm  AV Vmax:           102.00 cm/s AV Vmean:          67.900 cm/s AV VTI:            0.171 Allen AV Peak Grad:      4.2 mmHg AV Mean Grad:      2.0 mmHg LVOT  Vmax:         76.50 cm/s LVOT Vmean:        51.400 cm/s LVOT VTI:          0.133 Allen LVOT/AV VTI ratio: 0.78  AORTA Ao Root diam: 4.10 cm Ao Asc diam:  4.20 cm MITRAL VALVE MV Area (PHT): 3.77 cm    SHUNTS MV Decel Time: 201 msec    Systemic VTI:  0.13 Allen MV E velocity: 47.10 cm/s  Systemic Diam: 2.70 cm MV A velocity: 91.20 cm/s MV E/A ratio:  0.52 Buford Dresser MD Electronically signed by Buford Dresser MD Signature Date/Time: 12/30/2021/3:07:26 PM    Final    VAS [place] LOWER EXTREMITY VENOUS (DVT)  Result Date: 12/30/2021  Lower Venous DVT Study Patient Name:  RANELL SKIBINSKI  Date of Exam:   12/29/2021 Medical Rec #: [id number]         Accession #:    [id number] Date of Birth: [birth year]         Patient Gender: Allen Patient Age:   61 years Exam Location:  [facility] Procedure:      VAS [place] LOWER EXTREMITY VENOUS (DVT) Referring Phys: Ellwood Handler --------------------------------------------------------------------------------  Indications: Patient presents with swelling and pain of the left calf. He reports that it has been that way for some while but the swelling has been more noticeable this week.  Risk Factors: Cancer Colon cancer DVT History of DVT. Anticoagulation: Xarelto. Performing Technologist: Mariane Masters RVT  Examination Guidelines: A complete evaluation includes B-mode imaging, spectral Doppler, color Doppler, and power Doppler as needed of all accessible portions of each vessel. Bilateral testing is considered an integral part of a complete examination. Limited examinations for reoccurring indications may be performed as noted. The reflux portion of the exam is performed with the patient in reverse Trendelenburg.  +-----+---------------+---------+-----------+----------+--------------+ RIGHTCompressibilityPhasicitySpontaneityPropertiesThrombus Aging +-----+---------------+---------+-----------+----------+--------------+ CFV  Full           Yes      Yes                                  +-----+---------------+---------+-----------+----------+--------------+   +---------+---------------+---------+-----------+----------------+-------------+ LEFT     CompressibilityPhasicitySpontaneityProperties      Thrombus                                                                  Aging         +---------+---------------+---------+-----------+----------------+-------------+ CFV      Full           Yes      Yes                                      +---------+---------------+---------+-----------+----------------+-------------+ SFJ      Full  Yes      Yes                                      +---------+---------------+---------+-----------+----------------+-------------+ FV Prox  Full           Yes      Yes                                      +---------+---------------+---------+-----------+----------------+-------------+ FV Mid   Full           Yes      Yes                                      +---------+---------------+---------+-----------+----------------+-------------+ FV DistalFull           Yes      Yes                                      +---------+---------------+---------+-----------+----------------+-------------+ PFV      Full                                                             +---------+---------------+---------+-----------+----------------+-------------+ POP      Full           Yes      Yes                                      +---------+---------------+---------+-----------+----------------+-------------+ PTV      Full           Yes      Yes                                      +---------+---------------+---------+-----------+----------------+-------------+ PERO     None           No       No         softly echogenicAcute         +---------+---------------+---------+-----------+----------------+-------------+ Gastroc  Partial        No       No         spongy          Acute                                                      w/compression                 +---------+---------------+---------+-----------+----------------+-------------+   Findings reported to Floyd Hill, PA-C at 4:00 pm.  Summary: RIGHT: - No evidence of common femoral vein obstruction.  LEFT: - Findings consistent with acute deep vein thrombosis involving the left peroneal  veins, and left gastrocnemius veins. - No cystic structure found in the popliteal fossa. - All other veins visualized appear fully compressible and demonstrate appropriate Doppler characteristics.  *See table(s) above for measurements and observations. Electronically signed by Larae Grooms MD on 12/30/2021 at 12:49:39 PM.    Final    CT Angio Chest PE W/Cm &/Or Wo Cm  Result Date: 12/29/2021 CLINICAL DATA:  Pulmonary embolism (PE) suspected, high prob.  DVT. EXAM: CT ANGIOGRAPHY CHEST WITH CONTRAST TECHNIQUE: Multidetector CT imaging of the chest was performed using the standard protocol during bolus administration of intravenous contrast. Multiplanar CT image reconstructions and MIPs were obtained to evaluate the vascular anatomy. RADIATION DOSE REDUCTION: This exam was performed according to the departmental dose-optimization program which includes automated exposure control, adjustment of the mA and/or kV according to patient size and/or use of iterative reconstruction technique. CONTRAST:  15m OMNIPAQUE IOHEXOL 350 MG/ML SOLN COMPARISON:  11/08/2021 FINDINGS: Cardiovascular: There is a saddle embolus extending into branches in the upper and lower lobes of both lungs. No evidence of right heart strain. Heart is borderline in size. Aorta normal caliber. Mediastinum/Nodes: No mediastinal, hilar, or axillary adenopathy. Trachea and esophagus are unremarkable. Thyroid unremarkable. Lungs/Pleura: Lungs are clear. No focal airspace opacities or suspicious nodules. No effusions. Upper Abdomen: No acute findings.  Prior partial hepatectomy.  Musculoskeletal: Chest wall soft tissues are unremarkable. No acute bony abnormality. Review of the MIP images confirms the above findings. IMPRESSION: Saddle embolus extends into pulmonary arteries in both upper and lower lobes bilaterally. No evidence of right heart strain. These results were called by telephone at the time of interpretation on 12/29/2021 at 7:41 pm to provider HLitchfield, who verbally acknowledged these results. Electronically Signed   By: KRolm BaptiseM.D.   On: 12/29/2021 19:42      Subjective: He is feeling better.  No chest pain or shortness of breath.  Discharge Exam: Vitals:   12/31/21 0814 12/31/21 0941 12/31/21 1313 12/31/21 1801  BP: (!) 128/91 136/84  123/88  Pulse: 71 82 88 90  Resp: 18   15  Temp: 98.5 F (36.9 C)   98.1 F (36.7 C)  TempSrc: Oral   Oral  SpO2: 97%  95% 95%  Weight:   115.1 kg   Height:        General: Pt is alert, awake, not in acute distress Cardiovascular: RRR, S1/S2 +, no rubs, no gallops Respiratory: CTA bilaterally, no wheezing, no rhonchi Abdominal: Soft, NT, ND, bowel sounds + Extremities: no edema, no cyanosis    The results of significant diagnostics from this hospitalization (including imaging, microbiology, ancillary and laboratory) are listed below for reference.     Microbiology: No results found for this or any previous visit (from the past 240 hour(s)).   Labs: BNP (last 3 results) Recent Labs    03/29/21 0502 07/15/21 1547 12/29/21 1703  BNP 391.6* 37.7 289.3  Basic Metabolic Panel: Recent Labs  Lab 12/29/21 1703 12/30/21 0315  NA 140 140  K 3.8 3.7  CL 107 104  CO2 24 26  GLUCOSE 84 76  BUN 10 10  CREATININE 1.21 1.19  CALCIUM 9.1 9.2  MG  --  2.1  PHOS  --  3.6   Liver Function Tests: Recent Labs  Lab 12/30/21 0315  AST 11*  ALT 14  ALKPHOS 43  BILITOT 1.7*  PROT 6.8  ALBUMIN 3.3*   No results for input(s): "LIPASE", "AMYLASE" in the last 168 hours. No  results for input(s):  "AMMONIA" in the last 168 hours. CBC: Recent Labs  Lab 12/29/21 1703 12/30/21 0315 12/30/21 0523 12/31/21 0118  WBC 7.6 7.7 6.9 8.0  NEUTROABS 4.6  --   --   --   HGB 14.9 14.8 14.9 15.5  HCT 48.5 47.5 49.4 48.7  MCV 84.2 83.9 85.6 83.0  PLT 215 219 211 234   Cardiac Enzymes: No results for input(s): "CKTOTAL", "CKMB", "CKMBINDEX", "TROPONINI" in the last 168 hours. BNP: Invalid input(s): "POCBNP" CBG: Recent Labs  Lab 12/30/21 1622 12/30/21 1932 12/31/21 0811 12/31/21 1159 12/31/21 1706  GLUCAP 76 108* 75 97 105*   D-Dimer No results for input(s): "DDIMER" in the last 72 hours. Hgb A1c Recent Labs    12/30/21 0315  HGBA1C 7.4*   Lipid Profile No results for input(s): "CHOL", "HDL", "LDLCALC", "TRIG", "CHOLHDL", "LDLDIRECT" in the last 72 hours. Thyroid function studies No results for input(s): "TSH", "T4TOTAL", "T3FREE", "THYROIDAB" in the last 72 hours.  Invalid input(s): "FREET3" Anemia work up No results for input(s): "VITAMINB12", "FOLATE", "FERRITIN", "TIBC", "IRON", "RETICCTPCT" in the last 72 hours. Urinalysis    Component Value Date/Time   COLORURINE YELLOW 03/31/2021 1140   APPEARANCEUR CLEAR 03/31/2021 1140   LABSPEC 1.020 03/31/2021 1140   LABSPEC 1.030 10/03/2011 1419   PHURINE 6.0 03/31/2021 1140   GLUCOSEU NEGATIVE 03/31/2021 1140   GLUCOSEU NEGATIVE 01/21/2008 1213   HGBUR NEGATIVE 03/31/2021 1140   BILIRUBINUR NEGATIVE 03/31/2021 1140   BILIRUBINUR Negative 10/03/2011 1419   KETONESUR NEGATIVE 03/31/2021 1140   PROTEINUR NEGATIVE 03/31/2021 1140   UROBILINOGEN 0.2 mg/dL 01/21/2008 1213   NITRITE NEGATIVE 03/31/2021 1140   LEUKOCYTESUR NEGATIVE 03/31/2021 1140   LEUKOCYTESUR Negative 10/03/2011 1419   Sepsis Labs Recent Labs  Lab 12/29/21 1703 12/30/21 0315 12/30/21 0523 12/31/21 0118  WBC 7.6 7.7 6.9 8.0   Microbiology No results found for this or any previous visit (from the past 240 hour(s)).   Time coordinating  discharge: 35mns  SIGNED:   JKathie Dike MD  [facility] 12/31/2021, 8:34 PM   If 7PM-7AM, please contact night-coverage [email or web address]

## 2021-12-31 NOTE — Progress Notes (Signed)
Pt is irritable this morning. Complains about pain; pain medication was offered a couple of times; pt refused. CBG was 75; breakfast was ordered; when RN was encouraging pt to eat- pt got upset stating "you are all trying to make me do things that I don't want."

## 2021-12-31 NOTE — Plan of Care (Signed)
  Problem: Education: Goal: Ability to describe self-care measures that may prevent or decrease complications (Diabetes Survival Skills Education) will improve Outcome: Progressing   Problem: Nutritional: Goal: Maintenance of adequate nutrition will improve Outcome: Progressing   Problem: Skin Integrity: Goal: Risk for impaired skin integrity will decrease Outcome: Progressing   

## 2022-01-02 ENCOUNTER — Telehealth: Payer: Self-pay | Admitting: Hematology and Oncology

## 2022-01-02 NOTE — Telephone Encounter (Signed)
Scheduled appt per 9/16 referral. Pt is aware of appt date and time. Pt is aware to arrive 15 mins prior to appt time and to bring and updated insurance card. Pt is aware of appt location.

## 2022-01-12 ENCOUNTER — Other Ambulatory Visit (HOSPITAL_COMMUNITY): Payer: Self-pay | Admitting: Family Medicine

## 2022-01-24 ENCOUNTER — Other Ambulatory Visit: Payer: Self-pay

## 2022-01-24 ENCOUNTER — Inpatient Hospital Stay: Payer: Medicare Other

## 2022-01-24 ENCOUNTER — Encounter: Payer: Self-pay | Admitting: Hematology and Oncology

## 2022-01-24 ENCOUNTER — Inpatient Hospital Stay: Payer: Medicare Other | Attending: Hematology and Oncology | Admitting: Hematology and Oncology

## 2022-01-24 VITALS — BP 116/77 | HR 95 | Temp 98.0°F | Resp 18 | Ht 72.0 in | Wt 246.0 lb

## 2022-01-24 DIAGNOSIS — I2699 Other pulmonary embolism without acute cor pulmonale: Secondary | ICD-10-CM | POA: Insufficient documentation

## 2022-01-24 DIAGNOSIS — G8929 Other chronic pain: Secondary | ICD-10-CM | POA: Insufficient documentation

## 2022-01-24 DIAGNOSIS — Z85048 Personal history of other malignant neoplasm of rectum, rectosigmoid junction, and anus: Secondary | ICD-10-CM | POA: Diagnosis not present

## 2022-01-24 DIAGNOSIS — E119 Type 2 diabetes mellitus without complications: Secondary | ICD-10-CM | POA: Diagnosis not present

## 2022-01-24 DIAGNOSIS — R0789 Other chest pain: Secondary | ICD-10-CM | POA: Insufficient documentation

## 2022-01-24 DIAGNOSIS — C189 Malignant neoplasm of colon, unspecified: Secondary | ICD-10-CM

## 2022-01-24 DIAGNOSIS — Z9049 Acquired absence of other specified parts of digestive tract: Secondary | ICD-10-CM | POA: Diagnosis not present

## 2022-01-24 DIAGNOSIS — S301XXA Contusion of abdominal wall, initial encounter: Secondary | ICD-10-CM | POA: Insufficient documentation

## 2022-01-24 DIAGNOSIS — Z7901 Long term (current) use of anticoagulants: Secondary | ICD-10-CM | POA: Insufficient documentation

## 2022-01-24 DIAGNOSIS — Z87891 Personal history of nicotine dependence: Secondary | ICD-10-CM | POA: Diagnosis not present

## 2022-01-24 DIAGNOSIS — M549 Dorsalgia, unspecified: Secondary | ICD-10-CM | POA: Diagnosis not present

## 2022-01-24 DIAGNOSIS — Z79899 Other long term (current) drug therapy: Secondary | ICD-10-CM | POA: Insufficient documentation

## 2022-01-24 LAB — ANTITHROMBIN III: AntiThromb III Func: 121 % — ABNORMAL HIGH (ref 75–120)

## 2022-01-24 NOTE — Assessment & Plan Note (Signed)
This is a 61 year old male patient with previous history of metastatic colon cancer with no evidence of disease for the past several years, currently not on any kind of surveillance who was on Xarelto for blood clots about a year ago.  He most recently developed a saddle pulmonary embolism while on Xarelto hence was transitioned to Lovenox.  He is now referred to hematology for additional anticoagulation recommendations.  He today denies any new health complaints.  He has chronic back pain, groin pain which seems to be the most bothersome complaint.  Most of his conversation today with [place] is regarding this pain.  He also mentions some shortness of breath and intermittent chest pain which has not significantly improved since his recent hospitalization but has not gotten any worse.  He remains compliant with Lovenox.  He denies any family history of thromboembolic disorders.  Given his past medical history of metastatic colorectal cancer, have discussed about repeating a CT abdomen pelvis to evaluate for any possible recurrence.  In the absence of any metastatic malignancy, we have discussed about also considering hypercoagulable work-up since he had an episode of PE while on Xarelto.  Despite the cause of PE, I have discussed about lifelong anticoagulation with this patient.  He can also consider warfarin but this will need INR follow-up.  He at this time is reluctant to go to the clinic for INR follow-up hence he wants to stay on Lovenox.  If he decides to go on warfarin, his therapeutic range should be maintained between 2 and 3 and this can very well be managed by his primary care physician.  We will do a televisit in about 2 weeks to review his labs and the imaging results.  I have encouraged him to talk to his PCP as well as his pain medicine doctor for his pain needs.  Thank you for consulting [place] the care of this patient.  Please not hesitate to contact us with any additional questions or concerns.

## 2022-01-24 NOTE — Progress Notes (Signed)
[place] NOTE  Patient Care Team: [place], Julie M, NP as PCP - General (Nurse Practitioner)  CHIEF COMPLAINTS/PURPOSE OF CONSULTATION:  Saddle pulmonary embolism  ASSESSMENT & PLAN:  Pulmonary embolism [facility]) This is a 61 year old male patient with previous history of metastatic colon cancer with no evidence of disease for the past several years, currently not on any kind of surveillance who was on Xarelto for blood clots about a year ago.  He most recently developed a saddle pulmonary embolism while on Xarelto hence was transitioned to Lovenox.  He is now referred to hematology for additional anticoagulation recommendations.  He today denies any new health complaints.  He has chronic back pain, groin pain which seems to be the most bothersome complaint.  Most of his conversation today with [place] is regarding this pain.  He also mentions some shortness of breath and intermittent chest pain which has not significantly improved since his recent hospitalization but has not gotten any worse.  He remains compliant with Lovenox.  He denies any family history of thromboembolic disorders.  Given his past medical history of metastatic colorectal cancer, have discussed about repeating a CT abdomen pelvis to evaluate for any possible recurrence.  In the absence of any metastatic malignancy, we have discussed about also considering hypercoagulable work-up since he had an episode of PE while on Xarelto.  Despite the cause of PE, I have discussed about lifelong anticoagulation with this patient.  He can also consider warfarin but this will need INR follow-up.  He at this time is reluctant to go to the clinic for INR follow-up hence he wants to stay on Lovenox.  If he decides to go on warfarin, his therapeutic range should be maintained between 2 and 3 and this can very well be managed by his primary care physician.  We will do a televisit in about 2 weeks to review his labs and the imaging results.   I have encouraged him to talk to his PCP as well as his pain medicine doctor for his pain needs.  Thank you for consulting [place] the care of this patient.  Please not hesitate to contact us with any additional questions or concerns.  Orders Placed This Encounter  Procedures   CT Abdomen Pelvis W Contrast    Standing Status:   Future    Standing Expiration Date:   01/24/2023    Order Specific Question:   If indicated for the ordered procedure, I authorize the administration of contrast media per Radiology protocol    Answer:   Yes    Order Specific Question:   Preferred imaging location?    Answer:   [facility]    Order Specific Question:   Is Oral Contrast requested for this exam?    Answer:   Yes, Per Radiology protocol   Lupus anticoagulant panel    Standing Status:   Future    Number of Occurrences:   1    Standing Expiration Date:   01/25/2023   Prothrombin gene mutation    Standing Status:   Future    Number of Occurrences:   1    Standing Expiration Date:   01/25/2023   Factor 5 leiden    Standing Status:   Future    Number of Occurrences:   1    Standing Expiration Date:   01/25/2023   Hexagonal Phospholipid Neutralization    Standing Status:   Future    Number of Occurrences:   1  Standing Expiration Date:   01/25/2023   Cardiolipin antibodies, IgG, IgM, IgA    Standing Status:   Future    Number of Occurrences:   1    Standing Expiration Date:   01/25/2023   Antithrombin III    Standing Status:   Future    Number of Occurrences:   1    Standing Expiration Date:   01/25/2023   Protein C activity    Standing Status:   Future    Number of Occurrences:   1    Standing Expiration Date:   01/25/2023   Protein S activity    Standing Status:   Future    Number of Occurrences:   1    Standing Expiration Date:   01/25/2023   Beta-2-glycoprotein i abs, IgG/M/A    Standing Status:   Future    Number of Occurrences:   1    Standing Expiration Date:   01/24/2023      HISTORY OF PRESENTING ILLNESS:  ELIMELECH HOUSEMAN 61 y.o. male is here because of recent saddle pulmonary embolism  This is a 61 year old male patient previously followed by Dr. Beryle Beams and Dr. Simeon Craft such who was initially diagnosed with stage III colorectal cancer in 2001 received surgery, adjuvant chemo, was found to have liver metastasis in October 2002 underwent partial hepatectomy in [place] in February 2003.  He was subsequently on chemotherapy and maintenance Xeloda and finally discontinued on chemo since September 2003.  He was then followed with scans every 6 months for several years and did not have any evidence of recurrence.  He then saw Dr.Gorsuch back in 2015.  After that he has not been following up with medical oncology.  He most recently was found to have a saddle pulmonary embolism while taking Xarelto.  He denies any noncompliance.  He is very moody, arrived today with his wife.  It appears that based on records of previous conversations, he was also very moody during his previous visits.  He just went on and on about how his pain has not been managed well, he has chronic back pain, some groin pain.  He had to see many doctors before he finally established with pain management about 2 years ago.  He tells me that the care is fragmented and no one looks at him as a whole picture but only care about their particular speciality.  He insists that he is not a chronic narcotic user.  He had some injections to his back which helped him for temporary time but he mostly appears to have been bothered by the severe back pain.  He is currently on Lovenox for anticoagulation he has some abdominal bruising from it.  He is very reluctant to take warfarin since he needs INR monitoring.  He would rather stay on the injection.  Since his last visit, he denies any new complaints concerning for metastatic colorectal cancer.  In Dr. Alvy Bimler as note, there was also some mention of a kidney lesion but his  most recent imaging in September 2021 did not find any kidney lesions.  He denies any family history of blood clots or sudden cardiac deaths. Today he still complains of ongoing shortness of breath, some intermittent chest pain and limitation of activities given the symptoms.  He denies any bleeding complaints with the blood thinner.  Rest of the pertinent 10 point ROS reviewed and negative   MEDICAL HISTORY:  Past Medical History:  Diagnosis Date   Cancer ([place])  Colon cancer metastasized to liver ([place]) 01/05/2012   Colon carcinoma ([place])    colon ca dx 2001;   Diabetes mellitus without complication ([place])    Hypertension    Muscle weakness-general 01/05/2012    SURGICAL HISTORY: Past Surgical History:  Procedure Laterality Date   CHOLECYSTECTOMY     COLON SURGERY     sigmoid colectomy, appendectomy   INCISIONAL HERNIA REPAIR N/A 11/27/2017   Procedure: LAPAROSCOPIC ASSISTED INCISIONAL HERNIA;  Surgeon: Clovis Riley, MD;  Location: [facility];  Service: General;  Laterality: N/A;   INSERTION OF MESH N/A 11/27/2017   Procedure: INSERTION OF MESH;  Surgeon: Clovis Riley, MD;  Location: [facility];  Service: General;  Laterality: N/A;   LAPAROSCOPIC LYSIS OF ADHESIONS N/A 11/27/2017   Procedure: LAPAROSCOPIC LYSIS OF ADHESIONS;  Surgeon: Clovis Riley, MD;  Location: [facility];  Service: General;  Laterality: N/A;   LIVER SURGERY     partial removal [facility] in 2003   PORT-A-CATH REMOVAL Right 07/16/2012   Procedure: REMOVAL PORT-A-CATH;  Surgeon: Haywood Lasso, MD;  Location: [facility];  Service: General;  Laterality: Right;   RIGHT/LEFT HEART CATH AND CORONARY ANGIOGRAPHY N/A 04/01/2021   Procedure: RIGHT/LEFT HEART CATH AND CORONARY ANGIOGRAPHY;  Surgeon: Jolaine Artist, MD;  Location: [facility];  Service: Cardiovascular;  Laterality: N/A;    SOCIAL HISTORY: Social History   Socioeconomic History   Marital status: Single    Spouse name: Not on  file   Number of children: Not on file   Years of education: Not on file   Highest education level: Not on file  Occupational History   Not on file  Tobacco Use   Smoking status: Former    Packs/day: 0.50    Years: 32.00    Total pack years: 16.00    Types: Cigarettes    Quit date: 05/19/2015    Years since quitting: 6.6   Smokeless tobacco: Never  Vaping Use   Vaping Use: Never used  Substance and Sexual Activity   Alcohol use: No   Drug use: No   Sexual activity: Not on file  Other Topics Concern   Not on file  Social History Narrative   Not on file   Social Determinants of Health   Financial Resource Strain: Not on file  Food Insecurity: No Food Insecurity (12/30/2021)   Hunger Vital Sign    Worried About Running Out of Food in the Last Year: Never true    Ran Out of Food in the Last Year: Never true  Transportation Needs: No Transportation Needs (12/30/2021)   PRAPARE - Hydrologist (Medical): No    Lack of Transportation (Non-Medical): No  Physical Activity: Not on file  Stress: Not on file  Social Connections: Not on file  Intimate Partner Violence: Not At Risk (12/30/2021)   Humiliation, Afraid, Rape, and Kick questionnaire    Fear of Current or Ex-Partner: No    Emotionally Abused: No    Physically Abused: No    Sexually Abused: No    FAMILY HISTORY: History reviewed. No pertinent family history.  ALLERGIES:  has No Known Allergies.  MEDICATIONS:  Current Outpatient Medications  Medication Sig Dispense Refill   amiodarone (PACERONE) 200 MG tablet Take 200 mg by mouth daily.     carvedilol (COREG) 3.125 MG tablet Take 1 tablet (3.125 mg total) by mouth 2 (two) times daily. 60 tablet 11   cyclobenzaprine (FLEXERIL) 10  MG tablet Take 10 mg by mouth 3 (three) times daily as needed for muscle spasms.     digoxin (LANOXIN) 0.125 MG tablet Take 1 tablet (0.125 mg total) by mouth daily. 90 tablet 2   empagliflozin (JARDIANCE) 10 MG  TABS tablet Take 1 tablet (10 mg total) by mouth daily. 90 tablet 2   enoxaparin (LOVENOX) 120 MG/0.8ML injection Inject 0.8 mLs (120 mg total) into the skin 2 (two) times daily. 45.6 mL 1   furosemide (LASIX) 40 MG tablet Take 1 tablet (40 mg total) by mouth daily. Needs follow up appointment for further refills 90 tablet 0   gabapentin (NEURONTIN) 300 MG capsule Take 300 mg by mouth 2 (two) times daily.  6   KLOR-CON M20 20 MEQ tablet TAKE 1 TABLET BY MOUTH EVERY DAY 90 tablet 0   metFORMIN (GLUCOPHAGE-XR) 500 MG 24 hr tablet Take 1,000 mg by mouth 2 (two) times daily.  1   oxyCODONE-acetaminophen (PERCOCET) 7.5-325 MG tablet Take 1 tablet by mouth every 6 (six) hours as needed for severe pain (back pain).     OZEMPIC, 1 MG/DOSE, 4 MG/3ML SOPN Inject 1 mg into the skin every Monday.     rosuvastatin (CRESTOR) 20 MG tablet Take 20 mg by mouth daily.     sacubitril-valsartan (ENTRESTO) 97-103 MG Take 1 tablet by mouth 2 (two) times daily. 180 tablet 2   sildenafil (REVATIO) 20 MG tablet Take 20 mg by mouth daily as needed (ED).     spironolactone (ALDACTONE) 25 MG tablet Take 1 tablet (25 mg total) by mouth daily. 90 tablet 2   TRESIBA FLEXTOUCH 200 UNIT/ML FlexTouch Pen Inject 30 Units into the skin every evening.     No current facility-administered medications for this visit.     PHYSICAL EXAMINATION: ECOG PERFORMANCE STATUS: 0 - Asymptomatic  Vitals:   01/24/22 1406  BP: 116/77  Pulse: 95  Resp: 18  Temp: 98 F (36.7 C)  SpO2: 96%   Filed Weights   01/24/22 1406  Weight: 246 lb (111.6 kg)    Physical Exam Constitutional:      Appearance: Normal appearance.  Cardiovascular:     Rate and Rhythm: Normal rate and regular rhythm.     Pulses: Normal pulses.     Heart sounds: Normal heart sounds.  Pulmonary:     Effort: Pulmonary effort is normal.     Breath sounds: Normal breath sounds.  Abdominal:     General: Abdomen is flat. There is no distension.     Palpations:  Abdomen is soft. There is no mass.  Musculoskeletal:        General: Swelling present.     Cervical back: Normal range of motion and neck supple. No rigidity.  Lymphadenopathy:     Cervical: No cervical adenopathy.  Skin:    Coloration: Skin is not jaundiced.  Neurological:     Mental Status: He is alert.      LABORATORY DATA:  I have reviewed the data as listed Lab Results  Component Value Date   WBC 8.0 12/31/2021   HGB 15.5 12/31/2021   HCT 48.7 12/31/2021   MCV 83.0 12/31/2021   PLT 234 12/31/2021     Chemistry      Component Value Date/Time   NA 140 12/30/2021 0315   NA 139 01/01/2013 1328   K 3.7 12/30/2021 0315   K 4.1 01/01/2013 1328   CL 104 12/30/2021 0315   CL 102 07/01/2012 1439   CO2 26  12/30/2021 0315   CO2 27 01/01/2013 1328   BUN 10 12/30/2021 0315   BUN 14.5 01/01/2013 1328   CREATININE 1.19 12/30/2021 0315   CREATININE 1.2 01/01/2013 1328      Component Value Date/Time   CALCIUM 9.2 12/30/2021 0315   CALCIUM 9.5 01/01/2013 1328   ALKPHOS 43 12/30/2021 0315   ALKPHOS 63 01/01/2013 1328   AST 11 (L) 12/30/2021 0315   AST 40 (H) 01/01/2013 1328   ALT 14 12/30/2021 0315   ALT 27 01/01/2013 1328   BILITOT 1.7 (H) 12/30/2021 0315   BILITOT 1.39 (H) 01/01/2013 1328       RADIOGRAPHIC STUDIES: I have personally reviewed the radiological images as listed and agreed with the findings in the report. ECHOCARDIOGRAM COMPLETE  Result Date: 12/30/2021    ECHOCARDIOGRAM REPORT   Patient Name:   ALEXAVIER TSUTSUI Date of Exam: 12/30/2021 Medical Rec #:  [id number]        Height:       72.0 in Accession #:    [id number]       Weight:       260.0 lb Date of Birth:  [birth year]        BSA:          2.382 m Patient Age:    5 years         BP:           121/92 mmHg Patient Gender: M                HR:           87 bpm. Exam Location:  Inpatient Procedure: 2D Echo, Color Doppler, Cardiac Doppler and Intracardiac            Opacification Agent Indications:     Pulmonary embolus  History:        Patient has prior history of Echocardiogram examinations, most                 recent 07/15/2021. Risk Factors:Hypertension and Diabetes.  Sonographer:    Memory Argue Referring Phys: [id number] OLADAPO ADEFESO IMPRESSIONS  1. Left ventricular ejection fraction, by estimation, is 30 to 35%. The left ventricle has moderately decreased function. The left ventricle demonstrates regional wall motion abnormalities (see scoring diagram/findings for description). The left ventricular internal cavity size was mildly dilated. There is mild left ventricular hypertrophy. Left ventricular diastolic parameters are indeterminate.  2. Right ventricular systolic function is normal. The right ventricular size is normal.  3. Left atrial size was mild to moderately dilated.  4. The mitral valve is normal in structure. Trivial mitral valve regurgitation. No evidence of mitral stenosis.  5. The aortic valve was not well visualized. There is mild calcification of the aortic valve. Aortic valve regurgitation is trivial. Aortic valve sclerosis/calcification is present, without any evidence of aortic stenosis.  6. Aortic dilatation noted. There is mild dilatation of the ascending aorta, measuring 42 mm. Comparison(s): No significant change from prior study. Conclusion(s)/Recommendation(s): No left ventricular mural or apical thrombus/thrombi. FINDINGS  Left Ventricle: Left ventricular ejection fraction, by estimation, is 30 to 35%. The left ventricle has moderately decreased function. The left ventricle demonstrates regional wall motion abnormalities. Definity contrast agent was given IV to delineate the left ventricular endocardial borders. The left ventricular internal cavity size was mildly dilated. There is mild left ventricular hypertrophy. Left ventricular diastolic parameters are indeterminate. Right Ventricle: The right ventricular size is normal. Right vetricular wall thickness was not well  visualized. Right ventricular systolic function is normal. Left Atrium: Left atrial size was mild to moderately dilated. Right Atrium: Right atrial size was normal in size. Pericardium: There is no evidence of pericardial effusion. Mitral Valve: The mitral valve is normal in structure. Trivial mitral valve regurgitation. No evidence of mitral valve stenosis. Tricuspid Valve: The tricuspid valve is not well visualized. Tricuspid valve regurgitation is trivial. No evidence of tricuspid stenosis. Aortic Valve: The aortic valve was not well visualized. There is mild calcification of the aortic valve. Aortic valve regurgitation is trivial. Aortic valve sclerosis/calcification is present, without any evidence of aortic stenosis. Aortic valve mean gradient measures 2.0 mmHg. Aortic valve peak gradient measures 4.2 mmHg. Aortic valve area, by VTI measures 4.45 cm. Pulmonic Valve: The pulmonic valve was not well visualized. Pulmonic valve regurgitation is not visualized. Aorta: Aortic dilatation noted. There is mild dilatation of the ascending aorta, measuring 42 mm. Venous: The inferior vena cava was not well visualized. IAS/Shunts: The interatrial septum was not well visualized.  LEFT VENTRICLE PLAX 2D LVIDd:         5.10 cm      Diastology LVIDs:         4.30 cm      LV e' medial:    5.77 cm/s LV PW:         1.30 cm      LV E/e' medial:  8.2 LV IVS:        1.40 cm      LV e' lateral:   6.09 cm/s LVOT diam:     2.70 cm      LV E/e' lateral: 7.7 LV SV:         76 LV SV Index:   32 LVOT Area:     5.73 cm  LV Volumes (MOD) LV vol d, MOD A2C: 166.0 ml LV vol d, MOD A4C: 189.0 ml LV vol s, MOD A2C: 108.0 ml LV vol s, MOD A4C: 124.0 ml LV SV MOD A2C:     58.0 ml LV SV MOD A4C:     189.0 ml LV SV MOD BP:      64.5 ml RIGHT VENTRICLE TAPSE (M-mode): 2.3 cm LEFT ATRIUM             Index        RIGHT ATRIUM           Index LA Vol (A2C):   66.9 ml 28.09 ml/m  RA Area:     12.50 cm LA Vol (A4C):   68.8 ml 28.88 ml/m  RA Volume:    24.90 ml  10.45 ml/m LA Biplane Vol: 72.3 ml 30.35 ml/m  AORTIC VALVE AV Area (Vmax):    4.29 cm AV Area (Vmean):   4.33 cm AV Area (VTI):     4.45 cm AV Vmax:           102.00 cm/s AV Vmean:          67.900 cm/s AV VTI:            0.171 m AV Peak Grad:      4.2 mmHg AV Mean Grad:      2.0 mmHg LVOT Vmax:         76.50 cm/s LVOT Vmean:        51.400 cm/s LVOT VTI:          0.133 m LVOT/AV VTI ratio: 0.78  AORTA Ao Root diam: 4.10 cm Ao Asc diam:  4.20 cm MITRAL VALVE  MV Area (PHT): 3.77 cm    SHUNTS MV Decel Time: 201 msec    Systemic VTI:  0.13 m MV E velocity: 47.10 cm/s  Systemic Diam: 2.70 cm MV A velocity: 91.20 cm/s MV E/A ratio:  0.52 Buford Dresser MD Electronically signed by Buford Dresser MD Signature Date/Time: 12/30/2021/3:07:26 PM    Final    VAS [place] LOWER EXTREMITY VENOUS (DVT)  Result Date: 12/30/2021  Lower Venous DVT Study Patient Name:  JOSEMANUEL EAKINS  Date of Exam:   12/29/2021 Medical Rec #: [id number]         Accession #:    [id number] Date of Birth: 06-16-1960         Patient Gender: M Patient Age:   60 years Exam Location:  [facility] Procedure:      VAS [place] LOWER EXTREMITY VENOUS (DVT) Referring Phys: Ellwood Handler --------------------------------------------------------------------------------  Indications: Patient presents with swelling and pain of the left calf. He reports that it has been that way for some while but the swelling has been more noticeable this week.  Risk Factors: Cancer Colon cancer DVT History of DVT. Anticoagulation: Xarelto. Performing Technologist: Mariane Masters RVT  Examination Guidelines: A complete evaluation includes B-mode imaging, spectral Doppler, color Doppler, and power Doppler as needed of all accessible portions of each vessel. Bilateral testing is considered an integral part of a complete examination. Limited examinations for reoccurring indications may be performed as noted. The reflux portion of the exam is performed with the patient  in reverse Trendelenburg.  +-----+---------------+---------+-----------+----------+--------------+ RIGHTCompressibilityPhasicitySpontaneityPropertiesThrombus Aging +-----+---------------+---------+-----------+----------+--------------+ CFV  Full           Yes      Yes                                 +-----+---------------+---------+-----------+----------+--------------+   +---------+---------------+---------+-----------+----------------+-------------+ LEFT     CompressibilityPhasicitySpontaneityProperties      Thrombus                                                                  Aging         +---------+---------------+---------+-----------+----------------+-------------+ CFV      Full           Yes      Yes                                      +---------+---------------+---------+-----------+----------------+-------------+ SFJ      Full           Yes      Yes                                      +---------+---------------+---------+-----------+----------------+-------------+ FV Prox  Full           Yes      Yes                                      +---------+---------------+---------+-----------+----------------+-------------+ FV Mid   Full  Yes      Yes                                      +---------+---------------+---------+-----------+----------------+-------------+ FV DistalFull           Yes      Yes                                      +---------+---------------+---------+-----------+----------------+-------------+ PFV      Full                                                             +---------+---------------+---------+-----------+----------------+-------------+ POP      Full           Yes      Yes                                      +---------+---------------+---------+-----------+----------------+-------------+ PTV      Full           Yes      Yes                                       +---------+---------------+---------+-----------+----------------+-------------+ PERO     None           No       No         softly echogenicAcute         +---------+---------------+---------+-----------+----------------+-------------+ Gastroc  Partial        No       No         spongy          Acute                                                     w/compression                 +---------+---------------+---------+-----------+----------------+-------------+   Findings reported to [place], PA-C at 4:00 pm.  Summary: RIGHT: - No evidence of common femoral vein obstruction.  LEFT: - Findings consistent with acute deep vein thrombosis involving the left peroneal veins, and left gastrocnemius veins. - No cystic structure found in the popliteal fossa. - All other veins visualized appear fully compressible and demonstrate appropriate Doppler characteristics.  *See table(s) above for measurements and observations. Electronically signed by Larae Grooms MD on 12/30/2021 at 12:49:39 PM.    Final    CT Angio Chest PE W/Cm &/Or Wo Cm  Result Date: 12/29/2021 CLINICAL DATA:  Pulmonary embolism (PE) suspected, high prob.  DVT. EXAM: CT ANGIOGRAPHY CHEST WITH CONTRAST TECHNIQUE: Multidetector CT imaging of the chest was performed using the standard protocol during bolus administration of intravenous contrast. Multiplanar CT image reconstructions and MIPs were obtained to evaluate the vascular anatomy. RADIATION DOSE REDUCTION: This exam  was performed according to the departmental dose-optimization program which includes automated exposure control, adjustment of the mA and/or kV according to patient size and/or use of iterative reconstruction technique. CONTRAST:  16m OMNIPAQUE IOHEXOL 350 MG/ML SOLN COMPARISON:  11/08/2021 FINDINGS: Cardiovascular: There is a saddle embolus extending into branches in the upper and lower lobes of both lungs. No evidence of right heart strain. Heart is borderline  in size. Aorta normal caliber. Mediastinum/Nodes: No mediastinal, hilar, or axillary adenopathy. Trachea and esophagus are unremarkable. Thyroid unremarkable. Lungs/Pleura: Lungs are clear. No focal airspace opacities or suspicious nodules. No effusions. Upper Abdomen: No acute findings.  Prior partial hepatectomy. Musculoskeletal: Chest wall soft tissues are unremarkable. No acute bony abnormality. Review of the MIP images confirms the above findings. IMPRESSION: Saddle embolus extends into pulmonary arteries in both upper and lower lobes bilaterally. No evidence of right heart strain. These results were called by telephone at the time of interpretation on 12/29/2021 at 7:41 pm to provider [place], who verbally acknowledged these results. Electronically Signed   By: KRolm BaptiseM.D.   On: 12/29/2021 19:42    All questions were answered. The patient knows to call the clinic with any problems, questions or concerns. I spent 60 minutes in the care of this patient including H and P, review of records, counseling and coordination of care.     PBenay Pike MD 01/24/2022 4:53 PM

## 2022-01-25 ENCOUNTER — Ambulatory Visit (HOSPITAL_COMMUNITY)
Admission: RE | Admit: 2022-01-25 | Discharge: 2022-01-25 | Disposition: A | Discharge status: Home / Self Care | Payer: Medicare Other | Source: Ambulatory Visit | Attending: Internal Medicine | Admitting: Internal Medicine

## 2022-01-25 VITALS — BP 120/80 | HR 93 | Wt 258.0 lb

## 2022-01-25 DIAGNOSIS — M549 Dorsalgia, unspecified: Secondary | ICD-10-CM | POA: Insufficient documentation

## 2022-01-25 DIAGNOSIS — R5381 Other malaise: Secondary | ICD-10-CM

## 2022-01-25 DIAGNOSIS — I110 Hypertensive heart disease with heart failure: Secondary | ICD-10-CM | POA: Diagnosis present

## 2022-01-25 DIAGNOSIS — Z86718 Personal history of other venous thrombosis and embolism: Secondary | ICD-10-CM | POA: Diagnosis not present

## 2022-01-25 DIAGNOSIS — I340 Nonrheumatic mitral (valve) insufficiency: Secondary | ICD-10-CM | POA: Insufficient documentation

## 2022-01-25 DIAGNOSIS — I714 Abdominal aortic aneurysm, without rupture, unspecified: Secondary | ICD-10-CM | POA: Diagnosis not present

## 2022-01-25 DIAGNOSIS — I5022 Chronic systolic (congestive) heart failure: Secondary | ICD-10-CM | POA: Diagnosis not present

## 2022-01-25 DIAGNOSIS — G8929 Other chronic pain: Secondary | ICD-10-CM | POA: Diagnosis not present

## 2022-01-25 DIAGNOSIS — Z7984 Long term (current) use of oral hypoglycemic drugs: Secondary | ICD-10-CM | POA: Insufficient documentation

## 2022-01-25 DIAGNOSIS — Z7901 Long term (current) use of anticoagulants: Secondary | ICD-10-CM | POA: Diagnosis not present

## 2022-01-25 DIAGNOSIS — I2510 Atherosclerotic heart disease of native coronary artery without angina pectoris: Secondary | ICD-10-CM | POA: Insufficient documentation

## 2022-01-25 DIAGNOSIS — C787 Secondary malignant neoplasm of liver and intrahepatic bile duct: Secondary | ICD-10-CM | POA: Diagnosis not present

## 2022-01-25 DIAGNOSIS — Z87891 Personal history of nicotine dependence: Secondary | ICD-10-CM | POA: Insufficient documentation

## 2022-01-25 DIAGNOSIS — E785 Hyperlipidemia, unspecified: Secondary | ICD-10-CM | POA: Diagnosis not present

## 2022-01-25 DIAGNOSIS — Z86711 Personal history of pulmonary embolism: Secondary | ICD-10-CM | POA: Diagnosis not present

## 2022-01-25 DIAGNOSIS — I5042 Chronic combined systolic (congestive) and diastolic (congestive) heart failure: Secondary | ICD-10-CM

## 2022-01-25 DIAGNOSIS — Z9221 Personal history of antineoplastic chemotherapy: Secondary | ICD-10-CM | POA: Diagnosis not present

## 2022-01-25 DIAGNOSIS — I7121 Aneurysm of the ascending aorta, without rupture: Secondary | ICD-10-CM | POA: Diagnosis not present

## 2022-01-25 DIAGNOSIS — Z79899 Other long term (current) drug therapy: Secondary | ICD-10-CM | POA: Diagnosis not present

## 2022-01-25 LAB — BETA-2-GLYCOPROTEIN I ABS, IGG/M/A
Beta-2 Glyco I IgG: <9 GPI IgG units (ref 0–20)
Beta-2-Glycoprotein I IgA: <9 GPI IgA units (ref 0–25)
Beta-2-Glycoprotein I IgM: <9 GPI IgM units (ref 0–32)

## 2022-01-25 LAB — CARDIOLIPIN ANTIBODIES, IGG, IGM, IGA
Anticardiolipin IgA: <9 APL U/mL (ref 0–11)
Anticardiolipin IgG: <9 GPL U/mL (ref 0–14)
Anticardiolipin IgM: <9 MPL U/mL (ref 0–12)

## 2022-01-25 LAB — LUPUS ANTICOAGULANT PANEL
DRVVT: 58.6 s — ABNORMAL HIGH (ref 0.0–47.0)
PTT Lupus Anticoagulant: 38.3 s (ref 0.0–43.5)

## 2022-01-25 LAB — DRVVT CONFIRM: dRVVT Confirm: 1.3 ratio — ABNORMAL HIGH (ref 0.8–1.2)

## 2022-01-25 LAB — PROTEIN S ACTIVITY: Protein S Activity: 114 % (ref 63–140)

## 2022-01-25 LAB — PROTEIN C ACTIVITY: Protein C Activity: >199 % — ABNORMAL HIGH (ref 73–180)

## 2022-01-25 LAB — DRVVT MIX: dRVVT Mix: 45.8 s — ABNORMAL HIGH (ref 0.0–40.4)

## 2022-01-25 NOTE — Progress Notes (Addendum)
ADVANCED HF CLINIC NOTE   Primary Care: Martinique, Julie M, NP HF Cardiologist: Dr. Haroldine Laws  HPI:  Raymond Allen is a 61 y.o. AAM w/ remote h/o stage III colon cancer, diagnosed in October 2001, HTN, Type 2DM, HLD, former smoker, h/o LLE DVT on Xarelto and systolic HF/ NICM.   He received initial adjuvant chemotherapy and underwent partial colectomy. He had mets to liver in October 2002. He underwent partial hepatectomy at [facility] in February 2003. He received neoadjuvant chemotherapy with FOLFIRI then adjuvant FOLFOX following the surgery. He was put on maintenance Xeloda. He has been off all chemotherapy since September of 2003.   Admitted 12/22 with new CHF and acute hypoxic respiratory failure, requiring intubation. Echo LVEF 25-30% w/ global HK, RV mildly reduced Mod-severe MR. R/LHC 12/22  minimal CAD. Etiology for CM unclear. Hospitalization c/b sepsis 2/2 PNA, atrial tachycardia with RVR, and AKI  Admitted 12/29/21 with leg swelling and SOB. Found to have acute left lower extremity DVT and saddle pulmonary embolus on CT imaging.  Xarelto stopped and switched to Lovenox  Echo 12/30/21: EF 30-35% RV ok   Today he returns for f/u with his wife. Feels rundown. Continues to be markedly limited by back pain and leg weakness. No CP, orthopnea or PND.   Echo 07/15/21 EF 30-35% mild MR Personally reviewed  Cardiac Studies - Echo (12/22): biventricular dysfunction. LVEF severely reduced 25-30% w/ global HK, RV mildly reduced , mildly elevated RVSP 38 mmH, Mod-severe MR.   - R/LHC (12/22):      Prox RCA to Mid RCA lesion is 40% stenosed.   Prox Cx to Mid Cx lesion is 50% stenosed.   Mid LAD lesion is 30% stenosed.   Findings: Ao = 61/48 (52) -> NE restarted  LV = 94/27 -> on NE RA =  8 RV = 27/8 PA = 29/12 (22) PCW = 14 Fick cardiac output/index = 6.5/2.7 (prior to NE) PVR = 1.2 WU SVR = 539 Ao sat = 95% PA sat = 66%, 64% PAPi = 2.1   Past Medical History:  Diagnosis Date    Cancer ([place])    Colon cancer metastasized to liver ([place]) 01/05/2012   Colon carcinoma ([place])    colon ca dx 2001;   Diabetes mellitus without complication (HCC)    Hypertension    Muscle weakness-general 01/05/2012   Current Outpatient Medications  Medication Sig Dispense Refill   amiodarone (PACERONE) 200 MG tablet Take 200 mg by mouth daily.     carvedilol (COREG) 3.125 MG tablet Take 1 tablet (3.125 mg total) by mouth 2 (two) times daily. 60 tablet 11   cyclobenzaprine (FLEXERIL) 10 MG tablet Take 10 mg by mouth 3 (three) times daily as needed for muscle spasms.     digoxin (LANOXIN) 0.125 MG tablet Take 1 tablet (0.125 mg total) by mouth daily. 90 tablet 2   empagliflozin (JARDIANCE) 10 MG TABS tablet Take 1 tablet (10 mg total) by mouth daily. 90 tablet 2   enoxaparin (LOVENOX) 120 MG/0.8ML injection Inject 0.8 mLs (120 mg total) into the skin 2 (two) times daily. 45.6 mL 1   furosemide (LASIX) 40 MG tablet Take 1 tablet (40 mg total) by mouth daily. Needs follow up appointment for further refills 90 tablet 0   gabapentin (NEURONTIN) 300 MG capsule Take 300 mg by mouth 2 (two) times daily.  6   KLOR-CON M20 20 MEQ tablet TAKE 1 TABLET BY MOUTH EVERY DAY 90 tablet 0   metFORMIN (GLUCOPHAGE-XR)  500 MG 24 hr tablet Take 1,000 mg by mouth 2 (two) times daily.  1   oxyCODONE-acetaminophen (PERCOCET) 7.5-325 MG tablet Take 1 tablet by mouth every 6 (six) hours as needed for severe pain (back pain).     OZEMPIC, 1 MG/DOSE, 4 MG/3ML SOPN Inject 1 mg into the skin every Monday.     rosuvastatin (CRESTOR) 20 MG tablet Take 20 mg by mouth daily.     sacubitril-valsartan (ENTRESTO) 97-103 MG Take 1 tablet by mouth 2 (two) times daily. 180 tablet 2   spironolactone (ALDACTONE) 25 MG tablet Take 1 tablet (25 mg total) by mouth daily. 90 tablet 2   TRESIBA FLEXTOUCH 200 UNIT/ML FlexTouch Pen Inject 30 Units into the skin every evening.     sildenafil (REVATIO) 20 MG tablet Take 20 mg by mouth daily as  needed (ED). (Patient not taking: Reported on 01/25/2022)     No current facility-administered medications for this encounter.   No Known Allergies  Social History   Socioeconomic History   Marital status: Single    Spouse name: Not on file   Number of children: Not on file   Years of education: Not on file   Highest education level: Not on file  Occupational History   Not on file  Tobacco Use   Smoking status: Former    Packs/day: 0.50    Years: 32.00    Total pack years: 16.00    Types: Cigarettes    Quit date: 05/19/2015    Years since quitting: 6.6   Smokeless tobacco: Never  Vaping Use   Vaping Use: Never used  Substance and Sexual Activity   Alcohol use: No   Drug use: No   Sexual activity: Not on file  Other Topics Concern   Not on file  Social History Narrative   Not on file   Social Determinants of Health   Financial Resource Strain: Not on file  Food Insecurity: No Food Insecurity (12/30/2021)   Hunger Vital Sign    Worried About Running Out of Food in the Last Year: Never true    Ran Out of Food in the Last Year: Never true  Transportation Needs: No Transportation Needs (12/30/2021)   PRAPARE - Hydrologist (Medical): No    Lack of Transportation (Non-Medical): No  Physical Activity: Not on file  Stress: Not on file  Social Connections: Not on file  Intimate Partner Violence: Not At Risk (12/30/2021)   Humiliation, Afraid, Rape, and Kick questionnaire    Fear of Current or Ex-Partner: No    Emotionally Abused: No    Physically Abused: No    Sexually Abused: No    No family history on file.  BP 120/80   Pulse 93   Wt 117 kg (258 lb)   SpO2 96%   BMI 34.99 kg/m   Wt Readings from Last 3 Encounters:  01/25/22 117 kg (258 lb)  01/24/22 111.6 kg (246 lb)  12/31/21 115.1 kg (253 lb 12.8 oz)   PHYSICAL EXAM: General:  Weak appearing. No resp difficulty HEENT: normal Neck: supple. no JVD. Carotids 2+ bilat; no bruits.  No lymphadenopathy or thryomegaly appreciated. Cor: PMI nondisplaced. Regular rate & rhythm. No rubs, gallops or murmurs. Lungs: clear Abdomen: obese soft, nontender, nondistended. No hepatosplenomegaly. No bruits or masses. Good bowel sounds. Extremities: no cyanosis, clubbing, rash, edema Neuro: alert & orientedx3, cranial nerves grossly intact. moves all 4 extremities w/o difficulty. Affect pleasant   ASSESSMENT &  PLAN:   1. Chronic Systolic Heart Failure - Echo 12/22 LVEF 25-30%, diffuse HK, RV mildly reduced. No prior study for comparison - Etiology uncertain. H/o chemotherapy w/ Fluoropyrimidines (potentially cardiotoxic) but this was remotely. Off chemo since 2003 - R/LHC 12/22 minimal CAD. Preserved CO. Low SVR - cMRI (12/22): LVEF 34%, RVEF 42%, mild MR, significantly dilated pulm artery 40 mm, AAA 45 mm - Echo 07/15/21 EF 30-35% mild MR  - Echo 12/30/21 EF 30-35% AscAo 4.2cm - NYHA III-IIIB, functional class confounded by physical deconditioning and chronic pain. - Volume status normal ReDS 27% - Continue carvedilol 3.125 mg bid. - Continue Entresto 97/103 mg bid. - Continue Jardiance 10 mg daily. - Continue spiro 25 mg daily. - Continue digoxin 0.125 mg daily. - Labs today   2. Mitral Regurgitation  - Mod-severe on echo 12/22. No significant v-waves in PCWP tracing on cath. - Suspect functional in setting of severely dilated LV.  - Mild on cMRI. Mild on echo  3. Ascending aortic aneurysm - 4.5 cm on MRI - On echo 9/23 4.2 cm   4. Type 2DM  - Hgb A1c 7.2  - Continue Farxiga.   5. Atrial Tach vs AFlutter w/ RVR - Regular on exam today. - CHA2DS2-VASc Score = 4   - Continue amio 200 mg daily  - On Xarelto 20. No bleeding issues.  6. Morbid obesity - needs weight loss - consider GLP1RA  7. Back pain - continue to follow with pain clinic  8. DVT with saddle PE 9/23 - failed Xarelto - now on lovenox  9. Physical deconditioning - refer to PT    Glori Bickers, MD  12:29 PM 01/25/22

## 2022-01-25 NOTE — Progress Notes (Signed)
ReDS Vest / Clip - 01/25/22 1300       ReDS Vest / Clip   Station Marker C    Ruler Value 28    ReDS Value Range Low volume    ReDS Actual Value 28

## 2022-01-25 NOTE — Patient Instructions (Signed)
Medication Changes:  NONE, continue current medications  Lab Work:  none  Testing/Procedures:  Your physician has requested that you have an echocardiogram. Echocardiography is a painless test that uses sound waves to create images of your heart. It provides your doctor with information about the size and shape of your heart and how well your heart's chambers and valves are working. This procedure takes approximately one hour. There are no restrictions for this procedure. IN 6 MONTHS WITH FOLLOW UP  Referrals:  You have been referred to Physical Therapy, they will call you to schedule  Special Instructions // Education:  Do the following things EVERYDAY: Weigh yourself in the morning before breakfast. Write it down and keep it in a log. Take your medicines as prescribed Eat low salt foods--Limit salt (sodium) to 2000 mg per day.  Stay as active as you can everyday Limit all fluids for the day to less than 2 liters   Follow-Up in: 6 months with an echocardiogram (April 2024), **PLEASE CALL OUR OFFICE IN FEBRUARY TO SCHEDULE THIS APPOINTMENT   At the [facility], you and your health needs are our priority. We have a designated team specialized in the treatment of Heart Failure. This Care Team includes your primary Heart Failure Specialized Cardiologist (physician), Advanced Practice Providers (APPs- Physician Assistants and Nurse Practitioners), and Pharmacist who all work together to provide you with the care you need, when you need it.   You may see any of the following providers on your designated Care Team at your next follow up:  Dr. Glori Bickers Dr. Loralie Champagne Dr. Roxana Hires, NP Lyda Jester, [place] [facility] [place], [place] Forestine Na, NP Audry Riles, PharmD   Please be sure to bring in all your medications bottles to every appointment.   Need to Contact us:  If you have any questions or concerns before your next  appointment please send [place] a message through [place] or call our office at [phone].    TO LEAVE A MESSAGE FOR THE NURSE SELECT OPTION 2, PLEASE LEAVE A MESSAGE INCLUDING: YOUR NAME DATE OF BIRTH CALL BACK NUMBER REASON FOR CALL**this is important as we prioritize the call backs  YOU WILL RECEIVE A CALL BACK THE SAME DAY AS LONG AS YOU CALL BEFORE 4:00 PM

## 2022-01-26 ENCOUNTER — Telehealth: Payer: Self-pay | Admitting: *Deleted

## 2022-01-26 NOTE — Telephone Encounter (Addendum)
-----   Message from Benay Pike, MD sent at 01/26/2022 12:50 PM EDT ----- All labs so far unremarkable except for one test which may be false positive because of heparin on board. My recommendations for blood thinners wont change, he needs to take warfarin or lovenox indefinitely.   This RN called pt and spoke with his finance ( pt not available ) and informed her per request - of MD's evaluation of labs and recommendation of need to stay on blood thinners indefinitely.

## 2022-01-30 LAB — HEXAGONAL PHOSPHOLIPID NEUTRALIZATION: Hexagonal Phospholipid Neutral: 7 s

## 2022-01-30 LAB — FACTOR 5 LEIDEN

## 2022-01-30 LAB — PROTHROMBIN GENE MUTATION

## 2022-02-01 ENCOUNTER — Ambulatory Visit (HOSPITAL_COMMUNITY)
Admission: RE | Admit: 2022-02-01 | Discharge: 2022-02-01 | Disposition: A | Discharge status: Home / Self Care | Payer: Medicare Other | Source: Ambulatory Visit | Attending: Hematology and Oncology | Admitting: Hematology and Oncology

## 2022-02-01 DIAGNOSIS — C787 Secondary malignant neoplasm of liver and intrahepatic bile duct: Secondary | ICD-10-CM | POA: Diagnosis present

## 2022-02-01 DIAGNOSIS — C189 Malignant neoplasm of colon, unspecified: Secondary | ICD-10-CM | POA: Diagnosis present

## 2022-02-01 DIAGNOSIS — I2699 Other pulmonary embolism without acute cor pulmonale: Secondary | ICD-10-CM | POA: Insufficient documentation

## 2022-02-01 MED ORDER — IOHEXOL 300 MG/ML  SOLN
100.0000 mL | Freq: Once | INTRAMUSCULAR | Status: AC | PRN
  Administered 2022-02-01: 100 mL via INTRAVENOUS

## 2022-02-01 MED ORDER — IOHEXOL 9 MG/ML PO SOLN
1000.0000 mL | Freq: Once | ORAL | Status: AC
  Administered 2022-02-01: 1000 mL via ORAL
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
## 2022-02-02 ENCOUNTER — Ambulatory Visit: Payer: Medicare Other | Attending: Internal Medicine

## 2022-02-02 VITALS — BP 111/85 | HR 94

## 2022-02-02 DIAGNOSIS — R5381 Other malaise: Secondary | ICD-10-CM | POA: Diagnosis not present

## 2022-02-02 DIAGNOSIS — M6281 Muscle weakness (generalized): Secondary | ICD-10-CM | POA: Insufficient documentation

## 2022-02-02 DIAGNOSIS — R262 Difficulty in walking, not elsewhere classified: Secondary | ICD-10-CM | POA: Diagnosis present

## 2022-02-02 DIAGNOSIS — M5459 Other low back pain: Secondary | ICD-10-CM | POA: Diagnosis not present

## 2022-02-02 DIAGNOSIS — R2689 Other abnormalities of gait and mobility: Secondary | ICD-10-CM | POA: Diagnosis present

## 2022-02-03 ENCOUNTER — Other Ambulatory Visit: Payer: Self-pay

## 2022-02-08 ENCOUNTER — Inpatient Hospital Stay (HOSPITAL_BASED_OUTPATIENT_CLINIC_OR_DEPARTMENT_OTHER): Payer: Medicare Other | Admitting: Hematology and Oncology

## 2022-02-08 DIAGNOSIS — I2699 Other pulmonary embolism without acute cor pulmonale: Secondary | ICD-10-CM | POA: Diagnosis not present

## 2022-02-08 NOTE — Progress Notes (Signed)
[facility] NOTE  Patient Care Team: Martinique, Julie M, NP as PCP - General (Nurse Practitioner)  CHIEF COMPLAINTS/PURPOSE OF CONSULTATION:  Saddle pulmonary embolism  ASSESSMENT & PLAN:  Pulmonary embolism [facility]) This is a 61 year old male patient with previous history of metastatic colon cancer with no evidence of disease for the past several years, currently not on any kind of surveillance who was on Xarelto for blood clots about a year ago.  He most recently developed a saddle pulmonary embolism while on Xarelto hence was transitioned to Lovenox.  He is now referred to hematology for additional anticoagulation recommendations.   Given his past medical history of metastatic colorectal cancer, have discussed about repeating a CT abdomen pelvis to evaluate for any possible recurrence. This didn't show any evidence of recurrence. Despite the cause of PE, I have discussed about lifelong anticoagulation with this patient.  I discussed about warfarin but he was reluctant during his last visit.  He is here for telephone visit. We discussed that his hypercoagulable work up didn't show any clear evidence of clotting disorder. He has abnormal LA but this is not a reliable test while on lovenox. He then told me he would like to try a pill. I mentioned to him that we will contact his PCP's office to see if they can kindly manage his INR's since he may not have to follow up wth [place]. He will need life long anticoagulation with therapeutic INR of 2-3.  He was ok with this plan. RTC with [place] as needed.  No orders of the defined types were placed in this encounter.    HISTORY OF PRESENTING ILLNESS:  ROGEN Allen 61 y.o. male is here because of recent saddle pulmonary embolism  This is a 61 year old male patient previously followed by Dr. Beryle Beams and Dr. Alvy Bimler who was initially diagnosed with stage III colorectal cancer in 2001 received surgery, adjuvant chemo, was found to  have liver metastasis in October 2002 underwent partial hepatectomy in [place] in February 2003.  He was subsequently on chemotherapy and maintenance Xeloda and finally discontinued on chemo since September 2003.  He was then followed with scans every 6 months for several years and did not have any evidence of recurrence.  He then saw Dr.Gorsuch back in 2015.  After that he has not been following up with medical oncology.  He most recently was found to have a saddle pulmonary embolism while taking Xarelto.  He denies any noncompliance.  He is very moody, arrived today with his wife.  It appears that based on records of previous conversations, he was also very moody during his previous visits.  He just went on and on about how his pain has not been managed well, he has chronic back pain, some groin pain.  He had to see many doctors before he finally established with pain management about 2 years ago.  He tells me that the care is fragmented and no one looks at him as a whole picture but only care about their particular speciality.  He insists that he is not a chronic narcotic user.  He had some injections to his back which helped him for temporary time but he mostly appears to have been bothered by the severe back pain.  He is currently on Lovenox for anticoagulation he has some abdominal bruising from it.  He is very reluctant to take warfarin since he needs INR monitoring.  He would rather stay on the injection.  Since his last  visit, he denies any new complaints concerning for metastatic colorectal cancer.  In Dr. Calton Dach note, there was also some mention of a kidney lesion but his most recent imaging in September 2021 did not find any kidney lesions.  Interval history  He is here for  telephone visit to review labs. He says he is making it ok, he is however getting tired of the lovenox injections and hoping to tak e apill. No worsening SOB.   MEDICAL HISTORY:  Past Medical History:  Diagnosis Date    Cancer [facility])    Colon cancer metastasized to liver ([place]) 01/05/2012   Colon carcinoma ([place])    colon ca dx 2001;   Diabetes mellitus without complication ([place])    Hypertension    Muscle weakness-general 01/05/2012    SURGICAL HISTORY: Past Surgical History:  Procedure Laterality Date   CHOLECYSTECTOMY     COLON SURGERY     sigmoid colectomy, appendectomy   INCISIONAL HERNIA REPAIR N/A 11/27/2017   Procedure: LAPAROSCOPIC ASSISTED INCISIONAL HERNIA;  Surgeon: Clovis Riley, MD;  Location: [facility];  Service: General;  Laterality: N/A;   INSERTION OF MESH N/A 11/27/2017   Procedure: INSERTION OF MESH;  Surgeon: Clovis Riley, MD;  Location: [facility];  Service: General;  Laterality: N/A;   LAPAROSCOPIC LYSIS OF ADHESIONS N/A 11/27/2017   Procedure: LAPAROSCOPIC LYSIS OF ADHESIONS;  Surgeon: Clovis Riley, MD;  Location: [facility];  Service: General;  Laterality: N/A;   LIVER SURGERY     partial removal [facility] in 2003   PORT-A-CATH REMOVAL Right 07/16/2012   Procedure: REMOVAL PORT-A-CATH;  Surgeon: Haywood Lasso, MD;  Location: [place];  Service: General;  Laterality: Right;   RIGHT/LEFT HEART CATH AND CORONARY ANGIOGRAPHY N/A 04/01/2021   Procedure: RIGHT/LEFT HEART CATH AND CORONARY ANGIOGRAPHY;  Surgeon: Jolaine Artist, MD;  Location: [facility];  Service: Cardiovascular;  Laterality: N/A;    SOCIAL HISTORY: Social History   Socioeconomic History   Marital status: Single    Spouse name: Not on file   Number of children: Not on file   Years of education: Not on file   Highest education level: Not on file  Occupational History   Not on file  Tobacco Use   Smoking status: Former    Packs/day: 0.50    Years: 32.00    Total pack years: 16.00    Types: Cigarettes    Quit date: 05/19/2015    Years since quitting: 6.7   Smokeless tobacco: Never  Vaping Use   Vaping Use: Never used  Substance and Sexual Activity   Alcohol use: No   Drug use:  No   Sexual activity: Not on file  Other Topics Concern   Not on file  Social History Narrative   Not on file   Social Determinants of Health   Financial Resource Strain: Not on file  Food Insecurity: No Food Insecurity (12/30/2021)   Hunger Vital Sign    Worried About Running Out of Food in the Last Year: Never true    Ran Out of Food in the Last Year: Never true  Transportation Needs: No Transportation Needs (12/30/2021)   PRAPARE - Hydrologist (Medical): No    Lack of Transportation (Non-Medical): No  Physical Activity: Not on file  Stress: Not on file  Social Connections: Not on file  Intimate Partner Violence: Not At Risk (12/30/2021)   Humiliation, Afraid, Rape, and Kick questionnaire  Fear of Current or Ex-Partner: No    Emotionally Abused: No    Physically Abused: No    Sexually Abused: No    FAMILY HISTORY: No family history on file.  ALLERGIES:  has No Known Allergies.  MEDICATIONS:  Current Outpatient Medications  Medication Sig Dispense Refill   amiodarone (PACERONE) 200 MG tablet Take 200 mg by mouth daily.     carvedilol (COREG) 3.125 MG tablet Take 1 tablet (3.125 mg total) by mouth 2 (two) times daily. 60 tablet 11   cyclobenzaprine (FLEXERIL) 10 MG tablet Take 10 mg by mouth 3 (three) times daily as needed for muscle spasms.     digoxin (LANOXIN) 0.125 MG tablet Take 1 tablet (0.125 mg total) by mouth daily. 90 tablet 2   empagliflozin (JARDIANCE) 10 MG TABS tablet Take 1 tablet (10 mg total) by mouth daily. 90 tablet 2   enoxaparin (LOVENOX) 120 MG/0.8ML injection Inject 0.8 mLs (120 mg total) into the skin 2 (two) times daily. 45.6 mL 1   furosemide (LASIX) 40 MG tablet Take 1 tablet (40 mg total) by mouth daily. Needs follow up appointment for further refills 90 tablet 0   gabapentin (NEURONTIN) 300 MG capsule Take 300 mg by mouth 2 (two) times daily.  6   KLOR-CON M20 20 MEQ tablet TAKE 1 TABLET BY MOUTH EVERY DAY 90 tablet  0   metFORMIN (GLUCOPHAGE-XR) 500 MG 24 hr tablet Take 1,000 mg by mouth 2 (two) times daily.  1   oxyCODONE-acetaminophen (PERCOCET) 7.5-325 MG tablet Take 1 tablet by mouth every 6 (six) hours as needed for severe pain (back pain).     OZEMPIC, 1 MG/DOSE, 4 MG/3ML SOPN Inject 1 mg into the skin every Monday.     rosuvastatin (CRESTOR) 20 MG tablet Take 20 mg by mouth daily.     sacubitril-valsartan (ENTRESTO) 97-103 MG Take 1 tablet by mouth 2 (two) times daily. 180 tablet 2   sildenafil (REVATIO) 20 MG tablet Take 20 mg by mouth daily as needed (ED). (Patient not taking: Reported on 01/25/2022)     spironolactone (ALDACTONE) 25 MG tablet Take 1 tablet (25 mg total) by mouth daily. 90 tablet 2   TRESIBA FLEXTOUCH 200 UNIT/ML FlexTouch Pen Inject 30 Units into the skin every evening.     No current facility-administered medications for this visit.     PHYSICAL EXAMINATION: ECOG PERFORMANCE STATUS: 0 - Asymptomatic  There were no vitals filed for this visit.  There were no vitals filed for this visit.  PE not done, telephone visit   LABORATORY DATA:  I have reviewed the data as listed Lab Results  Component Value Date   WBC 8.0 12/31/2021   HGB 15.5 12/31/2021   HCT 48.7 12/31/2021   MCV 83.0 12/31/2021   PLT 234 12/31/2021     Chemistry      Component Value Date/Time   NA 140 12/30/2021 0315   NA 139 01/01/2013 1328   K 3.7 12/30/2021 0315   K 4.1 01/01/2013 1328   CL 104 12/30/2021 0315   CL 102 07/01/2012 1439   CO2 26 12/30/2021 0315   CO2 27 01/01/2013 1328   BUN 10 12/30/2021 0315   BUN 14.5 01/01/2013 1328   CREATININE 1.19 12/30/2021 0315   CREATININE 1.2 01/01/2013 1328      Component Value Date/Time   CALCIUM 9.2 12/30/2021 0315   CALCIUM 9.5 01/01/2013 1328   ALKPHOS 43 12/30/2021 0315   ALKPHOS 63 01/01/2013 1328  AST 11 (L) 12/30/2021 0315   AST 40 (H) 01/01/2013 1328   ALT 14 12/30/2021 0315   ALT 27 01/01/2013 1328   BILITOT 1.7 (H)  12/30/2021 0315   BILITOT 1.39 (H) 01/01/2013 1328       RADIOGRAPHIC STUDIES: I have personally reviewed the radiological images as listed and agreed with the findings in the report. CT Abdomen Pelvis W Contrast  Result Date: 02/03/2022 CLINICAL DATA:  Metastatic colon cancer for staging. Metastasis to liver. EXAM: CT ABDOMEN AND PELVIS WITH CONTRAST TECHNIQUE: Multidetector CT imaging of the abdomen and pelvis was performed using the standard protocol following bolus administration of intravenous contrast. RADIATION DOSE REDUCTION: This exam was performed according to the departmental dose-optimization program which includes automated exposure control, adjustment of the mA and/or kV according to patient size and/or use of iterative reconstruction technique. CONTRAST:  171m OMNIPAQUE IOHEXOL 300 MG/ML  SOLN COMPARISON:  CT abdomen and pelvis 12/17/2019.  CT chest 12/29/2021. FINDINGS: Lower chest: Lung bases are clear. Hepatobiliary: Postoperative changes consistent with left partial hepatectomy and cholecystectomy. No bile duct dilatation. No focal liver lesions are demonstrated. Pancreas: Unremarkable. No pancreatic ductal dilatation or surrounding inflammatory changes. Spleen: Cyst with calcification in the top of the spleen is unchanged since prior study, likely benign. Adrenals/Urinary Tract: No adrenal gland nodules. Two stones in the left kidney, largest measuring 5 mm diameter. No hydronephrosis or hydroureter. Low-attenuation lesion in the upper pole of the right kidney consistent with a cyst. No change since prior study. No imaging follow-up is indicated. Bladder is unremarkable. Stomach/Bowel: Stomach, small bowel, and colon are not abnormally distended. No wall thickening or inflammatory changes. Colon is diffusely stool-filled. Appendix is not identified. Vascular/Lymphatic: Aortic atherosclerosis. No enlarged abdominal or pelvic lymph nodes. Reproductive: Prostate is unremarkable. Other:  No free air or free fluid in the abdomen. Tiny gas foci and nodular foci demonstrated in the subcutaneous fat of the anterior abdominal wall. This is likely related to injection sites. Small right inguinal hernia containing fat. Musculoskeletal: Intramuscular lipoma in the right gluteus medius muscle. Degenerative changes in the spine and hips. No bone metastases are identified. IMPRESSION: 1. No evidence of metastatic disease in the abdomen or pelvis. 2. Nonobstructing stones in the left kidney. 3. Aortic atherosclerosis. 4. Gas and nodular foci demonstrated in the subcutaneous fat of the abdominal wall likely relate to injection sites. Electronically Signed   By: WLucienne CapersM.D.   On: 02/03/2022 20:41    All questions were answered. The patient knows to call the clinic with any problems, questions or concerns. I spent 12 minutes in the care of this patient including history, review of records, counseling and coordination of care.  I connected with  PWerner Leanon 02/10/22 by telephone application and verified that I am speaking with the correct person using two identifiers.    PBenay Pike MD 02/10/2022 12:33 AM

## 2022-02-10 ENCOUNTER — Telehealth: Payer: Self-pay | Admitting: *Deleted

## 2022-02-10 ENCOUNTER — Encounter: Payer: Self-pay | Admitting: Hematology and Oncology

## 2022-02-10 NOTE — Assessment & Plan Note (Signed)
This is a 61 year old male patient with previous history of metastatic colon cancer with no evidence of disease for the past several years, currently not on any kind of surveillance who was on Xarelto for blood clots about a year ago.  He most recently developed a saddle pulmonary embolism while on Xarelto hence was transitioned to Lovenox.  He is now referred to hematology for additional anticoagulation recommendations.   Given his past medical history of metastatic colorectal cancer, have discussed about repeating a CT abdomen pelvis to evaluate for any possible recurrence. This didn't show any evidence of recurrence. Despite the cause of PE, I have discussed about lifelong anticoagulation with this patient.  I discussed about warfarin but he was reluctant during his last visit.  He is here for telephone visit. We discussed that his hypercoagulable work up didn't show any clear evidence of clotting disorder. He has abnormal LA but this is not a reliable test while on lovenox. He then told me he would like to try a pill. I mentioned to him that we will contact his PCP's office to see if they can kindly manage his INR's since he may not have to follow up wth [place]. He will need life long anticoagulation with therapeutic INR of 2-3.  He was ok with this plan. RTC with [place] as needed.

## 2022-02-10 NOTE — Telephone Encounter (Addendum)
-----   Message from Benay Pike, MD sent at 02/10/2022 12:35 AM EDT ----- Val  This is the pt we discussed about transitioning to warfarin, if their PCP can kindly manage it.  Thanks,  This RN called pt's primary MD to discuss need for pt to be followed for life long anticoagulation therapy with pt wanting to do coumadin- and need for monitoring.  Consultation was provided by this office for definitive diagnosis but does not need to follow up in this office as well as this office does not have a coumadin clinic.  This RN left her direct phone number and name for return call per above request.

## 2022-02-13 ENCOUNTER — Ambulatory Visit: Payer: Medicare Other

## 2022-02-14 ENCOUNTER — Other Ambulatory Visit (HOSPITAL_COMMUNITY): Payer: Self-pay | Admitting: Internal Medicine

## 2022-02-14 ENCOUNTER — Telehealth: Payer: Self-pay | Admitting: *Deleted

## 2022-02-14 ENCOUNTER — Other Ambulatory Visit: Payer: Self-pay | Admitting: *Deleted

## 2022-02-14 NOTE — Telephone Encounter (Signed)
Per MD review patient will need to be on lifelong anticoagulation therapy with goal of therapeutic INR between 2-3 which will be managed by his primary MD  Recommendation per this office: Patient to continue on lovenox daily and start coumadin 10 mg daily. Check INR on day 3 and day 5 Patient may stop the lovenox once INR is greater then 2 post 2 consecutive lab draws. Dose coumadin appropriately to maintain INR between 2-3.  The above recommendation will be faxed to Julie Martinique NP at [phone]

## 2022-02-15 ENCOUNTER — Telehealth: Payer: Self-pay | Admitting: *Deleted

## 2022-02-15 NOTE — Telephone Encounter (Signed)
This RN called pt's number and spoke with his caregiver - informed her of plan for Julie Martinique NP with the patient's primary care to initiate oral anticoagulation with need for blood draws to monitor dosing.  Informed her above information has been faxed to her office as well as need for Julie Martinique to call in the coumadin due to need for coordination of lab appointments once started.  Lindora verbalized understanding of plan and to call primary MD office if they do not call her.  This RN 's name given for any further concerns regarding pt's visit and recommended care.

## 2022-02-16 ENCOUNTER — Ambulatory Visit: Payer: Medicare Other | Admitting: Physical Therapy

## 2022-02-21 ENCOUNTER — Encounter: Payer: Self-pay | Admitting: Physical Therapy

## 2022-02-21 ENCOUNTER — Ambulatory Visit: Payer: Medicare Other | Attending: Internal Medicine | Admitting: Physical Therapy

## 2022-02-21 DIAGNOSIS — R262 Difficulty in walking, not elsewhere classified: Secondary | ICD-10-CM | POA: Insufficient documentation

## 2022-02-21 DIAGNOSIS — M5459 Other low back pain: Secondary | ICD-10-CM | POA: Diagnosis not present

## 2022-02-21 DIAGNOSIS — R2689 Other abnormalities of gait and mobility: Secondary | ICD-10-CM | POA: Insufficient documentation

## 2022-02-21 DIAGNOSIS — M6281 Muscle weakness (generalized): Secondary | ICD-10-CM | POA: Insufficient documentation

## 2022-02-21 NOTE — Therapy (Signed)
OUTPATIENT PHYSICAL THERAPY TREATMENT NOTE   Patient Name: Raymond Allen MRN: [id number] DOB:1960/04/18, 61 y.o., male Today's Date: 02/21/2022  PCP: [place], Julie M, NP   REFERRING PROVIDER: Jolaine Artist, MD  END OF SESSION:   PT End of Session - 02/21/22 1325     Visit Number 2    Number of Visits 13    Date for PT Re-Evaluation 03/24/22    Authorization Type [employer] MEDICARE    Progress Note Due on Visit 10    PT Start Time 1320    PT Stop Time 6546    PT Time Calculation (min) 35 min             Past Medical History:  Diagnosis Date   Cancer ([place])    Colon cancer metastasized to liver ([place]) 01/05/2012   Colon carcinoma ([place])    colon ca dx 2001;   Diabetes mellitus without complication ([place])    Hypertension    Muscle weakness-general 01/05/2012   Past Surgical History:  Procedure Laterality Date   CHOLECYSTECTOMY     COLON SURGERY     sigmoid colectomy, appendectomy   INCISIONAL HERNIA REPAIR N/A 11/27/2017   Procedure: LAPAROSCOPIC ASSISTED INCISIONAL HERNIA;  Surgeon: Clovis Riley, MD;  Location: [facility];  Service: General;  Laterality: N/A;   INSERTION OF MESH N/A 11/27/2017   Procedure: INSERTION OF MESH;  Surgeon: Clovis Riley, MD;  Location: [facility];  Service: General;  Laterality: N/A;   LAPAROSCOPIC LYSIS OF ADHESIONS N/A 11/27/2017   Procedure: LAPAROSCOPIC LYSIS OF ADHESIONS;  Surgeon: Clovis Riley, MD;  Location: [facility];  Service: General;  Laterality: N/A;   LIVER SURGERY     partial removal duke in 2003   PORT-A-CATH REMOVAL Right 07/16/2012   Procedure: REMOVAL PORT-A-CATH;  Surgeon: Haywood Lasso, MD;  Location: [facility];  Service: General;  Laterality: Right;   RIGHT/LEFT HEART CATH AND CORONARY ANGIOGRAPHY N/A 04/01/2021   Procedure: RIGHT/LEFT HEART CATH AND CORONARY ANGIOGRAPHY;  Surgeon: Jolaine Artist, MD;  Location: [facility];  Service: Cardiovascular;  Laterality: N/A;   Patient Active  Problem List   Diagnosis Date Noted   Chronic combined systolic and diastolic CHF (congestive heart failure) ([place]) 12/30/2021   Atrial fibrillation, chronic ([place]) 12/30/2021   Mixed hyperlipidemia 12/30/2021   Obesity (BMI 30-39.9) 12/30/2021   Pulmonary embolism ([place]) 12/29/2021   Physical deconditioning 50/35/4656   Acute systolic heart failure ([place]) 04/05/2021   Pulmonary edema 81/27/5170   Acute metabolic encephalopathy 01/74/9449   CKD (chronic kidney disease) stage 3, GFR 30-59 ml/min (HCC) 04/05/2021   Atrial arrhythmia 04/05/2021   Respiratory distress 03/29/2021   Heart failure, type unknown ([place])    Type 2 diabetes mellitus without complication ([place])    Incisional hernia 11/27/2017   Colon cancer metastasized to liver ([place]) 01/05/2012   FLANK PAIN, RIGHT 01/21/2008   TOBACCO ABUSE 12/26/2007   HYPERGLYCEMIA 12/26/2007   PERS HX NONCOMPLIANCE W/MED TX PRS HAZARDS HLTH 12/26/2007   CARCINOMA, COLON 12/17/2007   Essential hypertension 12/17/2007   Respiratory failure, acute ([place]) 12/17/2007    REFERRING DIAG: R53.81 (ICD-10-CM) - Physical deconditioning  THERAPY DIAG:  Other low back pain  Muscle weakness (generalized)  Other abnormalities of gait and mobility  Difficulty in walking, not elsewhere classified  Rationale for Evaluation and Treatment rehabilitation  PERTINENT HISTORY: Saddle pulmonary embolii, Heart failure, atrial arrhythmia, CHF, Afib, Respiratory failure, CHOLECYSTECTOMY-2019, COLON SURGERY-Cancer 2013, [place], DM,  low back pain, R groin pain, R flank pain, obesity  PRECAUTIONS: Other: Cardiopulmonary  SUBJECTIVE:                                                                                                                                                                                      SUBJECTIVE STATEMENT:  It feels like fluid runs from my leg to my pelvis. The pain in my groin is almost unbearable but I am used to it.     PAIN:  Are you having pain? Yes: NPRS scale: 7-8/10 Pain location: R groin Pain description: grabbng Aggravating factors: walking Relieving factors: rest Low Back pain is 4/10 and better after injections   OBJECTIVE: (objective measures completed at initial evaluation unless otherwise dated)   DIAGNOSTIC FINDINGS:  12/29/21 CT angio chest PE IMPRESSION: Saddle embolus extends into pulmonary arteries in both upper and lower lobes bilaterally. No evidence of right heart strain.   PATIENT SURVEYS:  FOTO: Perceived function   34%, predicted   49%    COGNITION:           Overall cognitive status: Within functional limits for tasks assessed                          SENSATION: WFL   EDEMA:  None tested for the lower legs   POSTURE: flexed trunk    PALPATION: NT   LOWER EXTREMITY ROM:            Limited hip flexion due to pain Active ROM Right eval Left eval  Hip flexion      Hip extension      Hip abduction      Hip adduction      Hip internal rotation      Hip external rotation      Knee flexion      Knee extension      Ankle dorsiflexion      Ankle plantarflexion      Ankle inversion      Ankle eversion       (Blank rows = not tested)   LOWER EXTREMITY MMT:   MMT Right eval Left eval  Hip flexion 3 3  Hip extension 3 3  Hip abduction 3 3  Hip adduction 3+ 3+  Hip internal rotation      Hip external rotation 3 3  Knee flexion 4 4  Knee extension 4 4  Ankle dorsiflexion      Ankle plantarflexion      Ankle inversion      Ankle eversion       (Blank rows = not tested)  LOWER EXTREMITY SPECIAL TESTS:  NT   FUNCTIONAL TESTS:  5 times sit to stand: TBA 2 minute walk test: 172 feet 02/21/22 Sit to stand is labored   GAIT: Distance walked: 289f pt estimates his walking tolerance to 3039fAssistive device utilized: None Level of assistance: Complete Independence Comments: Antalgic gait pattern over the R LE   [facility]                                                 DATE: 02/21/22 Therapeutic Exercise: 2MWT Clam AROM/ butterfly stretch Clam with green band x 10 Bridge x 10 March 10 x 2  LTR x 10    TODAY'S TREATMENT:               Eval only   PATIENT EDUCATION:  Education details: Eval findings, POC, HEP Person educated: Patient and Caregiver girl friend Education method: Explanation Education comprehension: verbalized understanding and needs further education   HOME EXERCISE PROGRAM: NA on Eval Access Code: [email or web address]: [email or web address] Date: 02/21/2022 Prepared by: JeHessie DienerExercises - Supine Bridge  - 1 x daily - 7 x weekly - 1 sets - 10 reps - Supine Lower Trunk Rotation  - 1 x daily - 7 x weekly - 2 sets - 10 reps - Supine March  - 1 x daily - 7 x weekly - 2 sets - 10 reps   ASSESSMENT:   CLINICAL IMPRESSION: Patient is a [age].o. male who was seen today for physical therapy treatment for physical deconditioning. Time spent capturing 2 MWT at 172 feet without AD. Also, established initial HEP. He was able to complete all therex with reports of stable, but high level pain in right groin and reports a feeling of running fluid from anterior hip into lower abdomen while in hooklying position. He was given 3 HEP exercises to start with.   Pt will benefit from skilled PT to address impairments for improved functional mobility and safety.    OBJECTIVE IMPAIRMENTS Abnormal gait, cardiopulmonary status limiting activity, decreased activity tolerance, decreased balance, decreased knowledge of condition, difficulty walking, decreased ROM, decreased strength, postural dysfunction, obesity, and pain.    ACTIVITY LIMITATIONS carrying, lifting, bending, standing, squatting, stairs, and locomotion level   PARTICIPATION LIMITATIONS: meal prep, cleaning, laundry, shopping, and community activity   PERSONAL FACTORS Fitness, Past/current experiences, Time since onset of  injury/illness/exacerbation, and 3+ comorbidities:    Saddle pulmonary embolii, Heart failure, atrial arrhythmia, CHF, Afib, Respiratory failure, CHOLECYSTECTOMY-2019, COLON SURGERY-Cancer 2013, INCISIONAL HERNIA REPAIR, DM, low back pain, R groin pain, R flank pain, obesityare also affecting patient's functional outcome.    REHAB POTENTIAL: Fair chronicity   CLINICAL DECISION MAKING: Unstable/unpredictable   EVALUATION COMPLEXITY: High     GOALS:   SHORT TERM GOALS: Target date: 02/24/2022  Pt will be Ind in an initial HEP Baseline: Not initiated Goal status: INITIAL   LONG TERM GOALS: Target date: 03/24/22    Pt will be Ind in a final HEP to maintain achieved LOF Baseline: Not initiated Goal status: INITIAL   2.  Increase hip strength to 4/5 and knee strength to 5/5 for improved functional ability and safety Baseline: see flow sheets Goal status: INITIAL   3.  Improve 5xSTS by MCID of 5" and 2MWT by MCID of 5089fs indication of improved functional mobility Baseline:  2 MWT 172 feet 02/21/22, 5 x STS TBA Goal status: INITIAL   4.  Pt's FOTO score will improved to the predicted value of 49% as indication of improved function Baseline: 345 Goal status: INITIAL   PLAN: PT FREQUENCY: 2x/week   PT DURATION: 6 weeks   PLANNED INTERVENTIONS: Therapeutic exercises, Therapeutic activity, Balance training, Gait training, Patient/Family education, Self Care, Joint mobilization, Cryotherapy, Moist heat, Taping, Manual therapy, and Re-evaluation   PLAN FOR NEXT SESSION: Review FOTO; initiate HEP; use of modalities, manual therapy; assess 5xSTS? Hessie Diener, PTA 02/21/22 2:08 PM Phone: [phone] Fax: [phone]

## 2022-02-23 ENCOUNTER — Ambulatory Visit: Payer: Medicare Other

## 2022-02-23 DIAGNOSIS — M5459 Other low back pain: Secondary | ICD-10-CM

## 2022-02-23 DIAGNOSIS — M6281 Muscle weakness (generalized): Secondary | ICD-10-CM

## 2022-02-23 NOTE — Therapy (Signed)
OUTPATIENT PHYSICAL THERAPY TREATMENT NOTE   Patient Name: Raymond Allen MRN: [id number] DOB:January 15, 1961, 61 y.o., male Today's Date: 02/23/2022  PCP: [place], Julie M, NP   REFERRING PROVIDER: Jolaine Artist, MD  END OF SESSION:   PT End of Session - 02/23/22 1712     Visit Number 3    Number of Visits 13    Date for PT Re-Evaluation 03/24/22    Authorization Type [employer] MEDICARE    Progress Note Due on Visit 10    PT Start Time 1713    PT Stop Time 1740    PT Time Calculation (min) 27 min    Activity Tolerance Patient tolerated treatment well              Past Medical History:  Diagnosis Date   Cancer ([place])    Colon cancer metastasized to liver ([place]) 01/05/2012   Colon carcinoma ([place])    colon ca dx 2001;   Diabetes mellitus without complication ([place])    Hypertension    Muscle weakness-general 01/05/2012   Past Surgical History:  Procedure Laterality Date   CHOLECYSTECTOMY     COLON SURGERY     sigmoid colectomy, appendectomy   INCISIONAL HERNIA REPAIR N/A 11/27/2017   Procedure: LAPAROSCOPIC ASSISTED INCISIONAL HERNIA;  Surgeon: Clovis Riley, MD;  [facility];  Service: General;  Laterality: N/A;   INSERTION OF MESH N/A 11/27/2017   Procedure: INSERTION OF MESH;  Surgeon: Clovis Riley, MD;  [facility];  Service: General;  Laterality: N/A;   LAPAROSCOPIC LYSIS OF ADHESIONS N/A 11/27/2017   Procedure: LAPAROSCOPIC LYSIS OF ADHESIONS;  Surgeon: Clovis Riley, MD;  [facility];  Service: General;  Laterality: N/A;   LIVER SURGERY     partial removal duke in 2003   PORT-A-CATH REMOVAL Right 07/16/2012   Procedure: REMOVAL PORT-A-CATH;  Surgeon: Haywood Lasso, MD;  Location: [facility];  Service: General;  Laterality: Right;   RIGHT/LEFT HEART CATH AND CORONARY ANGIOGRAPHY N/A 04/01/2021   Procedure: RIGHT/LEFT HEART CATH AND CORONARY ANGIOGRAPHY;  Surgeon: Jolaine Artist, MD;  Location: [facility];   Service: Cardiovascular;  Laterality: N/A;   Patient Active Problem List   Diagnosis Date Noted   Chronic combined systolic and diastolic CHF (congestive heart failure) ([place]) 12/30/2021   Atrial fibrillation, chronic ([place]) 12/30/2021   Mixed hyperlipidemia 12/30/2021   Obesity (BMI 30-39.9) 12/30/2021   Pulmonary embolism ([place]) 12/29/2021   Physical deconditioning 65/78/4696   Acute systolic heart failure ([place]) 04/05/2021   Pulmonary edema 29/52/8413   Acute metabolic encephalopathy 24/40/1027   CKD (chronic kidney disease) stage 3, GFR 30-59 ml/min (HCC) 04/05/2021   Atrial arrhythmia 04/05/2021   Respiratory distress 03/29/2021   Heart failure, type unknown ([place])    Type 2 diabetes mellitus without complication ([place])    Incisional hernia 11/27/2017   Colon cancer metastasized to liver ([place]) 01/05/2012   FLANK PAIN, RIGHT 01/21/2008   TOBACCO ABUSE 12/26/2007   HYPERGLYCEMIA 12/26/2007   PERS HX NONCOMPLIANCE W/MED TX PRS HAZARDS HLTH 12/26/2007   CARCINOMA, COLON 12/17/2007   Essential hypertension 12/17/2007   Respiratory failure, acute ([place]) 12/17/2007    REFERRING DIAG: R53.81 (ICD-10-CM) - Physical deconditioning  THERAPY DIAG:  Other low back pain  Muscle weakness (generalized)  Rationale for Evaluation and Treatment rehabilitation  PERTINENT HISTORY: Saddle pulmonary embolii, Heart failure, atrial arrhythmia, CHF, Afib, Respiratory failure, CHOLECYSTECTOMY-2019, COLON SURGERY-Cancer 2013, INCISIONAL HERNIA REPAIR, DM, low back pain, R  groin pain, R flank pain, obesity  PRECAUTIONS: Other: Cardiopulmonary  SUBJECTIVE:                                                                                                                                                                                      SUBJECTIVE STATEMENT:  Pt arrives late to session, presents to PT with continued reports of severe R groin pain, still has sensation of fluid running in R LE. Pt has been  fairly compliant with HEP with no adverse effect noted to this point. He is ready to begin PT at this time.    PAIN:  Are you having pain? Yes: NPRS scale: 7/10 Pain location: R groin Pain description: grabbng Aggravating factors: walking Relieving factors: rest Low Back pain is 4/10 and better after injections   OBJECTIVE: (objective measures completed at initial evaluation unless otherwise dated)  PATIENT SURVEYS:  FOTO: Perceived function   34%, predicted   49%    COGNITION:           Overall cognitive status: Within functional limits for tasks assessed                          SENSATION: WFL   EDEMA:  None tested for the lower legs   POSTURE: flexed trunk    PALPATION: NT   LOWER EXTREMITY ROM:            Limited hip flexion due to pain Active ROM Right eval Left eval  Hip flexion      Hip extension      Hip abduction      Hip adduction      Hip internal rotation      Hip external rotation      Knee flexion      Knee extension      Ankle dorsiflexion      Ankle plantarflexion      Ankle inversion      Ankle eversion       (Blank rows = not tested)   LOWER EXTREMITY MMT:   MMT Right eval Left eval  Hip flexion 3 3  Hip extension 3 3  Hip abduction 3 3  Hip adduction 3+ 3+  Hip internal rotation      Hip external rotation 3 3  Knee flexion 4 4  Knee extension 4 4  Ankle dorsiflexion      Ankle plantarflexion      Ankle inversion      Ankle eversion       (Blank rows = not tested)   LOWER EXTREMITY SPECIAL TESTS:  NT   FUNCTIONAL  TESTS:  5 times sit to stand: TBA 2 minute walk test: 172 feet 02/21/22 Sit to stand is labored   GAIT: Distance walked: 218f pt estimates his walking tolerance to 3064fAssistive device utilized: None Level of assistance: Complete Independence Comments: Antalgic gait pattern over the R LE    [facility]                                                DATE:  02/23/2022 Therapeutic Exercise: NuStep lvl 5 UE/LE x 5 min while taking subjective Supine clamshell 2x10 GTB Bridge x 10 Supine SLR 2x10 each Seated ball squeeze 2x10 - 5" hold  LTR x 10  [facility] Adult PT Treatment:                                                DATE: 02/21/2022 Therapeutic Exercise: 2MWT Clam AROM/ butterfly stretch Clam with green band x 10 Bridge x 10 March 10 x 2  LTR x 10   PATIENT EDUCATION:  Education details: HEP Person educated: Patient and Caregiver girl friend Education method: Explanation Education comprehension: verbalized understanding and needs further education   HOME EXERCISE PROGRAM: NA on Eval Access Code: [email or web address]: [email or web address] Date: 02/21/2022 Prepared by: JeHessie DienerExercises - Supine Bridge  - 1 x daily - 7 x weekly - 1 sets - 10 reps - Supine Lower Trunk Rotation  - 1 x daily - 7 x weekly - 2 sets - 10 reps - Supine March  - 1 x daily - 7 x weekly - 2 sets - 10 reps   ASSESSMENT:   CLINICAL IMPRESSION: Pt was able to complete prescribed exercises with improved tolerance to treatment today. Therapy focused on improving LE strength and activity tolerance. He continues to benefit from skilled PT services, will continue per POC as prescribed.    OBJECTIVE IMPAIRMENTS Abnormal gait, cardiopulmonary status limiting activity, decreased activity tolerance, decreased balance, decreased knowledge of condition, difficulty walking, decreased ROM, decreased strength, postural dysfunction, obesity, and pain.    ACTIVITY LIMITATIONS carrying, lifting, bending, standing, squatting, stairs, and locomotion level   PARTICIPATION LIMITATIONS: meal prep, cleaning, laundry, shopping, and community activity   PERSONAL FACTORS Fitness, Past/current experiences, Time since onset of injury/illness/exacerbation, and 3+ comorbidities:    Saddle pulmonary embolii, Heart failure, atrial arrhythmia, CHF, Afib, Respiratory failure,  CHOLECYSTECTOMY-2019, COLON SURGERY-Cancer 2013, INCISIONAL HERNIA REPAIR, DM, low back pain, R groin pain, R flank pain, obesityare also affecting patient's functional outcome.      GOALS:   SHORT TERM GOALS: Target date: 02/24/2022  Pt will be Ind in an initial HEP Baseline: Not initiated Goal status: INITIAL   LONG TERM GOALS: Target date: 03/24/22    Pt will be Ind in a final HEP to maintain achieved LOF Baseline: Not initiated Goal status: INITIAL   2.  Increase hip strength to 4/5 and knee strength to 5/5 for improved functional ability and safety Baseline: see flow sheets Goal status: INITIAL   3.  Improve 5xSTS by MCID of 5" and 2MWT by MCID of 5037fs indication of improved functional mobility Baseline: 2 MWT 172 feet 02/21/22, 5 x STS TBA Goal status: INITIAL   4.  Pt's FOTO score will improved to the predicted value of 49% as indication of improved function Baseline: 345 Goal status: INITIAL   PLAN: PT FREQUENCY: 2x/week   PT DURATION: 6 weeks   PLANNED INTERVENTIONS: Therapeutic exercises, Therapeutic activity, Balance training, Gait training, Patient/Family education, Self Care, Joint mobilization, Cryotherapy, Moist heat, Taping, Manual therapy, and Re-evaluation   PLAN FOR NEXT SESSION: Review FOTO; initiate HEP; use of modalities, manual therapy; assess 5xSTS?  Ward Chatters PT  02/23/22 5:34 PM

## 2022-02-27 ENCOUNTER — Ambulatory Visit: Payer: Medicare Other

## 2022-02-27 DIAGNOSIS — M6281 Muscle weakness (generalized): Secondary | ICD-10-CM

## 2022-02-27 DIAGNOSIS — M5459 Other low back pain: Secondary | ICD-10-CM | POA: Diagnosis not present

## 2022-02-27 DIAGNOSIS — R2689 Other abnormalities of gait and mobility: Secondary | ICD-10-CM

## 2022-02-27 NOTE — Therapy (Signed)
OUTPATIENT PHYSICAL THERAPY TREATMENT NOTE   Patient Name: Raymond Allen MRN: [id number] DOB:[birth year], 61 y.o., male Today's Date: 02/27/2022  PCP: [place], Julie M, NP   REFERRING PROVIDER: Jolaine Artist, MD  END OF SESSION:   PT End of Session - 02/27/22 1623     Visit Number 4    Number of Visits 13    Date for PT Re-Evaluation 03/24/22    Authorization Type [employer] MEDICARE    Progress Note Due on Visit 10    PT Start Time 0175    PT Stop Time 1702    PT Time Calculation (min) 38 min    Activity Tolerance Patient tolerated treatment well               Past Medical History:  Diagnosis Date   Cancer ([place])    Colon cancer metastasized to liver ([place]) 01/05/2012   Colon carcinoma ([place])    colon ca dx 2001;   Diabetes mellitus without complication ([place])    Hypertension    Muscle weakness-general 01/05/2012   Past Surgical History:  Procedure Laterality Date   CHOLECYSTECTOMY     COLON SURGERY     sigmoid colectomy, appendectomy   INCISIONAL HERNIA REPAIR N/A 11/27/2017   Procedure: LAPAROSCOPIC ASSISTED INCISIONAL HERNIA;  Surgeon: Clovis Riley, MD;  Location: [facility];  Service: General;  Laterality: N/A;   INSERTION OF MESH N/A 11/27/2017   Procedure: INSERTION OF MESH;  Surgeon: Clovis Riley, MD;  Location: [facility];  Service: General;  Laterality: N/A;   LAPAROSCOPIC LYSIS OF ADHESIONS N/A 11/27/2017   Procedure: LAPAROSCOPIC LYSIS OF ADHESIONS;  Surgeon: Clovis Riley, MD;  Location: [facility];  Service: General;  Laterality: N/A;   LIVER SURGERY     partial removal duke in 2003   PORT-A-CATH REMOVAL Right 07/16/2012   Procedure: REMOVAL PORT-A-CATH;  Surgeon: Haywood Lasso, MD;  Location: [facility] [facility];  Service: General;  Laterality: Right;   RIGHT/LEFT HEART CATH AND CORONARY ANGIOGRAPHY N/A 04/01/2021   Procedure: RIGHT/LEFT HEART CATH AND CORONARY ANGIOGRAPHY;  Surgeon: Jolaine Artist, MD;  Location: [facility];   Service: Cardiovascular;  Laterality: N/A;   Patient Active Problem List   Diagnosis Date Noted   Chronic combined systolic and diastolic CHF (congestive heart failure) ([place]) 12/30/2021   Atrial fibrillation, chronic ([place]) 12/30/2021   Mixed hyperlipidemia 12/30/2021   Obesity (BMI 30-39.9) 12/30/2021   Pulmonary embolism ([place]) 12/29/2021   Physical deconditioning 02/08/8526   Acute systolic heart failure ([place]) 04/05/2021   Pulmonary edema 78/24/2353   Acute metabolic encephalopathy 61/44/3154   CKD (chronic kidney disease) stage 3, GFR 30-59 ml/min (H[facility]) 04/05/2021   Atrial arrhythmia 04/05/2021   Respiratory distress 03/29/2021   Heart failure, type unknown ([place])    Type 2 diabetes mellitus without complication ([place])    Incisional hernia 11/27/2017   Colon cancer metastasized to liver ([place]) 01/05/2012   FLANK PAIN, RIGHT 01/21/2008   TOBACCO ABUSE 12/26/2007   HYPERGLYCEMIA 12/26/2007   PERS HX NONCOMPLIANCE W/MED TX PRS HAZARDS HLTH 12/26/2007   CARCINOMA, COLON 12/17/2007   Essential hypertension 12/17/2007   Respiratory failure, acute ([place]) 12/17/2007    REFERRING DIAG: R53.81 (ICD-10-CM) - Physical deconditioning  THERAPY DIAG:  Other low back pain  Muscle weakness (generalized)  Other abnormalities of gait and mobility  Rationale for Evaluation and Treatment rehabilitation  PERTINENT HISTORY: Saddle pulmonary embolii, Heart failure, atrial arrhythmia, CHF, Afib, Respiratory failure, CHOLECYSTECTOMY-2019, COLON SURGERY-Cancer 2013,  INCISIONAL HERNIA REPAIR, DM, low back pain, R groin pain, R flank pain, obesity  PRECAUTIONS: Other: Cardiopulmonary  SUBJECTIVE:                                                                                                                                                                                      SUBJECTIVE STATEMENT:  Pt presents to PT with reports of severe R groin pain. Notes that his lower back has continued to feel  better after injections. Is ready to begin PT at this time.    PAIN:  Are you having pain?  Yes: NPRS scale: 7/10 Pain location: R groin Pain description: grabbng Aggravating factors: walking Relieving factors: rest Low Back pain is 4/10 and better after injections   OBJECTIVE: (objective measures completed at initial evaluation unless otherwise dated)  PATIENT SURVEYS:  FOTO: Perceived function   34%, predicted   49%    COGNITION:           Overall cognitive status: Within functional limits for tasks assessed                          SENSATION: WFL   EDEMA:  None tested for the lower legs   POSTURE: flexed trunk    PALPATION: NT   LOWER EXTREMITY ROM:            Limited hip flexion due to pain Active ROM Right eval Left eval  Hip flexion      Hip extension      Hip abduction      Hip adduction      Hip internal rotation      Hip external rotation      Knee flexion      Knee extension      Ankle dorsiflexion      Ankle plantarflexion      Ankle inversion      Ankle eversion       (Blank rows = not tested)   LOWER EXTREMITY MMT:   MMT Right eval Left eval  Hip flexion 3 3  Hip extension 3 3  Hip abduction 3 3  Hip adduction 3+ 3+  Hip internal rotation      Hip external rotation 3 3  Knee flexion 4 4  Knee extension 4 4  Ankle dorsiflexion      Ankle plantarflexion      Ankle inversion      Ankle eversion       (Blank rows = not tested)   LOWER EXTREMITY SPECIAL TESTS:  NT   FUNCTIONAL TESTS:  5 times sit to stand: TBA 2  minute walk test: 172 feet 02/21/22 Sit to stand is labored   GAIT: Distance walked: 287f pt estimates his walking tolerance to 3023fAssistive device utilized: None Level of assistance: Complete Independence Comments: Antalgic gait pattern over the R LE    [facility]                                                DATE: 02/27/2022 Therapeutic Exercise: NuStep lvl 5 UE/LE x 5 min  while taking subjective Supine ball squeeze 2x10 - 3" hold Supine clamshell 2x15 GTB Supine march 2x10 GTB Bridge 2x10 Supine SLR 2x10 each LAQ 2.5# 3x10  [facility] Adult PT Treatment:                                                DATE: 02/23/2022 Therapeutic Exercise: NuStep lvl 5 UE/LE x 5 min while taking subjective Supine clamshell 2x10 GTB Bridge x 10 Supine SLR 2x10 each Seated ball squeeze 2x10 - 5" hold  LTR x 10  [facility] Adult PT Treatment:                                                DATE: 02/21/2022 Therapeutic Exercise: 2MWT Clam AROM/ butterfly stretch Clam with green band x 10 Bridge x 10 March 10 x 2  LTR x 10   PATIENT EDUCATION:  Education details: HEP Person educated: Patient and Caregiver girl friend Education method: Explanation Education comprehension: verbalized understanding and needs further education   HOME EXERCISE PROGRAM: NA on Eval Access Code: [email or web address]: [email or web address] Date: 02/21/2022 Prepared by: JeHessie DienerExercises - Supine Bridge  - 1 x daily - 7 x weekly - 1 sets - 10 reps - Supine Lower Trunk Rotation  - 1 x daily - 7 x weekly - 2 sets - 10 reps - Supine March  - 1 x daily - 7 x weekly - 2 sets - 10 reps   ASSESSMENT:   CLINICAL IMPRESSION: Pt was again able to complete all prescribed exercises with no adverse effect or increase in pain. Once again tolerated treatment better today, showing improving activity tolerance and decreased pain during therapeutic exercise. Pt continues to benefit from skilled PT services, will continue to be seen and progressed as able per POC.    OBJECTIVE IMPAIRMENTS Abnormal gait, cardiopulmonary status limiting activity, decreased activity tolerance, decreased balance, decreased knowledge of condition, difficulty walking, decreased ROM, decreased strength, postural dysfunction, obesity, and pain.    ACTIVITY LIMITATIONS carrying, lifting, bending, standing, squatting, stairs,  and locomotion level   PARTICIPATION LIMITATIONS: meal prep, cleaning, laundry, shopping, and community activity   PERSONAL FACTORS Fitness, Past/current experiences, Time since onset of injury/illness/exacerbation, and 3+ comorbidities:    Saddle pulmonary embolii, Heart failure, atrial arrhythmia, CHF, Afib, Respiratory failure, CHOLECYSTECTOMY-2019, COLON SURGERY-Cancer 2013, INCISIONAL HERNIA REPAIR, DM, low back pain, R groin pain, R flank pain, obesityare also affecting patient's functional outcome.      GOALS:   SHORT TERM GOALS: Target date: 02/24/2022  Pt will be Ind in an initial HEP Baseline: Not initiated  Goal status: INITIAL   LONG TERM GOALS: Target date: 03/24/22    Pt will be Ind in a final HEP to maintain achieved LOF Baseline: Not initiated Goal status: INITIAL   2.  Increase hip strength to 4/5 and knee strength to 5/5 for improved functional ability and safety Baseline: see flow sheets Goal status: INITIAL   3.  Improve 5xSTS by MCID of 5" and 2MWT by MCID of 74f as indication of improved functional mobility Baseline: 2 MWT 172 feet 02/21/22, 5 x STS TBA Goal status: INITIAL   4.  Pt's FOTO score will improved to the predicted value of 49% as indication of improved function Baseline: 345 Goal status: INITIAL   PLAN: PT FREQUENCY: 2x/week   PT DURATION: 6 weeks   PLANNED INTERVENTIONS: Therapeutic exercises, Therapeutic activity, Balance training, Gait training, Patient/Family education, Self Care, Joint mobilization, Cryotherapy, Moist heat, Taping, Manual therapy, and Re-evaluation   PLAN FOR NEXT SESSION: Review FOTO; initiate HEP; use of modalities, manual therapy; assess 5xSTS?  DWard ChattersPT  02/27/22 5:04 PM

## 2022-03-02 ENCOUNTER — Ambulatory Visit: Payer: Medicare Other

## 2022-03-06 ENCOUNTER — Ambulatory Visit: Payer: Medicare Other

## 2022-03-06 DIAGNOSIS — M5459 Other low back pain: Secondary | ICD-10-CM

## 2022-03-06 DIAGNOSIS — R2689 Other abnormalities of gait and mobility: Secondary | ICD-10-CM

## 2022-03-06 DIAGNOSIS — M6281 Muscle weakness (generalized): Secondary | ICD-10-CM

## 2022-03-06 NOTE — Therapy (Signed)
OUTPATIENT PHYSICAL THERAPY TREATMENT NOTE   Patient Name: Raymond Allen MRN: [id number] DOB:1960/11/18, 61 y.o., male Today's Date: 03/06/2022  PCP: [place], Julie M, NP   REFERRING PROVIDER: Jolaine Artist, MD  END OF SESSION:   PT End of Session - 03/06/22 1537     Visit Number 5    Number of Visits 13    Date for PT Re-Evaluation 03/24/22    Authorization Type [employer] MEDICARE    Progress Note Due on Visit 10    PT Start Time 7062    PT Stop Time 3762    PT Time Calculation (min) 38 min    Activity Tolerance Patient tolerated treatment well                Past Medical History:  Diagnosis Date   Cancer ([place])    Colon cancer metastasized to liver ([place]) 01/05/2012   Colon carcinoma ([place])    colon ca dx 2001;   Diabetes mellitus without complication ([place])    Hypertension    Muscle weakness-general 01/05/2012   Past Surgical History:  Procedure Laterality Date   CHOLECYSTECTOMY     COLON SURGERY     sigmoid colectomy, appendectomy   INCISIONAL HERNIA REPAIR N/A 11/27/2017   Procedure: LAPAROSCOPIC ASSISTED INCISIONAL HERNIA;  Surgeon: Clovis Riley, MD;  [facility];  Service: General;  Laterality: N/A;   INSERTION OF MESH N/A 11/27/2017   Procedure: INSERTION OF MESH;  Surgeon: Clovis Riley, MD;  [facility];  Service: General;  Laterality: N/A;   LAPAROSCOPIC LYSIS OF ADHESIONS N/A 11/27/2017   Procedure: LAPAROSCOPIC LYSIS OF ADHESIONS;  Surgeon: Clovis Riley, MD;  [facility];  Service: General;  Laterality: N/A;   LIVER SURGERY     partial removal duke in 2003   PORT-A-CATH REMOVAL Right 07/16/2012   Procedure: REMOVAL PORT-A-CATH;  Surgeon: Haywood Lasso, MD;  Location: [facility];  Service: General;  Laterality: Right;   RIGHT/LEFT HEART CATH AND CORONARY ANGIOGRAPHY N/A 04/01/2021   Procedure: RIGHT/LEFT HEART CATH AND CORONARY ANGIOGRAPHY;  Surgeon: Jolaine Artist, MD;  Location: [facility];   Service: Cardiovascular;  Laterality: N/A;   Patient Active Problem List   Diagnosis Date Noted   Chronic combined systolic and diastolic CHF (congestive heart failure) ([place]) 12/30/2021   Atrial fibrillation, chronic ([place]) 12/30/2021   Mixed hyperlipidemia 12/30/2021   Obesity (BMI 30-39.9) 12/30/2021   Pulmonary embolism ([place]) 12/29/2021   Physical deconditioning 83/15/1761   Acute systolic heart failure ([place]) 04/05/2021   Pulmonary edema 60/73/7106   Acute metabolic encephalopathy 26/94/8546   CKD (chronic kidney disease) stage 3, GFR 30-59 ml/min ([facility]) 04/05/2021   Atrial arrhythmia 04/05/2021   Respiratory distress 03/29/2021   Heart failure, type unknown ([place])    Type 2 diabetes mellitus without complication ([place])    Incisional hernia 11/27/2017   Colon cancer metastasized to liver ([place]) 01/05/2012   FLANK PAIN, RIGHT 01/21/2008   TOBACCO ABUSE 12/26/2007   HYPERGLYCEMIA 12/26/2007   PERS HX NONCOMPLIANCE W/MED TX PRS HAZARDS HLTH 12/26/2007   CARCINOMA, COLON 12/17/2007   Essential hypertension 12/17/2007   Respiratory failure, acute ([place]) 12/17/2007    REFERRING DIAG: R53.81 (ICD-10-CM) - Physical deconditioning  THERAPY DIAG:  Other low back pain  Muscle weakness (generalized)  Other abnormalities of gait and mobility  Rationale for Evaluation and Treatment rehabilitation  PERTINENT HISTORY: Saddle pulmonary embolii, Heart failure, atrial arrhythmia, CHF, Afib, Respiratory failure, CHOLECYSTECTOMY-2019, COLON SURGERY-Cancer  2013, INCISIONAL HERNIA REPAIR, DM, low back pain, R groin pain, R flank pain, obesity  PRECAUTIONS: Other: Cardiopulmonary  SUBJECTIVE:                                                                                                                                                                                      SUBJECTIVE STATEMENT:  Pt presents to PT with reports of continued pain in R groin. Has been compliant with HEP with no adverse  effect. Pt is ready to begin PT at this time.    PAIN:  Are you having pain?  Yes: NPRS scale: 7/10 Pain location: R groin Pain description: grabbng Aggravating factors: walking Relieving factors: rest Low Back pain is 4/10 and better after injections   OBJECTIVE: (objective measures completed at initial evaluation unless otherwise dated)  PATIENT SURVEYS:  FOTO: Perceived function   34%, predicted   49%    COGNITION:           Overall cognitive status: Within functional limits for tasks assessed                          SENSATION: WFL   EDEMA:  None tested for the lower legs   POSTURE: flexed trunk    PALPATION: NT   LOWER EXTREMITY ROM:            Limited hip flexion due to pain Active ROM Right eval Left eval  Hip flexion      Hip extension      Hip abduction      Hip adduction      Hip internal rotation      Hip external rotation      Knee flexion      Knee extension      Ankle dorsiflexion      Ankle plantarflexion      Ankle inversion      Ankle eversion       (Blank rows = not tested)   LOWER EXTREMITY MMT:   MMT Right eval Left eval  Hip flexion 3 3  Hip extension 3 3  Hip abduction 3 3  Hip adduction 3+ 3+  Hip internal rotation      Hip external rotation 3 3  Knee flexion 4 4  Knee extension 4 4  Ankle dorsiflexion      Ankle plantarflexion      Ankle inversion      Ankle eversion       (Blank rows = not tested)   LOWER EXTREMITY SPECIAL TESTS:  NT   FUNCTIONAL TESTS:  5 times sit to stand: TBA 2  minute walk test: 172 feet 02/21/22 Sit to stand is labored   GAIT: Distance walked: 268f pt estimates his walking tolerance to 3044fAssistive device utilized: None Level of assistance: Complete Independence Comments: Antalgic gait pattern over the R LE    TOD[facility]'S TREATMENT: [facility]                                                DATE: 03/06/2022 Therapeutic Exercise: NuStep lvl 5 UE/LE x 5 min while taking  subjective STS 3x5  LAQ 2x10 3# Supine SLR 2x10 each Supine ball squeeze 2x10 - 3" hold Supine clamshell 2x15 BTB Supine march 2x10 GTB Bridge 2x10 Standing hip abd/ext 2x10 each  [facility]                                                DATE: 02/27/2022 Therapeutic Exercise: NuStep lvl 5 UE/LE x 5 min while taking subjective Supine ball squeeze 2x10 - 3" hold Supine clamshell 2x15 GTB Supine march 2x10 GTB Bridge 2x10 Supine SLR 2x10 each LAQ 2.5# 3x10  [facility] Adult PT Treatment:                                                DATE: 02/23/2022 Therapeutic Exercise: NuStep lvl 5 UE/LE x 5 min while taking subjective Supine clamshell 2x10 GTB Bridge x 10 Supine SLR 2x10 each Seated ball squeeze 2x10 - 5" hold  LTR x 10  [facility] Adult PT Treatment:                                                DATE: 02/21/2022 Therapeutic Exercise: 2MWT Clam AROM/ butterfly stretch Clam with green band x 10 Bridge x 10 March 10 x 2  LTR x 10   PATIENT EDUCATION:  Education details: HEP Person educated: Patient and Caregiver girl friend Education method: Explanation Education comprehension: verbalized understanding and needs further education   HOME EXERCISE PROGRAM: Access Code: [email or web address]: [email or web address] Date: 03/06/2022 Prepared by: DaOctavio MannsExercises - Supine Lower Trunk Rotation  - 1 x daily - 7 x weekly - 2 sets - 10 reps - Supine Bridge  - 1 x daily - 7 x weekly - 2 sets - 10 reps - Active Straight Leg Raise with Quad Set  - 1 x daily - 7 x weekly - 2 sets - 10 reps - Hooklying Clamshell with Resistance  - 1 x daily - 7 x weekly - 2 sets - 15 reps - blue theraband hold - Standing Hip Abduction with Counter Support  - 1 x daily - 7 x weekly - 2 sets - 10 reps - Standing Hip Extension with Counter Support  - 1 x daily - 7 x weekly - 2 sets - 10 reps   ASSESSMENT:   CLINICAL IMPRESSION: Pt was able to complete all prescribed exercises  with no adverse effect and improved activity tolerance. Therapy today continue  to progress LE strength and endurance. He is progressing well with therapy, HEP updated for continued proximal strengthening. Will continue to progress as able per POC.    OBJECTIVE IMPAIRMENTS Abnormal gait, cardiopulmonary status limiting activity, decreased activity tolerance, decreased balance, decreased knowledge of condition, difficulty walking, decreased ROM, decreased strength, postural dysfunction, obesity, and pain.    ACTIVITY LIMITATIONS carrying, lifting, bending, standing, squatting, stairs, and locomotion level   PARTICIPATION LIMITATIONS: meal prep, cleaning, laundry, shopping, and community activity   PERSONAL FACTORS Fitness, Past/current experiences, Time since onset of injury/illness/exacerbation, and 3+ comorbidities:    Saddle pulmonary embolii, Heart failure, atrial arrhythmia, CHF, Afib, Respiratory failure, CHOLECYSTECTOMY-2019, COLON SURGERY-Cancer 2013, INCISIONAL HERNIA REPAIR, DM, low back pain, R groin pain, R flank pain, obesityare also affecting patient's functional outcome.      GOALS:   SHORT TERM GOALS: Target date: 02/24/2022  Pt will be Ind in an initial HEP Baseline: Not initiated Goal status: INITIAL   LONG TERM GOALS: Target date: 03/24/22    Pt will be Ind in a final HEP to maintain achieved LOF Baseline: Not initiated Goal status: INITIAL   2.  Increase hip strength to 4/5 and knee strength to 5/5 for improved functional ability and safety Baseline: see flow sheets Goal status: INITIAL   3.  Improve 5xSTS by MCID of 5" and 2MWT by MCID of 61f as indication of improved functional mobility Baseline: 2 MWT 172 feet 02/21/22, 5 x STS TBA Goal status: INITIAL   4.  Pt's FOTO score will improved to the predicted value of 49% as indication of improved function Baseline: 345 Goal status: INITIAL   PLAN: PT FREQUENCY: 2x/week   PT DURATION: 6 weeks   PLANNED  INTERVENTIONS: Therapeutic exercises, Therapeutic activity, Balance training, Gait training, Patient/Family education, Self Care, Joint mobilization, Cryotherapy, Moist heat, Taping, Manual therapy, and Re-evaluation   PLAN FOR NEXT SESSION: Review FOTO; initiate HEP; use of modalities, manual therapy; assess 5xSTS?  DWard ChattersPT  03/06/22 4:34 PM

## 2022-03-13 ENCOUNTER — Ambulatory Visit: Payer: Medicare Other

## 2022-03-13 DIAGNOSIS — M5459 Other low back pain: Secondary | ICD-10-CM | POA: Diagnosis not present

## 2022-03-13 DIAGNOSIS — R2689 Other abnormalities of gait and mobility: Secondary | ICD-10-CM

## 2022-03-13 DIAGNOSIS — R262 Difficulty in walking, not elsewhere classified: Secondary | ICD-10-CM

## 2022-03-13 DIAGNOSIS — M6281 Muscle weakness (generalized): Secondary | ICD-10-CM

## 2022-03-13 NOTE — Therapy (Signed)
OUTPATIENT PHYSICAL THERAPY TREATMENT NOTE   Patient Name: JAION LAGRANGE MRN: [id number] DOB:04/28/1960, 61 y.o., male Today's Date: 03/13/2022  PCP: [place], Julie M, NP   REFERRING PROVIDER: Jolaine Artist, MD  END OF SESSION:   PT End of Session - 03/13/22 1538     Visit Number 6    Number of Visits 13    Date for PT Re-Evaluation 03/24/22    Authorization Type [employer] MEDICARE    Progress Note Due on Visit 10    PT Start Time 1540    PT Stop Time 1619    PT Time Calculation (min) 39 min    Activity Tolerance Patient tolerated treatment well                 Past Medical History:  Diagnosis Date   Cancer ([place])    Colon cancer metastasized to liver ([place]) 01/05/2012   Colon carcinoma ([place])    colon ca dx 2001;   Diabetes mellitus without complication ([place])    Hypertension    Muscle weakness-general 01/05/2012   Past Surgical History:  Procedure Laterality Date   CHOLECYSTECTOMY     COLON SURGERY     sigmoid colectomy, appendectomy   INCISIONAL HERNIA REPAIR N/A 11/27/2017   Procedure: LAPAROSCOPIC ASSISTED INCISIONAL HERNIA;  Surgeon: Clovis Riley, MD;  Location: [facility];  Service: General;  Laterality: N/A;   INSERTION OF MESH N/A 11/27/2017   Procedure: INSERTION OF MESH;  Surgeon: Clovis Riley, MD;  [facility]: [facility];  Service: General;  Laterality: N/A;   LAPAROSCOPIC LYSIS OF ADHESIONS N/A 11/27/2017   Procedure: LAPAROSCOPIC LYSIS OF ADHESIONS;  Surgeon: Clovis Riley, MD;  [facility];  Service: General;  Laterality: N/A;   LIVER SURGERY     partial removal duke in 2003   PORT-A-CATH REMOVAL Right 07/16/2012   Procedure: REMOVAL PORT-A-CATH;  Surgeon: Haywood Lasso, MD;  Location: [facility];  Service: General;  Laterality: Right;   RIGHT/LEFT HEART CATH AND CORONARY ANGIOGRAPHY N/A 04/01/2021   Procedure: RIGHT/LEFT HEART CATH AND CORONARY ANGIOGRAPHY;  Surgeon: Jolaine Artist, MD;  Location: [facility];  Service: Cardiovascular;  Laterality: N/A;   Patient Active Problem List   Diagnosis Date Noted   Chronic combined systolic and diastolic CHF (congestive heart failure) ([place]) 12/30/2021   Atrial fibrillation, chronic ([place]) 12/30/2021   Mixed hyperlipidemia 12/30/2021   Obesity (BMI 30-39.9) 12/30/2021   Pulmonary embolism ([place]) 12/29/2021   Physical deconditioning 40/98/1191   Acute systolic heart failure ([place]) 04/05/2021   Pulmonary edema 47/82/9562   Acute metabolic encephalopathy 13/11/6576   CKD (chronic kidney disease) stage 3, GFR 30-59 ml/min (HCC) 04/05/2021   Atrial arrhythmia 04/05/2021   Respiratory distress 03/29/2021   Heart failure, type unknown ([place])    Type 2 diabetes mellitus without complication ([place])    Incisional hernia 11/27/2017   Colon cancer metastasized to liver ([place]) 01/05/2012   FLANK PAIN, RIGHT 01/21/2008   TOBACCO ABUSE 12/26/2007   HYPERGLYCEMIA 12/26/2007   PERS HX NONCOMPLIANCE W/MED TX PRS HAZARDS HLTH 12/26/2007   CARCINOMA, COLON 12/17/2007   Essential hypertension 12/17/2007   Respiratory failure, acute ([place]) 12/17/2007    REFERRING DIAG: R53.81 (ICD-10-CM) - Physical deconditioning  THERAPY DIAG:  Other low back pain  Muscle weakness (generalized)  Other abnormalities of gait and mobility  Difficulty in walking, not elsewhere classified  Rationale for Evaluation and Treatment rehabilitation  PERTINENT HISTORY: Saddle pulmonary embolii, Heart failure, atrial  arrhythmia, CHF, Afib, Respiratory failure, CHOLECYSTECTOMY-2019, COLON SURGERY-Cancer 2013, INCISIONAL HERNIA REPAIR, DM, low back pain, R groin pain, R flank pain, obesity  PRECAUTIONS: Other: Cardiopulmonary  SUBJECTIVE:                                                                                                                                                                                      SUBJECTIVE STATEMENT:  Pt presents to PT with reports of continued R  groin pain. Has been compliant with HEP with no adverse effect. Pt is ready to begin PT at this time.    PAIN:  Are you having pain?  Yes: NPRS scale: 7/10 Pain location: R groin Pain description: grabbng Aggravating factors: walking Relieving factors: rest Low Back pain is 4/10 and better after injections   OBJECTIVE: (objective measures completed at initial evaluation unless otherwise dated)  PATIENT SURVEYS:  FOTO: Perceived function   34%, predicted   49%    COGNITION:           Overall cognitive status: Within functional limits for tasks assessed                          SENSATION: WFL   EDEMA:  None tested for the lower legs   POSTURE: flexed trunk    PALPATION: NT   LOWER EXTREMITY ROM:            Limited hip flexion due to pain Active ROM Right eval Left eval  Hip flexion      Hip extension      Hip abduction      Hip adduction      Hip internal rotation      Hip external rotation      Knee flexion      Knee extension      Ankle dorsiflexion      Ankle plantarflexion      Ankle inversion      Ankle eversion       (Blank rows = not tested)   LOWER EXTREMITY MMT:   MMT Right eval Left eval  Hip flexion 3 3  Hip extension 3 3  Hip abduction 3 3  Hip adduction 3+ 3+  Hip internal rotation      Hip external rotation 3 3  Knee flexion 4 4  Knee extension 4 4  Ankle dorsiflexion      Ankle plantarflexion      Ankle inversion      Ankle eversion       (Blank rows = not tested)   LOWER EXTREMITY SPECIAL TESTS:  NT   FUNCTIONAL TESTS:  5 times sit to stand: TBA 2 minute walk test: 172 feet 02/21/22 Sit to stand is labored   GAIT: Distance walked: 261f pt estimates his walking tolerance to 3071fAssistive device utilized: None Level of assistance: Complete Independence Comments: Antalgic gait pattern over the R LE    [facility]REAT[facility]                                                DATE:  03/13/2022 Therapeutic Exercise: NuStep lvl 6 LE x 5 min while taking subjective STS 2x10  LAQ 2x10 4# Supine SLR 2x15 each Supine ball squeeze 2x10 - 3" hold Supine clamshell 2x15 BTB Supine march 2x20 BTB Bridge 2x10 Standing hip abd/ext 2x10 each 2#  [facility]:                                                DATE: 03/06/2022 Therapeutic Exercise: NuStep lvl 5 UE/LE x 5 min while taking subjective STS 3x5  LAQ 2x10 3# Supine SLR 2x10 each Supine ball squeeze 2x10 - 3" hold Supine clamshell 2x15 BTB Supine march 2x10 GTB Bridge 2x10 Standing hip abd/ext 2x10 each  [facility]                                                DATE: 02/27/2022 Therapeutic Exercise: NuStep lvl 5 UE/LE x 5 min while taking subjective Supine ball squeeze 2x10 - 3" hold Supine clamshell 2x15 GTB Supine march 2x10 GTB Bridge 2x10 Supine SLR 2x10 each LAQ 2.5# 3x10  [facility] Adult PT Treatment:                                                DATE: 02/23/2022 Therapeutic Exercise: NuStep lvl 5 UE/LE x 5 min while taking subjective Supine clamshell 2x10 GTB Bridge x 10 Supine SLR 2x10 each Seated ball squeeze 2x10 - 5" hold  LTR x 10  [facility] Adult PT Treatment:                                                DATE: 02/21/2022 Therapeutic Exercise: 2MWT Clam AROM/ butterfly stretch Clam with green band x 10 Bridge x 10 March 10 x 2  LTR x 10   PATIENT EDUCATION:  Education details: HEP Person educated: Patient and Caregiver girl friend Education method: Explanation Education comprehension: verbalized understanding and needs further education   HOME EXERCISE PROGRAM: Access Code: [email or web address]: [email or web address] Date: 03/06/2022 Prepared by: DaOctavio MannsExercises - Supine Lower Trunk Rotation  - 1 x daily - 7 x weekly - 2 sets - 10 reps - Supine Bridge  - 1 x daily - 7 x weekly - 2 sets - 10 reps - Active Straight Leg Raise with Quad Set  - 1 x  daily - 7 x weekly - 2 sets - 10 reps - Hooklying Clamshell with Resistance  - 1 x daily - 7 x weekly - 2 sets - 15 reps - blue theraband hold - Standing Hip Abduction with Counter Support  - 1 x daily - 7 x weekly - 2 sets - 10 reps - Standing Hip Extension with Counter Support  - 1 x daily - 7 x weekly - 2 sets - 10 reps   ASSESSMENT:   CLINICAL IMPRESSION: Pt was able to complete all prescribed exercises with no adverse effect and improved activity tolerance. Therapy today continue to progress LE strength and endurance. He is progressing well with therapy, HEP updated for continued proximal strengthening. Will continue to progress as able per POC.     OBJECTIVE IMPAIRMENTS Abnormal gait, cardiopulmonary status limiting activity, decreased activity tolerance, decreased balance, decreased knowledge of condition, difficulty walking, decreased ROM, decreased strength, postural dysfunction, obesity, and pain.    ACTIVITY LIMITATIONS carrying, lifting, bending, standing, squatting, stairs, and locomotion level   PARTICIPATION LIMITATIONS: meal prep, cleaning, laundry, shopping, and community activity   PERSONAL FACTORS Fitness, Past/current experiences, Time since onset of injury/illness/exacerbation, and 3+ comorbidities:    Saddle pulmonary embolii, Heart failure, atrial arrhythmia, CHF, Afib, Respiratory failure, CHOLECYSTECTOMY-2019, COLON SURGERY-Cancer 2013, INCISIONAL HERNIA REPAIR, DM, low back pain, R groin pain, R flank pain, obesityare also affecting patient's functional outcome.      GOALS:   SHORT TERM GOALS: Target date: 02/24/2022  Pt will be Ind in an initial HEP Baseline: Not initiated Goal status: INITIAL   LONG TERM GOALS: Target date: 03/24/22    Pt will be Ind in a final HEP to maintain achieved LOF Baseline: Not initiated Goal status: INITIAL   2.  Increase hip strength to 4/5 and knee strength to 5/5 for improved functional ability and safety Baseline: see flow  sheets Goal status: INITIAL   3.  Improve 5xSTS by MCID of 5" and 2MWT by MCID of 37f as indication of improved functional mobility Baseline: 2 MWT 172 feet 02/21/22, 5 x STS TBA Goal status: INITIAL   4.  Pt's FOTO score will improved to the predicted value of 49% as indication of improved function Baseline: 34% Goal status: INITIAL   PLAN: PT FREQUENCY: 2x/week   PT DURATION: 6 weeks   PLANNED INTERVENTIONS: Therapeutic exercises, Therapeutic activity, Balance training, Gait training, Patient/Family education, Self Care, Joint mobilization, Cryotherapy, Moist heat, Taping, Manual therapy, and Re-evaluation   PLAN FOR NEXT SESSION: Review FOTO; initiate HEP; use of modalities, manual therapy; assess 5xSTS?  DWard ChattersPT  03/13/22 4:28 PM

## 2022-03-16 ENCOUNTER — Ambulatory Visit: Payer: Medicare Other

## 2022-03-16 DIAGNOSIS — R2689 Other abnormalities of gait and mobility: Secondary | ICD-10-CM

## 2022-03-16 DIAGNOSIS — M6281 Muscle weakness (generalized): Secondary | ICD-10-CM

## 2022-03-16 DIAGNOSIS — M5459 Other low back pain: Secondary | ICD-10-CM | POA: Diagnosis not present

## 2022-03-16 NOTE — Therapy (Signed)
OUTPATIENT PHYSICAL THERAPY TREATMENT NOTE   Patient Name: Raymond Allen MRN: [id number] DOB:1960-07-27, 61 y.o., male Today's Date: 03/16/2022  PCP: [place], Julie M, NP   REFERRING PROVIDER: Jolaine Artist, MD  END OF SESSION:   PT End of Session - 03/16/22 1713     Visit Number 7    Number of Visits 13    Date for PT Re-Evaluation 03/24/22    Authorization Type [employer] MEDICARE    Progress Note Due on Visit 10    PT Start Time 8527    PT Stop Time 1740    PT Time Calculation (min) 29 min    Activity Tolerance Patient tolerated treatment well                  Past Medical History:  Diagnosis Date   Cancer ([place])    Colon cancer metastasized to liver ([place]) 01/05/2012   Colon carcinoma ([place])    colon ca dx 2001;   Diabetes mellitus without complication ([place])    Hypertension    Muscle weakness-general 01/05/2012   Past Surgical History:  Procedure Laterality Date   CHOLECYSTECTOMY     COLON SURGERY     sigmoid colectomy, appendectomy   INCISIONAL HERNIA REPAIR N/A 11/27/2017   Procedure: LAPAROSCOPIC ASSISTED INCISIONAL HERNIA;  Surgeon: Clovis Riley, MD;  [facility];  Service: General;  Laterality: N/A;   INSERTION OF MESH N/A 11/27/2017   Procedure: INSERTION OF MESH;  Surgeon: Clovis Riley, MD;  [facility];  Service: General;  Laterality: N/A;   LAPAROSCOPIC LYSIS OF ADHESIONS N/A 11/27/2017   Procedure: LAPAROSCOPIC LYSIS OF ADHESIONS;  Surgeon: Clovis Riley, MD;  [facility];  Service: General;  Laterality: N/A;   LIVER SURGERY     partial removal duke in 2003   PORT-A-CATH REMOVAL Right 07/16/2012   Procedure: REMOVAL PORT-A-CATH;  Surgeon: Haywood Lasso, MD;  Location: [facility];  Service: General;  Laterality: Right;   RIGHT/LEFT HEART CATH AND CORONARY ANGIOGRAPHY N/A 04/01/2021   Procedure: RIGHT/LEFT HEART CATH AND CORONARY ANGIOGRAPHY;  Surgeon: Jolaine Artist, MD;  Location: [facility];  Service: Cardiovascular;  Laterality: N/A;   Patient Active Problem List   Diagnosis Date Noted   Chronic combined systolic and diastolic CHF (congestive heart failure) ([place]) 12/30/2021   Atrial fibrillation, chronic ([place]) 12/30/2021   Mixed hyperlipidemia 12/30/2021   Obesity (BMI 30-39.9) 12/30/2021   Pulmonary embolism ([place]) 12/29/2021   Physical deconditioning 78/24/2353   Acute systolic heart failure ([place]) 04/05/2021   Pulmonary edema 61/44/3154   Acute metabolic encephalopathy 00/86/7619   CKD (chronic kidney disease) stage 3, GFR 30-59 ml/min ([facility]) 04/05/2021   Atrial arrhythmia 04/05/2021   Respiratory distress 03/29/2021   Heart failure, type unknown ([place])    Type 2 diabetes mellitus without complication ([place])    Incisional hernia 11/27/2017   Colon cancer metastasized to liver ([place]) 01/05/2012   FLANK PAIN, RIGHT 01/21/2008   TOBACCO ABUSE 12/26/2007   HYPERGLYCEMIA 12/26/2007   PERS HX NONCOMPLIANCE W/MED TX PRS HAZARDS HLTH 12/26/2007   CARCINOMA, COLON 12/17/2007   Essential hypertension 12/17/2007   Respiratory failure, acute ([place]) 12/17/2007    REFERRING DIAG: R53.81 (ICD-10-CM) - Physical deconditioning  THERAPY DIAG:  Other low back pain  Muscle weakness (generalized)  Other abnormalities of gait and mobility  Rationale for Evaluation and Treatment rehabilitation  PERTINENT HISTORY: Saddle pulmonary embolii, Heart failure, atrial arrhythmia, CHF, Afib, Respiratory failure, CHOLECYSTECTOMY-2019,  COLON SURGERY-Cancer 2013, INCISIONAL HERNIA REPAIR, DM, low back pain, R groin pain, R flank pain, obesity  PRECAUTIONS: Other: Cardiopulmonary  SUBJECTIVE:                                                                                                                                                                                      SUBJECTIVE STATEMENT:  Pt presents to PT with continued pain in R LE. Has been compliant with HEP with no adverse effect.  Pt is ready to begin PT at this time.    PAIN:  Are you having pain?  Yes: NPRS scale: 7/10 Pain location: R groin Pain description: grabbng Aggravating factors: walking Relieving factors: rest Low Back pain is 4/10 and better after injections   OBJECTIVE: (objective measures completed at initial evaluation unless otherwise dated)  PATIENT SURVEYS:  FOTO: Perceived function   34%, predicted   49%    COGNITION:           Overall cognitive status: Within functional limits for tasks assessed                          SENSATION: WFL   EDEMA:  None tested for the lower legs   POSTURE: flexed trunk    PALPATION: NT   LOWER EXTREMITY ROM:            Limited hip flexion due to pain Active ROM Right eval Left eval  Hip flexion      Hip extension      Hip abduction      Hip adduction      Hip internal rotation      Hip external rotation      Knee flexion      Knee extension      Ankle dorsiflexion      Ankle plantarflexion      Ankle inversion      Ankle eversion       (Blank rows = not tested)   LOWER EXTREMITY MMT:   MMT Right eval Left eval  Hip flexion 3 3  Hip extension 3 3  Hip abduction 3 3  Hip adduction 3+ 3+  Hip internal rotation      Hip external rotation 3 3  Knee flexion 4 4  Knee extension 4 4  Ankle dorsiflexion      Ankle plantarflexion      Ankle inversion      Ankle eversion       (Blank rows = not tested)   LOWER EXTREMITY SPECIAL TESTS:  NT   FUNCTIONAL TESTS:  5 times sit to stand: TBA 2  minute walk test: 172 feet 02/21/22 Sit to stand is labored   GAIT: Distance walked: 24f pt estimates his walking tolerance to 3043fAssistive device utilized: None Level of assistance: Complete Independence Comments: Antalgic gait pattern over the R LE    [facility]REATMENT[facility]                                                DATE: 03/16/2022 Therapeutic Exercise: NuStep lvl 5 LE x 3 min while taking  subjective STS 3x10  LAQ 2x10 4# Supine SLR 2x15 each Supine ball squeeze 2x10 - 3" hold Supine clamshell 2x15 BTB Supine march 2x20 BTB Bridge 2x10 Standing hip abd/ext 2x10 each RTB  [facility] Adult PT Treatment:                                                DATE: 03/13/2022 Therapeutic Exercise: NuStep lvl 6 LE x 5 min while taking subjective STS 2x10  LAQ 2x10 4# Supine SLR 2x15 each Supine ball squeeze 2x10 - 3" hold Supine clamshell 2x15 BTB Supine march 2x20 BTB Bridge 2x10 Standing hip abd/ext 2x10 each 2#  [facility]:                                                DATE: 03/06/2022 Therapeutic Exercise: NuStep lvl 5 UE/LE x 5 min while taking subjective STS 3x5  LAQ 2x10 3# Supine SLR 2x10 each Supine ball squeeze 2x10 - 3" hold Supine clamshell 2x15 BTB Supine march 2x10 GTB Bridge 2x10 Standing hip abd/ext 2x10 each  [facility]                                                DATE: 02/27/2022 Therapeutic Exercise: NuStep lvl 5 UE/LE x 5 min while taking subjective Supine ball squeeze 2x10 - 3" hold Supine clamshell 2x15 GTB Supine march 2x10 GTB Bridge 2x10 Supine SLR 2x10 each LAQ 2.5# 3x10  [facility] Adult PT Treatment:                                                DATE: 02/23/2022 Therapeutic Exercise: NuStep lvl 5 UE/LE x 5 min while taking subjective Supine clamshell 2x10 GTB Bridge x 10 Supine SLR 2x10 each Seated ball squeeze 2x10 - 5" hold  LTR x 10  [facility] Adult PT Treatment:                                                DATE: 02/21/2022 Therapeutic Exercise: 2MWT Clam AROM/ butterfly stretch Clam with green band x 10 Bridge x 10 March 10 x 2  LTR x 10  PATIENT EDUCATION:  Education details: HEP Person educated: Patient and Caregiver girl friend Education method: Explanation Education comprehension: verbalized understanding and needs further education   HOME EXERCISE PROGRAM: Access Code: [email or web address] URL:  [email or web address] Date: 03/06/2022 Prepared by: Octavio Manns  Exercises - Supine Lower Trunk Rotation  - 1 x daily - 7 x weekly - 2 sets - 10 reps - Supine Bridge  - 1 x daily - 7 x weekly - 2 sets - 10 reps - Active Straight Leg Raise with Quad Set  - 1 x daily - 7 x weekly - 2 sets - 10 reps - Hooklying Clamshell with Resistance  - 1 x daily - 7 x weekly - 2 sets - 15 reps - blue theraband hold - Standing Hip Abduction with Counter Support  - 1 x daily - 7 x weekly - 2 sets - 10 reps - Standing Hip Extension with Counter Support  - 1 x daily - 7 x weekly - 2 sets - 10 reps   ASSESSMENT:   CLINICAL IMPRESSION: Pt was able to complete all prescribed exercises with no adverse effect and improved activity tolerance. Therapy today continue to progress LE strength and endurance. He is progressing well with therapy, HEP updated for continued proximal strengthening. Will continue to progress as able per POC.      OBJECTIVE IMPAIRMENTS Abnormal gait, cardiopulmonary status limiting activity, decreased activity tolerance, decreased balance, decreased knowledge of condition, difficulty walking, decreased ROM, decreased strength, postural dysfunction, obesity, and pain.    ACTIVITY LIMITATIONS carrying, lifting, bending, standing, squatting, stairs, and locomotion level   PARTICIPATION LIMITATIONS: meal prep, cleaning, laundry, shopping, and community activity   PERSONAL FACTORS Fitness, Past/current experiences, Time since onset of injury/illness/exacerbation, and 3+ comorbidities:    Saddle pulmonary embolii, Heart failure, atrial arrhythmia, CHF, Afib, Respiratory failure, CHOLECYSTECTOMY-2019, COLON SURGERY-Cancer 2013, INCISIONAL HERNIA REPAIR, DM, low back pain, R groin pain, R flank pain, obesityare also affecting patient's functional outcome.      GOALS:   SHORT TERM GOALS: Target date: 02/24/2022  Pt will be Ind in an initial HEP Baseline: Not initiated Goal status:  INITIAL   LONG TERM GOALS: Target date: 03/24/22    Pt will be Ind in a final HEP to maintain achieved LOF Baseline: Not initiated Goal status: INITIAL   2.  Increase hip strength to 4/5 and knee strength to 5/5 for improved functional ability and safety Baseline: see flow sheets Goal status: INITIAL   3.  Improve 5xSTS by MCID of 5" and 2MWT by MCID of 40f as indication of improved functional mobility Baseline: 2 MWT 172 feet 02/21/22, 5 x STS TBA Goal status: INITIAL   4.  Pt's FOTO score will improved to the predicted value of 49% as indication of improved function Baseline: 34% Goal status: INITIAL   PLAN: PT FREQUENCY: 2x/week   PT DURATION: 6 weeks   PLANNED INTERVENTIONS: Therapeutic exercises, Therapeutic activity, Balance training, Gait training, Patient/Family education, Self Care, Joint mobilization, Cryotherapy, Moist heat, Taping, Manual therapy, and Re-evaluation   PLAN FOR NEXT SESSION: Review FOTO; initiate HEP; use of modalities, manual therapy; assess 5xSTS?  DWard ChattersPT  03/16/22 5:42 PM

## 2022-03-20 ENCOUNTER — Ambulatory Visit: Payer: Medicare Other | Attending: Internal Medicine

## 2022-03-20 DIAGNOSIS — M5459 Other low back pain: Secondary | ICD-10-CM

## 2022-03-20 DIAGNOSIS — R2689 Other abnormalities of gait and mobility: Secondary | ICD-10-CM | POA: Diagnosis present

## 2022-03-20 DIAGNOSIS — M6281 Muscle weakness (generalized): Secondary | ICD-10-CM | POA: Diagnosis present

## 2022-03-20 DIAGNOSIS — R262 Difficulty in walking, not elsewhere classified: Secondary | ICD-10-CM

## 2022-03-20 NOTE — Therapy (Signed)
OUTPATIENT PHYSICAL THERAPY TREATMENT NOTE   Patient Name: Raymond Allen MRN: [id number] DOB:1960-10-27, 61 y.o., male Today's Date: 03/20/2022  PCP: Martinique, Julie M, NP   REFERRING PROVIDER: Jolaine Artist, MD  END OF SESSION:   PT End of Session - 03/20/22 1534     Visit Number 8    Number of Visits 13    Date for PT Re-Evaluation 03/24/22    Authorization Type [employer] MEDICARE    Progress Note Due on Visit 10    PT Start Time 1532    PT Stop Time 1612    PT Time Calculation (min) 40 min    Activity Tolerance Patient tolerated treatment well                   Past Medical History:  Diagnosis Date   Cancer ([place])    Colon cancer metastasized to liver ([place]) 01/05/2012   Colon carcinoma ([place])    colon ca dx 2001;   Diabetes mellitus without complication ([place])    Hypertension    Muscle weakness-general 01/05/2012   Past Surgical History:  Procedure Laterality Date   CHOLECYSTECTOMY     COLON SURGERY     sigmoid colectomy, appendectomy   INCISIONAL HERNIA REPAIR N/A 11/27/2017   Procedure: LAPAROSCOPIC ASSISTED INCISIONAL HERNIA;  Surgeon: Clovis Riley, MD;  Location: [facility];  Service: General;  Laterality: N/A;   INSERTION OF MESH N/A 11/27/2017   Procedure: INSERTION OF MESH;  Surgeon: Clovis Riley, MD;  [facility]: [facility];  Service: General;  Laterality: N/A;   LAPAROSCOPIC LYSIS OF ADHESIONS N/A 11/27/2017   Procedure: LAPAROSCOPIC LYSIS OF ADHESIONS;  Surgeon: Clovis Riley, MD;  [facility];  Service: General;  Laterality: N/A;   LIVER SURGERY     partial removal duke in 2003   PORT-A-CATH REMOVAL Right 07/16/2012   Procedure: REMOVAL PORT-A-CATH;  Surgeon: Haywood Lasso, MD;  Location: [facility];  Service: General;  Laterality: Right;   RIGHT/LEFT HEART CATH AND CORONARY ANGIOGRAPHY N/A 04/01/2021   Procedure: RIGHT/LEFT HEART CATH AND CORONARY ANGIOGRAPHY;  Surgeon: Jolaine Artist, MD;  Location: [facility];  Service: Cardiovascular;  Laterality: N/A;   Patient Active Problem List   Diagnosis Date Noted   Chronic combined systolic and diastolic CHF (congestive heart failure) ([place]) 12/30/2021   Atrial fibrillation, chronic ([place]) 12/30/2021   Mixed hyperlipidemia 12/30/2021   Obesity (BMI 30-39.9) 12/30/2021   Pulmonary embolism ([place]) 12/29/2021   Physical deconditioning 46/56/8127   Acute systolic heart failure ([place]) 04/05/2021   Pulmonary edema 51/70/0174   Acute metabolic encephalopathy 94/49/6759   CKD (chronic kidney disease) stage 3, GFR 30-59 ml/min (H[facility]) 04/05/2021   Atrial arrhythmia 04/05/2021   Respiratory distress 03/29/2021   Heart failure, type unknown ([place])    Type 2 diabetes mellitus without complication ([place])    Incisional hernia 11/27/2017   Colon cancer metastasized to liver ([place]) 01/05/2012   FLANK PAIN, RIGHT 01/21/2008   TOBACCO ABUSE 12/26/2007   HYPERGLYCEMIA 12/26/2007   PERS HX NONCOMPLIANCE W/MED TX PRS HAZARDS HLTH 12/26/2007   CARCINOMA, COLON 12/17/2007   Essential hypertension 12/17/2007   Respiratory failure, acute ([place]) 12/17/2007    REFERRING DIAG: R53.81 (ICD-10-CM) - Physical deconditioning  THERAPY DIAG:  Other low back pain  Muscle weakness (generalized)  Other abnormalities of gait and mobility  Difficulty in walking, not elsewhere classified  Rationale for Evaluation and Treatment rehabilitation  PERTINENT HISTORY: Saddle pulmonary embolii, Heart  failure, atrial arrhythmia, CHF, Afib, Respiratory failure, CHOLECYSTECTOMY-2019, COLON SURGERY-Cancer 2013, INCISIONAL HERNIA REPAIR, DM, low back pain, R groin pain, R flank pain, obesity  PRECAUTIONS: Other: Cardiopulmonary  SUBJECTIVE:                                                                                                                                                                                      SUBJECTIVE STATEMENT:  Pt presents to PT with continued pain in R LE.  Has been compliant with HEP with no adverse effect. Pt is ready to begin PT at this time.    PAIN:  Are you having pain?  Yes: NPRS scale: 7/10 Pain location: R groin Pain description: grabbng Aggravating factors: walking Relieving factors: rest Low Back pain is 4/10 and better after injections   OBJECTIVE: (objective measures completed at initial evaluation unless otherwise dated)  PATIENT SURVEYS:  FOTO: Perceived function   34%, predicted   49%    COGNITION:           Overall cognitive status: Within functional limits for tasks assessed                          SENSATION: WFL   EDEMA:  None tested for the lower legs   POSTURE: flexed trunk    PALPATION: NT   LOWER EXTREMITY ROM:            Limited hip flexion due to pain Active ROM Right eval Left eval  Hip flexion      Hip extension      Hip abduction      Hip adduction      Hip internal rotation      Hip external rotation      Knee flexion      Knee extension      Ankle dorsiflexion      Ankle plantarflexion      Ankle inversion      Ankle eversion       (Blank rows = not tested)   LOWER EXTREMITY MMT:   MMT Right eval Left eval  Hip flexion 3 3  Hip extension 3 3  Hip abduction 3 3  Hip adduction 3+ 3+  Hip internal rotation      Hip external rotation 3 3  Knee flexion 4 4  Knee extension 4 4  Ankle dorsiflexion      Ankle plantarflexion      Ankle inversion      Ankle eversion       (Blank rows = not tested)   LOWER EXTREMITY SPECIAL TESTS:  NT   FUNCTIONAL TESTS:  5 times sit to stand: TBA 2 minute walk test: 172 feet 02/21/22 Sit to stand is labored   GAIT: Distance walked: 272f pt estimates his walking tolerance to 3051fAssistive device utilized: None Level of assistance: Complete Independence Comments: Antalgic gait pattern over the R LE    [facility]                                                DATE: 03/20/2022 Therapeutic  Exercise: NuStep lvl 5 LE x 5 min while taking subjective STS 3x10  Supine SLR 2x15 each Supine ball squeeze 2x10 - 3" hold Supine clamshell 2x15 Black TB Supine march 2x20 Black TB Bridge 3x10 Seated knee ext 3x10 10# Seated hamstring curl 2x10 35# Lateral walk RTB 3 laps at counter Standing hip abd/ext 2x10 each RTB  [facility] Adult PT Treatment:                                                DATE: 03/16/2022 Therapeutic Exercise: NuStep lvl 5 LE x 3 min while taking subjective STS 3x10  LAQ 2x10 4# Supine SLR 2x15 each Supine ball squeeze 2x10 - 3" hold Supine clamshell 2x15 BTB Supine march 2x20 BTB Bridge 2x10 Standing hip abd/ext 2x10 each RTB  [facility] Adult PT Treatment:                                                DATE: 03/13/2022 Therapeutic Exercise: NuStep lvl 6 LE x 5 min while taking subjective STS 2x10  LAQ 2x10 4# Supine SLR 2x15 each Supine ball squeeze 2x10 - 3" hold Supine clamshell 2x15 BTB Supine march 2x20 BTB Bridge 2x10 Standing hip abd/ext 2x10 each 2#  [facility]:                                                DATE: 03/06/2022 Therapeutic Exercise: NuStep lvl 5 UE/LE x 5 min while taking subjective STS 3x5  LAQ 2x10 3# Supine SLR 2x10 each Supine ball squeeze 2x10 - 3" hold Supine clamshell 2x15 BTB Supine march 2x10 GTB Bridge 2x10 Standing hip abd/ext 2x10 each  [facility]                                                DATE: 02/27/2022 Therapeutic Exercise: NuStep lvl 5 UE/LE x 5 min while taking subjective Supine ball squeeze 2x10 - 3" hold Supine clamshell 2x15 GTB Supine march 2x10 GTB Bridge 2x10 Supine SLR 2x10 each LAQ 2.5# 3x10  [facility] Adult PT Treatment:  DATE: 02/23/2022 Therapeutic Exercise: NuStep lvl 5 UE/LE x 5 min while taking subjective Supine clamshell 2x10 GTB Bridge x 10 Supine SLR 2x10 each Seated ball squeeze 2x10 - 5" hold  LTR x 10  [facility]  Adult PT Treatment:                                                DATE: 02/21/2022 Therapeutic Exercise: 2MWT Clam AROM/ butterfly stretch Clam with green band x 10 Bridge x 10 March 10 x 2  LTR x 10   PATIENT EDUCATION:  Education details: HEP Person educated: Patient and Caregiver girl friend Education method: Explanation Education comprehension: verbalized understanding and needs further education   HOME EXERCISE PROGRAM: Access Code: [email or web address] URL: [email or web address] Date: 03/06/2022 Prepared by: Octavio Manns  Exercises - Supine Lower Trunk Rotation  - 1 x daily - 7 x weekly - 2 sets - 10 reps - Supine Bridge  - 1 x daily - 7 x weekly - 2 sets - 10 reps - Active Straight Leg Raise with Quad Set  - 1 x daily - 7 x weekly - 2 sets - 10 reps - Hooklying Clamshell with Resistance  - 1 x daily - 7 x weekly - 2 sets - 15 reps - blue theraband hold - Standing Hip Abduction with Counter Support  - 1 x daily - 7 x weekly - 2 sets - 10 reps - Standing Hip Extension with Counter Support  - 1 x daily - 7 x weekly - 2 sets - 10 reps   ASSESSMENT:   CLINICAL IMPRESSION: Pt was able to complete all prescribed exercises with no adverse effect and improved activity tolerance. Therapy today continue to progress LE strength and endurance. He is progressing well with therapy, HEP updated for continued proximal strengthening. Will continue to progress as able per POC.       OBJECTIVE IMPAIRMENTS Abnormal gait, cardiopulmonary status limiting activity, decreased activity tolerance, decreased balance, decreased knowledge of condition, difficulty walking, decreased ROM, decreased strength, postural dysfunction, obesity, and pain.    ACTIVITY LIMITATIONS carrying, lifting, bending, standing, squatting, stairs, and locomotion level   PARTICIPATION LIMITATIONS: meal prep, cleaning, laundry, shopping, and community activity   PERSONAL FACTORS Fitness, Past/current experiences, Time  since onset of injury/illness/exacerbation, and 3+ comorbidities:    Saddle pulmonary embolii, Heart failure, atrial arrhythmia, CHF, Afib, Respiratory failure, CHOLECYSTECTOMY-2019, COLON SURGERY-Cancer 2013, INCISIONAL HERNIA REPAIR, DM, low back pain, R groin pain, R flank pain, obesityare also affecting patient's functional outcome.      GOALS:   SHORT TERM GOALS: Target date: 02/24/2022  Pt will be Ind in an initial HEP Baseline: Not initiated Goal status: INITIAL   LONG TERM GOALS: Target date: 03/24/22    Pt will be Ind in a final HEP to maintain achieved LOF Baseline: Not initiated Goal status: INITIAL   2.  Increase hip strength to 4/5 and knee strength to 5/5 for improved functional ability and safety Baseline: see flow sheets Goal status: INITIAL   3.  Improve 5xSTS by MCID of 5" and 2MWT by MCID of 68f as indication of improved functional mobility Baseline: 2 MWT 172 feet 02/21/22, 5 x STS TBA Goal status: INITIAL   4.  Pt's FOTO score will improved to the predicted value of 49% as indication of improved function Baseline: 34% Goal status: INITIAL  PLAN: PT FREQUENCY: 2x/week   PT DURATION: 6 weeks   PLANNED INTERVENTIONS: Therapeutic exercises, Therapeutic activity, Balance training, Gait training, Patient/Family education, Self Care, Joint mobilization, Cryotherapy, Moist heat, Taping, Manual therapy, and Re-evaluation   PLAN FOR NEXT SESSION: Review FOTO; initiate HEP; use of modalities, manual therapy; assess 5xSTS?  Ward Chatters PT  03/20/22 4:52 PM

## 2022-03-23 ENCOUNTER — Ambulatory Visit: Payer: Medicare Other

## 2022-03-23 DIAGNOSIS — R262 Difficulty in walking, not elsewhere classified: Secondary | ICD-10-CM

## 2022-03-23 DIAGNOSIS — R2689 Other abnormalities of gait and mobility: Secondary | ICD-10-CM

## 2022-03-23 DIAGNOSIS — M6281 Muscle weakness (generalized): Secondary | ICD-10-CM

## 2022-03-23 DIAGNOSIS — M5459 Other low back pain: Secondary | ICD-10-CM | POA: Diagnosis not present

## 2022-03-23 NOTE — Therapy (Signed)
OUTPATIENT PHYSICAL THERAPY TREATMENT NOTE   Patient Name: Raymond Allen MRN: [id number] DOB:08-02-1960, 61 y.o., male Today's Date: 03/23/2022  PCP: [place], Julie M, NP   REFERRING PROVIDER: Jolaine Artist, MD  END OF SESSION:   PT End of Session - 03/23/22 1716     Visit Number 9    Number of Visits 21    Date for PT Re-Evaluation 05/04/22    Authorization Type [employer] MEDICARE    Progress Note Due on Visit 10    PT Start Time 5364    PT Stop Time 6803    PT Time Calculation (min) 40 min    Activity Tolerance Patient tolerated treatment well                    Past Medical History:  Diagnosis Date   Cancer ([place])    Colon cancer metastasized to liver ([place]) 01/05/2012   Colon carcinoma ([place])    colon ca dx 2001;   Diabetes mellitus without complication ([place])    Hypertension    Muscle weakness-general 01/05/2012   Past Surgical History:  Procedure Laterality Date   CHOLECYSTECTOMY     COLON SURGERY     sigmoid colectomy, appendectomy   INCISIONAL HERNIA REPAIR N/A 11/27/2017   Procedure: LAPAROSCOPIC ASSISTED INCISIONAL HERNIA;  Surgeon: Clovis Riley, MD;  [facility];  Service: General;  Laterality: N/A;   INSERTION OF MESH N/A 11/27/2017   Procedure: INSERTION OF MESH;  Surgeon: Clovis Riley, MD;  [facility];  Service: General;  Laterality: N/A;   LAPAROSCOPIC LYSIS OF ADHESIONS N/A 11/27/2017   Procedure: LAPAROSCOPIC LYSIS OF ADHESIONS;  Surgeon: Clovis Riley, MD;  [facility];  Service: General;  Laterality: N/A;   LIVER SURGERY     partial removal duke in 2003   PORT-A-CATH REMOVAL Right 07/16/2012   Procedure: REMOVAL PORT-A-CATH;  Surgeon: Haywood Lasso, MD;  Location: [facility];  Service: General;  Laterality: Right;   RIGHT/LEFT HEART CATH AND CORONARY ANGIOGRAPHY N/A 04/01/2021   Procedure: RIGHT/LEFT HEART CATH AND CORONARY ANGIOGRAPHY;  Surgeon: Jolaine Artist, MD;  Location: [facility];  Service: Cardiovascular;  Laterality: N/A;   Patient Active Problem List   Diagnosis Date Noted   Chronic combined systolic and diastolic CHF (congestive heart failure) ([place]) 12/30/2021   Atrial fibrillation, chronic ([place]) 12/30/2021   Mixed hyperlipidemia 12/30/2021   Obesity (BMI 30-39.9) 12/30/2021   Pulmonary embolism ([place]) 12/29/2021   Physical deconditioning 21/22/4825   Acute systolic heart failure ([place]) 04/05/2021   Pulmonary edema 00/37/0488   Acute metabolic encephalopathy 89/16/9450   CKD (chronic kidney disease) stage 3, GFR 30-59 ml/min ([facility]) 04/05/2021   Atrial arrhythmia 04/05/2021   Respiratory distress 03/29/2021   Heart failure, type unknown ([place])    Type 2 diabetes mellitus without complication ([place])    Incisional hernia 11/27/2017   Colon cancer metastasized to liver ([place]) 01/05/2012   FLANK PAIN, RIGHT 01/21/2008   TOBACCO ABUSE 12/26/2007   HYPERGLYCEMIA 12/26/2007   PERS HX NONCOMPLIANCE W/MED TX PRS HAZARDS HLTH 12/26/2007   CARCINOMA, COLON 12/17/2007   Essential hypertension 12/17/2007   Respiratory failure, acute ([place]) 12/17/2007    REFERRING DIAG: R53.81 (ICD-10-CM) - Physical deconditioning  THERAPY DIAG:  Other low back pain - Plan: PT plan of care cert/re-cert  Muscle weakness (generalized) - Plan: PT plan of care cert/re-cert  Other abnormalities of gait and mobility - Plan: PT plan of care  cert/re-cert  Difficulty in walking, not elsewhere classified - Plan: PT plan of care cert/re-cert  Rationale for Evaluation and Treatment rehabilitation  PERTINENT HISTORY: Saddle pulmonary embolii, Heart failure, atrial arrhythmia, CHF, Afib, Respiratory failure, CHOLECYSTECTOMY-2019, COLON SURGERY-Cancer 2013, INCISIONAL HERNIA REPAIR, DM, low back pain, R groin pain, R flank pain, obesity  PRECAUTIONS: Other: Cardiopulmonary  SUBJECTIVE:                                                                                                                                                                                       SUBJECTIVE STATEMENT:  Pt presents to PT with continued R groin and LE pain. Has been compliant with HEP with no adverse effect. Pt is ready to begin PT at this time.   PAIN:  Are you having pain?  Yes: NPRS scale: 7/10 Pain location: R groin Pain description: grabbng Aggravating factors: walking Relieving factors: rest Low Back pain is 4/10 and better after injections   OBJECTIVE: (objective measures completed at initial evaluation unless otherwise dated)  PATIENT SURVEYS:  FOTO: Perceived function   34%, predicted   49%    COGNITION:           Overall cognitive status: Within functional limits for tasks assessed                          SENSATION: WFL   EDEMA:  None tested for the lower legs   POSTURE: flexed trunk    PALPATION: NT   LOWER EXTREMITY ROM:            Limited hip flexion due to pain Active ROM Right eval Left eval  Hip flexion      Hip extension      Hip abduction      Hip adduction      Hip internal rotation      Hip external rotation      Knee flexion      Knee extension      Ankle dorsiflexion      Ankle plantarflexion      Ankle inversion      Ankle eversion       (Blank rows = not tested)   LOWER EXTREMITY MMT:   MMT Right eval Left eval  Hip flexion 3 3  Hip extension 3 3  Hip abduction 3 3  Hip adduction 3+ 3+  Hip internal rotation      Hip external rotation 3 3  Knee flexion 4 4  Knee extension 4 4  Ankle dorsiflexion      Ankle plantarflexion      Ankle inversion  Ankle eversion       (Blank rows = not tested)   LOWER EXTREMITY SPECIAL TESTS:  NT   FUNCTIONAL TESTS:  5 times sit to stand: TBA 2 minute walk test: 172 feet 02/21/22 Sit to stand is labored   GAIT: Distance walked: 237f pt estimates his walking tolerance to 3040fAssistive device utilized: None Level of assistance: Complete Independence Comments: Antalgic gait  pattern over the R LE    [facility]RE[facility]                                                DATE: 03/23/2022 Therapeutic Exercise: NuStep lvl 6 LE x 5 min while taking subjective STS 3x10 Bridge 3x10  Supine SLR 2x15 each Seated pilates ring squeeze 2x10 - 3" hold Seated clamshell 2x20 Black TB Seated knee ext 3x10 10# Seated hamstring curl 2x10 35# Lateral walk RTB 3 laps at counter Standing hip abd/ext 2x10 each RTB  [facility] Adult PT Treatment:                                                DATE: 03/20/2022 Therapeutic Exercise: NuStep lvl 5 LE x 5 min while taking subjective STS 3x10  Supine SLR 2x15 each Supine ball squeeze 2x10 - 3" hold Supine clamshell 2x15 Black TB Supine march 2x20 Black TB Bridge 3x10 Seated knee ext 3x10 10# Seated hamstring curl 2x10 35# Lateral walk RTB 3 laps at counter Standing hip abd/ext 2x10 each RTB  [facility] Adult PT Treatment:                                                DATE: 03/16/2022 Therapeutic Exercise: NuStep lvl 5 LE x 3 min while taking subjective STS 3x10  LAQ 2x10 4# Supine SLR 2x15 each Supine ball squeeze 2x10 - 3" hold Supine clamshell 2x15 BTB Supine march 2x20 BTB Bridge 2x10 Standing hip abd/ext 2x10 each RTB  [facility] Adult PT Treatment:                                                DATE: 03/13/2022 Therapeutic Exercise: NuStep lvl 6 LE x 5 min while taking subjective STS 2x10  LAQ 2x10 4# Supine SLR 2x15 each Supine ball squeeze 2x10 - 3" hold Supine clamshell 2x15 BTB Supine march 2x20 BTB Bridge 2x10 Standing hip abd/ext 2x10 each 2#  [facility]:                                                DATE: 03/06/2022 Therapeutic Exercise: NuStep lvl 5 UE/LE x 5 min while taking subjective STS 3x5  LAQ 2x10 3# Supine SLR 2x10 each Supine ball squeeze 2x10 - 3" hold Supine clamshell 2x15 BTB Supine march 2x10 GTB Bridge 2x10 Standing hip abd/ext 2x10  each  [facility] Adult PT  Treatment:                                                DATE: 02/27/2022 Therapeutic Exercise: NuStep lvl 5 UE/LE x 5 min while taking subjective Supine ball squeeze 2x10 - 3" hold Supine clamshell 2x15 GTB Supine march 2x10 GTB Bridge 2x10 Supine SLR 2x10 each LAQ 2.5# 3x10  [facility] Adult PT Treatment:                                                DATE: 02/23/2022 Therapeutic Exercise: NuStep lvl 5 UE/LE x 5 min while taking subjective Supine clamshell 2x10 GTB Bridge x 10 Supine SLR 2x10 each Seated ball squeeze 2x10 - 5" hold  LTR x 10  [facility] Adult PT Treatment:                                                DATE: 02/21/2022 Therapeutic Exercise: 2MWT Clam AROM/ butterfly stretch Clam with green band x 10 Bridge x 10 March 10 x 2  LTR x 10   PATIENT EDUCATION:  Education details: HEP Person educated: Patient and Caregiver girl friend Education method: Explanation Education comprehension: verbalized understanding and needs further education   HOME EXERCISE PROGRAM: Access Code: [email or web address] URL: [email or web address] Date: 03/06/2022 Prepared by: Octavio Manns  Exercises - Supine Lower Trunk Rotation  - 1 x daily - 7 x weekly - 2 sets - 10 reps - Supine Bridge  - 1 x daily - 7 x weekly - 2 sets - 10 reps - Active Straight Leg Raise with Quad Set  - 1 x daily - 7 x weekly - 2 sets - 10 reps - Hooklying Clamshell with Resistance  - 1 x daily - 7 x weekly - 2 sets - 15 reps - blue theraband hold - Standing Hip Abduction with Counter Support  - 1 x daily - 7 x weekly - 2 sets - 10 reps - Standing Hip Extension with Counter Support  - 1 x daily - 7 x weekly - 2 sets - 10 reps   ASSESSMENT:   CLINICAL IMPRESSION: Pt was once again able to complete all prescribed exercises with no adverse effect. He continues to improve with therapy, showing improving strength, functional mobility, and activity tolerance. He would continue to benefit from extension of skilled PT  services. Will continue to progress pt as tolerated per POC.     OBJECTIVE IMPAIRMENTS Abnormal gait, cardiopulmonary status limiting activity, decreased activity tolerance, decreased balance, decreased knowledge of condition, difficulty walking, decreased ROM, decreased strength, postural dysfunction, obesity, and pain.    ACTIVITY LIMITATIONS carrying, lifting, bending, standing, squatting, stairs, and locomotion level   PARTICIPATION LIMITATIONS: meal prep, cleaning, laundry, shopping, and community activity   PERSONAL FACTORS Fitness, Past/current experiences, Time since onset of injury/illness/exacerbation, and 3+ comorbidities:    Saddle pulmonary embolii, Heart failure, atrial arrhythmia, CHF, Afib, Respiratory failure, CHOLECYSTECTOMY-2019, COLON SURGERY-Cancer 2013, INCISIONAL HERNIA REPAIR, DM, low back pain, R groin pain, R flank pain, obesityare also affecting patient's functional outcome.  GOALS:   SHORT TERM GOALS: Target date: 02/24/2022  Pt will be Ind in an initial HEP Baseline: Not initiated Goal status: MET   LONG TERM GOALS: Target date: 05/04/2022    Pt will be Ind in a final HEP to maintain achieved LOF Baseline: Not initiated Goal status: ONGOING   2.  Increase hip strength to 4/5 and knee strength to 5/5 for improved functional ability and safety Baseline: see flow sheets Goal status: ONGOING   3.  Improve 5xSTS by MCID of 5" and 2MWT by MCID of 46f as indication of improved functional mobility Baseline: 2 MWT 172 feet 02/21/22, 5 x STS TBA Goal status: ONGOING   4.  Pt's FOTO score will improved to the predicted value of 49% as indication of improved function Baseline: 34% Goal status: ONGOING   PLAN: PT FREQUENCY: 2x/week   PT DURATION: 6 weeks   PLANNED INTERVENTIONS: Therapeutic exercises, Therapeutic activity, Balance training, Gait training, Patient/Family education, Self Care, Joint mobilization, Cryotherapy, Moist heat, Taping, Manual  therapy, and Re-evaluation   PLAN FOR NEXT SESSION: Review FOTO; initiate HEP; use of modalities, manual therapy; assess 5xSTS?  DWard ChattersPT  03/23/22 6:04 PM

## 2022-03-30 ENCOUNTER — Ambulatory Visit: Payer: Medicare Other

## 2022-03-30 DIAGNOSIS — M6281 Muscle weakness (generalized): Secondary | ICD-10-CM

## 2022-03-30 DIAGNOSIS — M5459 Other low back pain: Secondary | ICD-10-CM | POA: Diagnosis not present

## 2022-03-30 DIAGNOSIS — R2689 Other abnormalities of gait and mobility: Secondary | ICD-10-CM

## 2022-03-30 NOTE — Therapy (Signed)
OUTPATIENT PHYSICAL THERAPY TREATMENT NOTE/PROGRESS NOTE  Progress Note Reporting Period 02/02/2022 to 03/30/2022  See note below for Objective Data and Assessment of Progress/Goals.     Patient Name: [name] MRN: [id number] DOB:[birth year], 61 y.o., male Today's Date: 03/30/2022  PCP: [place], Julie M, NP   REFERRING PROVIDER: Jolaine Artist, MD  END OF SESSION:   PT End of Session - 03/30/22 1708     Visit Number 10    Number of Visits 21    Date for PT Re-Evaluation 05/04/22    Authorization Type [employer] MEDICARE    Progress Note Due on Visit 10    PT Start Time 1706    PT Stop Time 9233    PT Time Calculation (min) 38 min    Activity Tolerance Patient tolerated treatment well                     Past Medical History:  Diagnosis Date   Cancer ([place])    Colon cancer metastasized to liver ([place]) 01/05/2012   Colon carcinoma ([place])    colon ca dx 2001;   Diabetes mellitus without complication ([place])    Hypertension    Muscle weakness-general 01/05/2012   Past Surgical History:  Procedure Laterality Date   CHOLECYSTECTOMY     COLON SURGERY     sigmoid colectomy, appendectomy   INCISIONAL HERNIA REPAIR N/A 11/27/2017   Procedure: LAPAROSCOPIC ASSISTED INCISIONAL HERNIA;  Surgeon: Clovis Riley, MD;  Location: [facility];  Service: General;  Laterality: N/A;   INSERTION OF MESH N/A 11/27/2017   Procedure: INSERTION OF MESH;  Surgeon: Clovis Riley, MD;  Location: [facility];  Service: General;  Laterality: N/A;   LAPAROSCOPIC LYSIS OF ADHESIONS N/A 11/27/2017   Procedure: LAPAROSCOPIC LYSIS OF ADHESIONS;  Surgeon: Clovis Riley, MD;  [facility]: [facility];  Service: General;  Laterality: N/A;   LIVER SURGERY     partial removal duke in 2003   PORT-A-CATH REMOVAL Right 07/16/2012   Procedure: REMOVAL PORT-A-CATH;  Surgeon: Haywood Lasso, MD;  Location: [facility];  Service: General;  Laterality: Right;   RIGHT/LEFT HEART CATH AND  CORONARY ANGIOGRAPHY N/A 04/01/2021   Procedure: RIGHT/LEFT HEART CATH AND CORONARY ANGIOGRAPHY;  Surgeon: Jolaine Artist, MD;  Location: [facility];  Service: Cardiovascular;  Laterality: N/A;   Patient Active Problem List   Diagnosis Date Noted   Chronic combined systolic and diastolic CHF (congestive heart failure) ([place]) 12/30/2021   Atrial fibrillation, chronic ([place]) 12/30/2021   Mixed hyperlipidemia 12/30/2021   Obesity (BMI 30-39.9) 12/30/2021   Pulmonary embolism ([place]) 12/29/2021   Physical deconditioning 00/76/2263   Acute systolic heart failure ([place]) 04/05/2021   Pulmonary edema 33/54/5625   Acute metabolic encephalopathy 63/89/3734   CKD (chronic kidney disease) stage 3, GFR 30-59 ml/min (H[facility]) 04/05/2021   Atrial arrhythmia 04/05/2021   Respiratory distress 03/29/2021   Heart failure, type unknown ([place])    Type 2 diabetes mellitus without complication ([place])    Incisional hernia 11/27/2017   Colon cancer metastasized to liver ([place]) 01/05/2012   FLANK PAIN, RIGHT 01/21/2008   TOBACCO ABUSE 12/26/2007   HYPERGLYCEMIA 12/26/2007   PERS HX NONCOMPLIANCE W/MED TX PRS HAZARDS HLTH 12/26/2007   CARCINOMA, COLON 12/17/2007   Essential hypertension 12/17/2007   Respiratory failure, acute ([place]) 12/17/2007    REFERRING DIAG: R53.81 (ICD-10-CM) - Physical deconditioning  THERAPY DIAG:  Other low back pain  Muscle weakness (generalized)  Other abnormalities of  gait and mobility  Rationale for Evaluation and Treatment rehabilitation  PERTINENT HISTORY: Saddle pulmonary embolii, Heart failure, atrial arrhythmia, CHF, Afib, Respiratory failure, CHOLECYSTECTOMY-2019, COLON SURGERY-Cancer 2013, INCISIONAL HERNIA REPAIR, DM, low back pain, R groin pain, R flank pain, obesity  PRECAUTIONS: Other: Cardiopulmonary  SUBJECTIVE:                                                                                                                                                                                       SUBJECTIVE STATEMENT:  Pt presents to PT with reports of continued L LE pain and swelling. Has been fairly compliant with HEP with no adverse effect. Pt is ready to begin PT at this time.    PAIN:  Are you having pain?  Yes: NPRS scale: 7/10 Pain location: R groin Pain description: grabbng Aggravating factors: walking Relieving factors: rest Low Back pain is 4/10 and better after injections   OBJECTIVE: (objective measures completed at initial evaluation unless otherwise dated)  PATIENT SURVEYS:  FOTO: Perceived function   34%, predicted   49%    COGNITION:           Overall cognitive status: Within functional limits for tasks assessed                          SENSATION: WFL   EDEMA:  None tested for the lower legs   POSTURE: flexed trunk    PALPATION: NT   LOWER EXTREMITY ROM:            Limited hip flexion due to pain Active ROM Right eval Left eval  Hip flexion      Hip extension      Hip abduction      Hip adduction      Hip internal rotation      Hip external rotation      Knee flexion      Knee extension      Ankle dorsiflexion      Ankle plantarflexion      Ankle inversion      Ankle eversion       (Blank rows = not tested)   LOWER EXTREMITY MMT:   MMT Right eval Left eval  Hip flexion 3 3  Hip extension 3 3  Hip abduction 3 3  Hip adduction 3+ 3+  Hip internal rotation      Hip external rotation 3 3  Knee flexion 4 4  Knee extension 4 4  Ankle dorsiflexion      Ankle plantarflexion      Ankle inversion      Ankle eversion       (  Blank rows = not tested)   LOWER EXTREMITY SPECIAL TESTS:  NT   FUNCTIONAL TESTS:  5 times sit to stand: 15 secs 2 minute walk test: 172 feet 02/21/22   GAIT: Distance walked: 211f pt estimates his walking tolerance to 3052fAssistive device utilized: None Level of assistance: Complete Independence Comments: Antalgic gait pattern over the R LE    [facility]                                                DATE: 03/30/2022 Therapeutic Exercise: NuStep lvl 6 LE x 5 min while taking subjective STS 2x10 - low table Bridge 2x15 Supine SLR 2x15 each Supine clamshell BTB 2x20 Seated knee ext 3x10 15# Seated hamstring curl 2x10 35# Lateral walk RTB 3 laps at counter Standing hip abd/ext 2x10 each RTB  [facility] Adult PT Treatment:                                                DATE: 03/23/2022 Therapeutic Exercise: NuStep lvl 6 LE x 5 min while taking subjective STS 3x10 Bridge 3x10  Supine SLR 2x15 each Seated pilates ring squeeze 2x10 - 3" hold Seated clamshell 2x20 Black TB Seated knee ext 3x10 10# Seated hamstring curl 2x10 35# Lateral walk RTB 3 laps at counter Standing hip abd/ext 2x10 each RTB  [facility] Adult PT Treatment:                                                DATE: 03/20/2022 Therapeutic Exercise: NuStep lvl 5 LE x 5 min while taking subjective STS 3x10  Supine SLR 2x15 each Supine ball squeeze 2x10 - 3" hold Supine clamshell 2x15 Black TB Supine march 2x20 Black TB Bridge 3x10 Seated knee ext 3x10 10# Seated hamstring curl 2x10 35# Lateral walk RTB 3 laps at counter Standing hip abd/ext 2x10 each RTB  [facility] Adult PT Treatment:                                                DATE: 03/16/2022 Therapeutic Exercise: NuStep lvl 5 LE x 3 min while taking subjective STS 3x10  LAQ 2x10 4# Supine SLR 2x15 each Supine ball squeeze 2x10 - 3" hold Supine clamshell 2x15 BTB Supine march 2x20 BTB Bridge 2x10 Standing hip abd/ext 2x10 each RTB  [facility] Adult PT Treatment:                                                DATE: 03/13/2022 Therapeutic Exercise: NuStep lvl 6 LE x 5 min while taking subjective STS 2x10  LAQ 2x10 4# Supine SLR 2x15 each Supine ball squeeze 2x10 - 3" hold Supine clamshell 2x15 BTB Supine march 2x20 BTB Bridge 2x10 Standing hip abd/ext 2x10 each 2#  [facility]  [facility]:                                                DATE: 03/06/2022 Therapeutic Exercise: NuStep lvl 5 UE/LE x 5 min while taking subjective STS 3x5  LAQ 2x10 3# Supine SLR 2x10 each Supine ball squeeze 2x10 - 3" hold Supine clamshell 2x15 BTB Supine march 2x10 GTB Bridge 2x10 Standing hip abd/ext 2x10 each  [facility] Adult PT Treatment:                                                DATE: 02/27/2022 Therapeutic Exercise: NuStep lvl 5 UE/LE x 5 min while taking subjective Supine ball squeeze 2x10 - 3" hold Supine clamshell 2x15 GTB Supine march 2x10 GTB Bridge 2x10 Supine SLR 2x10 each LAQ 2.5# 3x10  [facility] Adult PT Treatment:                                                DATE: 02/23/2022 Therapeutic Exercise: NuStep lvl 5 UE/LE x 5 min while taking subjective Supine clamshell 2x10 GTB Bridge x 10 Supine SLR 2x10 each Seated ball squeeze 2x10 - 5" hold  LTR x 10  [facility] Adult PT Treatment:                                                DATE: 02/21/2022 Therapeutic Exercise: 2MWT Clam AROM/ butterfly stretch Clam with green band x 10 Bridge x 10 March 10 x 2  LTR x 10   PATIENT EDUCATION:  Education details: HEP Person educated: Patient and Caregiver girl friend Education method: Explanation Education comprehension: verbalized understanding and needs further education   HOME EXERCISE PROGRAM: Access Code: [email or web address] URL: [email or web address] Date: 03/06/2022 Prepared by: Octavio Manns  Exercises - Supine Lower Trunk Rotation  - 1 x daily - 7 x weekly - 2 sets - 10 reps - Supine Bridge  - 1 x daily - 7 x weekly - 2 sets - 10 reps - Active Straight Leg Raise with Quad Set  - 1 x daily - 7 x weekly - 2 sets - 10 reps - Hooklying Clamshell with Resistance  - 1 x daily - 7 x weekly - 2 sets - 15 reps - blue theraband hold - Standing Hip Abduction with Counter Support  - 1 x daily - 7 x weekly - 2 sets - 10 reps - Standing Hip Extension with Counter Support   - 1 x daily - 7 x weekly - 2 sets - 10 reps   ASSESSMENT:   CLINICAL IMPRESSION: Pt was able to complete all prescribed exercises with no adverse effect or increase in pain. Therapy focused on improving LE strength to decrease pain and improve functional mobility. He has improved his 5xSTS and overall strength with therapy. Pt continues to benefit from skilled services, will progress as able per POC.   OBJECTIVE IMPAIRMENTS Abnormal gait, cardiopulmonary status limiting activity, decreased activity tolerance, decreased balance, decreased knowledge  of condition, difficulty walking, decreased ROM, decreased strength, postural dysfunction, obesity, and pain.    ACTIVITY LIMITATIONS carrying, lifting, bending, standing, squatting, stairs, and locomotion level   PARTICIPATION LIMITATIONS: meal prep, cleaning, laundry, shopping, and community activity   PERSONAL FACTORS Fitness, Past/current experiences, Time since onset of injury/illness/exacerbation, and 3+ comorbidities:    Saddle pulmonary embolii, Heart failure, atrial arrhythmia, CHF, Afib, Respiratory failure, CHOLECYSTECTOMY-2019, COLON SURGERY-Cancer 2013, INCISIONAL HERNIA REPAIR, DM, low back pain, R groin pain, R flank pain, obesityare also affecting patient's functional outcome.      GOALS:   SHORT TERM GOALS: Target date: 02/24/2022  Pt will be Ind in an initial HEP Baseline: Not initiated Goal status: MET   LONG TERM GOALS: Target date: 05/04/2022    Pt will be Ind in a final HEP to maintain achieved LOF Baseline: Not initiated Goal status: ONGOING   2.  Increase hip strength to 4/5 and knee strength to 5/5 for improved functional ability and safety Baseline: see flow sheets Goal status: ONGOING   3.  Improve 5xSTS by MCID of 5" and 2MWT by MCID of 28f as indication of improved functional mobility Baseline: 2 MWT 172 feet 02/21/22, 5 x STS TBA 03/30/2022: 15 seconds  Goal status: ONGOING   4.  Pt's FOTO score will  improved to the predicted value of 49% as indication of improved function Baseline: 34% Goal status: ONGOING   PLAN: PT FREQUENCY: 2x/week   PT DURATION: 6 weeks   PLANNED INTERVENTIONS: Therapeutic exercises, Therapeutic activity, Balance training, Gait training, Patient/Family education, Self Care, Joint mobilization, Cryotherapy, Moist heat, Taping, Manual therapy, and Re-evaluation   PLAN FOR NEXT SESSION: Review FOTO; initiate HEP; use of modalities, manual therapy; assess 5xSTS?  DWard ChattersPT  03/30/22 6:24 PM

## 2022-04-03 NOTE — Therapy (Signed)
OUTPATIENT PHYSICAL THERAPY TREATMENT NOTE    Patient Name: Raymond Allen MRN: [id number] DOB:02-03-1961, 61 y.o., male Today's Date: 04/04/2022  PCP: [place], Julie M, NP   REFERRING PROVIDER: Jolaine Artist, MD  END OF SESSION:   PT End of Session - 04/04/22 1620     Visit Number 11    Number of Visits 21    Date for PT Re-Evaluation 05/04/22    Authorization Type [employer] MEDICARE    Progress Note Due on Visit 10    PT Start Time 1620    PT Stop Time 1700    PT Time Calculation (min) 40 min    Activity Tolerance Patient tolerated treatment well    Behavior During Therapy [facility] for tasks assessed/performed              Past Medical History:  Diagnosis Date   Cancer ([place])    Colon cancer metastasized to liver ([place]) 01/05/2012   Colon carcinoma ([place])    colon ca dx 2001;   Diabetes mellitus without complication ([place])    Hypertension    Muscle weakness-general 01/05/2012   Past Surgical History:  Procedure Laterality Date   CHOLECYSTECTOMY     COLON SURGERY     sigmoid colectomy, appendectomy   INCISIONAL HERNIA REPAIR N/A 11/27/2017   Procedure: LAPAROSCOPIC ASSISTED INCISIONAL HERNIA;  Surgeon: Clovis Riley, MD;  [facility];  Service: General;  Laterality: N/A;   INSERTION OF MESH N/A 11/27/2017   Procedure: INSERTION OF MESH;  Surgeon: Clovis Riley, MD;  [facility];  Service: General;  Laterality: N/A;   LAPAROSCOPIC LYSIS OF ADHESIONS N/A 11/27/2017   Procedure: LAPAROSCOPIC LYSIS OF ADHESIONS;  Surgeon: Clovis Riley, MD;  [facility];  Service: General;  Laterality: N/A;   LIVER SURGERY     partial removal duke in 2003   PORT-A-CATH REMOVAL Right 07/16/2012   Procedure: REMOVAL PORT-A-CATH;  Surgeon: Haywood Lasso, MD;  Location: [place];  Service: General;  Laterality: Right;   RIGHT/LEFT HEART CATH AND CORONARY ANGIOGRAPHY N/A 04/01/2021   Procedure: RIGHT/LEFT HEART CATH AND CORONARY ANGIOGRAPHY;   Surgeon: Jolaine Artist, MD;  Location: [facility] [facility];  Service: Cardiovascular;  Laterality: N/A;   Patient Active Problem List   Diagnosis Date Noted   Chronic combined systolic and diastolic CHF (congestive heart failure) ([place]) 12/30/2021   Atrial fibrillation, chronic ([place]) 12/30/2021   Mixed hyperlipidemia 12/30/2021   Obesity (BMI 30-39.9) 12/30/2021   Pulmonary embolism ([place]) 12/29/2021   Physical deconditioning 76/73/4193   Acute systolic heart failure ([place]) 04/05/2021   Pulmonary edema 79/05/4095   Acute metabolic encephalopathy 35/32/9924   CKD (chronic kidney disease) stage 3, GFR 30-59 ml/min (HCC) 04/05/2021   Atrial arrhythmia 04/05/2021   Respiratory distress 03/29/2021   Heart failure, type unknown ([place])    Type 2 diabetes mellitus without complication ([place])    Incisional hernia 11/27/2017   Colon cancer metastasized to liver ([place]) 01/05/2012   FLANK PAIN, RIGHT 01/21/2008   TOBACCO ABUSE 12/26/2007   HYPERGLYCEMIA 12/26/2007   PERS HX NONCOMPLIANCE W/MED TX PRS HAZARDS HLTH 12/26/2007   CARCINOMA, COLON 12/17/2007   Essential hypertension 12/17/2007   Respiratory failure, acute ([place]) 12/17/2007    REFERRING DIAG: R53.81 (ICD-10-CM) - Physical deconditioning  THERAPY DIAG:  Other low back pain  Muscle weakness (generalized)  Other abnormalities of gait and mobility  Difficulty in walking, not elsewhere classified  Rationale for Evaluation and Treatment rehabilitation  PERTINENT HISTORY: Saddle pulmonary embolii, Heart failure, atrial arrhythmia, CHF, Afib, Respiratory failure, CHOLECYSTECTOMY-2019, COLON SURGERY-Cancer 2013, INCISIONAL HERNIA REPAIR, DM, low back pain, R groin pain, R flank pain, obesity  PRECAUTIONS: Other: Cardiopulmonary  SUBJECTIVE:                                                                                                                                                                                      SUBJECTIVE  STATEMENT:  Patient reports continued high level groin pain, stating nothing is helping it.   PAIN:  Are you having pain?  Yes: NPRS scale: 10/10 Pain location: R groin Pain description: grabbng Aggravating factors: walking Relieving factors: rest Low Back pain is 4/10 and better after injections   OBJECTIVE: (objective measures completed at initial evaluation unless otherwise dated)  PATIENT SURVEYS:  FOTO: Perceived function   34%, predicted   49%    COGNITION:           Overall cognitive status: Within functional limits for tasks assessed                          SENSATION: WFL   EDEMA:  None tested for the lower legs   POSTURE: flexed trunk    PALPATION: NT   LOWER EXTREMITY ROM:            Limited hip flexion due to pain Active ROM Right eval Left eval  Hip flexion      Hip extension      Hip abduction      Hip adduction      Hip internal rotation      Hip external rotation      Knee flexion      Knee extension      Ankle dorsiflexion      Ankle plantarflexion      Ankle inversion      Ankle eversion       (Blank rows = not tested)   LOWER EXTREMITY MMT:   MMT Right eval Left eval  Hip flexion 3 3  Hip extension 3 3  Hip abduction 3 3  Hip adduction 3+ 3+  Hip internal rotation      Hip external rotation 3 3  Knee flexion 4 4  Knee extension 4 4  Ankle dorsiflexion      Ankle plantarflexion      Ankle inversion      Ankle eversion       (Blank rows = not tested)   LOWER EXTREMITY SPECIAL TESTS:  NT   FUNCTIONAL TESTS:  5 times sit to stand: 15 secs 2 minute walk  test: 172 feet 02/21/22   GAIT: Distance walked: 227f pt estimates his walking tolerance to 3059fAssistive device utilized: None Level of assistance: Complete Independence Comments: Antalgic gait pattern over the R LE    [facility]                                                DATE: 04/04/2022 Therapeutic Exercise: NuStep lvl 6 LE x  5 min while taking subjective STS 2x10 - low table Bridge 2x15 Supine SLR 2x15 each Supine clamshell BTB 2x20 Seated knee ext 3x10 15# Seated hamstring curl 3x10 35# Leg press 55# 2x10 Lateral walk RTB 4 laps at counter Standing hip abd/ext 2x10 each RTB  [facility] Adult PT Treatment:                                                DATE: 03/30/2022 Therapeutic Exercise: NuStep lvl 6 LE x 5 min while taking subjective STS 2x10 - low table Bridge 2x15 Supine SLR 2x15 each Supine clamshell BTB 2x20 Seated knee ext 3x10 15# Seated hamstring curl 2x10 35# Lateral walk RTB 3 laps at counter Standing hip abd/ext 2x10 each RTB  [facility] Adult PT Treatment:                                                DATE: 03/23/2022 Therapeutic Exercise: NuStep lvl 6 LE x 5 min while taking subjective STS 3x10 Bridge 3x10  Supine SLR 2x15 each Seated pilates ring squeeze 2x10 - 3" hold Seated clamshell 2x20 Black TB Seated knee ext 3x10 10# Seated hamstring curl 2x10 35# Lateral walk RTB 3 laps at counter Standing hip abd/ext 2x10 each RTB    PATIENT EDUCATION:  Education details: HEP Person educated: Patient and Caregiver girl friend Education method: Explanation Education comprehension: verbalized understanding and needs further education   HOME EXERCISE PROGRAM: Access Code: [email or web address]: [email or web address] Date: 03/06/2022 Prepared by: DaOctavio MannsExercises - Supine Lower Trunk Rotation  - 1 x daily - 7 x weekly - 2 sets - 10 reps - Supine Bridge  - 1 x daily - 7 x weekly - 2 sets - 10 reps - Active Straight Leg Raise with Quad Set  - 1 x daily - 7 x weekly - 2 sets - 10 reps - Hooklying Clamshell with Resistance  - 1 x daily - 7 x weekly - 2 sets - 15 reps - blue theraband hold - Standing Hip Abduction with Counter Support  - 1 x daily - 7 x weekly - 2 sets - 10 reps - Standing Hip Extension with Counter Support  - 1 x daily - 7 x weekly - 2 sets - 10 reps    ASSESSMENT:   CLINICAL IMPRESSION: Patient presents to PT reporting continued high levels of pain, particularly in the R side groin area. Session today continued to focus on LE strengthening to improve functional independence. Patient was able to tolerate all prescribed exercises with no adverse effects. Patient continues to benefit from skilled PT services and should be progressed as able to  improve functional independence.   OBJECTIVE IMPAIRMENTS Abnormal gait, cardiopulmonary status limiting activity, decreased activity tolerance, decreased balance, decreased knowledge of condition, difficulty walking, decreased ROM, decreased strength, postural dysfunction, obesity, and pain.    ACTIVITY LIMITATIONS carrying, lifting, bending, standing, squatting, stairs, and locomotion level   PARTICIPATION LIMITATIONS: meal prep, cleaning, laundry, shopping, and community activity   PERSONAL FACTORS Fitness, Past/current experiences, Time since onset of injury/illness/exacerbation, and 3+ comorbidities:    Saddle pulmonary embolii, Heart failure, atrial arrhythmia, CHF, Afib, Respiratory failure, CHOLECYSTECTOMY-2019, COLON SURGERY-Cancer 2013, INCISIONAL HERNIA REPAIR, DM, low back pain, R groin pain, R flank pain, obesityare also affecting patient's functional outcome.      GOALS:   SHORT TERM GOALS: Target date: 02/24/2022  Pt will be Ind in an initial HEP Baseline: Not initiated Goal status: MET   LONG TERM GOALS: Target date: 05/04/2022    Pt will be Ind in a final HEP to maintain achieved LOF Baseline: Not initiated Goal status: ONGOING   2.  Increase hip strength to 4/5 and knee strength to 5/5 for improved functional ability and safety Baseline: see flow sheets Goal status: ONGOING   3.  Improve 5xSTS by MCID of 5" and 2MWT by MCID of 30f as indication of improved functional mobility Baseline: 2 MWT 172 feet 02/21/22, 5 x STS TBA 03/30/2022: 15 seconds  Goal status: ONGOING   4.   Pt's FOTO score will improved to the predicted value of 49% as indication of improved function Baseline: 34% Goal status: ONGOING   PLAN: PT FREQUENCY: 2x/week   PT DURATION: 6 weeks   PLANNED INTERVENTIONS: Therapeutic exercises, Therapeutic activity, Balance training, Gait training, Patient/Family education, Self Care, Joint mobilization, Cryotherapy, Moist heat, Taping, Manual therapy, and Re-evaluation   PLAN FOR NEXT SESSION: Review FOTO; initiate HEP; use of modalities, manual therapy  SMargarette CanadaPTA  04/04/22 4:21 PM

## 2022-04-04 ENCOUNTER — Ambulatory Visit: Payer: Medicare Other

## 2022-04-04 DIAGNOSIS — R2689 Other abnormalities of gait and mobility: Secondary | ICD-10-CM

## 2022-04-04 DIAGNOSIS — R262 Difficulty in walking, not elsewhere classified: Secondary | ICD-10-CM

## 2022-04-04 DIAGNOSIS — M6281 Muscle weakness (generalized): Secondary | ICD-10-CM

## 2022-04-04 DIAGNOSIS — M5459 Other low back pain: Secondary | ICD-10-CM

## 2022-04-06 ENCOUNTER — Ambulatory Visit: Payer: Medicare Other

## 2022-04-06 DIAGNOSIS — R2689 Other abnormalities of gait and mobility: Secondary | ICD-10-CM

## 2022-04-06 DIAGNOSIS — M6281 Muscle weakness (generalized): Secondary | ICD-10-CM

## 2022-04-06 DIAGNOSIS — M5459 Other low back pain: Secondary | ICD-10-CM

## 2022-04-06 NOTE — Therapy (Signed)
OUTPATIENT PHYSICAL THERAPY TREATMENT NOTE    Patient Name: Raymond Allen MRN: [id number] DOB:1960-07-20, 61 y.o., male Today's Date: 04/06/2022  PCP: [place], Julie M, NP   REFERRING PROVIDER: Jolaine Artist, MD  END OF SESSION:   PT End of Session - 04/06/22 1709     Visit Number 12    Number of Visits 21    Date for PT Re-Evaluation 05/04/22    Authorization Type [employer] MEDICARE    Progress Note Due on Visit 10    PT Start Time 1709    PT Stop Time 1025    PT Time Calculation (min) 36 min    Activity Tolerance Patient tolerated treatment well    Behavior During Therapy [facility] for tasks assessed/performed               Past Medical History:  Diagnosis Date   Cancer ([place])    Colon cancer metastasized to liver ([place]) 01/05/2012   Colon carcinoma ([place])    colon ca dx 2001;   Diabetes mellitus without complication ([place])    Hypertension    Muscle weakness-general 01/05/2012   Past Surgical History:  Procedure Laterality Date   CHOLECYSTECTOMY     COLON SURGERY     sigmoid colectomy, appendectomy   INCISIONAL HERNIA REPAIR N/A 11/27/2017   Procedure: LAPAROSCOPIC ASSISTED INCISIONAL HERNIA;  Surgeon: Clovis Riley, MD;  Location: [facility];  Service: General;  Laterality: N/A;   INSERTION OF MESH N/A 11/27/2017   Procedure: INSERTION OF MESH;  Surgeon: Clovis Riley, MD;  Location: [facility];  Service: General;  Laterality: N/A;   LAPAROSCOPIC LYSIS OF ADHESIONS N/A 11/27/2017   Procedure: LAPAROSCOPIC LYSIS OF ADHESIONS;  Surgeon: Clovis Riley, MD;  [facility]: [facility];  Service: General;  Laterality: N/A;   LIVER SURGERY     partial removal duke in 2003   PORT-A-CATH REMOVAL Right 07/16/2012   Procedure: REMOVAL PORT-A-CATH;  Surgeon: Haywood Lasso, MD;  Location: [place];  Service: General;  Laterality: Right;   RIGHT/LEFT HEART CATH AND CORONARY ANGIOGRAPHY N/A 04/01/2021   Procedure: RIGHT/LEFT HEART CATH AND CORONARY ANGIOGRAPHY;   Surgeon: Jolaine Artist, MD;  Location: [place] [facility];  Service: Cardiovascular;  Laterality: N/A;   Patient Active Problem List   Diagnosis Date Noted   Chronic combined systolic and diastolic CHF (congestive heart failure) ([place]) 12/30/2021   Atrial fibrillation, chronic ([place]) 12/30/2021   Mixed hyperlipidemia 12/30/2021   Obesity (BMI 30-39.9) 12/30/2021   Pulmonary embolism ([place]) 12/29/2021   Physical deconditioning 85/27/7824   Acute systolic heart failure ([place]) 04/05/2021   Pulmonary edema 23/53/6144   Acute metabolic encephalopathy 31/54/0086   CKD (chronic kidney disease) stage 3, GFR 30-59 ml/min (HCC) 04/05/2021   Atrial arrhythmia 04/05/2021   Respiratory distress 03/29/2021   Heart failure, type unknown ([place])    Type 2 diabetes mellitus without complication ([place])    Incisional hernia 11/27/2017   Colon cancer metastasized to liver ([place]) 01/05/2012   FLANK PAIN, RIGHT 01/21/2008   TOBACCO ABUSE 12/26/2007   HYPERGLYCEMIA 12/26/2007   PERS HX NONCOMPLIANCE W/MED TX PRS HAZARDS HLTH 12/26/2007   CARCINOMA, COLON 12/17/2007   Essential hypertension 12/17/2007   Respiratory failure, acute ([place]) 12/17/2007    REFERRING DIAG: R53.81 (ICD-10-CM) - Physical deconditioning  THERAPY DIAG:  Other low back pain  Muscle weakness (generalized)  Other abnormalities of gait and mobility  Rationale for Evaluation and Treatment rehabilitation  PERTINENT HISTORY: Saddle pulmonary embolii, Heart  failure, atrial arrhythmia, CHF, Afib, Respiratory failure, CHOLECYSTECTOMY-2019, COLON SURGERY-Cancer 2013, INCISIONAL HERNIA REPAIR, DM, low back pain, R groin pain, R flank pain, obesity  PRECAUTIONS: Other: Cardiopulmonary  SUBJECTIVE:                                                                                                                                                                                      SUBJECTIVE STATEMENT:  Pt presents to PT with reports of  increased pain and swelling today. Notes that he feels he did too much last time, had some muscle soreness and swelling. He is ready to begin PT at this time.    PAIN:  Are you having pain?  Yes: NPRS scale: 10/10 Pain location: R groin Pain description: grabbng Aggravating factors: walking Relieving factors: rest Low Back pain is 4/10 and better after injections   OBJECTIVE: (objective measures completed at initial evaluation unless otherwise dated)  PATIENT SURVEYS:  FOTO: Perceived function   34%, predicted   49%    COGNITION:           Overall cognitive status: Within functional limits for tasks assessed                          SENSATION: WFL   EDEMA:  None tested for the lower legs   POSTURE: flexed trunk    PALPATION: NT   LOWER EXTREMITY ROM:            Limited hip flexion due to pain Active ROM Right eval Left eval  Hip flexion      Hip extension      Hip abduction      Hip adduction      Hip internal rotation      Hip external rotation      Knee flexion      Knee extension      Ankle dorsiflexion      Ankle plantarflexion      Ankle inversion      Ankle eversion       (Blank rows = not tested)   LOWER EXTREMITY MMT:   MMT Right eval Left eval  Hip flexion 3 3  Hip extension 3 3  Hip abduction 3 3  Hip adduction 3+ 3+  Hip internal rotation      Hip external rotation 3 3  Knee flexion 4 4  Knee extension 4 4  Ankle dorsiflexion      Ankle plantarflexion      Ankle inversion      Ankle eversion       (Blank rows = not tested)   LOWER  EXTREMITY SPECIAL TESTS:  NT   FUNCTIONAL TESTS:  5 times sit to stand: 15 secs 2 minute walk test: 172 feet 02/21/22   GAIT: Distance walked: 237f pt estimates his walking tolerance to 3024fAssistive device utilized: None Level of assistance: Complete Independence Comments: Antalgic gait pattern over the R LE    [facility]                                                 DATE: 04/06/2022 Therapeutic Exercise: NuStep lvl 6 LE x 5 min while taking subjective STS 2x10 - low table Supine SLR 2x15 each Bridge 2x15 Supine clamshell BTB 2x20 Supine march 2x20 BTB LAQ 2x10 5# Seated hamstring curl 2x10 BTB  [facility] Adult PT Treatment:                                                DATE: 04/04/2022 Therapeutic Exercise: NuStep lvl 6 LE x 5 min while taking subjective STS 2x10 - low table Bridge 2x15 Supine SLR 2x15 each Supine clamshell BTB 2x20 Seated knee ext 3x10 15# Seated hamstring curl 3x10 35# Leg press 55# 2x10 Lateral walk RTB 4 laps at counter Standing hip abd/ext 2x10 each RTB  [facility] Adult PT Treatment:                                                DATE: 03/30/2022 Therapeutic Exercise: NuStep lvl 6 LE x 5 min while taking subjective STS 2x10 - low table Bridge 2x15 Supine SLR 2x15 each Supine clamshell BTB 2x20 Seated knee ext 3x10 15# Seated hamstring curl 2x10 35# Lateral walk RTB 3 laps at counter Standing hip abd/ext 2x10 each RTB  [facility] Adult PT Treatment:                                                DATE: 03/23/2022 Therapeutic Exercise: NuStep lvl 6 LE x 5 min while taking subjective STS 3x10 Bridge 3x10  Supine SLR 2x15 each Seated pilates ring squeeze 2x10 - 3" hold Seated clamshell 2x20 Black TB Seated knee ext 3x10 10# Seated hamstring curl 2x10 35# Lateral walk RTB 3 laps at counter Standing hip abd/ext 2x10 each RTB    PATIENT EDUCATION:  Education details: HEP Person educated: Patient and Caregiver girl friend Education method: Explanation Education comprehension: verbalized understanding and needs further education   HOME EXERCISE PROGRAM: Access Code: [email or web address]: [email or web address] Date: 03/06/2022 Prepared by: DaOctavio MannsExercises - Supine Lower Trunk Rotation  - 1 x daily - 7 x weekly - 2 sets - 10 reps - Supine Bridge  - 1 x daily - 7 x weekly - 2 sets - 10 reps - Active  Straight Leg Raise with Quad Set  - 1 x daily - 7 x weekly - 2 sets - 10 reps - Hooklying Clamshell with Resistance  - 1 x daily - 7 x weekly - 2 sets -  15 reps - blue theraband hold - Standing Hip Abduction with Counter Support  - 1 x daily - 7 x weekly - 2 sets - 10 reps - Standing Hip Extension with Counter Support  - 1 x daily - 7 x weekly - 2 sets - 10 reps   ASSESSMENT:   CLINICAL IMPRESSION: Pt was able to complete all prescribed exercises with no adverse effect or increase in pain. Therapy focused on improving LE strength to decrease pain and improve functional mobility. Pt continues to benefit from skilled services, will progress as able per POC.   OBJECTIVE IMPAIRMENTS Abnormal gait, cardiopulmonary status limiting activity, decreased activity tolerance, decreased balance, decreased knowledge of condition, difficulty walking, decreased ROM, decreased strength, postural dysfunction, obesity, and pain.    ACTIVITY LIMITATIONS carrying, lifting, bending, standing, squatting, stairs, and locomotion level   PARTICIPATION LIMITATIONS: meal prep, cleaning, laundry, shopping, and community activity   PERSONAL FACTORS Fitness, Past/current experiences, Time since onset of injury/illness/exacerbation, and 3+ comorbidities:    Saddle pulmonary embolii, Heart failure, atrial arrhythmia, CHF, Afib, Respiratory failure, CHOLECYSTECTOMY-2019, COLON SURGERY-Cancer 2013, INCISIONAL HERNIA REPAIR, DM, low back pain, R groin pain, R flank pain, obesityare also affecting patient's functional outcome.      GOALS:   SHORT TERM GOALS: Target date: 02/24/2022  Pt will be Ind in an initial HEP Baseline: Not initiated Goal status: MET   LONG TERM GOALS: Target date: 05/04/2022    Pt will be Ind in a final HEP to maintain achieved LOF Baseline: Not initiated Goal status: ONGOING   2.  Increase hip strength to 4/5 and knee strength to 5/5 for improved functional ability and safety Baseline: see flow  sheets Goal status: ONGOING   3.  Improve 5xSTS by MCID of 5" and 2MWT by MCID of 56f as indication of improved functional mobility Baseline: 2 MWT 172 feet 02/21/22, 5 x STS TBA 03/30/2022: 15 seconds  Goal status: ONGOING   4.  Pt's FOTO score will improved to the predicted value of 49% as indication of improved function Baseline: 34% Goal status: ONGOING   PLAN: PT FREQUENCY: 2x/week   PT DURATION: 6 weeks   PLANNED INTERVENTIONS: Therapeutic exercises, Therapeutic activity, Balance training, Gait training, Patient/Family education, Self Care, Joint mobilization, Cryotherapy, Moist heat, Taping, Manual therapy, and Re-evaluation   PLAN FOR NEXT SESSION: Review FOTO; initiate HEP; use of modalities, manual therapy  DWard ChattersPT  04/06/22 5:55 PM

## 2022-04-11 ENCOUNTER — Ambulatory Visit: Payer: Medicare Other

## 2022-04-11 DIAGNOSIS — M5459 Other low back pain: Secondary | ICD-10-CM

## 2022-04-11 DIAGNOSIS — R2689 Other abnormalities of gait and mobility: Secondary | ICD-10-CM

## 2022-04-11 DIAGNOSIS — R262 Difficulty in walking, not elsewhere classified: Secondary | ICD-10-CM

## 2022-04-11 DIAGNOSIS — M6281 Muscle weakness (generalized): Secondary | ICD-10-CM

## 2022-04-11 NOTE — Therapy (Signed)
OUTPATIENT PHYSICAL THERAPY TREATMENT NOTE    Patient Name: Raymond Allen MRN: [id number] DOB:[birth year], 61 y.o., male Today's Date: 04/11/2022  PCP: [place], Julie M, NP   REFERRING PROVIDER: Jolaine Artist, MD  END OF SESSION:   PT End of Session - 04/11/22 1625     Visit Number 13    Number of Visits 21    Date for PT Re-Evaluation 05/04/22    Authorization Type [employer] MEDICARE    Progress Note Due on Visit 10    PT Start Time 1623    PT Stop Time 8469    PT Time Calculation (min) 35 min    Activity Tolerance Patient tolerated treatment well    Behavior During Therapy [facility] for tasks assessed/performed                Past Medical History:  Diagnosis Date   Cancer ([place])    Colon cancer metastasized to liver ([place]) 01/05/2012   Colon carcinoma ([place])    colon ca dx 2001;   Diabetes mellitus without complication ([place])    Hypertension    Muscle weakness-general 01/05/2012   Past Surgical History:  Procedure Laterality Date   CHOLECYSTECTOMY     COLON SURGERY     sigmoid colectomy, appendectomy   INCISIONAL HERNIA REPAIR N/A 11/27/2017   Procedure: LAPAROSCOPIC ASSISTED INCISIONAL HERNIA;  Surgeon: Clovis Riley, MD;  [facility];  Service: General;  Laterality: N/A;   INSERTION OF MESH N/A 11/27/2017   Procedure: INSERTION OF MESH;  Surgeon: Clovis Riley, MD;  [facility];  Service: General;  Laterality: N/A;   LAPAROSCOPIC LYSIS OF ADHESIONS N/A 11/27/2017   Procedure: LAPAROSCOPIC LYSIS OF ADHESIONS;  Surgeon: Clovis Riley, MD;  [facility];  Service: General;  Laterality: N/A;   LIVER SURGERY     partial removal duke in 2003   PORT-A-CATH REMOVAL Right 07/16/2012   Procedure: REMOVAL PORT-A-CATH;  Surgeon: Haywood Lasso, MD;  Location: [place];  Service: General;  Laterality: Right;   RIGHT/LEFT HEART CATH AND CORONARY ANGIOGRAPHY N/A 04/01/2021   Procedure: RIGHT/LEFT HEART CATH AND CORONARY ANGIOGRAPHY;   Surgeon: Jolaine Artist, MD;  Location: [place] [facility];  Service: Cardiovascular;  Laterality: N/A;   Patient Active Problem List   Diagnosis Date Noted   Chronic combined systolic and diastolic CHF (congestive heart failure) ([place]) 12/30/2021   Atrial fibrillation, chronic ([place]) 12/30/2021   Mixed hyperlipidemia 12/30/2021   Obesity (BMI 30-39.9) 12/30/2021   Pulmonary embolism ([place]) 12/29/2021   Physical deconditioning 62/95/2841   Acute systolic heart failure ([place]) 04/05/2021   Pulmonary edema 32/44/0102   Acute metabolic encephalopathy 72/53/6644   CKD (chronic kidney disease) stage 3, GFR 30-59 ml/min (HCC) 04/05/2021   Atrial arrhythmia 04/05/2021   Respiratory distress 03/29/2021   Heart failure, type unknown ([place])    Type 2 diabetes mellitus without complication ([place])    Incisional hernia 11/27/2017   Colon cancer metastasized to liver ([place]) 01/05/2012   FLANK PAIN, RIGHT 01/21/2008   TOBACCO ABUSE 12/26/2007   HYPERGLYCEMIA 12/26/2007   PERS HX NONCOMPLIANCE W/MED TX PRS HAZARDS HLTH 12/26/2007   CARCINOMA, COLON 12/17/2007   Essential hypertension 12/17/2007   Respiratory failure, acute ([place]) 12/17/2007    REFERRING DIAG: R53.81 (ICD-10-CM) - Physical deconditioning  THERAPY DIAG:  Other low back pain  Muscle weakness (generalized)  Other abnormalities of gait and mobility  Difficulty in walking, not elsewhere classified  Rationale for Evaluation and Treatment  rehabilitation  PERTINENT HISTORY: Saddle pulmonary embolii, Heart failure, atrial arrhythmia, CHF, Afib, Respiratory failure, CHOLECYSTECTOMY-2019, COLON SURGERY-Cancer 2013, INCISIONAL HERNIA REPAIR, DM, low back pain, R groin pain, R flank pain, obesity  PRECAUTIONS: Other: Cardiopulmonary  SUBJECTIVE:                                                                                                                                                                                      SUBJECTIVE  STATEMENT:  Pt presents to PT with reports of continued groin pain. Notes that he feels he did too much last time, had some muscle soreness and swelling. He is ready to begin PT at this time.     PAIN:  Are you having pain?  Yes: NPRS scale: 10/10 Pain location: R groin Pain description: grabbng Aggravating factors: walking Relieving factors: rest Low Back pain is 4/10 and better after injections   OBJECTIVE: (objective measures completed at initial evaluation unless otherwise dated)  PATIENT SURVEYS:  FOTO: Perceived function   34%, predicted   49%    COGNITION:           Overall cognitive status: Within functional limits for tasks assessed                          SENSATION: WFL   EDEMA:  None tested for the lower legs   POSTURE: flexed trunk    PALPATION: NT   LOWER EXTREMITY ROM:            Limited hip flexion due to pain Active ROM Right eval Left eval  Hip flexion      Hip extension      Hip abduction      Hip adduction      Hip internal rotation      Hip external rotation      Knee flexion      Knee extension      Ankle dorsiflexion      Ankle plantarflexion      Ankle inversion      Ankle eversion       (Blank rows = not tested)   LOWER EXTREMITY MMT:   MMT Right eval Left eval  Hip flexion 3 3  Hip extension 3 3  Hip abduction 3 3  Hip adduction 3+ 3+  Hip internal rotation      Hip external rotation 3 3  Knee flexion 4 4  Knee extension 4 4  Ankle dorsiflexion      Ankle plantarflexion      Ankle inversion      Ankle eversion       (Blank  rows = not tested)   LOWER EXTREMITY SPECIAL TESTS:  NT   FUNCTIONAL TESTS:  5 times sit to stand: 15 secs 2 minute walk test: 172 feet 02/21/22   GAIT: Distance walked: 219f pt estimates his walking tolerance to 3052fAssistive device utilized: None Level of assistance: Complete Independence Comments: Antalgic gait pattern over the R LE    [facility]REAT[facility]                                                 DATE: 04/11/2022 Therapeutic Exercise: NuStep lvl 5 LE x 5 min while taking subjective STS 2x10 - low table Supine SLR 2x15 each Bridge 2x15 Supine clamshell Black TB 2x15 LAQ 2x10 5# Seated hamstring curl 2x10 BTB Lateral walk RTB x 3 laps Standing hip abd/ext 2x10 RTB  [facility] Adult PT Treatment:                                                DATE: 04/06/2022 Therapeutic Exercise: NuStep lvl 6 LE x 5 min while taking subjective STS 2x10 - low table Supine SLR 2x15 each Bridge 2x15 Supine clamshell BTB 2x20 Supine march 2x20 BTB LAQ 2x10 5# Seated hamstring curl 2x10 BTB  [facility] Adult PT Treatment:                                                DATE: 04/04/2022 Therapeutic Exercise: NuStep lvl 6 LE x 5 min while taking subjective STS 2x10 - low table Bridge 2x15 Supine SLR 2x15 each Supine clamshell BTB 2x20 Seated knee ext 3x10 15# Seated hamstring curl 3x10 35# Leg press 55# 2x10 Lateral walk RTB 4 laps at counter Standing hip abd/ext 2x10 each RTB  [facility] Adult PT Treatment:                                                DATE: 03/30/2022 Therapeutic Exercise: NuStep lvl 6 LE x 5 min while taking subjective STS 2x10 - low table Bridge 2x15 Supine SLR 2x15 each Supine clamshell BTB 2x20 Seated knee ext 3x10 15# Seated hamstring curl 2x10 35# Lateral walk RTB 3 laps at counter Standing hip abd/ext 2x10 each RTB  [facility] Adult PT Treatment:                                                DATE: 03/23/2022 Therapeutic Exercise: NuStep lvl 6 LE x 5 min while taking subjective STS 3x10 Bridge 3x10  Supine SLR 2x15 each Seated pilates ring squeeze 2x10 - 3" hold Seated clamshell 2x20 Black TB Seated knee ext 3x10 10# Seated hamstring curl 2x10 35# Lateral walk RTB 3 laps at counter Standing hip abd/ext 2x10 each RTB    PATIENT EDUCATION:  Education details: HEP Person educated: Patient and Caregiver girl friend EdScientist, physiological  method: Explanation Education comprehension: verbalized understanding and needs further education   HOME EXERCISE PROGRAM: Access Code: [email or web address] URL: [email or web address] Date: 03/06/2022 Prepared by: Octavio Manns  Exercises - Supine Lower Trunk Rotation  - 1 x daily - 7 x weekly - 2 sets - 10 reps - Supine Bridge  - 1 x daily - 7 x weekly - 2 sets - 10 reps - Active Straight Leg Raise with Quad Set  - 1 x daily - 7 x weekly - 2 sets - 10 reps - Hooklying Clamshell with Resistance  - 1 x daily - 7 x weekly - 2 sets - 15 reps - blue theraband hold - Standing Hip Abduction with Counter Support  - 1 x daily - 7 x weekly - 2 sets - 10 reps - Standing Hip Extension with Counter Support  - 1 x daily - 7 x weekly - 2 sets - 10 reps   ASSESSMENT:   CLINICAL IMPRESSION: Pt was able to complete all prescribed exercises with no adverse effect or increase in pain. Therapy focused on improving LE strength to decrease pain and improve functional mobility. Pt continues to benefit from skilled services, will progress as able per POC.    OBJECTIVE IMPAIRMENTS Abnormal gait, cardiopulmonary status limiting activity, decreased activity tolerance, decreased balance, decreased knowledge of condition, difficulty walking, decreased ROM, decreased strength, postural dysfunction, obesity, and pain.    ACTIVITY LIMITATIONS carrying, lifting, bending, standing, squatting, stairs, and locomotion level   PARTICIPATION LIMITATIONS: meal prep, cleaning, laundry, shopping, and community activity   PERSONAL FACTORS Fitness, Past/current experiences, Time since onset of injury/illness/exacerbation, and 3+ comorbidities:    Saddle pulmonary embolii, Heart failure, atrial arrhythmia, CHF, Afib, Respiratory failure, CHOLECYSTECTOMY-2019, COLON SURGERY-Cancer 2013, INCISIONAL HERNIA REPAIR, DM, low back pain, R groin pain, R flank pain, obesityare also affecting patient's functional outcome.      GOALS:    SHORT TERM GOALS: Target date: 02/24/2022  Pt will be Ind in an initial HEP Baseline: Not initiated Goal status: MET   LONG TERM GOALS: Target date: 05/04/2022    Pt will be Ind in a final HEP to maintain achieved LOF Baseline: Not initiated Goal status: ONGOING   2.  Increase hip strength to 4/5 and knee strength to 5/5 for improved functional ability and safety Baseline: see flow sheets Goal status: ONGOING   3.  Improve 5xSTS by MCID of 5" and 2MWT by MCID of 72f as indication of improved functional mobility Baseline: 2 MWT 172 feet 02/21/22, 5 x STS TBA 03/30/2022: 15 seconds  Goal status: ONGOING   4.  Pt's FOTO score will improved to the predicted value of 49% as indication of improved function Baseline: 34% Goal status: ONGOING   PLAN: PT FREQUENCY: 2x/week   PT DURATION: 6 weeks   PLANNED INTERVENTIONS: Therapeutic exercises, Therapeutic activity, Balance training, Gait training, Patient/Family education, Self Care, Joint mobilization, Cryotherapy, Moist heat, Taping, Manual therapy, and Re-evaluation   PLAN FOR NEXT SESSION: Review FOTO; initiate HEP; use of modalities, manual therapy  DWard ChattersPT  04/11/22 5:01 PM

## 2022-04-12 ENCOUNTER — Ambulatory Visit: Payer: Medicare Other

## 2022-04-12 DIAGNOSIS — M5459 Other low back pain: Secondary | ICD-10-CM | POA: Diagnosis not present

## 2022-04-12 DIAGNOSIS — R2689 Other abnormalities of gait and mobility: Secondary | ICD-10-CM

## 2022-04-12 DIAGNOSIS — R262 Difficulty in walking, not elsewhere classified: Secondary | ICD-10-CM

## 2022-04-12 DIAGNOSIS — M6281 Muscle weakness (generalized): Secondary | ICD-10-CM

## 2022-04-12 NOTE — Therapy (Signed)
OUTPATIENT PHYSICAL THERAPY TREATMENT NOTE    Patient Name: Raymond Allen MRN: [id number] DOB:[birth year], 61 y.o., male Today's Date: 04/12/2022  PCP: Martinique, Julie M, NP   REFERRING PROVIDER: Jolaine Artist, MD  END OF SESSION:   PT End of Session - 04/12/22 1618     Visit Number 14    Number of Visits 21    Date for PT Re-Evaluation 05/04/22    Authorization Type [employer] MEDICARE    Progress Note Due on Visit 10    PT Start Time 1618    PT Stop Time 2841    PT Time Calculation (min) 40 min    Activity Tolerance Patient tolerated treatment well    Behavior During Therapy [facility] for tasks assessed/performed              Past Medical History:  Diagnosis Date   Cancer ([place])    Colon cancer metastasized to liver ([place]) 01/05/2012   Colon carcinoma ([place])    colon ca dx 2001;   Diabetes mellitus without complication ([place])    Hypertension    Muscle weakness-general 01/05/2012   Past Surgical History:  Procedure Laterality Date   CHOLECYSTECTOMY     COLON SURGERY     sigmoid colectomy, appendectomy   INCISIONAL HERNIA REPAIR N/A 11/27/2017   Procedure: LAPAROSCOPIC ASSISTED INCISIONAL HERNIA;  Surgeon: Clovis Riley, MD;  [facility];  Service: General;  Laterality: N/A;   INSERTION OF MESH N/A 11/27/2017   Procedure: INSERTION OF MESH;  Surgeon: Clovis Riley, MD;  [facility];  Service: General;  Laterality: N/A;   LAPAROSCOPIC LYSIS OF ADHESIONS N/A 11/27/2017   Procedure: LAPAROSCOPIC LYSIS OF ADHESIONS;  Surgeon: Clovis Riley, MD;  [facility];  Service: General;  Laterality: N/A;   LIVER SURGERY     partial removal duke in 2003   PORT-A-CATH REMOVAL Right 07/16/2012   Procedure: REMOVAL PORT-A-CATH;  Surgeon: Haywood Lasso, MD;  Location: [place];  Service: General;  Laterality: Right;   RIGHT/LEFT HEART CATH AND CORONARY ANGIOGRAPHY N/A 04/01/2021   Procedure: RIGHT/LEFT HEART CATH AND CORONARY ANGIOGRAPHY;   Surgeon: Jolaine Artist, MD;  Location: [place] [facility];  Service: Cardiovascular;  Laterality: N/A;   Patient Active Problem List   Diagnosis Date Noted   Chronic combined systolic and diastolic CHF (congestive heart failure) ([place]) 12/30/2021   Atrial fibrillation, chronic ([place]) 12/30/2021   Mixed hyperlipidemia 12/30/2021   Obesity (BMI 30-39.9) 12/30/2021   Pulmonary embolism ([place]) 12/29/2021   Physical deconditioning 32/44/0102   Acute systolic heart failure ([place]) 04/05/2021   Pulmonary edema 72/53/6644   Acute metabolic encephalopathy 03/47/4259   CKD (chronic kidney disease) stage 3, GFR 30-59 ml/min (HCC) 04/05/2021   Atrial arrhythmia 04/05/2021   Respiratory distress 03/29/2021   Heart failure, type unknown ([place])    Type 2 diabetes mellitus without complication ([place])    Incisional hernia 11/27/2017   Colon cancer metastasized to liver ([place]) 01/05/2012   FLANK PAIN, RIGHT 01/21/2008   TOBACCO ABUSE 12/26/2007   HYPERGLYCEMIA 12/26/2007   PERS HX NONCOMPLIANCE W/MED TX PRS HAZARDS HLTH 12/26/2007   CARCINOMA, COLON 12/17/2007   Essential hypertension 12/17/2007   Respiratory failure, acute ([place]) 12/17/2007    REFERRING DIAG: R53.81 (ICD-10-CM) - Physical deconditioning  THERAPY DIAG:  Other low back pain  Muscle weakness (generalized)  Other abnormalities of gait and mobility  Difficulty in walking, not elsewhere classified  Rationale for Evaluation and Treatment rehabilitation  PERTINENT HISTORY: Saddle pulmonary embolii, Heart failure, atrial arrhythmia, CHF, Afib, Respiratory failure, CHOLECYSTECTOMY-2019, COLON SURGERY-Cancer 2013, INCISIONAL HERNIA REPAIR, DM, low back pain, R groin pain, R flank pain, obesity  PRECAUTIONS: Other: Cardiopulmonary  SUBJECTIVE:                                                                                                                                                                                      SUBJECTIVE  STATEMENT:  Patient reports he feels a bit dizzy and lightheaded today and that his pain continues to be very high.    PAIN:  Are you having pain?  Yes: NPRS scale: 10/10 Pain location: R groin Pain description: grabbng Aggravating factors: walking Relieving factors: rest Low Back pain is 4/10 and better after injections   OBJECTIVE: (objective measures completed at initial evaluation unless otherwise dated)  PATIENT SURVEYS:  FOTO: Perceived function   34%, predicted   49%    COGNITION:           Overall cognitive status: Within functional limits for tasks assessed                          SENSATION: WFL   EDEMA:  None tested for the lower legs   POSTURE: flexed trunk    PALPATION: NT   LOWER EXTREMITY ROM:            Limited hip flexion due to pain Active ROM Right eval Left eval  Hip flexion      Hip extension      Hip abduction      Hip adduction      Hip internal rotation      Hip external rotation      Knee flexion      Knee extension      Ankle dorsiflexion      Ankle plantarflexion      Ankle inversion      Ankle eversion       (Blank rows = not tested)   LOWER EXTREMITY MMT:   MMT Right eval Left eval  Hip flexion 3 3  Hip extension 3 3  Hip abduction 3 3  Hip adduction 3+ 3+  Hip internal rotation      Hip external rotation 3 3  Knee flexion 4 4  Knee extension 4 4  Ankle dorsiflexion      Ankle plantarflexion      Ankle inversion      Ankle eversion       (Blank rows = not tested)   LOWER EXTREMITY SPECIAL TESTS:  NT   FUNCTIONAL TESTS:  5 times  sit to stand: 15 secs 2 minute walk test: 172 feet 02/21/22   GAIT: Distance walked: 24f pt estimates his walking tolerance to 3015fAssistive device utilized: None Level of assistance: Complete Independence Comments: Antalgic gait pattern over the R LE    [facility]                                                DATE: 04/12/2022 Therapeutic  Exercise: NuStep lvl 5 x 5 min while taking subjective STS x10 - low table Supine SLR 2x15 each Bridge 2x15 Sidelying clamshell RTB 2x15 BIL LAQ 2x10 5# Lateral walk RTB x 3 laps Standing hip abd/ext 2x10 RTB Seated knee ext 2x10 15# Seated hamstring curl 2x10 35#  [facility] Adult PT Treatment:                                                DATE: 04/11/2022 Therapeutic Exercise: NuStep lvl 5 LE x 5 min while taking subjective STS 2x10 - low table Supine SLR 2x15 each Bridge 2x15 Supine clamshell Black TB 2x15 LAQ 2x10 5# Seated hamstring curl 2x10 BTB Lateral walk RTB x 3 laps Standing hip abd/ext 2x10 RTB  [facility] Adult PT Treatment:                                                DATE: 04/06/2022 Therapeutic Exercise: NuStep lvl 6 LE x 5 min while taking subjective STS 2x10 - low table Supine SLR 2x15 each Bridge 2x15 Supine clamshell BTB 2x20 Supine march 2x20 BTB LAQ 2x10 5# Seated hamstring curl 2x10 BTB     PATIENT EDUCATION:  Education details: HEP Person educated: Patient and Caregiver girl friend Education method: Explanation Education comprehension: verbalized understanding and needs further education   HOME EXERCISE PROGRAM: Access Code: [email or web address]: [email or web address] Date: 03/06/2022 Prepared by: DaOctavio MannsExercises - Supine Lower Trunk Rotation  - 1 x daily - 7 x weekly - 2 sets - 10 reps - Supine Bridge  - 1 x daily - 7 x weekly - 2 sets - 10 reps - Active Straight Leg Raise with Quad Set  - 1 x daily - 7 x weekly - 2 sets - 10 reps - Hooklying Clamshell with Resistance  - 1 x daily - 7 x weekly - 2 sets - 15 reps - blue theraband hold - Standing Hip Abduction with Counter Support  - 1 x daily - 7 x weekly - 2 sets - 10 reps - Standing Hip Extension with Counter Support  - 1 x daily - 7 x weekly - 2 sets - 10 reps   ASSESSMENT:   CLINICAL IMPRESSION: Patient presents to PT reporting continued high levels of pain in his back Rt  groin area. Session today continued to focus on proximal hip and LE strengthening. Patient continues to benefit from skilled PT services and should be progressed as able to improve functional independence.    OBJECTIVE IMPAIRMENTS Abnormal gait, cardiopulmonary status limiting activity, decreased activity tolerance, decreased balance, decreased knowledge of condition, difficulty walking, decreased ROM, decreased strength,  postural dysfunction, obesity, and pain.    ACTIVITY LIMITATIONS carrying, lifting, bending, standing, squatting, stairs, and locomotion level   PARTICIPATION LIMITATIONS: meal prep, cleaning, laundry, shopping, and community activity   PERSONAL FACTORS Fitness, Past/current experiences, Time since onset of injury/illness/exacerbation, and 3+ comorbidities:    Saddle pulmonary embolii, Heart failure, atrial arrhythmia, CHF, Afib, Respiratory failure, CHOLECYSTECTOMY-2019, COLON SURGERY-Cancer 2013, INCISIONAL HERNIA REPAIR, DM, low back pain, R groin pain, R flank pain, obesityare also affecting patient's functional outcome.      GOALS:   SHORT TERM GOALS: Target date: 02/24/2022  Pt will be Ind in an initial HEP Baseline: Not initiated Goal status: MET   LONG TERM GOALS: Target date: 05/04/2022    Pt will be Ind in a final HEP to maintain achieved LOF Baseline: Not initiated Goal status: ONGOING   2.  Increase hip strength to 4/5 and knee strength to 5/5 for improved functional ability and safety Baseline: see flow sheets Goal status: ONGOING   3.  Improve 5xSTS by MCID of 5" and 2MWT by MCID of 21f as indication of improved functional mobility Baseline: 2 MWT 172 feet 02/21/22, 5 x STS TBA 03/30/2022: 15 seconds  Goal status: ONGOING   4.  Pt's FOTO score will improved to the predicted value of 49% as indication of improved function Baseline: 34% Goal status: ONGOING   PLAN: PT FREQUENCY: 2x/week   PT DURATION: 6 weeks   PLANNED INTERVENTIONS: Therapeutic  exercises, Therapeutic activity, Balance training, Gait training, Patient/Family education, Self Care, Joint mobilization, Cryotherapy, Moist heat, Taping, Manual therapy, and Re-evaluation   PLAN FOR NEXT SESSION: Review FOTO; initiate HEP; use of modalities, manual therapy  SMargarette CanadaPTA  04/12/22 4:59 PM

## 2022-04-16 ENCOUNTER — Other Ambulatory Visit (HOSPITAL_COMMUNITY): Payer: Self-pay | Admitting: Internal Medicine

## 2022-04-19 ENCOUNTER — Ambulatory Visit: Payer: 59 | Attending: Internal Medicine

## 2022-04-19 DIAGNOSIS — R262 Difficulty in walking, not elsewhere classified: Secondary | ICD-10-CM

## 2022-04-19 DIAGNOSIS — R2689 Other abnormalities of gait and mobility: Secondary | ICD-10-CM | POA: Diagnosis present

## 2022-04-19 DIAGNOSIS — M5459 Other low back pain: Secondary | ICD-10-CM | POA: Diagnosis present

## 2022-04-19 DIAGNOSIS — M6281 Muscle weakness (generalized): Secondary | ICD-10-CM | POA: Diagnosis present

## 2022-04-19 NOTE — Therapy (Signed)
OUTPATIENT PHYSICAL THERAPY TREATMENT NOTE    Patient Name: Raymond Allen MRN: [id number] DOB:11-01-1960, 62 y.o., male Today's Date: 04/19/2022  PCP: [place], Julie M, NP   REFERRING PROVIDER: Jolaine Artist, MD  END OF SESSION:   PT End of Session - 04/19/22 1619     Visit Number 15    Number of Visits 21    Date for PT Re-Evaluation 05/04/22    Authorization Type [employer] MEDICARE    Progress Note Due on Visit 10    PT Start Time 1618    PT Stop Time 6834    PT Time Calculation (min) 40 min    Activity Tolerance Patient tolerated treatment well    Behavior During Therapy [facility] for tasks assessed/performed               Past Medical History:  Diagnosis Date   Cancer ([place])    Colon cancer metastasized to liver ([place]) 01/05/2012   Colon carcinoma ([place])    colon ca dx 2001;   Diabetes mellitus without complication ([place])    Hypertension    Muscle weakness-general 01/05/2012   Past Surgical History:  Procedure Laterality Date   CHOLECYSTECTOMY     COLON SURGERY     sigmoid colectomy, appendectomy   INCISIONAL HERNIA REPAIR N/A 11/27/2017   Procedure: LAPAROSCOPIC ASSISTED INCISIONAL HERNIA;  Surgeon: Clovis Riley, MD;  [facility];  Service: General;  Laterality: N/A;   INSERTION OF MESH N/A 11/27/2017   Procedure: INSERTION OF MESH;  Surgeon: Clovis Riley, MD;  [facility];  Service: General;  Laterality: N/A;   LAPAROSCOPIC LYSIS OF ADHESIONS N/A 11/27/2017   Procedure: LAPAROSCOPIC LYSIS OF ADHESIONS;  Surgeon: Clovis Riley, MD;  [facility];  Service: General;  Laterality: N/A;   LIVER SURGERY     partial removal duke in 2003   PORT-A-CATH REMOVAL Right 07/16/2012   Procedure: REMOVAL PORT-A-CATH;  Surgeon: Haywood Lasso, MD;  Location: [place];  Service: General;  Laterality: Right;   RIGHT/LEFT HEART CATH AND CORONARY ANGIOGRAPHY N/A 04/01/2021   Procedure: RIGHT/LEFT HEART CATH AND CORONARY ANGIOGRAPHY;   Surgeon: Jolaine Artist, MD;  Location: [place] [facility];  Service: Cardiovascular;  Laterality: N/A;   Patient Active Problem List   Diagnosis Date Noted   Chronic combined systolic and diastolic CHF (congestive heart failure) ([place]) 12/30/2021   Atrial fibrillation, chronic ([place]) 12/30/2021   Mixed hyperlipidemia 12/30/2021   Obesity (BMI 30-39.9) 12/30/2021   Pulmonary embolism ([place]) 12/29/2021   Physical deconditioning 19/62/2297   Acute systolic heart failure ([place]) 04/05/2021   Pulmonary edema 98/92/1194   Acute metabolic encephalopathy 17/40/8144   CKD (chronic kidney disease) stage 3, GFR 30-59 ml/min (HCC) 04/05/2021   Atrial arrhythmia 04/05/2021   Respiratory distress 03/29/2021   Heart failure, type unknown ([place])    Type 2 diabetes mellitus without complication ([place])    Incisional hernia 11/27/2017   Colon cancer metastasized to liver ([place]) 01/05/2012   FLANK PAIN, RIGHT 01/21/2008   TOBACCO ABUSE 12/26/2007   HYPERGLYCEMIA 12/26/2007   PERS HX NONCOMPLIANCE W/MED TX PRS HAZARDS HLTH 12/26/2007   CARCINOMA, COLON 12/17/2007   Essential hypertension 12/17/2007   Respiratory failure, acute ([place]) 12/17/2007    REFERRING DIAG: R53.81 (ICD-10-CM) - Physical deconditioning  THERAPY DIAG:  Other low back pain  Muscle weakness (generalized)  Other abnormalities of gait and mobility  Difficulty in walking, not elsewhere classified  Rationale for Evaluation and Treatment rehabilitation  PERTINENT HISTORY: Saddle pulmonary embolii, Heart failure, atrial arrhythmia, CHF, Afib, Respiratory failure, CHOLECYSTECTOMY-2019, COLON SURGERY-Cancer 2013, INCISIONAL HERNIA REPAIR, DM, low back pain, R groin pain, R flank pain, obesity  PRECAUTIONS: Other: Cardiopulmonary  SUBJECTIVE:                                                                                                                                                                                      SUBJECTIVE  STATEMENT:  Patient continues to report "the pain is always the same" at 10/10 in his Rt groin area.    PAIN:  Are you having pain?  Yes: NPRS scale: 10/10 Pain location: R groin Pain description: grabbng Aggravating factors: walking Relieving factors: rest Low Back pain is 4/10 and better after injections   OBJECTIVE: (objective measures completed at initial evaluation unless otherwise dated)  PATIENT SURVEYS:  FOTO: Perceived function   34%, predicted   49%    COGNITION:           Overall cognitive status: Within functional limits for tasks assessed                          SENSATION: WFL   EDEMA:  None tested for the lower legs   POSTURE: flexed trunk    PALPATION: NT   LOWER EXTREMITY ROM:            Limited hip flexion due to pain Active ROM Right eval Left eval  Hip flexion      Hip extension      Hip abduction      Hip adduction      Hip internal rotation      Hip external rotation      Knee flexion      Knee extension      Ankle dorsiflexion      Ankle plantarflexion      Ankle inversion      Ankle eversion       (Blank rows = not tested)   LOWER EXTREMITY MMT:   MMT Right eval Left eval  Hip flexion 3 3  Hip extension 3 3  Hip abduction 3 3  Hip adduction 3+ 3+  Hip internal rotation      Hip external rotation 3 3  Knee flexion 4 4  Knee extension 4 4  Ankle dorsiflexion      Ankle plantarflexion      Ankle inversion      Ankle eversion       (Blank rows = not tested)   LOWER EXTREMITY SPECIAL TESTS:  NT   FUNCTIONAL TESTS:  5 times sit to  stand: 15 secs 2 minute walk test: 172 feet 02/21/22   GAIT: Distance walked: 268f pt estimates his walking tolerance to 3017fAssistive device utilized: None Level of assistance: Complete Independence Comments: Antalgic gait pattern over the R LE    [facility]                                                DATE: 04/19/2022 Therapeutic Exercise: NuStep  lvl 5 x 5 min while taking subjective STS x10, x10 w/OH reach - low table Supine SLR 2x15 each Bridge 2x15 with ball squeeze Supine clamshell RTB 2x15 BIL Lateral walk GTB x 4 laps Standing hip abd/ext 2x10 GTB Seated knee ext 2x10 15# Seated hamstring curl 2x10 35# Step ups 6" x10 BIL  [facility] Adult PT Treatment:                                                DATE: 04/12/2022 Therapeutic Exercise: NuStep lvl 5 x 5 min while taking subjective STS x10 - low table Supine SLR 2x15 each Bridge 2x15 Sidelying clamshell RTB 2x15 BIL LAQ 2x10 5# Lateral walk RTB x 3 laps Standing hip abd/ext 2x10 RTB Seated knee ext 2x10 15# Seated hamstring curl 2x10 35#  [facility] Adult PT Treatment:                                                DATE: 04/11/2022 Therapeutic Exercise: NuStep lvl 5 LE x 5 min while taking subjective STS 2x10 - low table Supine SLR 2x15 each Bridge 2x15 Supine clamshell Black TB 2x15 LAQ 2x10 5# Seated hamstring curl 2x10 BTB Lateral walk RTB x 3 laps Standing hip abd/ext 2x10 RTB    PATIENT EDUCATION:  Education details: HEP Person educated: Patient and Caregiver girl friend Education method: Explanation Education comprehension: verbalized understanding and needs further education   HOME EXERCISE PROGRAM: Access Code: [email or web address]: [email or web address] Date: 03/06/2022 Prepared by: DaOctavio MannsExercises - Supine Lower Trunk Rotation  - 1 x daily - 7 x weekly - 2 sets - 10 reps - Supine Bridge  - 1 x daily - 7 x weekly - 2 sets - 10 reps - Active Straight Leg Raise with Quad Set  - 1 x daily - 7 x weekly - 2 sets - 10 reps - Hooklying Clamshell with Resistance  - 1 x daily - 7 x weekly - 2 sets - 15 reps - blue theraband hold - Standing Hip Abduction with Counter Support  - 1 x daily - 7 x weekly - 2 sets - 10 reps - Standing Hip Extension with Counter Support  - 1 x daily - 7 x weekly - 2 sets - 10 reps   ASSESSMENT:   CLINICAL  IMPRESSION: Patient presents to PT with unchanging pain in his Rt groin. Session today continued to focus on proximal hip and LE strengthening. Repetitions and resistance increased as noted above. Introduced step ups to good affect, patient able to complete without an increase in pain. Patient was able to tolerate all prescribed exercises with no  adverse effects and he reports that he feels like he is able to stand while walking easier than prior to PT. Patient continues to benefit from skilled PT services and should be progressed as able to improve functional independence.    OBJECTIVE IMPAIRMENTS Abnormal gait, cardiopulmonary status limiting activity, decreased activity tolerance, decreased balance, decreased knowledge of condition, difficulty walking, decreased ROM, decreased strength, postural dysfunction, obesity, and pain.    ACTIVITY LIMITATIONS carrying, lifting, bending, standing, squatting, stairs, and locomotion level   PARTICIPATION LIMITATIONS: meal prep, cleaning, laundry, shopping, and community activity   PERSONAL FACTORS Fitness, Past/current experiences, Time since onset of injury/illness/exacerbation, and 3+ comorbidities:    Saddle pulmonary embolii, Heart failure, atrial arrhythmia, CHF, Afib, Respiratory failure, CHOLECYSTECTOMY-2019, COLON SURGERY-Cancer 2013, INCISIONAL HERNIA REPAIR, DM, low back pain, R groin pain, R flank pain, obesityare also affecting patient's functional outcome.      GOALS:   SHORT TERM GOALS: Target date: 02/24/2022  Pt will be Ind in an initial HEP Baseline: Not initiated Goal status: MET   LONG TERM GOALS: Target date: 05/04/2022    Pt will be Ind in a final HEP to maintain achieved LOF Baseline: Not initiated Goal status: ONGOING   2.  Increase hip strength to 4/5 and knee strength to 5/5 for improved functional ability and safety Baseline: see flow sheets Goal status: ONGOING   3.  Improve 5xSTS by MCID of 5" and 2MWT by MCID of 27f  as indication of improved functional mobility Baseline: 2 MWT 172 feet 02/21/22, 5 x STS TBA 03/30/2022: 15 seconds  Goal status: ONGOING   4.  Pt's FOTO score will improved to the predicted value of 49% as indication of improved function Baseline: 34% Goal status: ONGOING   PLAN: PT FREQUENCY: 2x/week   PT DURATION: 6 weeks   PLANNED INTERVENTIONS: Therapeutic exercises, Therapeutic activity, Balance training, Gait training, Patient/Family education, Self Care, Joint mobilization, Cryotherapy, Moist heat, Taping, Manual therapy, and Re-evaluation   PLAN FOR NEXT SESSION: Review FOTO; initiate HEP; use of modalities, manual therapy  SMargarette CanadaPTA  04/19/22 4:59 PM

## 2022-04-20 ENCOUNTER — Ambulatory Visit: Payer: 59

## 2022-04-20 DIAGNOSIS — M5459 Other low back pain: Secondary | ICD-10-CM

## 2022-04-20 DIAGNOSIS — R2689 Other abnormalities of gait and mobility: Secondary | ICD-10-CM

## 2022-04-20 DIAGNOSIS — M6281 Muscle weakness (generalized): Secondary | ICD-10-CM

## 2022-04-20 DIAGNOSIS — R262 Difficulty in walking, not elsewhere classified: Secondary | ICD-10-CM

## 2022-04-20 NOTE — Therapy (Signed)
OUTPATIENT PHYSICAL THERAPY TREATMENT NOTE    Patient Name: Raymond Allen MRN: [id number] DOB:10/18/1960, 62 y.o., male Today's Date: 04/20/2022  PCP: [place], Julie M, NP   REFERRING PROVIDER: Jolaine Artist, MD  END OF SESSION:   PT End of Session - 04/20/22 1704     Visit Number 16    Number of Visits 21    Date for PT Re-Evaluation 05/04/22    Authorization Type [employer] MEDICARE    Progress Note Due on Visit 10    PT Start Time 5462    PT Stop Time 7035    PT Time Calculation (min) 39 min    Activity Tolerance Patient tolerated treatment well    Behavior During Therapy [facility] for tasks assessed/performed             Past Medical History:  Diagnosis Date   Cancer ([place])    Colon cancer metastasized to liver ([place]) 01/05/2012   Colon carcinoma ([place])    colon ca dx 2001;   Diabetes mellitus without complication ([place])    Hypertension    Muscle weakness-general 01/05/2012   Past Surgical History:  Procedure Laterality Date   CHOLECYSTECTOMY     COLON SURGERY     sigmoid colectomy, appendectomy   INCISIONAL HERNIA REPAIR N/A 11/27/2017   Procedure: LAPAROSCOPIC ASSISTED INCISIONAL HERNIA;  Surgeon: Clovis Riley, MD;  [facility];  Service: General;  Laterality: N/A;   INSERTION OF MESH N/A 11/27/2017   Procedure: INSERTION OF MESH;  Surgeon: Clovis Riley, MD;  [facility];  Service: General;  Laterality: N/A;   LAPAROSCOPIC LYSIS OF ADHESIONS N/A 11/27/2017   Procedure: LAPAROSCOPIC LYSIS OF ADHESIONS;  Surgeon: Clovis Riley, MD;  [facility];  Service: General;  Laterality: N/A;   LIVER SURGERY     partial removal duke in 2003   PORT-A-CATH REMOVAL Right 07/16/2012   Procedure: REMOVAL PORT-A-CATH;  Surgeon: Haywood Lasso, MD;  [facility]: [facility];  Service: General;  Laterality: Right;   RIGHT/LEFT HEART CATH AND CORONARY ANGIOGRAPHY N/A 04/01/2021   Procedure: RIGHT/LEFT HEART CATH AND CORONARY ANGIOGRAPHY;  Surgeon:  Jolaine Artist, MD;  Location: [place] [facility];  Service: Cardiovascular;  Laterality: N/A;   Patient Active Problem List   Diagnosis Date Noted   Chronic combined systolic and diastolic CHF (congestive heart failure) ([place]) 12/30/2021   Atrial fibrillation, chronic ([place]) 12/30/2021   Mixed hyperlipidemia 12/30/2021   Obesity (BMI 30-39.9) 12/30/2021   Pulmonary embolism ([place]) 12/29/2021   Physical deconditioning 00/93/8182   Acute systolic heart failure ([place]) 04/05/2021   Pulmonary edema 99/37/1696   Acute metabolic encephalopathy 78/93/8101   CKD (chronic kidney disease) stage 3, GFR 30-59 ml/min (HCC) 04/05/2021   Atrial arrhythmia 04/05/2021   Respiratory distress 03/29/2021   Heart failure, type unknown ([place])    Type 2 diabetes mellitus without complication ([place])    Incisional hernia 11/27/2017   Colon cancer metastasized to liver ([place]) 01/05/2012   FLANK PAIN, RIGHT 01/21/2008   TOBACCO ABUSE 12/26/2007   HYPERGLYCEMIA 12/26/2007   PERS HX NONCOMPLIANCE W/MED TX PRS HAZARDS HLTH 12/26/2007   CARCINOMA, COLON 12/17/2007   Essential hypertension 12/17/2007   Respiratory failure, acute ([place]) 12/17/2007    REFERRING DIAG: R53.81 (ICD-10-CM) - Physical deconditioning  THERAPY DIAG:  Other low back pain  Muscle weakness (generalized)  Other abnormalities of gait and mobility  Difficulty in walking, not elsewhere classified  Rationale for Evaluation and Treatment rehabilitation  PERTINENT  HISTORY: Saddle pulmonary embolii, Heart failure, atrial arrhythmia, CHF, Afib, Respiratory failure, CHOLECYSTECTOMY-2019, COLON SURGERY-Cancer 2013, INCISIONAL HERNIA REPAIR, DM, low back pain, R groin pain, R flank pain, obesity  PRECAUTIONS: Other: Cardiopulmonary  SUBJECTIVE:                                                                                                                                                                                      SUBJECTIVE  STATEMENT: Patient continues to report "the pain is always the same."    PAIN:  Are you having pain?  Yes: NPRS scale: 10/10 Pain location: R groin Pain description: grabbng Aggravating factors: walking Relieving factors: rest Low Back pain is 4/10 and better after injections   OBJECTIVE: (objective measures completed at initial evaluation unless otherwise dated)  PATIENT SURVEYS:  FOTO: Perceived function   34%, predicted   49%    COGNITION:           Overall cognitive status: Within functional limits for tasks assessed                          SENSATION: WFL   EDEMA:  None tested for the lower legs   POSTURE: flexed trunk    PALPATION: NT   LOWER EXTREMITY ROM:            Limited hip flexion due to pain Active ROM Right eval Left eval  Hip flexion      Hip extension      Hip abduction      Hip adduction      Hip internal rotation      Hip external rotation      Knee flexion      Knee extension      Ankle dorsiflexion      Ankle plantarflexion      Ankle inversion      Ankle eversion       (Blank rows = not tested)   LOWER EXTREMITY MMT:   MMT Right eval Left eval  Hip flexion 3 3  Hip extension 3 3  Hip abduction 3 3  Hip adduction 3+ 3+  Hip internal rotation      Hip external rotation 3 3  Knee flexion 4 4  Knee extension 4 4  Ankle dorsiflexion      Ankle plantarflexion      Ankle inversion      Ankle eversion       (Blank rows = not tested)   LOWER EXTREMITY SPECIAL TESTS:  NT   FUNCTIONAL TESTS:  5 times sit to stand: 15 secs 2 minute walk test: 172 feet  02/21/22   GAIT: Distance walked: 217f pt estimates his walking tolerance to 3066fAssistive device utilized: None Level of assistance: Complete Independence Comments: Antalgic gait pattern over the R LE    [facility]                                                DATE: 04/20/2022 Therapeutic Exercise: NuStep lvl 5 x 6 min while taking  subjective STS x10 w/OH reach - low table Supine SLR 2x15 each Bridge 2x15 with ball squeeze Supine clamshell GTB 2x15 BIL Standing hip abd/ext 2x10 GTB Seated knee ext 2x10 15# Seated hamstring curl 2x10 35# Step ups 8" 2x10 BIL  [facility] Adult PT Treatment:                                                DATE: 04/19/2022 Therapeutic Exercise: NuStep lvl 5 x 5 min while taking subjective STS x10, x10 w/OH reach - low table Supine SLR 2x15 each Bridge 2x15 with ball squeeze Supine clamshell RTB 2x15 BIL Lateral walk GTB x 4 laps Standing hip abd/ext 2x10 GTB Seated knee ext 2x10 15# Seated hamstring curl 2x10 35# Step ups 6" x10 BIL  [facility] Adult PT Treatment:                                                DATE: 04/12/2022 Therapeutic Exercise: NuStep lvl 5 x 5 min while taking subjective STS x10 - low table Supine SLR 2x15 each Bridge 2x15 Sidelying clamshell RTB 2x15 BIL LAQ 2x10 5# Lateral walk RTB x 3 laps Standing hip abd/ext 2x10 RTB Seated knee ext 2x10 15# Seated hamstring curl 2x10 35#     PATIENT EDUCATION:  Education details: HEP Person educated: Patient and Caregiver girl friend Education method: Explanation Education comprehension: verbalized understanding and needs further education   HOME EXERCISE PROGRAM: Access Code: [email or web address]: [email or web address] Date: 03/06/2022 Prepared by: DaOctavio MannsExercises - Supine Lower Trunk Rotation  - 1 x daily - 7 x weekly - 2 sets - 10 reps - Supine Bridge  - 1 x daily - 7 x weekly - 2 sets - 10 reps - Active Straight Leg Raise with Quad Set  - 1 x daily - 7 x weekly - 2 sets - 10 reps - Hooklying Clamshell with Resistance  - 1 x daily - 7 x weekly - 2 sets - 15 reps - blue theraband hold - Standing Hip Abduction with Counter Support  - 1 x daily - 7 x weekly - 2 sets - 10 reps - Standing Hip Extension with Counter Support  - 1 x daily - 7 x weekly - 2 sets - 10 reps   ASSESSMENT:   CLINICAL  IMPRESSION: Patient presents to PT with unchanging pain in his Rt groin. Session today continued to focus on proximal hip and LE strengthening. Patient was able to tolerate all prescribed exercises with no adverse effects and he reports that he feels like he is able to stand while walking easier than prior to PT. Patient continues to benefit  from skilled PT services and should be progressed as able to improve functional independence.    OBJECTIVE IMPAIRMENTS Abnormal gait, cardiopulmonary status limiting activity, decreased activity tolerance, decreased balance, decreased knowledge of condition, difficulty walking, decreased ROM, decreased strength, postural dysfunction, obesity, and pain.    ACTIVITY LIMITATIONS carrying, lifting, bending, standing, squatting, stairs, and locomotion level   PARTICIPATION LIMITATIONS: meal prep, cleaning, laundry, shopping, and community activity   PERSONAL FACTORS Fitness, Past/current experiences, Time since onset of injury/illness/exacerbation, and 3+ comorbidities:    Saddle pulmonary embolii, Heart failure, atrial arrhythmia, CHF, Afib, Respiratory failure, CHOLECYSTECTOMY-2019, COLON SURGERY-Cancer 2013, INCISIONAL HERNIA REPAIR, DM, low back pain, R groin pain, R flank pain, obesityare also affecting patient's functional outcome.      GOALS:   SHORT TERM GOALS: Target date: 02/24/2022  Pt will be Ind in an initial HEP Baseline: Not initiated Goal status: MET   LONG TERM GOALS: Target date: 05/04/2022    Pt will be Ind in a final HEP to maintain achieved LOF Baseline: Not initiated Goal status: ONGOING   2.  Increase hip strength to 4/5 and knee strength to 5/5 for improved functional ability and safety Baseline: see flow sheets Goal status: ONGOING   3.  Improve 5xSTS by MCID of 5" and 2MWT by MCID of 55f as indication of improved functional mobility Baseline: 2 MWT 172 feet 02/21/22, 5 x STS TBA 03/30/2022: 15 seconds  Goal status: ONGOING    4.  Pt's FOTO score will improved to the predicted value of 49% as indication of improved function Baseline: 34% Goal status: ONGOING   PLAN: PT FREQUENCY: 2x/week   PT DURATION: 6 weeks   PLANNED INTERVENTIONS: Therapeutic exercises, Therapeutic activity, Balance training, Gait training, Patient/Family education, Self Care, Joint mobilization, Cryotherapy, Moist heat, Taping, Manual therapy, and Re-evaluation   PLAN FOR NEXT SESSION: Review FOTO; initiate HEP; use of modalities, manual therapy  SMargarette CanadaPTA  04/20/22 5:42 PM

## 2022-04-25 ENCOUNTER — Ambulatory Visit: Payer: 59

## 2022-04-25 NOTE — Therapy (Incomplete)
OUTPATIENT PHYSICAL THERAPY TREATMENT NOTE    Patient Name: Raymond Allen MRN: [id number] DOB:1960-07-18, 62 y.o., male Today's Date: 04/25/2022  PCP: [place], Julie M, NP   REFERRING PROVIDER: Jolaine Artist, MD  END OF SESSION:     Past Medical History:  Diagnosis Date   Cancer [facility])    Colon cancer metastasized to liver [facility]) 01/05/2012   Colon carcinoma ([place])    colon ca dx 2001;   Diabetes mellitus without complication ([place])    Hypertension    Muscle weakness-general 01/05/2012   Past Surgical History:  Procedure Laterality Date   CHOLECYSTECTOMY     COLON SURGERY     sigmoid colectomy, appendectomy   INCISIONAL HERNIA REPAIR N/A 11/27/2017   Procedure: LAPAROSCOPIC ASSISTED INCISIONAL HERNIA;  Surgeon: Clovis Riley, MD;  [facility];  Service: General;  Laterality: N/A;   INSERTION OF MESH N/A 11/27/2017   Procedure: INSERTION OF MESH;  Surgeon: Clovis Riley, MD;  [facility];  Service: General;  Laterality: N/A;   LAPAROSCOPIC LYSIS OF ADHESIONS N/A 11/27/2017   Procedure: LAPAROSCOPIC LYSIS OF ADHESIONS;  Surgeon: Clovis Riley, MD;  [facility];  Service: General;  Laterality: N/A;   LIVER SURGERY     partial removal duke in 2003   PORT-A-CATH REMOVAL Right 07/16/2012   Procedure: REMOVAL PORT-A-CATH;  Surgeon: Haywood Lasso, MD;  Location: [place];  Service: General;  Laterality: Right;   RIGHT/LEFT HEART CATH AND CORONARY ANGIOGRAPHY N/A 04/01/2021   Procedure: RIGHT/LEFT HEART CATH AND CORONARY ANGIOGRAPHY;  Surgeon: Jolaine Artist, MD;  Location: [place] [facility];  Service: Cardiovascular;  Laterality: N/A;   Patient Active Problem List   Diagnosis Date Noted   Chronic combined systolic and diastolic CHF (congestive heart failure) ([place]) 12/30/2021   Atrial fibrillation, chronic ([place]) 12/30/2021   Mixed hyperlipidemia 12/30/2021   Obesity (BMI 30-39.9) 12/30/2021   Pulmonary embolism ([place])  12/29/2021   Physical deconditioning 67/20/9470   Acute systolic heart failure ([place]) 04/05/2021   Pulmonary edema 96/28/3662   Acute metabolic encephalopathy 94/76/5465   CKD (chronic kidney disease) stage 3, GFR 30-59 ml/min (HCC) 04/05/2021   Atrial arrhythmia 04/05/2021   Respiratory distress 03/29/2021   Heart failure, type unknown ([place])    Type 2 diabetes mellitus without complication ([place])    Incisional hernia 11/27/2017   Colon cancer metastasized to liver ([place]) 01/05/2012   FLANK PAIN, RIGHT 01/21/2008   TOBACCO ABUSE 12/26/2007   HYPERGLYCEMIA 12/26/2007   PERS HX NONCOMPLIANCE W/MED TX PRS HAZARDS HLTH 12/26/2007   CARCINOMA, COLON 12/17/2007   Essential hypertension 12/17/2007   Respiratory failure, acute ([place]) 12/17/2007    REFERRING DIAG: R53.81 (ICD-10-CM) - Physical deconditioning  THERAPY DIAG:  No diagnosis found.  Rationale for Evaluation and Treatment rehabilitation  PERTINENT HISTORY: Saddle pulmonary embolii, Heart failure, atrial arrhythmia, CHF, Afib, Respiratory failure, CHOLECYSTECTOMY-2019, COLON SURGERY-Cancer 2013, INCISIONAL HERNIA REPAIR, DM, low back pain, R groin pain, R flank pain, obesity  PRECAUTIONS: Other: Cardiopulmonary  SUBJECTIVE:  SUBJECTIVE STATEMENT: *** Patient continues to report "the pain is always the same."    PAIN:  Are you having pain?  Yes: NPRS scale: ***10/10 Pain location: R groin Pain description: grabbng Aggravating factors: walking Relieving factors: rest Low Back pain is 4/10 and better after injections   OBJECTIVE: (objective measures completed at initial evaluation unless otherwise dated)  PATIENT SURVEYS:  FOTO: Perceived function   34%, predicted   49%    COGNITION:           Overall cognitive status: Within functional  limits for tasks assessed                          SENSATION: WFL   EDEMA:  None tested for the lower legs   POSTURE: flexed trunk    PALPATION: NT   LOWER EXTREMITY ROM:            Limited hip flexion due to pain Active ROM Right eval Left eval  Hip flexion      Hip extension      Hip abduction      Hip adduction      Hip internal rotation      Hip external rotation      Knee flexion      Knee extension      Ankle dorsiflexion      Ankle plantarflexion      Ankle inversion      Ankle eversion       (Blank rows = not tested)   LOWER EXTREMITY MMT:   MMT Right eval Left eval  Hip flexion 3 3  Hip extension 3 3  Hip abduction 3 3  Hip adduction 3+ 3+  Hip internal rotation      Hip external rotation 3 3  Knee flexion 4 4  Knee extension 4 4  Ankle dorsiflexion      Ankle plantarflexion      Ankle inversion      Ankle eversion       (Blank rows = not tested)   LOWER EXTREMITY SPECIAL TESTS:  NT   FUNCTIONAL TESTS:  5 times sit to stand: 15 secs 2 minute walk test: 172 feet 02/21/22   GAIT: Distance walked: 235f pt estimates his walking tolerance to 3060fAssistive device utilized: None Level of assistance: Complete Independence Comments: Antalgic gait pattern over the R LE    [facility]                                                DATE: 04/25/2022 Therapeutic Exercise: NuStep lvl 5 x 6 min while taking subjective STS x10 w/OH reach - low table Supine SLR 2x15 each Bridge 2x15 with ball squeeze Supine clamshell GTB 2x15 BIL Standing hip abd/ext 2x10 GTB Seated knee ext 2x10 15# Seated hamstring curl 2x10 35# Step ups 8" 2x10 BIL  [facility] Adult PT Treatment:                                                DATE: 04/20/2022 Therapeutic Exercise: NuStep lvl 5 x 6 min while taking subjective STS x10 w/OH reach - low table Supine SLR 2x15  each Bridge 2x15 with ball squeeze Supine clamshell GTB 2x15 BIL Standing hip  abd/ext 2x10 GTB Seated knee ext 2x10 15# Seated hamstring curl 2x10 35# Step ups 8" 2x10 BIL  [facility] Adult PT Treatment:                                                DATE: 04/19/2022 Therapeutic Exercise: NuStep lvl 5 x 5 min while taking subjective STS x10, x10 w/OH reach - low table Supine SLR 2x15 each Bridge 2x15 with ball squeeze Supine clamshell RTB 2x15 BIL Lateral walk GTB x 4 laps Standing hip abd/ext 2x10 GTB Seated knee ext 2x10 15# Seated hamstring curl 2x10 35# Step ups 6" x10 BIL     PATIENT EDUCATION:  Education details: HEP Person educated: Patient and Caregiver girl friend Education method: Explanation Education comprehension: verbalized understanding and needs further education   HOME EXERCISE PROGRAM: Access Code: [email or web address] URL: [email or web address] Date: 03/06/2022 Prepared by: Octavio Manns  Exercises - Supine Lower Trunk Rotation  - 1 x daily - 7 x weekly - 2 sets - 10 reps - Supine Bridge  - 1 x daily - 7 x weekly - 2 sets - 10 reps - Active Straight Leg Raise with Quad Set  - 1 x daily - 7 x weekly - 2 sets - 10 reps - Hooklying Clamshell with Resistance  - 1 x daily - 7 x weekly - 2 sets - 15 reps - blue theraband hold - Standing Hip Abduction with Counter Support  - 1 x daily - 7 x weekly - 2 sets - 10 reps - Standing Hip Extension with Counter Support  - 1 x daily - 7 x weekly - 2 sets - 10 reps   ASSESSMENT:   CLINICAL IMPRESSION: ***  Patient presents to PT with unchanging pain in his Rt groin. Session today continued to focus on proximal hip and LE strengthening. Patient was able to tolerate all prescribed exercises with no adverse effects and he reports that he feels like he is able to stand while walking easier than prior to PT. Patient continues to benefit from skilled PT services and should be progressed as able to improve functional independence.    OBJECTIVE IMPAIRMENTS Abnormal gait, cardiopulmonary status limiting  activity, decreased activity tolerance, decreased balance, decreased knowledge of condition, difficulty walking, decreased ROM, decreased strength, postural dysfunction, obesity, and pain.    ACTIVITY LIMITATIONS carrying, lifting, bending, standing, squatting, stairs, and locomotion level   PARTICIPATION LIMITATIONS: meal prep, cleaning, laundry, shopping, and community activity   PERSONAL FACTORS Fitness, Past/current experiences, Time since onset of injury/illness/exacerbation, and 3+ comorbidities:    Saddle pulmonary embolii, Heart failure, atrial arrhythmia, CHF, Afib, Respiratory failure, CHOLECYSTECTOMY-2019, COLON SURGERY-Cancer 2013, INCISIONAL HERNIA REPAIR, DM, low back pain, R groin pain, R flank pain, obesityare also affecting patient's functional outcome.      GOALS:   SHORT TERM GOALS: Target date: 02/24/2022  Pt will be Ind in an initial HEP Baseline: Not initiated Goal status: MET   LONG TERM GOALS: Target date: 05/04/2022    Pt will be Ind in a final HEP to maintain achieved LOF Baseline: Not initiated Goal status: ONGOING   2.  Increase hip strength to 4/5 and knee strength to 5/5 for improved functional ability and safety Baseline: see flow sheets Goal status: ONGOING   3.  Improve  5xSTS by MCID of 5" and 2MWT by MCID of 58f as indication of improved functional mobility Baseline: 2 MWT 172 feet 02/21/22, 5 x STS TBA 03/30/2022: 15 seconds  Goal status: ONGOING   4.  Pt's FOTO score will improved to the predicted value of 49% as indication of improved function Baseline: 34% Goal status: ONGOING   PLAN: PT FREQUENCY: 2x/week   PT DURATION: 6 weeks   PLANNED INTERVENTIONS: Therapeutic exercises, Therapeutic activity, Balance training, Gait training, Patient/Family education, Self Care, Joint mobilization, Cryotherapy, Moist heat, Taping, Manual therapy, and Re-evaluation   PLAN FOR NEXT SESSION: Review FOTO; initiate HEP; use of modalities, manual  therapy  SMargarette CanadaPTA  04/25/22 9:40 AM

## 2022-04-26 NOTE — Therapy (Signed)
OUTPATIENT PHYSICAL THERAPY TREATMENT NOTE    Patient Name: Raymond Allen MRN: [id number] DOB:06-11-1960, 62 y.o., male Today's Date: 04/27/2022  PCP: [place], Julie M, NP   REFERRING PROVIDER: Jolaine Artist, MD  END OF SESSION:   PT End of Session - 04/27/22 1618     Visit Number 17    Number of Visits 21    Date for PT Re-Evaluation 05/04/22    Authorization Type [employer] MEDICARE    Progress Note Due on Visit 10    PT Start Time 1619    PT Stop Time 1700    PT Time Calculation (min) 41 min    Activity Tolerance Patient tolerated treatment well    Behavior During Therapy [facility] for tasks assessed/performed              Past Medical History:  Diagnosis Date   Cancer ([place])    Colon cancer metastasized to liver ([place]) 01/05/2012   Colon carcinoma ([place])    colon ca dx 2001;   Diabetes mellitus without complication ([place])    Hypertension    Muscle weakness-general 01/05/2012   Past Surgical History:  Procedure Laterality Date   CHOLECYSTECTOMY     COLON SURGERY     sigmoid colectomy, appendectomy   INCISIONAL HERNIA REPAIR N/A 11/27/2017   Procedure: LAPAROSCOPIC ASSISTED INCISIONAL HERNIA;  Surgeon: Clovis Riley, MD;  [facility]: [facility];  Service: General;  Laterality: N/A;   INSERTION OF MESH N/A 11/27/2017   Procedure: INSERTION OF MESH;  Surgeon: Clovis Riley, MD;  [facility];  Service: General;  Laterality: N/A;   LAPAROSCOPIC LYSIS OF ADHESIONS N/A 11/27/2017   Procedure: LAPAROSCOPIC LYSIS OF ADHESIONS;  Surgeon: Clovis Riley, MD;  [facility];  Service: General;  Laterality: N/A;   LIVER SURGERY     partial removal duke in 2003   PORT-A-CATH REMOVAL Right 07/16/2012   Procedure: REMOVAL PORT-A-CATH;  Surgeon: Haywood Lasso, MD;  Location: [place];  Service: General;  Laterality: Right;   RIGHT/LEFT HEART CATH AND CORONARY ANGIOGRAPHY N/A 04/01/2021   Procedure: RIGHT/LEFT HEART CATH AND CORONARY ANGIOGRAPHY;   Surgeon: Jolaine Artist, MD;  Location: [place] [facility];  Service: Cardiovascular;  Laterality: N/A;   Patient Active Problem List   Diagnosis Date Noted   Chronic combined systolic and diastolic CHF (congestive heart failure) ([place]) 12/30/2021   Atrial fibrillation, chronic ([place]) 12/30/2021   Mixed hyperlipidemia 12/30/2021   Obesity (BMI 30-39.9) 12/30/2021   Pulmonary embolism ([place]) 12/29/2021   Physical deconditioning 25/85/2778   Acute systolic heart failure ([place]) 04/05/2021   Pulmonary edema 24/23/5361   Acute metabolic encephalopathy 44/31/5400   CKD (chronic kidney disease) stage 3, GFR 30-59 ml/min (HCC) 04/05/2021   Atrial arrhythmia 04/05/2021   Respiratory distress 03/29/2021   Heart failure, type unknown ([place])    Type 2 diabetes mellitus without complication ([place])    Incisional hernia 11/27/2017   Colon cancer metastasized to liver ([place]) 01/05/2012   FLANK PAIN, RIGHT 01/21/2008   TOBACCO ABUSE 12/26/2007   HYPERGLYCEMIA 12/26/2007   PERS HX NONCOMPLIANCE W/MED TX PRS HAZARDS HLTH 12/26/2007   CARCINOMA, COLON 12/17/2007   Essential hypertension 12/17/2007   Respiratory failure, acute ([place]) 12/17/2007    REFERRING DIAG: R53.81 (ICD-10-CM) - Physical deconditioning  THERAPY DIAG:  Other low back pain  Muscle weakness (generalized)  Other abnormalities of gait and mobility  Difficulty in walking, not elsewhere classified  Rationale for Evaluation and Treatment rehabilitation  PERTINENT HISTORY: Saddle pulmonary embolii, Heart failure, atrial arrhythmia, CHF, Afib, Respiratory failure, CHOLECYSTECTOMY-2019, COLON SURGERY-Cancer 2013, INCISIONAL HERNIA REPAIR, DM, low back pain, R groin pain, R flank pain, obesity  PRECAUTIONS: Other: Cardiopulmonary  SUBJECTIVE:                                                                                                                                                                                      SUBJECTIVE  STATEMENT: Pt presents to PT with reports of continued severe R groin pain. He also notes that his lower back and legs are hurting him more, feels like his shot is wearing off. He is ready to begin PT at this time.    PAIN:  Are you having pain?  Yes: NPRS scale: 10/10 Pain location: R groin Pain description: grabbng Aggravating factors: walking Relieving factors: rest Low Back pain is 4/10 and better after injections   OBJECTIVE: (objective measures completed at initial evaluation unless otherwise dated)  PATIENT SURVEYS:  FOTO: Perceived function   34%, predicted   49%    COGNITION:           Overall cognitive status: Within functional limits for tasks assessed                          SENSATION: WFL   EDEMA:  None tested for the lower legs   POSTURE: flexed trunk    PALPATION: NT   LOWER EXTREMITY ROM:            Limited hip flexion due to pain Active ROM Right eval Left eval  Hip flexion      Hip extension      Hip abduction      Hip adduction      Hip internal rotation      Hip external rotation      Knee flexion      Knee extension      Ankle dorsiflexion      Ankle plantarflexion      Ankle inversion      Ankle eversion       (Blank rows = not tested)   LOWER EXTREMITY MMT:   MMT Right eval Left eval  Hip flexion 3 3  Hip extension 3 3  Hip abduction 3 3  Hip adduction 3+ 3+  Hip internal rotation      Hip external rotation 3 3  Knee flexion 4 4  Knee extension 4 4  Ankle dorsiflexion      Ankle plantarflexion      Ankle inversion      Ankle eversion       (  Blank rows = not tested)   LOWER EXTREMITY SPECIAL TESTS:  NT   FUNCTIONAL TESTS:  5 times sit to stand: 15 secs 2 minute walk test: 172 feet 02/21/22   GAIT: Distance walked: 223f pt estimates his walking tolerance to 3046fAssistive device utilized: None Level of assistance: Complete Independence Comments: Antalgic gait pattern over the R LE    [facility]                                                DATE: 04/27/2022 Therapeutic Exercise: NuStep lvl 5 x 5 min while taking subjective STS x 10 w/OH press yellow ball Supine SLR 2x15 each Bridge 2x15 with ball squeeze Lateral walk GTB x 3 laps at counter Standing hip abd/ext 2x10 GTB Seated knee ext 2x10 15# Seated hamstring curl 2x10 35# Step ups 8" 2x10 BIL  [facility] Adult PT Treatment:                                                DATE: 04/20/2022 Therapeutic Exercise: NuStep lvl 5 x 6 min while taking subjective STS x10 w/OH reach - low table Supine SLR 2x15 each Bridge 2x15 with ball squeeze Supine clamshell GTB 2x15 BIL Standing hip abd/ext 2x10 GTB Seated knee ext 2x10 15# Seated hamstring curl 2x10 35# Step ups 8" 2x10 BIL  [facility] Adult PT Treatment:                                                DATE: 04/19/2022 Therapeutic Exercise: NuStep lvl 5 x 5 min while taking subjective STS x10, x10 w/OH reach - low table Supine SLR 2x15 each Bridge 2x15 with ball squeeze Supine clamshell RTB 2x15 BIL Lateral walk GTB x 4 laps Standing hip abd/ext 2x10 GTB Seated knee ext 2x10 15# Seated hamstring curl 2x10 35# Step ups 6" x10 BIL  [facility] Adult PT Treatment:                                                DATE: 04/12/2022 Therapeutic Exercise: NuStep lvl 5 x 5 min while taking subjective STS x10 - low table Supine SLR 2x15 each Bridge 2x15 Sidelying clamshell RTB 2x15 BIL LAQ 2x10 5# Lateral walk RTB x 3 laps Standing hip abd/ext 2x10 RTB Seated knee ext 2x10 15# Seated hamstring curl 2x10 35#     PATIENT EDUCATION:  Education details: HEP Person educated: Patient and Caregiver girl friend Education method: Explanation Education comprehension: verbalized understanding and needs further education   HOME EXERCISE PROGRAM: Access Code: [email or web address]: [email or web address] Date: 03/06/2022 Prepared by: DaOctavio MannsExercises - Supine Lower  Trunk Rotation  - 1 x daily - 7 x weekly - 2 sets - 10 reps - Supine Bridge  - 1 x daily - 7 x weekly - 2 sets - 10 reps - Active Straight Leg Raise with Quad Set  - 1 x daily - 7  x weekly - 2 sets - 10 reps - Hooklying Clamshell with Resistance  - 1 x daily - 7 x weekly - 2 sets - 15 reps - blue theraband hold - Standing Hip Abduction with Counter Support  - 1 x daily - 7 x weekly - 2 sets - 10 reps - Standing Hip Extension with Counter Support  - 1 x daily - 7 x weekly - 2 sets - 10 reps   ASSESSMENT:   CLINICAL IMPRESSION: Pt was able to complete all prescribed exercises with no adverse effect or increase in pain. Therapy focused on improving LE strength to decrease pain and improve functional mobility. Pt continues to benefit from skilled services, will progress as able per POC.     OBJECTIVE IMPAIRMENTS Abnormal gait, cardiopulmonary status limiting activity, decreased activity tolerance, decreased balance, decreased knowledge of condition, difficulty walking, decreased ROM, decreased strength, postural dysfunction, obesity, and pain.    ACTIVITY LIMITATIONS carrying, lifting, bending, standing, squatting, stairs, and locomotion level   PARTICIPATION LIMITATIONS: meal prep, cleaning, laundry, shopping, and community activity   PERSONAL FACTORS Fitness, Past/current experiences, Time since onset of injury/illness/exacerbation, and 3+ comorbidities:    Saddle pulmonary embolii, Heart failure, atrial arrhythmia, CHF, Afib, Respiratory failure, CHOLECYSTECTOMY-2019, COLON SURGERY-Cancer 2013, INCISIONAL HERNIA REPAIR, DM, low back pain, R groin pain, R flank pain, obesityare also affecting patient's functional outcome.      GOALS:   SHORT TERM GOALS: Target date: 02/24/2022  Pt will be Ind in an initial HEP Baseline: Not initiated Goal status: MET   LONG TERM GOALS: Target date: 05/04/2022    Pt will be Ind in a final HEP to maintain achieved LOF Baseline: Not initiated Goal status:  ONGOING   2.  Increase hip strength to 4/5 and knee strength to 5/5 for improved functional ability and safety Baseline: see flow sheets Goal status: ONGOING   3.  Improve 5xSTS by MCID of 5" and 2MWT by MCID of 64f as indication of improved functional mobility Baseline: 2 MWT 172 feet 02/21/22, 5 x STS TBA 03/30/2022: 15 seconds  Goal status: ONGOING   4.  Pt's FOTO score will improved to the predicted value of 49% as indication of improved function Baseline: 34% Goal status: ONGOING   PLAN: PT FREQUENCY: 2x/week   PT DURATION: 6 weeks   PLANNED INTERVENTIONS: Therapeutic exercises, Therapeutic activity, Balance training, Gait training, Patient/Family education, Self Care, Joint mobilization, Cryotherapy, Moist heat, Taping, Manual therapy, and Re-evaluation   PLAN FOR NEXT SESSION: Review FOTO; initiate HEP; use of modalities, manual therapy  DWard ChattersPT  04/27/22 5:13 PM

## 2022-04-27 ENCOUNTER — Ambulatory Visit: Payer: 59

## 2022-04-27 DIAGNOSIS — M5459 Other low back pain: Secondary | ICD-10-CM | POA: Diagnosis not present

## 2022-04-27 DIAGNOSIS — R262 Difficulty in walking, not elsewhere classified: Secondary | ICD-10-CM

## 2022-04-27 DIAGNOSIS — R2689 Other abnormalities of gait and mobility: Secondary | ICD-10-CM

## 2022-04-27 DIAGNOSIS — M6281 Muscle weakness (generalized): Secondary | ICD-10-CM

## 2022-05-01 ENCOUNTER — Ambulatory Visit: Payer: 59

## 2022-05-01 DIAGNOSIS — M6281 Muscle weakness (generalized): Secondary | ICD-10-CM

## 2022-05-01 DIAGNOSIS — M5459 Other low back pain: Secondary | ICD-10-CM

## 2022-05-01 DIAGNOSIS — R2689 Other abnormalities of gait and mobility: Secondary | ICD-10-CM

## 2022-05-01 NOTE — Therapy (Signed)
OUTPATIENT PHYSICAL THERAPY TREATMENT NOTE    Patient Name: Raymond Allen MRN: [id number] DOB:February 11, 1961, 62 y.o., male Today's Date: 05/02/2022  PCP: [place], Julie M, NP   REFERRING PROVIDER: Jolaine Artist, MD  END OF SESSION:   PT End of Session - 05/01/22 1617     Visit Number 18    Number of Visits 21    Date for PT Re-Evaluation 05/04/22    Authorization Type [employer] MEDICARE    Progress Note Due on Visit 10    PT Start Time 1617    PT Stop Time 2119    PT Time Calculation (min) 41 min    Activity Tolerance Patient tolerated treatment well    Behavior During Therapy [facility] for tasks assessed/performed               Past Medical History:  Diagnosis Date   Cancer ([place])    Colon cancer metastasized to liver ([place]) 01/05/2012   Colon carcinoma ([place])    colon ca dx 2001;   Diabetes mellitus without complication ([place])    Hypertension    Muscle weakness-general 01/05/2012   Past Surgical History:  Procedure Laterality Date   CHOLECYSTECTOMY     COLON SURGERY     sigmoid colectomy, appendectomy   INCISIONAL HERNIA REPAIR N/A 11/27/2017   Procedure: LAPAROSCOPIC ASSISTED INCISIONAL HERNIA;  Surgeon: Clovis Riley, MD;  [facility];  Service: General;  Laterality: N/A;   INSERTION OF MESH N/A 11/27/2017   Procedure: INSERTION OF MESH;  Surgeon: Clovis Riley, MD;  [facility];  Service: General;  Laterality: N/A;   LAPAROSCOPIC LYSIS OF ADHESIONS N/A 11/27/2017   Procedure: LAPAROSCOPIC LYSIS OF ADHESIONS;  Surgeon: Clovis Riley, MD;  [facility];  Service: General;  Laterality: N/A;   LIVER SURGERY     partial removal duke in 2003   PORT-A-CATH REMOVAL Right 07/16/2012   Procedure: REMOVAL PORT-A-CATH;  Surgeon: Haywood Lasso, MD;  Location: [place];  Service: General;  Laterality: Right;   RIGHT/LEFT HEART CATH AND CORONARY ANGIOGRAPHY N/A 04/01/2021   Procedure: RIGHT/LEFT HEART CATH AND CORONARY ANGIOGRAPHY;   Surgeon: Jolaine Artist, MD;  Location: [place] [facility];  Service: Cardiovascular;  Laterality: N/A;   Patient Active Problem List   Diagnosis Date Noted   Chronic combined systolic and diastolic CHF (congestive heart failure) ([place]) 12/30/2021   Atrial fibrillation, chronic ([place]) 12/30/2021   Mixed hyperlipidemia 12/30/2021   Obesity (BMI 30-39.9) 12/30/2021   Pulmonary embolism ([place]) 12/29/2021   Physical deconditioning 41/74/0814   Acute systolic heart failure ([place]) 04/05/2021   Pulmonary edema 48/18/5631   Acute metabolic encephalopathy 49/70/2637   CKD (chronic kidney disease) stage 3, GFR 30-59 ml/min (HCC) 04/05/2021   Atrial arrhythmia 04/05/2021   Respiratory distress 03/29/2021   Heart failure, type unknown ([place])    Type 2 diabetes mellitus without complication ([place])    Incisional hernia 11/27/2017   Colon cancer metastasized to liver ([place]) 01/05/2012   FLANK PAIN, RIGHT 01/21/2008   TOBACCO ABUSE 12/26/2007   HYPERGLYCEMIA 12/26/2007   PERS HX NONCOMPLIANCE W/MED TX PRS HAZARDS HLTH 12/26/2007   CARCINOMA, COLON 12/17/2007   Essential hypertension 12/17/2007   Respiratory failure, acute ([place]) 12/17/2007    REFERRING DIAG: R53.81 (ICD-10-CM) - Physical deconditioning  THERAPY DIAG:  Other low back pain  Muscle weakness (generalized)  Other abnormalities of gait and mobility  Rationale for Evaluation and Treatment rehabilitation  PERTINENT HISTORY: Saddle pulmonary embolii, Heart  failure, atrial arrhythmia, CHF, Afib, Respiratory failure, CHOLECYSTECTOMY-2019, COLON SURGERY-Cancer 2013, INCISIONAL HERNIA REPAIR, DM, low back pain, R groin pain, R flank pain, obesity  PRECAUTIONS: Other: Cardiopulmonary  SUBJECTIVE:                                                                                                                                                                                      SUBJECTIVE STATEMENT: Pt presents to PT with severe R LE pain  and discomfort. Pt has been compliant with HEP. Pt is ready to begin PT at this time.    PAIN:  Are you having pain?  Yes: NPRS scale: 10/10 Pain location: R groin Pain description: grabbng Aggravating factors: walking Relieving factors: rest Low Back pain is 4/10 and better after injections   OBJECTIVE: (objective measures completed at initial evaluation unless otherwise dated)  PATIENT SURVEYS:  FOTO: Perceived function   34%, predicted   49%    COGNITION:           Overall cognitive status: Within functional limits for tasks assessed                          SENSATION: WFL   EDEMA:  None tested for the lower legs   POSTURE: flexed trunk    PALPATION: NT   LOWER EXTREMITY ROM:            Limited hip flexion due to pain Active ROM Right eval Left eval  Hip flexion      Hip extension      Hip abduction      Hip adduction      Hip internal rotation      Hip external rotation      Knee flexion      Knee extension      Ankle dorsiflexion      Ankle plantarflexion      Ankle inversion      Ankle eversion       (Blank rows = not tested)   LOWER EXTREMITY MMT:   MMT Right eval Left eval  Hip flexion 3 3  Hip extension 3 3  Hip abduction 3 3  Hip adduction 3+ 3+  Hip internal rotation      Hip external rotation 3 3  Knee flexion 4 4  Knee extension 4 4  Ankle dorsiflexion      Ankle plantarflexion      Ankle inversion      Ankle eversion       (Blank rows = not tested)   LOWER EXTREMITY SPECIAL TESTS:  NT   FUNCTIONAL TESTS:  5 times  sit to stand: 15 secs 2 minute walk test: 172 feet 02/21/22   GAIT: Distance walked: 238f pt estimates his walking tolerance to 3045fAssistive device utilized: None Level of assistance: Complete Independence Comments: Antalgic gait pattern over the R LE    [facility]RE[facility]                                                DATE: 05/01/2022 Therapeutic Exercise: NuStep lvl 5 x 5 min  while taking subjective STS 2x10 w/OH press yellow ball Supine SLR 2x15 each Bridge 2x15 with ball squeeze Lateral walk RTB x 3 laps at counter Standing hip abd/ext 2x10 RTB Seated knee ext 3x10 15# Seated hamstring curl 3x10 35# Step ups 8" 2x10 BIL  [facility] Adult PT Treatment:                                                DATE: 04/27/2022 Therapeutic Exercise: NuStep lvl 5 x 5 min while taking subjective STS x 10 w/OH press yellow ball Supine SLR 2x15 each Bridge 2x15 with ball squeeze Lateral walk GTB x 3 laps at counter Standing hip abd/ext 2x10 GTB Seated knee ext 2x10 15# Seated hamstring curl 2x10 35# Step ups 8" 2x10 BIL  [facility] Adult PT Treatment:                                                DATE: 04/20/2022 Therapeutic Exercise: NuStep lvl 5 x 6 min while taking subjective STS x10 w/OH reach - low table Supine SLR 2x15 each Bridge 2x15 with ball squeeze Supine clamshell GTB 2x15 BIL Standing hip abd/ext 2x10 GTB Seated knee ext 2x10 15# Seated hamstring curl 2x10 35# Step ups 8" 2x10 BIL  [facility] Adult PT Treatment:                                                DATE: 04/19/2022 Therapeutic Exercise: NuStep lvl 5 x 5 min while taking subjective STS x10, x10 w/OH reach - low table Supine SLR 2x15 each Bridge 2x15 with ball squeeze Supine clamshell RTB 2x15 BIL Lateral walk GTB x 4 laps Standing hip abd/ext 2x10 GTB Seated knee ext 2x10 15# Seated hamstring curl 2x10 35# Step ups 6" x10 BIL  [facility] Adult PT Treatment:                                                DATE: 04/12/2022 Therapeutic Exercise: NuStep lvl 5 x 5 min while taking subjective STS x10 - low table Supine SLR 2x15 each Bridge 2x15 Sidelying clamshell RTB 2x15 BIL LAQ 2x10 5# Lateral walk RTB x 3 laps Standing hip abd/ext 2x10 RTB Seated knee ext 2x10 15# Seated hamstring curl 2x10 35#     PATIENT EDUCATION:  Education details: HEP Person educated:  Patient and Caregiver girl  friend Education method: Explanation Education comprehension: verbalized understanding and needs further education   HOME EXERCISE PROGRAM: Access Code: [email or web address] URL: [email or web address] Date: 03/06/2022 Prepared by: Octavio Manns  Exercises - Supine Lower Trunk Rotation  - 1 x daily - 7 x weekly - 2 sets - 10 reps - Supine Bridge  - 1 x daily - 7 x weekly - 2 sets - 10 reps - Active Straight Leg Raise with Quad Set  - 1 x daily - 7 x weekly - 2 sets - 10 reps - Hooklying Clamshell with Resistance  - 1 x daily - 7 x weekly - 2 sets - 15 reps - blue theraband hold - Standing Hip Abduction with Counter Support  - 1 x daily - 7 x weekly - 2 sets - 10 reps - Standing Hip Extension with Counter Support  - 1 x daily - 7 x weekly - 2 sets - 10 reps   ASSESSMENT:   CLINICAL IMPRESSION: Pt was able to complete all prescribed exercises with no adverse effect or increase in pain. Therapy focused on improving LE strength to decrease pain and improve functional mobility. Pt continues to benefit from skilled services, will progress as able per POC.      OBJECTIVE IMPAIRMENTS Abnormal gait, cardiopulmonary status limiting activity, decreased activity tolerance, decreased balance, decreased knowledge of condition, difficulty walking, decreased ROM, decreased strength, postural dysfunction, obesity, and pain.    ACTIVITY LIMITATIONS carrying, lifting, bending, standing, squatting, stairs, and locomotion level   PARTICIPATION LIMITATIONS: meal prep, cleaning, laundry, shopping, and community activity   PERSONAL FACTORS Fitness, Past/current experiences, Time since onset of injury/illness/exacerbation, and 3+ comorbidities:    Saddle pulmonary embolii, Heart failure, atrial arrhythmia, CHF, Afib, Respiratory failure, CHOLECYSTECTOMY-2019, COLON SURGERY-Cancer 2013, INCISIONAL HERNIA REPAIR, DM, low back pain, R groin pain, R flank pain, obesityare also affecting patient's functional  outcome.      GOALS:   SHORT TERM GOALS: Target date: 02/24/2022  Pt will be Ind in an initial HEP Baseline: Not initiated Goal status: MET   LONG TERM GOALS: Target date: 05/04/2022    Pt will be Ind in a final HEP to maintain achieved LOF Baseline: Not initiated Goal status: ONGOING   2.  Increase hip strength to 4/5 and knee strength to 5/5 for improved functional ability and safety Baseline: see flow sheets Goal status: ONGOING   3.  Improve 5xSTS by MCID of 5" and 2MWT by MCID of 27f as indication of improved functional mobility Baseline: 2 MWT 172 feet 02/21/22, 5 x STS TBA 03/30/2022: 15 seconds  Goal status: ONGOING   4.  Pt's FOTO score will improved to the predicted value of 49% as indication of improved function Baseline: 34% Goal status: ONGOING   PLAN: PT FREQUENCY: 2x/week   PT DURATION: 6 weeks   PLANNED INTERVENTIONS: Therapeutic exercises, Therapeutic activity, Balance training, Gait training, Patient/Family education, Self Care, Joint mobilization, Cryotherapy, Moist heat, Taping, Manual therapy, and Re-evaluation   PLAN FOR NEXT SESSION: Review FOTO; initiate HEP; use of modalities, manual therapy  DWard ChattersPT  05/02/22 7:46 AM

## 2022-05-03 ENCOUNTER — Ambulatory Visit: Payer: 59

## 2022-05-03 DIAGNOSIS — M5459 Other low back pain: Secondary | ICD-10-CM

## 2022-05-03 DIAGNOSIS — M6281 Muscle weakness (generalized): Secondary | ICD-10-CM

## 2022-05-03 NOTE — Therapy (Signed)
OUTPATIENT PHYSICAL THERAPY TREATMENT NOTE    Patient Name: Raymond Allen MRN: [id number] DOB:04/03/1961, 62 y.o., male Today's Date: 05/03/2022  PCP: [place], Julie M, NP   REFERRING PROVIDER: Jolaine Artist, MD  END OF SESSION:   PT End of Session - 05/03/22 1619     Visit Number 19    Number of Visits 21    Date for PT Re-Evaluation 05/04/22    Authorization Type [employer] MEDICARE    Progress Note Due on Visit 10    PT Start Time 1619    PT Stop Time 1702    PT Time Calculation (min) 43 min    Activity Tolerance Patient tolerated treatment well    Behavior During Therapy [facility] for tasks assessed/performed                Past Medical History:  Diagnosis Date   Cancer ([place])    Colon cancer metastasized to liver ([place]) 01/05/2012   Colon carcinoma ([place])    colon ca dx 2001;   Diabetes mellitus without complication ([place])    Hypertension    Muscle weakness-general 01/05/2012   Past Surgical History:  Procedure Laterality Date   CHOLECYSTECTOMY     COLON SURGERY     sigmoid colectomy, appendectomy   INCISIONAL HERNIA REPAIR N/A 11/27/2017   Procedure: LAPAROSCOPIC ASSISTED INCISIONAL HERNIA;  Surgeon: Clovis Riley, MD;  [facility];  Service: General;  Laterality: N/A;   INSERTION OF MESH N/A 11/27/2017   Procedure: INSERTION OF MESH;  Surgeon: Clovis Riley, MD;  [facility];  Service: General;  Laterality: N/A;   LAPAROSCOPIC LYSIS OF ADHESIONS N/A 11/27/2017   Procedure: LAPAROSCOPIC LYSIS OF ADHESIONS;  Surgeon: Clovis Riley, MD;  [facility];  Service: General;  Laterality: N/A;   LIVER SURGERY     partial removal duke in 2003   PORT-A-CATH REMOVAL Right 07/16/2012   Procedure: REMOVAL PORT-A-CATH;  Surgeon: Haywood Lasso, MD;  Location: [place];  Service: General;  Laterality: Right;   RIGHT/LEFT HEART CATH AND CORONARY ANGIOGRAPHY N/A 04/01/2021   Procedure: RIGHT/LEFT HEART CATH AND CORONARY ANGIOGRAPHY;   Surgeon: Jolaine Artist, MD;  Location: [place] [facility];  Service: Cardiovascular;  Laterality: N/A;   Patient Active Problem List   Diagnosis Date Noted   Chronic combined systolic and diastolic CHF (congestive heart failure) ([place]) 12/30/2021   Atrial fibrillation, chronic ([place]) 12/30/2021   Mixed hyperlipidemia 12/30/2021   Obesity (BMI 30-39.9) 12/30/2021   Pulmonary embolism ([place]) 12/29/2021   Physical deconditioning 62/13/0865   Acute systolic heart failure ([place]) 04/05/2021   Pulmonary edema 78/46/9629   Acute metabolic encephalopathy 52/84/1324   CKD (chronic kidney disease) stage 3, GFR 30-59 ml/min (HCC) 04/05/2021   Atrial arrhythmia 04/05/2021   Respiratory distress 03/29/2021   Heart failure, type unknown ([place])    Type 2 diabetes mellitus without complication ([place])    Incisional hernia 11/27/2017   Colon cancer metastasized to liver ([place]) 01/05/2012   FLANK PAIN, RIGHT 01/21/2008   TOBACCO ABUSE 12/26/2007   HYPERGLYCEMIA 12/26/2007   PERS HX NONCOMPLIANCE W/MED TX PRS HAZARDS HLTH 12/26/2007   CARCINOMA, COLON 12/17/2007   Essential hypertension 12/17/2007   Respiratory failure, acute ([place]) 12/17/2007    REFERRING DIAG: R53.81 (ICD-10-CM) - Physical deconditioning  THERAPY DIAG:  Other low back pain  Muscle weakness (generalized)  Rationale for Evaluation and Treatment rehabilitation  PERTINENT HISTORY: Saddle pulmonary embolii, Heart failure, atrial arrhythmia, CHF, Afib, Respiratory  failure, CHOLECYSTECTOMY-2019, COLON SURGERY-Cancer 2013, INCISIONAL HERNIA REPAIR, DM, low back pain, R groin pain, R flank pain, obesity  PRECAUTIONS: Other: Cardiopulmonary  SUBJECTIVE:                                                                                                                                                                                      SUBJECTIVE STATEMENT: Pt presents PT with reports of continued pain and discomfort. He also is slightly  ShoB at start of therapy. Ready to begin PT at this time.    PAIN:  Are you having pain?  Yes: NPRS scale: 10/10 Pain location: R groin Pain description: grabbng Aggravating factors: walking Relieving factors: rest Low Back pain is 4/10 and better after injections   OBJECTIVE: (objective measures completed at initial evaluation unless otherwise dated)  PATIENT SURVEYS:  FOTO: Perceived function   34%, predicted   49%    COGNITION:           Overall cognitive status: Within functional limits for tasks assessed                          SENSATION: WFL   EDEMA:  None tested for the lower legs   POSTURE: flexed trunk    PALPATION: NT   LOWER EXTREMITY ROM:            Limited hip flexion due to pain Active ROM Right eval Left eval  Hip flexion      Hip extension      Hip abduction      Hip adduction      Hip internal rotation      Hip external rotation      Knee flexion      Knee extension      Ankle dorsiflexion      Ankle plantarflexion      Ankle inversion      Ankle eversion       (Blank rows = not tested)   LOWER EXTREMITY MMT:   MMT Right eval Left eval  Hip flexion 3 3  Hip extension 3 3  Hip abduction 3 3  Hip adduction 3+ 3+  Hip internal rotation      Hip external rotation 3 3  Knee flexion 4 4  Knee extension 4 4  Ankle dorsiflexion      Ankle plantarflexion      Ankle inversion      Ankle eversion       (Blank rows = not tested)   LOWER EXTREMITY SPECIAL TESTS:  NT   FUNCTIONAL TESTS:  5 times sit to stand: 15 secs 2  minute walk test: 172 feet 02/21/22   GAIT: Distance walked: 232f pt estimates his walking tolerance to 3033fAssistive device utilized: None Level of assistance: Complete Independence Comments: Antalgic gait pattern over the R LE    [facility]                                                DATE: 05/03/2022 Therapeutic Exercise: NuStep lvl 5 x 5 min while taking subjective STS  2x10 w/OH press yellow ball Supine SLR 2x15 each Bridge 2x15 with ball squeeze Supine clamshell 3x15 black TB Seated knee ext 3x10 15# Seated hamstring curl 3x10 35# Standing hip abd 3x10 17.5# Step ups 10" x 10 BIL  [facility] Adult PT Treatment:                                                DATE: 05/01/2022 Therapeutic Exercise: NuStep lvl 5 x 5 min while taking subjective STS 2x10 w/OH press yellow ball Supine SLR 2x15 each Bridge 2x15 with ball squeeze Lateral walk RTB x 3 laps at counter Standing hip abd/ext 2x10 RTB Seated knee ext 3x10 15# Seated hamstring curl 3x10 35# Step ups 8" 2x10 BIL  [facility] Adult PT Treatment:                                                DATE: 04/27/2022 Therapeutic Exercise: NuStep lvl 5 x 5 min while taking subjective STS x 10 w/OH press yellow ball Supine SLR 2x15 each Bridge 2x15 with ball squeeze Lateral walk GTB x 3 laps at counter Standing hip abd/ext 2x10 GTB Seated knee ext 2x10 15# Seated hamstring curl 2x10 35# Step ups 8" 2x10 BIL  [facility] Adult PT Treatment:                                                DATE: 04/20/2022 Therapeutic Exercise: NuStep lvl 5 x 6 min while taking subjective STS x10 w/OH reach - low table Supine SLR 2x15 each Bridge 2x15 with ball squeeze Supine clamshell GTB 2x15 BIL Standing hip abd/ext 2x10 GTB Seated knee ext 2x10 15# Seated hamstring curl 2x10 35# Step ups 8" 2x10 BIL  [facility] Adult PT Treatment:                                                DATE: 04/19/2022 Therapeutic Exercise: NuStep lvl 5 x 5 min while taking subjective STS x10, x10 w/OH reach - low table Supine SLR 2x15 each Bridge 2x15 with ball squeeze Supine clamshell RTB 2x15 BIL Lateral walk GTB x 4 laps Standing hip abd/ext 2x10 GTB Seated knee ext 2x10 15# Seated hamstring curl 2x10 35# Step ups 6" x10 BIL  [facility] Adult PT Treatment:  DATE: 04/12/2022 Therapeutic Exercise: NuStep lvl 5 x 5  min while taking subjective STS x10 - low table Supine SLR 2x15 each Bridge 2x15 Sidelying clamshell RTB 2x15 BIL LAQ 2x10 5# Lateral walk RTB x 3 laps Standing hip abd/ext 2x10 RTB Seated knee ext 2x10 15# Seated hamstring curl 2x10 35#     PATIENT EDUCATION:  Education details: HEP Person educated: Patient and Caregiver girl friend Education method: Explanation Education comprehension: verbalized understanding and needs further education   HOME EXERCISE PROGRAM: Access Code: [email or web address] URL: [email or web address] Date: 03/06/2022 Prepared by: Octavio Manns  Exercises - Supine Lower Trunk Rotation  - 1 x daily - 7 x weekly - 2 sets - 10 reps - Supine Bridge  - 1 x daily - 7 x weekly - 2 sets - 10 reps - Active Straight Leg Raise with Quad Set  - 1 x daily - 7 x weekly - 2 sets - 10 reps - Hooklying Clamshell with Resistance  - 1 x daily - 7 x weekly - 2 sets - 15 reps - blue theraband hold - Standing Hip Abduction with Counter Support  - 1 x daily - 7 x weekly - 2 sets - 10 reps - Standing Hip Extension with Counter Support  - 1 x daily - 7 x weekly - 2 sets - 10 reps   ASSESSMENT:   CLINICAL IMPRESSION: Pt was again able to complete all prescribed exercises with no adverse effect or increase in pain. Once again tolerated treatment better today, showing improving activity tolerance and decreased pain during therapeutic exercise. Pt continues to benefit from skilled PT services, will continue to be seen and progressed as able per POC.    OBJECTIVE IMPAIRMENTS Abnormal gait, cardiopulmonary status limiting activity, decreased activity tolerance, decreased balance, decreased knowledge of condition, difficulty walking, decreased ROM, decreased strength, postural dysfunction, obesity, and pain.    ACTIVITY LIMITATIONS carrying, lifting, bending, standing, squatting, stairs, and locomotion level   PARTICIPATION LIMITATIONS: meal prep, cleaning, laundry, shopping, and  community activity   PERSONAL FACTORS Fitness, Past/current experiences, Time since onset of injury/illness/exacerbation, and 3+ comorbidities:    Saddle pulmonary embolii, Heart failure, atrial arrhythmia, CHF, Afib, Respiratory failure, CHOLECYSTECTOMY-2019, COLON SURGERY-Cancer 2013, INCISIONAL HERNIA REPAIR, DM, low back pain, R groin pain, R flank pain, obesityare also affecting patient's functional outcome.      GOALS:   SHORT TERM GOALS: Target date: 02/24/2022  Pt will be Ind in an initial HEP Baseline: Not initiated Goal status: MET   LONG TERM GOALS: Target date: 05/04/2022    Pt will be Ind in a final HEP to maintain achieved LOF Baseline: Not initiated Goal status: ONGOING   2.  Increase hip strength to 4/5 and knee strength to 5/5 for improved functional ability and safety Baseline: see flow sheets Goal status: ONGOING   3.  Improve 5xSTS by MCID of 5" and 2MWT by MCID of 8f as indication of improved functional mobility Baseline: 2 MWT 172 feet 02/21/22, 5 x STS TBA 03/30/2022: 15 seconds  Goal status: ONGOING   4.  Pt's FOTO score will improved to the predicted value of 49% as indication of improved function Baseline: 34% Goal status: ONGOING   PLAN: PT FREQUENCY: 2x/week   PT DURATION: 6 weeks   PLANNED INTERVENTIONS: Therapeutic exercises, Therapeutic activity, Balance training, Gait training, Patient/Family education, Self Care, Joint mobilization, Cryotherapy, Moist heat, Taping, Manual therapy, and Re-evaluation   PLAN FOR NEXT SESSION: Review FOTO; initiate HEP; use of modalities, manual therapy  Ward Chatters PT  05/03/22 5:09 PM

## 2022-05-08 ENCOUNTER — Ambulatory Visit: Payer: 59

## 2022-05-08 DIAGNOSIS — M5459 Other low back pain: Secondary | ICD-10-CM | POA: Diagnosis not present

## 2022-05-08 DIAGNOSIS — M6281 Muscle weakness (generalized): Secondary | ICD-10-CM

## 2022-05-08 NOTE — Therapy (Signed)
OUTPATIENT PHYSICAL THERAPY TREATMENT NOTE    Patient Name: Raymond Allen MRN: [id number] DOB:1960/07/16, 62 y.o., male Today's Date: 05/09/2022  PCP: [place], Julie M, NP   REFERRING PROVIDER: Jolaine Artist, MD  END OF SESSION:   PT End of Session - 05/08/22 1615     Visit Number 20    Number of Visits 21    Date for PT Re-Evaluation 05/04/22    Authorization Type [employer] MEDICARE    Progress Note Due on Visit 10    PT Start Time 1615    PT Stop Time 1941    PT Time Calculation (min) 38 min    Activity Tolerance Patient tolerated treatment well    Behavior During Therapy [facility] for tasks assessed/performed                 Past Medical History:  Diagnosis Date   Cancer ([place])    Colon cancer metastasized to liver ([place]) 01/05/2012   Colon carcinoma ([place])    colon ca dx 2001;   Diabetes mellitus without complication ([place])    Hypertension    Muscle weakness-general 01/05/2012   Past Surgical History:  Procedure Laterality Date   CHOLECYSTECTOMY     COLON SURGERY     sigmoid colectomy, appendectomy   INCISIONAL HERNIA REPAIR N/A 11/27/2017   Procedure: LAPAROSCOPIC ASSISTED INCISIONAL HERNIA;  Surgeon: Clovis Riley, MD;  [facility];  Service: General;  Laterality: N/A;   INSERTION OF MESH N/A 11/27/2017   Procedure: INSERTION OF MESH;  Surgeon: Clovis Riley, MD;  [facility];  Service: General;  Laterality: N/A;   LAPAROSCOPIC LYSIS OF ADHESIONS N/A 11/27/2017   Procedure: LAPAROSCOPIC LYSIS OF ADHESIONS;  Surgeon: Clovis Riley, MD;  [facility];  Service: General;  Laterality: N/A;   LIVER SURGERY     partial removal duke in 2003   PORT-A-CATH REMOVAL Right 07/16/2012   Procedure: REMOVAL PORT-A-CATH;  Surgeon: Haywood Lasso, MD;  Location: [place];  Service: General;  Laterality: Right;   RIGHT/LEFT HEART CATH AND CORONARY ANGIOGRAPHY N/A 04/01/2021   Procedure: RIGHT/LEFT HEART CATH AND CORONARY ANGIOGRAPHY;   Surgeon: Jolaine Artist, MD;  Location: [place] [facility];  Service: Cardiovascular;  Laterality: N/A;   Patient Active Problem List   Diagnosis Date Noted   Chronic combined systolic and diastolic CHF (congestive heart failure) ([place]) 12/30/2021   Atrial fibrillation, chronic ([place]) 12/30/2021   Mixed hyperlipidemia 12/30/2021   Obesity (BMI 30-39.9) 12/30/2021   Pulmonary embolism ([place]) 12/29/2021   Physical deconditioning 74/11/1446   Acute systolic heart failure ([place]) 04/05/2021   Pulmonary edema 18/56/3149   Acute metabolic encephalopathy 70/26/3785   CKD (chronic kidney disease) stage 3, GFR 30-59 ml/min (HCC) 04/05/2021   Atrial arrhythmia 04/05/2021   Respiratory distress 03/29/2021   Heart failure, type unknown ([place])    Type 2 diabetes mellitus without complication ([place])    Incisional hernia 11/27/2017   Colon cancer metastasized to liver ([place]) 01/05/2012   FLANK PAIN, RIGHT 01/21/2008   TOBACCO ABUSE 12/26/2007   HYPERGLYCEMIA 12/26/2007   PERS HX NONCOMPLIANCE W/MED TX PRS HAZARDS HLTH 12/26/2007   CARCINOMA, COLON 12/17/2007   Essential hypertension 12/17/2007   Respiratory failure, acute ([place]) 12/17/2007    REFERRING DIAG: R53.81 (ICD-10-CM) - Physical deconditioning  THERAPY DIAG:  Other low back pain  Muscle weakness (generalized)  Rationale for Evaluation and Treatment rehabilitation  PERTINENT HISTORY: Saddle pulmonary embolii, Heart failure, atrial arrhythmia, CHF, Afib,  Respiratory failure, CHOLECYSTECTOMY-2019, COLON SURGERY-Cancer 2013, INCISIONAL HERNIA REPAIR, DM, low back pain, R groin pain, R flank pain, obesity  PRECAUTIONS: Other: Cardiopulmonary  SUBJECTIVE:                                                                                                                                                                                      SUBJECTIVE STATEMENT: Pt presents to PT with reports of continued pain. Has been fairly compliant with  HEP. Pt is ready to begin PT at this time.    PAIN:  Are you having pain?  Yes: NPRS scale: 10/10 Pain location: R groin Pain description: grabbng Aggravating factors: walking Relieving factors: rest Low Back pain is 4/10 and better after injections   OBJECTIVE: (objective measures completed at initial evaluation unless otherwise dated)  PATIENT SURVEYS:  FOTO: Perceived function   34%, predicted   49%    COGNITION:           Overall cognitive status: Within functional limits for tasks assessed                          SENSATION: WFL   EDEMA:  None tested for the lower legs   POSTURE: flexed trunk    PALPATION: NT   LOWER EXTREMITY ROM:            Limited hip flexion due to pain Active ROM Right eval Left eval  Hip flexion      Hip extension      Hip abduction      Hip adduction      Hip internal rotation      Hip external rotation      Knee flexion      Knee extension      Ankle dorsiflexion      Ankle plantarflexion      Ankle inversion      Ankle eversion       (Blank rows = not tested)   LOWER EXTREMITY MMT:   MMT Right eval Left eval  Hip flexion 3 3  Hip extension 3 3  Hip abduction 3 3  Hip adduction 3+ 3+  Hip internal rotation      Hip external rotation 3 3  Knee flexion 4 4  Knee extension 4 4  Ankle dorsiflexion      Ankle plantarflexion      Ankle inversion      Ankle eversion       (Blank rows = not tested)   LOWER EXTREMITY SPECIAL TESTS:  NT   FUNCTIONAL TESTS:  5 times sit to stand: 15 secs 2 minute  walk test: 172 feet 02/21/22   GAIT: Distance walked: 248f pt estimates his walking tolerance to 3033fAssistive device utilized: None Level of assistance: Complete Independence Comments: Antalgic gait pattern over the R LE    [facility]                                                DATE: 05/08/2022 Therapeutic Exercise: NuStep lvl 6 x 5 min while taking subjective Standing mini squat  x 10  STS 2x10 w/OH press yellow ball Supine SLR 2x15 each Bridge 2x15 with ball squeeze Supine clamshell 3x15 black TB Seated knee ext 3x10 15# Seated hamstring curl 3x10 35# Standing hip abd 3x10 25# Step ups 10" x 10 BIL  [facility] Adult PT Treatment:                                                DATE: 05/03/2022 Therapeutic Exercise: NuStep lvl 5 x 5 min while taking subjective STS 2x10 w/OH press yellow ball Supine SLR 2x15 each Bridge 2x15 with ball squeeze Supine clamshell 3x15 black TB Seated knee ext 3x10 15# Seated hamstring curl 3x10 35# Standing hip abd 3x10 17.5# Step ups 10" x 10 BIL  [facility] Adult PT Treatment:                                                DATE: 05/01/2022 Therapeutic Exercise: NuStep lvl 5 x 5 min while taking subjective STS 2x10 w/OH press yellow ball Supine SLR 2x15 each Bridge 2x15 with ball squeeze Lateral walk RTB x 3 laps at counter Standing hip abd/ext 2x10 RTB Seated knee ext 3x10 15# Seated hamstring curl 3x10 35# Step ups 8" 2x10 BIL  [facility] Adult PT Treatment:                                                DATE: 04/27/2022 Therapeutic Exercise: NuStep lvl 5 x 5 min while taking subjective STS x 10 w/OH press yellow ball Supine SLR 2x15 each Bridge 2x15 with ball squeeze Lateral walk GTB x 3 laps at counter Standing hip abd/ext 2x10 GTB Seated knee ext 2x10 15# Seated hamstring curl 2x10 35# Step ups 8" 2x10 BIL  [facility] Adult PT Treatment:                                                DATE: 04/20/2022 Therapeutic Exercise: NuStep lvl 5 x 6 min while taking subjective STS x10 w/OH reach - low table Supine SLR 2x15 each Bridge 2x15 with ball squeeze Supine clamshell GTB 2x15 BIL Standing hip abd/ext 2x10 GTB Seated knee ext 2x10 15# Seated hamstring curl 2x10 35# Step ups 8" 2x10 BIL  [facility] Adult PT Treatment:  DATE: 04/19/2022 Therapeutic Exercise: NuStep lvl 5 x 5 min while taking  subjective STS x10, x10 w/OH reach - low table Supine SLR 2x15 each Bridge 2x15 with ball squeeze Supine clamshell RTB 2x15 BIL Lateral walk GTB x 4 laps Standing hip abd/ext 2x10 GTB Seated knee ext 2x10 15# Seated hamstring curl 2x10 35# Step ups 6" x10 BIL  [facility] Adult PT Treatment:                                                DATE: 04/12/2022 Therapeutic Exercise: NuStep lvl 5 x 5 min while taking subjective STS x10 - low table Supine SLR 2x15 each Bridge 2x15 Sidelying clamshell RTB 2x15 BIL LAQ 2x10 5# Lateral walk RTB x 3 laps Standing hip abd/ext 2x10 RTB Seated knee ext 2x10 15# Seated hamstring curl 2x10 35#     PATIENT EDUCATION:  Education details: HEP Person educated: Patient and Caregiver girl friend Education method: Explanation Education comprehension: verbalized understanding and needs further education   HOME EXERCISE PROGRAM: Access Code: [email or web address] URL: [email or web address] Date: 03/06/2022 Prepared by: Octavio Manns  Exercises - Supine Lower Trunk Rotation  - 1 x daily - 7 x weekly - 2 sets - 10 reps - Supine Bridge  - 1 x daily - 7 x weekly - 2 sets - 10 reps - Active Straight Leg Raise with Quad Set  - 1 x daily - 7 x weekly - 2 sets - 10 reps - Hooklying Clamshell with Resistance  - 1 x daily - 7 x weekly - 2 sets - 15 reps - blue theraband hold - Standing Hip Abduction with Counter Support  - 1 x daily - 7 x weekly - 2 sets - 10 reps - Standing Hip Extension with Counter Support  - 1 x daily - 7 x weekly - 2 sets - 10 reps   ASSESSMENT:   CLINICAL IMPRESSION: Pt was again able to complete all prescribed exercises with no adverse effect or increase in pain. Therapy focused on improving proximal hip and general LE strength/activity tolerance. PT will assess goals at next session and determine appropriate POC.     OBJECTIVE IMPAIRMENTS Abnormal gait, cardiopulmonary status limiting activity, decreased activity tolerance, decreased  balance, decreased knowledge of condition, difficulty walking, decreased ROM, decreased strength, postural dysfunction, obesity, and pain.    ACTIVITY LIMITATIONS carrying, lifting, bending, standing, squatting, stairs, and locomotion level   PARTICIPATION LIMITATIONS: meal prep, cleaning, laundry, shopping, and community activity   PERSONAL FACTORS Fitness, Past/current experiences, Time since onset of injury/illness/exacerbation, and 3+ comorbidities:    Saddle pulmonary embolii, Heart failure, atrial arrhythmia, CHF, Afib, Respiratory failure, CHOLECYSTECTOMY-2019, COLON SURGERY-Cancer 2013, INCISIONAL HERNIA REPAIR, DM, low back pain, R groin pain, R flank pain, obesityare also affecting patient's functional outcome.      GOALS:   SHORT TERM GOALS: Target date: 02/24/2022  Pt will be Ind in an initial HEP Baseline: Not initiated Goal status: MET   LONG TERM GOALS: Target date: 05/04/2022    Pt will be Ind in a final HEP to maintain achieved LOF Baseline: Not initiated Goal status: ONGOING   2.  Increase hip strength to 4/5 and knee strength to 5/5 for improved functional ability and safety Baseline: see flow sheets Goal status: ONGOING   3.  Improve 5xSTS by MCID of 5" and 2MWT by MCID of 49f  as indication of improved functional mobility Baseline: 2 MWT 172 feet 02/21/22, 5 x STS TBA 03/30/2022: 15 seconds  Goal status: ONGOING   4.  Pt's FOTO score will improved to the predicted value of 49% as indication of improved function Baseline: 34% Goal status: ONGOING   PLAN: PT FREQUENCY: 2x/week   PT DURATION: 6 weeks   PLANNED INTERVENTIONS: Therapeutic exercises, Therapeutic activity, Balance training, Gait training, Patient/Family education, Self Care, Joint mobilization, Cryotherapy, Moist heat, Taping, Manual therapy, and Re-evaluation   PLAN FOR NEXT SESSION: Review FOTO; initiate HEP; use of modalities, manual therapy  Ward Chatters PT  05/09/22 7:36 AM

## 2022-05-10 ENCOUNTER — Ambulatory Visit: Payer: 59

## 2022-05-10 DIAGNOSIS — M5459 Other low back pain: Secondary | ICD-10-CM

## 2022-05-10 DIAGNOSIS — R2689 Other abnormalities of gait and mobility: Secondary | ICD-10-CM

## 2022-05-10 DIAGNOSIS — M6281 Muscle weakness (generalized): Secondary | ICD-10-CM

## 2022-05-10 NOTE — Therapy (Signed)
OUTPATIENT PHYSICAL THERAPY TREATMENT NOTE/DISCHARGE  PHYSICAL THERAPY DISCHARGE SUMMARY  Visits from Start of Care: 21  Current functional level related to goals / functional outcomes: See goals and objective   Remaining deficits: See goals and objective   Education / Equipment: HEP   Patient agrees to discharge. Patient goals were partially met. Patient is being discharged due to maximized rehab potential.     Patient Name: Raymond Allen MRN: [id number] DOB:04-May-1960, 63 y.o., male Today's Date: 05/10/2022  PCP: [place], Julie M, NP   REFERRING PROVIDER: Jolaine Artist, MD  END OF SESSION:   PT End of Session - 05/10/22 1618     Visit Number 21    Number of Visits 21    Date for PT Re-Evaluation 05/04/22    Authorization Type [employer] MEDICARE    Progress Note Due on Visit 10    PT Start Time 1618    PT Stop Time 1700    PT Time Calculation (min) 42 min    Activity Tolerance Patient tolerated treatment well    Behavior During Therapy [facility] for tasks assessed/performed                 Past Medical History:  Diagnosis Date   Cancer ([place])    Colon cancer metastasized to liver ([place]) 01/05/2012   Colon carcinoma ([place])    colon ca dx 2001;   Diabetes mellitus without complication ([place])    Hypertension    Muscle weakness-general 01/05/2012   Past Surgical History:  Procedure Laterality Date   CHOLECYSTECTOMY     COLON SURGERY     sigmoid colectomy, appendectomy   INCISIONAL HERNIA REPAIR N/A 11/27/2017   Procedure: LAPAROSCOPIC ASSISTED INCISIONAL HERNIA;  Surgeon: Clovis Riley, MD;  Location: [facility];  Service: General;  Laterality: N/A;   INSERTION OF MESH N/A 11/27/2017   Procedure: INSERTION OF MESH;  Surgeon: Clovis Riley, MD;  [facility];  Service: General;  Laterality: N/A;   LAPAROSCOPIC LYSIS OF ADHESIONS N/A 11/27/2017   Procedure: LAPAROSCOPIC LYSIS OF ADHESIONS;  Surgeon: Clovis Riley, MD;  [facility];  Service:  General;  Laterality: N/A;   LIVER SURGERY     partial removal duke in 2003   PORT-A-CATH REMOVAL Right 07/16/2012   Procedure: REMOVAL PORT-A-CATH;  Surgeon: Haywood Lasso, MD;  Location: [place];  Service: General;  Laterality: Right;   RIGHT/LEFT HEART CATH AND CORONARY ANGIOGRAPHY N/A 04/01/2021   Procedure: RIGHT/LEFT HEART CATH AND CORONARY ANGIOGRAPHY;  Surgeon: Jolaine Artist, MD;  Location: [place] [facility];  Service: Cardiovascular;  Laterality: N/A;   Patient Active Problem List   Diagnosis Date Noted   Chronic combined systolic and diastolic CHF (congestive heart failure) ([place]) 12/30/2021   Atrial fibrillation, chronic ([place]) 12/30/2021   Mixed hyperlipidemia 12/30/2021   Obesity (BMI 30-39.9) 12/30/2021   Pulmonary embolism ([place]) 12/29/2021   Physical deconditioning 28/31/5176   Acute systolic heart failure ([place]) 04/05/2021   Pulmonary edema 16/10/3708   Acute metabolic encephalopathy 62/69/4854   CKD (chronic kidney disease) stage 3, GFR 30-59 ml/min (HCC) 04/05/2021   Atrial arrhythmia 04/05/2021   Respiratory distress 03/29/2021   Heart failure, type unknown ([place])    Type 2 diabetes mellitus without complication ([place])    Incisional hernia 11/27/2017   Colon cancer metastasized to liver ([place]) 01/05/2012   FLANK PAIN, RIGHT 01/21/2008   TOBACCO ABUSE 12/26/2007   HYPERGLYCEMIA 12/26/2007   PERS HX NONCOMPLIANCE W/MED TX PRS  HAZARDS HLTH 12/26/2007   CARCINOMA, COLON 12/17/2007   Essential hypertension 12/17/2007   Respiratory failure, acute ([place]) 12/17/2007    REFERRING DIAG: R53.81 (ICD-10-CM) - Physical deconditioning  THERAPY DIAG:  Other low back pain  Muscle weakness (generalized)  Other abnormalities of gait and mobility  Rationale for Evaluation and Treatment rehabilitation  PERTINENT HISTORY: Saddle pulmonary embolii, Heart failure, atrial arrhythmia, CHF, Afib, Respiratory failure, CHOLECYSTECTOMY-2019, COLON  SURGERY-Cancer 2013, INCISIONAL HERNIA REPAIR, DM, low back pain, R groin pain, R flank pain, obesity  PRECAUTIONS: Other: Cardiopulmonary  SUBJECTIVE:                                                                                                                                                                                      SUBJECTIVE STATEMENT: Pt arrives to PT with continued severe groin and back pain. HEP compliant. Feels like he has been getting stronger but has kind of hit a plateau with therapy.   PAIN:  Are you having pain?  Yes: NPRS scale: 10/10 Pain location: R groin Pain description: grabbng Aggravating factors: walking Relieving factors: rest Low Back pain is 4/10 and better after injections   OBJECTIVE: (objective measures completed at initial evaluation unless otherwise dated)  PATIENT SURVEYS:  FOTO: Perceived function   34%, predicted   49% - 05/10/2022   COGNITION:           Overall cognitive status: Within functional limits for tasks assessed                          SENSATION: WFL   EDEMA:  None tested for the lower legs   POSTURE: flexed trunk    PALPATION: NT   LOWER EXTREMITY ROM:            Limited hip flexion due to pain Active ROM Right eval Left eval  Hip flexion      Hip extension      Hip abduction      Hip adduction      Hip internal rotation      Hip external rotation      Knee flexion      Knee extension      Ankle dorsiflexion      Ankle plantarflexion      Ankle inversion      Ankle eversion       (Blank rows = not tested)   LOWER EXTREMITY MMT:   MMT Right eval Left eval Right 05/10/22 Left 05/10/22  Hip flexion '3 3 4 4  '$ Hip extension '3 3 4 4  '$ Hip abduction '3 3 4 4  '$ Hip adduction  3+ 3+ 4 4  Hip internal rotation        Hip external rotation '3 3 4 4  '$ Knee flexion '4 4 5 5  '$ Knee extension '4 4 5 5  '$ Ankle dorsiflexion        Ankle plantarflexion        Ankle inversion        Ankle eversion         (Blank rows  = not tested)   LOWER EXTREMITY SPECIAL TESTS:  NT   FUNCTIONAL TESTS:  5 times sit to stand: 30 secs - 05/10/2022 2 minute walk test: 280fet 05/10/2022   GAIT: Distance walked: 2059fpt estimates his walking tolerance to 30069fssistive device utilized: None Level of assistance: Complete Independence Comments: Antalgic gait pattern over the R LE    [facility]                                                DATE: 05/10/2022 Therapeutic Exercise: Bridge x 15 SLR x 15 each Supine clamshell x 15 BTB Lateral walk x 3 laps GTB Standing hip abd/ext x 10 GTB Therapeutic Activity: Assessment of tests/measures, goals, and outcomes for discharge  [facility]                                                DATE: 05/08/2022 Therapeutic Exercise: NuStep lvl 6 x 5 min while taking subjective Standing mini squat x 10  STS 2x10 w/OH press yellow ball Supine SLR 2x15 each Bridge 2x15 with ball squeeze Supine clamshell 3x15 black TB Seated knee ext 3x10 15# Seated hamstring curl 3x10 35# Standing hip abd 3x10 25# Step ups 10" x 10 BIL  [facility] Adult PT Treatment:                                                DATE: 05/03/2022 Therapeutic Exercise: NuStep lvl 5 x 5 min while taking subjective STS 2x10 w/OH press yellow ball Supine SLR 2x15 each Bridge 2x15 with ball squeeze Supine clamshell 3x15 black TB Seated knee ext 3x10 15# Seated hamstring curl 3x10 35# Standing hip abd 3x10 17.5# Step ups 10" x 10 BIL  [facility] Adult PT Treatment:                                                DATE: 05/01/2022 Therapeutic Exercise: NuStep lvl 5 x 5 min while taking subjective STS 2x10 w/OH press yellow ball Supine SLR 2x15 each Bridge 2x15 with ball squeeze Lateral walk RTB x 3 laps at counter Standing hip abd/ext 2x10 RTB Seated knee ext 3x10 15# Seated hamstring curl 3x10 35# Step ups 8" 2x10 BIL  [facility] Adult PT Treatment:  DATE: 04/27/2022 Therapeutic Exercise: NuStep lvl 5 x 5 min while taking subjective STS x 10 w/OH press yellow ball Supine SLR 2x15 each Bridge 2x15 with ball squeeze Lateral walk GTB x 3 laps at counter Standing hip abd/ext 2x10 GTB Seated knee ext 2x10 15# Seated hamstring curl 2x10 35# Step ups 8" 2x10 BIL  [facility] Adult PT Treatment:                                                DATE: 04/20/2022 Therapeutic Exercise: NuStep lvl 5 x 6 min while taking subjective STS x10 w/OH reach - low table Supine SLR 2x15 each Bridge 2x15 with ball squeeze Supine clamshell GTB 2x15 BIL Standing hip abd/ext 2x10 GTB Seated knee ext 2x10 15# Seated hamstring curl 2x10 35# Step ups 8" 2x10 BIL  [facility] Adult PT Treatment:                                                DATE: 04/19/2022 Therapeutic Exercise: NuStep lvl 5 x 5 min while taking subjective STS x10, x10 w/OH reach - low table Supine SLR 2x15 each Bridge 2x15 with ball squeeze Supine clamshell RTB 2x15 BIL Lateral walk GTB x 4 laps Standing hip abd/ext 2x10 GTB Seated knee ext 2x10 15# Seated hamstring curl 2x10 35# Step ups 6" x10 BIL  [facility] Adult PT Treatment:                                                DATE: 04/12/2022 Therapeutic Exercise: NuStep lvl 5 x 5 min while taking subjective STS x10 - low table Supine SLR 2x15 each Bridge 2x15 Sidelying clamshell RTB 2x15 BIL LAQ 2x10 5# Lateral walk RTB x 3 laps Standing hip abd/ext 2x10 RTB Seated knee ext 2x10 15# Seated hamstring curl 2x10 35#     PATIENT EDUCATION:  Education details: HEP and discharge plan Person educated: Patient and Caregiver girl friend Education method: Explanation Education comprehension: verbalized understanding and needs further education   HOME EXERCISE PROGRAM: Access Code: [email or web address] URL: [email or web address] Date: 05/10/2022 Prepared by: Octavio Manns  Exercises - Supine Bridge  - 3-4 x weekly - 2 sets  - 15 reps - Hooklying Clamshell with Resistance  - 3-4 x weekly - 2 sets - 15 reps - black theraband hold - Active Straight Leg Raise with Quad Set  - 3-4 x weekly - 2 sets - 15 reps - Side Stepping with Resistance at Ankles and Counter Support  - 3-4 x weekly - 3 laps - green band hold - Standing Hip Abduction with Resistance at Ankles and Counter Support  - 1 x daily - 7 x weekly - 3 sets - 10 reps   ASSESSMENT:   CLINICAL IMPRESSION: Pt was able to complete all prescribed exercises and demonstrated knowledge of HEP with no adverse effect. Over the course of PT treatment he has improved strength and functional mobility. Unfortunately PT has not had much change in his severe R groin and LBP. He shows improved gait distance with 2MWT, but had increase in 5xSTS, most likely secondary to increased  pain today. He should continue to improve with HEP compliance, but has maximized current rehab potential at this time. Pt in agreement with assessment and ready to discharge at this time.   OBJECTIVE IMPAIRMENTS Abnormal gait, cardiopulmonary status limiting activity, decreased activity tolerance, decreased balance, decreased knowledge of condition, difficulty walking, decreased ROM, decreased strength, postural dysfunction, obesity, and pain.    ACTIVITY LIMITATIONS carrying, lifting, bending, standing, squatting, stairs, and locomotion level   PARTICIPATION LIMITATIONS: meal prep, cleaning, laundry, shopping, and community activity   PERSONAL FACTORS Fitness, Past/current experiences, Time since onset of injury/illness/exacerbation, and 3+ comorbidities:    Saddle pulmonary embolii, Heart failure, atrial arrhythmia, CHF, Afib, Respiratory failure, CHOLECYSTECTOMY-2019, COLON SURGERY-Cancer 2013, INCISIONAL HERNIA REPAIR, DM, low back pain, R groin pain, R flank pain, obesityare also affecting patient's functional outcome.      GOALS:   SHORT TERM GOALS: Target date: 02/24/2022  Pt will be Ind in an  initial HEP Baseline: Not initiated Goal status: MET   LONG TERM GOALS: Target date: 05/04/2022    Pt will be Ind in a final HEP to maintain achieved LOF Baseline: Not initiated Goal status: MET   2.  Increase hip strength to 4/5 and knee strength to 5/5 for improved functional ability and safety Baseline: see flow sheets Goal status: MET   3.  Improve 5xSTS by MCID of 5" and 2MWT by MCID of 16f as indication of improved functional mobility Baseline: 2 MWT 172 feet 02/21/22, 5 x STS TBA 03/30/2022: 15 seconds  Goal status: PARTIALLY MET   4.  Pt's FOTO score will improved to the predicted value of 49% as indication of improved function Baseline: 34% function 05/10/2022: 34% function Goal status: NOT MET   PLAN: PT FREQUENCY: 2x/week   PT DURATION: 6 weeks   PLANNED INTERVENTIONS: Therapeutic exercises, Therapeutic activity, Balance training, Gait training, Patient/Family education, Self Care, Joint mobilization, Cryotherapy, Moist heat, Taping, Manual therapy, and Re-evaluation   PLAN FOR NEXT SESSION: Review FOTO; initiate HEP; use of modalities, manual therapy  DWard ChattersPT  05/10/22 5:40 PM

## 2022-05-18 ENCOUNTER — Other Ambulatory Visit: Payer: Self-pay | Admitting: Thoracic Surgery (Cardiothoracic Vascular Surgery)

## 2022-05-18 DIAGNOSIS — I7121 Aneurysm of the ascending aorta, without rupture: Secondary | ICD-10-CM

## 2022-06-02 ENCOUNTER — Other Ambulatory Visit (HOSPITAL_COMMUNITY): Payer: Self-pay | Admitting: Family Medicine

## 2022-06-05 ENCOUNTER — Other Ambulatory Visit (HOSPITAL_COMMUNITY): Payer: Self-pay | Admitting: Internal Medicine

## 2022-06-28 NOTE — Progress Notes (Addendum)
[address]       [place] [address]             [phone]   PCP is Martin[place], Julie M, NP Referring Provider is [place], Julie M, NP  Chief Complaint: Ascending thoracic aortic aneurysm   HPI: This is a 62 year old male with multiple medical problems including history of stage III Colon cancer treated with chemotherapy, hypertension, diabetes mellitus Type 2, HLD, H/O LLE DVT, atrial flutter, CHF, NICM, mitral regurgitation, and tobacco abuse who has been followed by [facility] for an ascending thoracic aortic aneurysm. He was admitted in December of 2022 with CHF exacerbation with hypoxic respiratory failure requiring intubation. Since that time, he has been followed by the Advanced Heart Failure clinic and Dr. Haroldine Laws and he has been optimized on GDMT. He has remained chronically deconditioned and has chronic back pain for which he receives chronic pain medication from a pain clinic. He presents today for surveillance of his ascending aortic aneurysm. He states he has been having right groin pain and pain/numbness in toes.  Past Medical History:  Diagnosis Date   Cancer [facility])    Colon cancer metastasized to liver ([place]) 01/05/2012   Colon carcinoma ([place])    colon ca dx 2001;   Diabetes mellitus without complication ([place])    Hypertension    Muscle weakness-general 01/05/2012    Past Surgical History:  Procedure Laterality Date   CHOLECYSTECTOMY     COLON SURGERY     sigmoid colectomy, appendectomy   INCISIONAL HERNIA REPAIR N/A 11/27/2017   Procedure: LAPAROSCOPIC ASSISTED INCISIONAL HERNIA;  Surgeon: Clovis Riley, MD;  [facility];  Service: General;  Laterality: N/A;   INSERTION OF MESH N/A 11/27/2017   Procedure: INSERTION OF MESH;  Surgeon: Clovis Riley, MD;  [facility];  Service: General;  Laterality: N/A;   LAPAROSCOPIC LYSIS OF ADHESIONS N/A 11/27/2017   Procedure: LAPAROSCOPIC LYSIS OF ADHESIONS;  Surgeon: Clovis Riley, MD;   [facility];  Service: General;  Laterality: N/A;   LIVER SURGERY     partial removal duke in 2003   PORT-A-CATH REMOVAL Right 07/16/2012   Procedure: REMOVAL PORT-A-CATH;  Surgeon: Haywood Lasso, MD;  Location: [place];  Service: General;  Laterality: Right;   RIGHT/LEFT HEART CATH AND CORONARY ANGIOGRAPHY N/A 04/01/2021   Procedure: RIGHT/LEFT HEART CATH AND CORONARY ANGIOGRAPHY;  Surgeon: Jolaine Artist, MD;  Location: [place] CV LAB;  Service: Cardiovascular;  Laterality: N/A;   Family History: Unknown as he was raised by his grandmother  Social History Social History   Tobacco Use   Smoking status: Former    Packs/day: 0.50    Years: 32.00    Total pack years: 16.00    Types: Cigarettes    Quit date: 05/19/2015    Years since quitting: 7.1   Smokeless tobacco: Never  Vaping Use   Vaping Use: Never used  Substance Use Topics   Alcohol use: No   Drug use: No    Current Outpatient Medications  Medication Sig Dispense Refill   amiodarone (PACERONE) 200 MG tablet TAKE 1 TABLET (200 MG TOTAL) BY MOUTH DAILY. TAKE 2 PILLS DAILY FOR 1 WEEK THEN ONCE A DAY 90 tablet 2   carvedilol (COREG) 3.125 MG tablet TAKE 1 TABLET BY MOUTH 2 TIMES DAILY. 180 tablet 3   cyclobenzaprine (FLEXERIL) 10 MG tablet Take 10 mg by mouth 3 (three) times daily as needed  for muscle spasms.     digoxin (LANOXIN) 0.125 MG tablet TAKE 1 TABLET BY MOUTH DAILY 90 tablet 2   empagliflozin (JARDIANCE) 10 MG TABS tablet Take 1 tablet (10 mg total) by mouth daily. 90 tablet 2   enoxaparin (LOVENOX) 120 MG/0.8ML injection Inject 0.8 mLs (120 mg total) into the skin 2 (two) times daily. 45.6 mL 1   furosemide (LASIX) 40 MG tablet TAKE 1 TABLET (40 MG TOTAL) BY MOUTH DAILY. NEEDS FOLLOW UP APPOINTMENT FOR FURTHER REFILLS 60 tablet 0   gabapentin (NEURONTIN) 300 MG capsule Take 300 mg by mouth 2 (two) times daily.  6   KLOR-CON M20 20 MEQ tablet TAKE 1 TABLET BY MOUTH EVERY DAY 90  tablet 0   metFORMIN (GLUCOPHAGE-XR) 500 MG 24 hr tablet Take 1,000 mg by mouth 2 (two) times daily.  1   oxyCODONE-acetaminophen (PERCOCET) 7.5-325 MG tablet Take 1 tablet by mouth every 6 (six) hours as needed for severe pain (back pain).     OZEMPIC, 1 MG/DOSE, 4 MG/3ML SOPN Inject 1 mg into the skin every Monday.     rosuvastatin (CRESTOR) 20 MG tablet Take 20 mg by mouth daily.     sacubitril-valsartan (ENTRESTO) 97-103 MG Take 1 tablet by mouth 2 (two) times daily. 180 tablet 2   sildenafil (REVATIO) 20 MG tablet Take 20 mg by mouth daily as needed (ED). (Patient not taking: Reported on 01/25/2022)     spironolactone (ALDACTONE) 25 MG tablet TAKE 1 TABLET (25 MG TOTAL) BY MOUTH DAILY. 90 tablet 2   TRESIBA FLEXTOUCH 200 UNIT/ML FlexTouch Pen Inject 30 Units into the skin every evening.    Allergies: No Known Allergies  Vital Signs: Vitals:   07/05/22 1255  BP: (!) 140/87  Pulse: 72  Resp: 20  SpO2: 95%    Physical Exam CV-RRR, no murmur Neck-No carotid bruit Pulmonary-Clear to auscultation bilaterally Abdomen-Soft, obese, non tender, bowel sounds present Extremities-No LE edema Neurologic-Grossly intact without focal deficit   Diagnostic Tests: Narrative & Impression  CLINICAL DATA:  Metastatic colon cancer. Chest pain. Thoracic aortic aneurysm assessment.   EXAM: CT ANGIOGRAPHY CHEST WITH CONTRAST   TECHNIQUE: Multidetector CT imaging of the chest was performed using the standard protocol during bolus administration of intravenous contrast. Multiplanar CT image reconstructions and MIPs were obtained to evaluate the vascular anatomy.   RADIATION DOSE REDUCTION: This exam was performed according to the departmental dose-optimization program which includes automated exposure control, adjustment of the mA and/or kV according to patient size and/or use of iterative reconstruction technique.   CONTRAST:  34mL ISOVUE-370 IOPAMIDOL (ISOVUE-370) INJECTION 76%    COMPARISON:  12/29/2021   FINDINGS: Cardiovascular: Please note that today's exam was time for systemic arterial opacification to best assess the thoracic aorta, and was not optimized for pulmonary arterial opacification. Do not see an obvious filling defect in the pulmonary arteries although sensitivity for smaller pulmonary embolus is reduced.   Mild atherosclerotic calcification at the origin of the left subclavian artery. Mild atherosclerotic calcification of the aortic arch.   Mild aneurysmal dilatation of the ascending aorta 4.5 cm on image 179 series 9, stable compared to 12/29/2021. No dissection or acute vascular findings.   Mediastinum/Nodes: Unremarkable   Lungs/Pleura: Stable mild lingular scarring. 4 mm subpleural nodule in the left lower lobe on image 101 series 11, stable from 01/04/2010, benign.   Upper Abdomen: Partial left hepatectomy. No definite mass in the remaining right hepatic lobe, faint heterogeneity of enhancement in the liver is  likely attributable to the early phase of contrast administration.   Cystic and calcific lesion of the medial spleen measuring 2.3 by 1.3 cm on image 296 of series 9, previously measuring 2.5 by 1.5 cm on 09/19/2017.   1.4 cm hypodense lesion of the right kidney upper pole with internal density 12 Hounsfield units on image 361 series 9, probably a benign cyst.   Adjacent 2.1 by 1.6 cm complex lesion of the right kidney upper pole posteriorly, internal density up to 18 Hounsfield units favoring mildly complex cyst, some faint calcification along the margin. This has been present on multiple prior exams back through 2019. There is likely some adjacent renal scarring.   Musculoskeletal: Mild thoracic spondylosis.   Review of the MIP images confirms the above findings.   IMPRESSION: 1. 4.5 cm ascending aortic aneurysm, stable compared to 12/29/2021. Ascending thoracic aortic aneurysm. Recommend semi-annual  imaging followup by CTA or MRA and referral to cardiothoracic surgery if not already obtained. This recommendation follows 2010 ACCF/AHA/AATS/ACR/ASA/SCA/SCAI/SIR/STS/SVM Guidelines for the Diagnosis and Management of Patients With Thoracic Aortic Disease. Circulation. 2010; [id number]:[id number]. Aortic aneurysm NOS (ICD10-I71.9) 2. Stable cystic and calcific lesion of the medial spleen, long-term stability favoring benign etiology. 3. Stable complex lesion of the right kidney upper pole posteriorly, favoring adjacent Bosniak category 1 and category 2 cyst. No further imaging workup of these lesions is indicated. 4. Partial left hepatectomy. 5. Mild atherosclerotic calcification of the aortic arch and origin of the left subclavian artery. 6. 0.4 cm left lower lobe subpleural nodule is unchanged from 2011 and considered benign. No further imaging workup of this lesion is indicated.   Aortic Atherosclerosis (ICD10-I70.0).     Electronically Signed   By: Van Clines M.D.   On: 07/05/2022 12:50      Impression and Plan: CTA with a 4.5 cm ascending aortic aneurysm.  In addition, a 0.4 cm LLL subpleural nodule is also unchanged (since 2011). We discussed the natural history and and risk factors for growth of ascending aortic aneurysms.  We covered the importance of smoking cessation, tight blood pressure control (BP is a little elevated at this office visit;may be because he is in a fair amount of pain (right groin/leg, right foot), refraining from lifting heavy objects, and avoiding fluoroquinolones.   We will continue surveillance and a repeat CTA was ordered for 6 months.    Nani Skillern, PA-C [facility] [phone]

## 2022-07-05 ENCOUNTER — Ambulatory Visit (INDEPENDENT_AMBULATORY_CARE_PROVIDER_SITE_OTHER): Payer: 59 | Admitting: Physician Assistant

## 2022-07-05 ENCOUNTER — Ambulatory Visit
Admission: RE | Admit: 2022-07-05 | Discharge: 2022-07-05 | Disposition: A | Discharge status: Home / Self Care | Payer: 59 | Source: Ambulatory Visit | Attending: Thoracic Surgery (Cardiothoracic Vascular Surgery) | Admitting: Thoracic Surgery (Cardiothoracic Vascular Surgery)

## 2022-07-05 VITALS — BP 140/87 | HR 72 | Resp 20 | Ht 72.0 in | Wt 256.0 lb

## 2022-07-05 DIAGNOSIS — I7121 Aneurysm of the ascending aorta, without rupture: Secondary | ICD-10-CM | POA: Diagnosis not present

## 2022-07-05 MED ORDER — IOPAMIDOL (ISOVUE-370) INJECTION 76%
80.0000 mL | Freq: Once | INTRAVENOUS | Status: AC | PRN
  Administered 2022-07-05: 80 mL via INTRAVENOUS

## 2022-07-05 NOTE — Patient Instructions (Addendum)
  Risk Modification in those with ascending thoracic aortic aneurysm:  Continue good control of blood pressure (prefer SBP 130/80 or less)-continue Coreg and Entresto  2. Avoid fluoroquinolone antibiotics (I.e Ciprofloxacin, Avelox, Levofloxacin, Ofloxacin)  3.  Use of statin (to decrease cardiovascular risk)-continue Crestor  4.  Exercise and activity limitations is individualized, but in general, contact sports are to be  avoided and one should avoid heavy lifting (defined as half of ideal body weight) and exercises involving sustained Valsalva maneuver.  5. Counseling for those suspected of having genetically mediated disease. First-degree relatives of those with TAA disease should be screened as well as those who have a connective tissue disease (I.e with Marfan syndrome, Ehlers-Danlos syndrome,  and Loeys-Dietz syndrome) or a  bicuspid aortic valve,have an increased risk for  complications related to TAA. He has no history of above. Echo done in September 2023 showed trivial AI and MR as well as mild aortic dilatation 42 mm  6. History of tobacco abuse;quit 2017

## 2022-07-16 ENCOUNTER — Other Ambulatory Visit (HOSPITAL_COMMUNITY): Payer: Self-pay | Admitting: Internal Medicine

## 2022-07-28 ENCOUNTER — Other Ambulatory Visit (HOSPITAL_COMMUNITY): Payer: Self-pay | Admitting: Internal Medicine

## 2022-10-02 ENCOUNTER — Other Ambulatory Visit (HOSPITAL_COMMUNITY): Payer: Self-pay | Admitting: Internal Medicine

## 2022-11-18 ENCOUNTER — Other Ambulatory Visit (HOSPITAL_COMMUNITY): Payer: Self-pay | Admitting: Internal Medicine

## 2022-11-27 ENCOUNTER — Other Ambulatory Visit: Payer: Self-pay | Admitting: Thoracic Surgery (Cardiothoracic Vascular Surgery)

## 2022-11-27 DIAGNOSIS — I7121 Aneurysm of the ascending aorta, without rupture: Secondary | ICD-10-CM

## 2022-12-27 ENCOUNTER — Other Ambulatory Visit (HOSPITAL_COMMUNITY): Payer: Self-pay | Admitting: Physician Assistant

## 2022-12-27 DIAGNOSIS — M5416 Radiculopathy, lumbar region: Secondary | ICD-10-CM

## 2022-12-29 ENCOUNTER — Other Ambulatory Visit (HOSPITAL_COMMUNITY): Payer: Self-pay | Admitting: Internal Medicine

## 2022-12-29 ENCOUNTER — Ambulatory Visit (HOSPITAL_COMMUNITY)
Admission: RE | Admit: 2022-12-29 | Discharge: 2022-12-29 | Disposition: A | Discharge status: Home / Self Care | Payer: 59 | Source: Ambulatory Visit | Attending: Physician Assistant | Admitting: Physician Assistant

## 2022-12-29 DIAGNOSIS — M5416 Radiculopathy, lumbar region: Secondary | ICD-10-CM | POA: Insufficient documentation

## 2023-01-08 ENCOUNTER — Ambulatory Visit (INDEPENDENT_AMBULATORY_CARE_PROVIDER_SITE_OTHER): Payer: 59 | Admitting: Surgical

## 2023-01-08 ENCOUNTER — Ambulatory Visit
Admission: RE | Admit: 2023-01-08 | Discharge: 2023-01-08 | Disposition: A | Discharge status: Home / Self Care | Payer: 59 | Source: Ambulatory Visit | Attending: Thoracic Surgery (Cardiothoracic Vascular Surgery) | Admitting: Thoracic Surgery (Cardiothoracic Vascular Surgery)

## 2023-01-08 VITALS — BP 134/81 | HR 74 | Ht 72.0 in | Wt 241.0 lb

## 2023-01-08 DIAGNOSIS — I7121 Aneurysm of the ascending aorta, without rupture: Secondary | ICD-10-CM | POA: Diagnosis not present

## 2023-01-08 MED ORDER — IOPAMIDOL (ISOVUE-370) INJECTION 76%
200.0000 mL | Freq: Once | INTRAVENOUS | Status: AC | PRN
  Administered 2023-01-08: 75 mL via INTRAVENOUS

## 2023-01-08 NOTE — Patient Instructions (Signed)
Continue good blood pressure control and management of all your chronic medical conditions.  Encourage healthy lifestyle practices.

## 2023-01-08 NOTE — Progress Notes (Unsigned)
Subjective:     Patient ID: Raymond Allen, male    DOB: October 19, 1960, 62 y.o.   MRN: [id number]  Chief Complaint  Patient presents with   Thoracic Aortic Aneurysm    6 month f/u with Chest CT    HPI Patient is in today for follow-up appointment regarding ongoing surveillance of thoracic aortic aneurysm.  He has a complex medical history and primary complaint at this time is excruciating groin pain that he has been dealing with for several years without what he describes as significant relief.  He also reports that he is having some hematuria.  He says he has rare chest pain and occasional shortness of breath.  He is highly focused on his primary complaint of this pain and was not interested in discussing anything else.  He does understand the significance of the aneurysm and I reiterated its importance in terms of need for monitoring.  Past Medical History:  Diagnosis Date   Cancer [facility])    Colon cancer metastasized to liver (HCC) 01/05/2012   Colon carcinoma (HCC)    colon ca dx 2001;   Diabetes mellitus without complication (HCC)    Hypertension    Muscle weakness-general 01/05/2012     Past Surgical History:  Procedure Laterality Date   CHOLECYSTECTOMY     COLON SURGERY     sigmoid colectomy, appendectomy   INCISIONAL HERNIA REPAIR N/A 11/27/2017   Procedure: LAPAROSCOPIC ASSISTED INCISIONAL HERNIA;  Surgeon: Berna Bue, MD;  Location: [facility];  Service: General;  Laterality: N/A;   INSERTION OF MESH N/A 11/27/2017   Procedure: INSERTION OF MESH;  Surgeon: Berna Bue, MD;  Location: [facility];  Service: General;  Laterality: N/A;   LAPAROSCOPIC LYSIS OF ADHESIONS N/A 11/27/2017   Procedure: LAPAROSCOPIC LYSIS OF ADHESIONS;  Surgeon: Berna Bue, MD;  Location: [facility];  Service: General;  Laterality: N/A;   LIVER SURGERY     partial removal [facility] in 2003   PORT-A-CATH REMOVAL Right 07/16/2012   Procedure: REMOVAL PORT-A-CATH;  Surgeon: Currie Paris, MD;   Location: [facility];  Service: General;  Laterality: Right;   RIGHT/LEFT HEART CATH AND CORONARY ANGIOGRAPHY N/A 04/01/2021   Procedure: RIGHT/LEFT HEART CATH AND CORONARY ANGIOGRAPHY;  Surgeon: Dolores Patty, MD;  Location: [facility];  Service: Cardiovascular;  Laterality: N/A;     Current Outpatient Medications  Medication Instructions   amiodarone (PACERONE) 200 MG tablet TAKE 1 TABLET (200 MG TOTAL) BY MOUTH DAILY. TAKE 2 PILLS DAILY FOR 1 WEEK THEN ONCE A DAY   carvedilol (COREG) 3.125 mg, Oral, 2 times daily   cyclobenzaprine (FLEXERIL) 10 mg, Oral, 3 times daily PRN   digoxin (LANOXIN) 125 mcg, Oral, Daily   empagliflozin (JARDIANCE) 10 mg, Oral, Daily   enoxaparin (LOVENOX) 120 mg, Subcutaneous, 2 times daily   furosemide (LASIX) 40 mg, Oral, Daily, Needs follow up appointment for further refills   gabapentin (NEURONTIN) 300 mg, Oral, 2 times daily   KLOR-CON M20 20 MEQ tablet TAKE 1 TABLET (20 MEQ TOTAL) BY MOUTH DAILY. NEEDS FOLLOW UP APPOINTMENT FOR MORE REFILLS   metFORMIN (GLUCOPHAGE-XR) 1,000 mg, Oral, 2 times daily   oxyCODONE-acetaminophen (PERCOCET) 7.5-325 MG tablet 1 tablet, Oral, Every 6 hours PRN   Ozempic (1 MG/DOSE) 1 mg, Subcutaneous, Every Mon   rosuvastatin (CRESTOR) 20 mg, Oral, Daily   sacubitril-valsartan (ENTRESTO) 97-103 MG 1 tablet, Oral, 2 times daily   sildenafil (REVATIO) 20 mg, Oral,  Daily PRN   spironolactone (ALDACTONE) 25 mg, Oral, Daily   Tresiba FlexTouch 30 Units, Subcutaneous, Every evening     Social History   Occupational History   Not on file  Tobacco Use   Smoking status: Former    Current packs/day: 0.00    Average packs/day: 0.5 packs/day for 32.0 years (16.0 ttl pk-yrs)    Types: Cigarettes    Start date: 05/19/1983    Quit date: 05/19/2015    Years since quitting: 7.6   Smokeless tobacco: Never  Vaping Use   Vaping status: Never Used  Substance and Sexual Activity   Alcohol use: No   Drug use: No    Sexual activity: Not on file     No Known Allergies     Objective:    There were no vitals taken for this visit. BP Readings from Last 3 Encounters:  01/08/23 134/81  07/05/22 (!) 140/87  02/03/22 111/85   Wt Readings from Last 3 Encounters:  01/08/23 241 lb (109.3 kg)  07/05/22 256 lb (116.1 kg)  01/25/22 258 lb (117 kg)      Physical Exam Vitals reviewed.  Constitutional:      Appearance: Normal appearance. He is obese.     Comments: Appears chronically ill  HENT:     Head: Normocephalic and atraumatic.  Eyes:     General: No scleral icterus. Neck:     Vascular: No carotid bruit.  Cardiovascular:     Rate and Rhythm: Normal rate and regular rhythm.     Heart sounds: No murmur heard. Pulmonary:     Effort: Pulmonary effort is normal.     Breath sounds: Normal breath sounds.  Abdominal:     Palpations: Abdomen is soft.     Tenderness: There is no abdominal tenderness.  Musculoskeletal:     Right lower leg: No edema.     Left lower leg: No edema.  Skin:    General: Skin is warm and dry.  Neurological:     Mental Status: He is alert.     Comments: Somewhat limited generalized mobility due to pain  Psychiatric:     Comments: He is emotionally upset primarily due to pain.     No results found for any visits on 01/08/23.     ECHOCARDIOGRAM REPORT       Patient Name:   Raymond Allen Date of Exam: 12/30/2021  Medical Rec #:  [id number]        Height:       72.0 in  Accession #:    [id number]       Weight:       260.0 lb  Date of Birth:  Dec 17, 1960        BSA:          2.382 m  Patient Age:    61 years         BP:           121/92 mmHg  Patient Gender: M                HR:           87 bpm.  Exam Location:  Inpatient   Procedure: 2D Echo, Color Doppler, Cardiac Doppler and Intracardiac             Opacification Agent   Indications:    Pulmonary embolus    History:        Patient has prior history of Echocardiogram examinations,  most  recent 07/15/2021. Risk Factors:Hypertension and Diabetes.    Sonographer:    Gaynell Face  Referring Phys: [id number] OLADAPO ADEFESO   IMPRESSIONS     1. Left ventricular ejection fraction, by estimation, is 30 to 35%. The  left ventricle has moderately decreased function. The left ventricle  demonstrates regional wall motion abnormalities (see scoring  diagram/findings for description). The left  ventricular internal cavity size was mildly dilated. There is mild left  ventricular hypertrophy. Left ventricular diastolic parameters are  indeterminate.   2. Right ventricular systolic function is normal. The right ventricular  size is normal.   3. Left atrial size was mild to moderately dilated.   4. The mitral valve is normal in structure. Trivial mitral valve  regurgitation. No evidence of mitral stenosis.   5. The aortic valve was not well visualized. There is mild calcification  of the aortic valve. Aortic valve regurgitation is trivial. Aortic valve  sclerosis/calcification is present, without any evidence of aortic  stenosis.   6. Aortic dilatation noted. There is mild dilatation of the ascending  aorta, measuring 42 mm.   Comparison(s): No significant change from prior study.   Conclusion(s)/Recommendation(s): No left ventricular mural or apical  thrombus/thrombi.   FINDINGS   Left Ventricle: Left ventricular ejection fraction, by estimation, is 30  to 35%. The left ventricle has moderately decreased function. The left  ventricle demonstrates regional wall motion abnormalities. Definity  contrast agent was given IV to delineate  the left ventricular endocardial borders. The left ventricular internal  cavity size was mildly dilated. There is mild left ventricular  hypertrophy. Left ventricular diastolic parameters are indeterminate.   Right Ventricle: The right ventricular size is normal. Right vetricular  wall thickness was not well visualized. Right ventricular  systolic  function is normal.   Left Atrium: Left atrial size was mild to moderately dilated.   Right Atrium: Right atrial size was normal in size.   Pericardium: There is no evidence of pericardial effusion.   Mitral Valve: The mitral valve is normal in structure. Trivial mitral  valve regurgitation. No evidence of mitral valve stenosis.   Tricuspid Valve: The tricuspid valve is not well visualized. Tricuspid  valve regurgitation is trivial. No evidence of tricuspid stenosis.   Aortic Valve: The aortic valve was not well visualized. There is mild  calcification of the aortic valve. Aortic valve regurgitation is trivial.  Aortic valve sclerosis/calcification is present, without any evidence of  aortic stenosis. Aortic valve mean  gradient measures 2.0 mmHg. Aortic valve peak gradient measures 4.2 mmHg.  Aortic valve area, by VTI measures 4.45 cm.   Pulmonic Valve: The pulmonic valve was not well visualized. Pulmonic valve  regurgitation is not visualized.   Aorta: Aortic dilatation noted. There is mild dilatation of the ascending  aorta, measuring 42 mm.   Venous: The inferior vena cava was not well visualized.   IAS/Shunts: The interatrial septum was not well visualized.     LEFT VENTRICLE  PLAX 2D  LVIDd:         5.10 cm      Diastology  LVIDs:         4.30 cm      LV e' medial:    5.77 cm/s  LV PW:         1.30 cm      LV E/e' medial:  8.2  LV IVS:        1.40 cm      LV e' lateral:  6.09 cm/s  LVOT diam:     2.70 cm      LV E/e' lateral: 7.7  LV SV:         76  LV SV Index:   32  LVOT Area:     5.73 cm    LV Volumes (MOD)  LV vol d, MOD A2C: 166.0 ml  LV vol d, MOD A4C: 189.0 ml  LV vol s, MOD A2C: 108.0 ml  LV vol s, MOD A4C: 124.0 ml  LV SV MOD A2C:     58.0 ml  LV SV MOD A4C:     189.0 ml  LV SV MOD BP:      64.5 ml   RIGHT VENTRICLE  TAPSE (M-mode): 2.3 cm   LEFT ATRIUM             Index        RIGHT ATRIUM           Index  LA Vol (A2C):   66.9 ml  28.09 ml/m  RA Area:     12.50 cm  LA Vol (A4C):   68.8 ml 28.88 ml/m  RA Volume:   24.90 ml  10.45 ml/m  LA Biplane Vol: 72.3 ml 30.35 ml/m   AORTIC VALVE  AV Area (Vmax):    4.29 cm  AV Area (Vmean):   4.33 cm  AV Area (VTI):     4.45 cm  AV Vmax:           102.00 cm/s  AV Vmean:          67.900 cm/s  AV VTI:            0.171 m  AV Peak Grad:      4.2 mmHg  AV Mean Grad:      2.0 mmHg  LVOT Vmax:         76.50 cm/s  LVOT Vmean:        51.400 cm/s  LVOT VTI:          0.133 m  LVOT/AV VTI ratio: 0.78    AORTA  Ao Root diam: 4.10 cm  Ao Asc diam:  4.20 cm   MITRAL VALVE  MV Area (PHT): 3.77 cm    SHUNTS  MV Decel Time: 201 msec    Systemic VTI:  0.13 m  MV E velocity: 47.10 cm/s  Systemic Diam: 2.70 cm  MV A velocity: 91.20 cm/s  MV E/A ratio:  0.52   Jodelle Red MD  Electronically signed by Jodelle Red MD  Signature Date/Time: 12/30/2021/3:07:26 PM      Assessment & Plan:   Known 4.5 cm ascending thoracic aortic aneurysm.  His CT scan has not been officially read by radiology but per my readings it appears very stable without significant change.  Should this be different I will contact the patient with results.  I also encouraged him to use obtain a urology appointment to his hematuria and it does not appear clear what his etiology is for his longstanding groin pain.  He has been treated for back pain which may or may not be related to this.  I encouraged him to keep good control of his medical conditions including blood pressure.  We will obtain a repeat chest CTA in 6 months to continue ongoing surveillance.  I am not entirely sure due to his significant history and chronic issues that he would be a candidate for elective repair.  Problem List Items Addressed This Visit  None Visit Diagnoses     Aneurysm of ascending aorta without rupture (HCC)    -  Primary       No orders of the defined types were placed in this encounter.   No  follow-ups on file.  Rowe Clack, PA-C

## 2023-01-21 ENCOUNTER — Inpatient Hospital Stay (HOSPITAL_COMMUNITY): Payer: 59

## 2023-01-21 ENCOUNTER — Other Ambulatory Visit: Payer: Self-pay

## 2023-01-21 ENCOUNTER — Emergency Department (HOSPITAL_COMMUNITY): Payer: 59

## 2023-01-21 ENCOUNTER — Inpatient Hospital Stay (HOSPITAL_COMMUNITY)
Admission: EM | Admit: 2023-01-21 | Discharge: 2023-01-26 | DRG: 579 | Disposition: A | Discharge status: Home / Self Care | Payer: 59 | Attending: Internal Medicine | Admitting: Internal Medicine

## 2023-01-21 DIAGNOSIS — E785 Hyperlipidemia, unspecified: Secondary | ICD-10-CM | POA: Diagnosis present

## 2023-01-21 DIAGNOSIS — K5900 Constipation, unspecified: Secondary | ICD-10-CM | POA: Diagnosis not present

## 2023-01-21 DIAGNOSIS — S8012XA Contusion of left lower leg, initial encounter: Principal | ICD-10-CM | POA: Diagnosis present

## 2023-01-21 DIAGNOSIS — N189 Chronic kidney disease, unspecified: Secondary | ICD-10-CM | POA: Diagnosis present

## 2023-01-21 DIAGNOSIS — S81812A Laceration without foreign body, left lower leg, initial encounter: Secondary | ICD-10-CM | POA: Diagnosis present

## 2023-01-21 DIAGNOSIS — L0390 Cellulitis, unspecified: Secondary | ICD-10-CM | POA: Diagnosis present

## 2023-01-21 DIAGNOSIS — R300 Dysuria: Secondary | ICD-10-CM | POA: Diagnosis present

## 2023-01-21 DIAGNOSIS — Z79899 Other long term (current) drug therapy: Secondary | ICD-10-CM

## 2023-01-21 DIAGNOSIS — T45515A Adverse effect of anticoagulants, initial encounter: Secondary | ICD-10-CM | POA: Diagnosis present

## 2023-01-21 DIAGNOSIS — E860 Dehydration: Secondary | ICD-10-CM | POA: Diagnosis present

## 2023-01-21 DIAGNOSIS — I132 Hypertensive heart and chronic kidney disease with heart failure and with stage 5 chronic kidney disease, or end stage renal disease: Secondary | ICD-10-CM | POA: Diagnosis not present

## 2023-01-21 DIAGNOSIS — Z9221 Personal history of antineoplastic chemotherapy: Secondary | ICD-10-CM

## 2023-01-21 DIAGNOSIS — E1122 Type 2 diabetes mellitus with diabetic chronic kidney disease: Secondary | ICD-10-CM | POA: Diagnosis present

## 2023-01-21 DIAGNOSIS — Z23 Encounter for immunization: Secondary | ICD-10-CM

## 2023-01-21 DIAGNOSIS — G8929 Other chronic pain: Secondary | ICD-10-CM | POA: Diagnosis present

## 2023-01-21 DIAGNOSIS — N170 Acute kidney failure with tubular necrosis: Secondary | ICD-10-CM | POA: Diagnosis present

## 2023-01-21 DIAGNOSIS — Z7984 Long term (current) use of oral hypoglycemic drugs: Secondary | ICD-10-CM | POA: Diagnosis not present

## 2023-01-21 DIAGNOSIS — W228XXA Striking against or struck by other objects, initial encounter: Secondary | ICD-10-CM | POA: Diagnosis present

## 2023-01-21 DIAGNOSIS — Y929 Unspecified place or not applicable: Secondary | ICD-10-CM

## 2023-01-21 DIAGNOSIS — I5022 Chronic systolic (congestive) heart failure: Secondary | ICD-10-CM | POA: Diagnosis present

## 2023-01-21 DIAGNOSIS — L03116 Cellulitis of left lower limb: Secondary | ICD-10-CM | POA: Diagnosis present

## 2023-01-21 DIAGNOSIS — I480 Paroxysmal atrial fibrillation: Secondary | ICD-10-CM | POA: Diagnosis present

## 2023-01-21 DIAGNOSIS — N179 Acute kidney failure, unspecified: Secondary | ICD-10-CM | POA: Diagnosis not present

## 2023-01-21 DIAGNOSIS — I130 Hypertensive heart and chronic kidney disease with heart failure and stage 1 through stage 4 chronic kidney disease, or unspecified chronic kidney disease: Secondary | ICD-10-CM | POA: Diagnosis present

## 2023-01-21 DIAGNOSIS — Z7901 Long term (current) use of anticoagulants: Secondary | ICD-10-CM

## 2023-01-21 DIAGNOSIS — Z7985 Long-term (current) use of injectable non-insulin antidiabetic drugs: Secondary | ICD-10-CM

## 2023-01-21 DIAGNOSIS — Z6832 Body mass index (BMI) 32.0-32.9, adult: Secondary | ICD-10-CM

## 2023-01-21 DIAGNOSIS — E1152 Type 2 diabetes mellitus with diabetic peripheral angiopathy with gangrene: Secondary | ICD-10-CM | POA: Diagnosis present

## 2023-01-21 DIAGNOSIS — Z8505 Personal history of malignant neoplasm of liver: Secondary | ICD-10-CM

## 2023-01-21 DIAGNOSIS — Z888 Allergy status to other drugs, medicaments and biological substances status: Secondary | ICD-10-CM

## 2023-01-21 DIAGNOSIS — I509 Heart failure, unspecified: Secondary | ICD-10-CM | POA: Diagnosis not present

## 2023-01-21 DIAGNOSIS — R1031 Right lower quadrant pain: Secondary | ICD-10-CM | POA: Diagnosis present

## 2023-01-21 DIAGNOSIS — E1169 Type 2 diabetes mellitus with other specified complication: Secondary | ICD-10-CM | POA: Diagnosis present

## 2023-01-21 DIAGNOSIS — Z1152 Encounter for screening for COVID-19: Secondary | ICD-10-CM | POA: Diagnosis not present

## 2023-01-21 DIAGNOSIS — Z85038 Personal history of other malignant neoplasm of large intestine: Secondary | ICD-10-CM

## 2023-01-21 DIAGNOSIS — D6832 Hemorrhagic disorder due to extrinsic circulating anticoagulants: Secondary | ICD-10-CM | POA: Diagnosis present

## 2023-01-21 DIAGNOSIS — Z9089 Acquired absence of other organs: Secondary | ICD-10-CM

## 2023-01-21 DIAGNOSIS — Z87891 Personal history of nicotine dependence: Secondary | ICD-10-CM | POA: Diagnosis not present

## 2023-01-21 DIAGNOSIS — Z79891 Long term (current) use of opiate analgesic: Secondary | ICD-10-CM

## 2023-01-21 DIAGNOSIS — E669 Obesity, unspecified: Secondary | ICD-10-CM | POA: Diagnosis present

## 2023-01-21 LAB — CBC WITH DIFFERENTIAL/PLATELET
Abs Immature Granulocytes: 0.03 10*3/uL (ref 0.00–0.07)
Basophils Absolute: 0.1 10*3/uL (ref 0.0–0.1)
Basophils Relative: 1 %
Eosinophils Absolute: 0.2 10*3/uL (ref 0.0–0.5)
Eosinophils Relative: 2 %
HCT: 47.3 % (ref 39.0–52.0)
Hemoglobin: 14.7 g/dL (ref 13.0–17.0)
Immature Granulocytes: 0 %
Lymphocytes Relative: 28 %
Lymphs Abs: 2.1 10*3/uL (ref 0.7–4.0)
MCH: 25.7 pg — ABNORMAL LOW (ref 26.0–34.0)
MCHC: 31.1 g/dL (ref 30.0–36.0)
MCV: 82.7 fL (ref 80.0–100.0)
Monocytes Absolute: 0.6 10*3/uL (ref 0.1–1.0)
Monocytes Relative: 8 %
Neutro Abs: 4.7 10*3/uL (ref 1.7–7.7)
Neutrophils Relative %: 61 %
Platelets: 286 10*3/uL (ref 150–400)
RBC: 5.72 MIL/uL (ref 4.22–5.81)
RDW: 14.1 % (ref 11.5–15.5)
WBC: 7.7 10*3/uL (ref 4.0–10.5)
nRBC: 0 % (ref 0.0–0.2)

## 2023-01-21 LAB — TYPE AND SCREEN
ABO/RH(D): B POS
Antibody Screen: NEGATIVE

## 2023-01-21 LAB — MRSA NEXT GEN BY PCR, NASAL: MRSA by PCR Next Gen: NOT DETECTED

## 2023-01-21 LAB — BASIC METABOLIC PANEL
Anion gap: 16 — ABNORMAL HIGH (ref 5–15)
BUN: 16 mg/dL (ref 8–23)
CO2: 24 mmol/L (ref 22–32)
Calcium: 9.4 mg/dL (ref 8.9–10.3)
Chloride: 97 mmol/L — ABNORMAL LOW (ref 98–111)
Creatinine, Ser: 1.72 mg/dL — ABNORMAL HIGH (ref 0.61–1.24)
GFR, Estimated: 44 mL/min — ABNORMAL LOW (ref >60)
Glucose, Bld: 110 mg/dL — ABNORMAL HIGH (ref 70–99)
Potassium: 4.1 mmol/L (ref 3.5–5.1)
Sodium: 137 mmol/L (ref 135–145)

## 2023-01-21 LAB — I-STAT CHEM 8, ED
BUN: 19 mg/dL (ref 8–23)
Calcium, Ion: 1.11 mmol/L — ABNORMAL LOW (ref 1.15–1.40)
Chloride: 100 mmol/L (ref 98–111)
Creatinine, Ser: 1.8 mg/dL — ABNORMAL HIGH (ref 0.61–1.24)
Glucose, Bld: 107 mg/dL — ABNORMAL HIGH (ref 70–99)
HCT: 48 % (ref 39.0–52.0)
Hemoglobin: 16.3 g/dL (ref 13.0–17.0)
Potassium: 4 mmol/L (ref 3.5–5.1)
Sodium: 138 mmol/L (ref 135–145)
TCO2: 25 mmol/L (ref 22–32)

## 2023-01-21 LAB — ABO/RH: ABO/RH(D): B POS

## 2023-01-21 LAB — CBG MONITORING, ED
Glucose-Capillary: 53 mg/dL — ABNORMAL LOW (ref 70–99)
Glucose-Capillary: 63 mg/dL — ABNORMAL LOW (ref 70–99)
Glucose-Capillary: 51 mg/dL — ABNORMAL LOW (ref 70–99)

## 2023-01-21 LAB — PROTIME-INR
INR: 1.3 — ABNORMAL HIGH (ref 0.8–1.2)
Prothrombin Time: 16.3 s — ABNORMAL HIGH (ref 11.4–15.2)

## 2023-01-21 LAB — SARS CORONAVIRUS 2 BY RT PCR: SARS Coronavirus 2 by RT PCR: NEGATIVE

## 2023-01-21 MED ORDER — POLYETHYLENE GLYCOL 3350 17 G PO PACK: 17.0000 g | PACK | Freq: Every day | ORAL | Status: DC | PRN

## 2023-01-21 MED ORDER — ACETAMINOPHEN 325 MG PO TABS
325.0000 mg | ORAL_TABLET | Freq: Four times a day (QID) | ORAL | Status: DC | PRN
  Administered 2023-01-22: 325 mg via ORAL
  Filled 2023-01-21: qty 1

## 2023-01-21 MED ORDER — SODIUM CHLORIDE 0.9 % IV BOLUS
1000.0000 mL | Freq: Once | INTRAVENOUS | Status: AC
  Administered 2023-01-21: 1000 mL via INTRAVENOUS

## 2023-01-21 MED ORDER — TETANUS-DIPHTH-ACELL PERTUSSIS 5-2.5-18.5 LF-MCG/0.5 IM SUSY
0.5000 mL | PREFILLED_SYRINGE | Freq: Once | INTRAMUSCULAR | Status: AC
  Administered 2023-01-21: 0.5 mL via INTRAMUSCULAR
  Filled 2023-01-21: qty 0.5

## 2023-01-21 MED ORDER — SODIUM CHLORIDE 0.9 % IV SOLN
2.0000 g | INTRAVENOUS | Status: DC
  Administered 2023-01-21 – 2023-01-25 (×5): 2 g via INTRAVENOUS
  Filled 2023-01-21 (×5): qty 20

## 2023-01-21 MED ORDER — DEXTROSE 50 % IV SOLN
50.0000 mL | INTRAVENOUS | Status: DC | PRN
  Administered 2023-01-21: 50 mL via INTRAVENOUS
  Filled 2023-01-21 (×2): qty 50

## 2023-01-21 MED ORDER — MELATONIN 5 MG PO TABS
5.0000 mg | ORAL_TABLET | Freq: Every evening | ORAL | Status: DC | PRN
  Administered 2023-01-23 – 2023-01-25 (×2): 5 mg via ORAL
  Filled 2023-01-21 (×2): qty 1

## 2023-01-21 MED ORDER — DIGOXIN 125 MCG PO TABS
125.0000 ug | ORAL_TABLET | Freq: Every day | ORAL | Status: DC
  Administered 2023-01-22 – 2023-01-26 (×5): 125 ug via ORAL
  Filled 2023-01-21 (×5): qty 1

## 2023-01-21 MED ORDER — MORPHINE SULFATE (PF) 4 MG/ML IV SOLN
6.0000 mg | Freq: Once | INTRAVENOUS | Status: AC
  Administered 2023-01-21: 6 mg via INTRAVENOUS
  Filled 2023-01-21: qty 2

## 2023-01-21 MED ORDER — INSULIN ASPART 100 UNIT/ML IJ SOLN: 0.0000 [IU] | Freq: Every day | INTRAMUSCULAR | Status: DC

## 2023-01-21 MED ORDER — ONDANSETRON HCL 4 MG/2ML IJ SOLN
4.0000 mg | Freq: Once | INTRAMUSCULAR | Status: AC
  Administered 2023-01-21: 4 mg via INTRAVENOUS
  Filled 2023-01-21: qty 2

## 2023-01-21 MED ORDER — PROCHLORPERAZINE EDISYLATE 10 MG/2ML IJ SOLN: 5.0000 mg | Freq: Four times a day (QID) | INTRAMUSCULAR | Status: DC | PRN

## 2023-01-21 MED ORDER — CARVEDILOL 3.125 MG PO TABS
3.1250 mg | ORAL_TABLET | Freq: Two times a day (BID) | ORAL | Status: DC
  Administered 2023-01-21 – 2023-01-26 (×10): 3.125 mg via ORAL
  Filled 2023-01-21 (×10): qty 1

## 2023-01-21 MED ORDER — INSULIN ASPART 100 UNIT/ML IJ SOLN: 0.0000 [IU] | Freq: Three times a day (TID) | INTRAMUSCULAR | Status: DC

## 2023-01-21 MED ORDER — ROSUVASTATIN CALCIUM 20 MG PO TABS
20.0000 mg | ORAL_TABLET | Freq: Every day | ORAL | Status: DC
  Administered 2023-01-22 – 2023-01-26 (×5): 20 mg via ORAL
  Filled 2023-01-21 (×5): qty 1

## 2023-01-21 MED ORDER — AMIODARONE HCL 200 MG PO TABS
200.0000 mg | ORAL_TABLET | Freq: Every day | ORAL | Status: DC
  Administered 2023-01-22 – 2023-01-26 (×5): 200 mg via ORAL
  Filled 2023-01-21 (×5): qty 1

## 2023-01-21 MED ORDER — IOHEXOL 350 MG/ML SOLN
100.0000 mL | Freq: Once | INTRAVENOUS | Status: AC | PRN
  Administered 2023-01-21: 100 mL via INTRAVENOUS

## 2023-01-21 NOTE — H&P (Addendum)
History and Physical  Raymond Allen YNW:[id number] DOB: 29-Jun-1960 DOA: 01/21/2023  Referring physician: Clementeen Hoof  PCP: [place], Julie M, NP  Outpatient Specialists: Cardiology, medical oncology. Patient coming from: Home.  Chief Complaint: Fall, left lower extremity bleed  HPI: Raymond Allen is a 62 y.o. male with medical history significant for paroxysmal A-fib on Eliquis, HFrEF 30-35%, history of colon cancer with metastasis to the liver status post left hepatectomy, cholecystectomy, type 2 diabetes, who presents to the ED from home after feeling poorly for the past 2 days, generalized weakness, and a fall.  He landed on his left lower extremity hitting the side of his trailer.  Symptoms associated with right upper and right lower quadrant abdominal pains.  Denies constipation, last bowel movement was on Friday 2 days ago.  Endorses subjective fevers, night sweats, dysuria, for the past 2 days.  In the ED, the patient was noted to have a left lower extremity hematoma confirmed on CT scan.  On exam erythema, edema, warmth and tenderness with palpation above hematoma with concern for cellulitis.  The patient had an episode of vasovagal spell and was noted to be diaphoretic.  EDP discussed the case with orthopedic surgery, will see in consultation.  [facility], hospitalist service, was asked to admit.  Started on Rocephin empirically for left lower extremity cellulitis.  LFTs and abdominal ultrasound ordered and are pending.  COVID-19 screening test also pending.  ED Course: Temperature 97.9.  BP 131/83, pulse 85, respiratory rate 13, saturation 97% on room air.  Lab studies notable for elevated creatinine 1.72, above normal baseline.  WBC 7.7.  Review of Systems: Review of systems as noted in the HPI. All other systems reviewed and are negative.   Past Medical History:  Diagnosis Date   Cancer [facility])    Colon cancer metastasized to liver (HCC) 01/05/2012   Colon carcinoma (HCC)     colon ca dx 2001;   Diabetes mellitus without complication (HCC)    Hypertension    Muscle weakness-general 01/05/2012   Past Surgical History:  Procedure Laterality Date   CHOLECYSTECTOMY     COLON SURGERY     sigmoid colectomy, appendectomy   INCISIONAL HERNIA REPAIR N/A 11/27/2017   Procedure: LAPAROSCOPIC ASSISTED INCISIONAL HERNIA;  Surgeon: Berna Bue, MD;  Location: [facility];  Service: General;  Laterality: N/A;   INSERTION OF MESH N/A 11/27/2017   Procedure: INSERTION OF MESH;  Surgeon: Berna Bue, MD;  Location: [facility];  Service: General;  Laterality: N/A;   LAPAROSCOPIC LYSIS OF ADHESIONS N/A 11/27/2017   Procedure: LAPAROSCOPIC LYSIS OF ADHESIONS;  Surgeon: Berna Bue, MD;  Location: [facility];  Service: General;  Laterality: N/A;   LIVER SURGERY     partial removal [facility] in 2003   PORT-A-CATH REMOVAL Right 07/16/2012   Procedure: REMOVAL PORT-A-CATH;  Surgeon: Currie Paris, MD;  Location: [facility];  Service: General;  Laterality: Right;   RIGHT/LEFT HEART CATH AND CORONARY ANGIOGRAPHY N/A 04/01/2021   Procedure: RIGHT/LEFT HEART CATH AND CORONARY ANGIOGRAPHY;  Surgeon: Dolores Patty, MD;  Location: [facility];  Service: Cardiovascular;  Laterality: N/A;    Social History:  reports that he quit smoking about 7 years ago. His smoking use included cigarettes. He started smoking about 39 years ago. He has a 16 pack-year smoking history. He has never used smokeless tobacco. He reports that he does not drink alcohol and does not use drugs.   Allergies  Allergen Reactions  Tizanidine     Other Reaction(s): Other (See Comments)  Severe nightmares   Family history: None reported.  Prior to Admission medications   Medication Sig Start Date End Date Taking? Authorizing Provider  amiodarone (PACERONE) 200 MG tablet TAKE 1 TABLET (200 MG TOTAL) BY MOUTH DAILY. TAKE 2 PILLS DAILY FOR 1 WEEK THEN ONCE A DAY Patient taking  differently: Take 200 mg by mouth daily. 11/20/22  Yes Bensimhon, Bevelyn Buckles, MD  ammonium lactate (AMLACTIN) 12 % cream Apply 1 Application topically daily. 05/30/22  Yes [provider]  carvedilol (COREG) 3.125 MG tablet TAKE 1 TABLET BY MOUTH 2 TIMES DAILY. 06/02/22  Yes Milford, Anderson Malta, FNP  cyclobenzaprine (FLEXERIL) 10 MG tablet Take 10 mg by mouth 3 (three) times daily as needed for muscle spasms.   Yes [provider]  digoxin (LANOXIN) 0.125 MG tablet TAKE 1 TABLET BY MOUTH EVERY DAY 11/20/22  Yes Bensimhon, Bevelyn Buckles, MD  empagliflozin (JARDIANCE) 10 MG TABS tablet Take 1 tablet (10 mg total) by mouth daily. 05/19/21  Yes Bensimhon, Bevelyn Buckles, MD  furosemide (LASIX) 40 MG tablet TAKE 1 TABLET (40 MG TOTAL) BY MOUTH DAILY. NEEDS FOLLOW UP APPOINTMENT FOR FURTHER REFILLS 07/28/22  Yes Bensimhon, Bevelyn Buckles, MD  KLOR-CON M20 20 MEQ tablet TAKE 1 TABLET (20 MEQ TOTAL) BY MOUTH DAILY. NEEDS FOLLOW UP APPOINTMENT FOR MORE REFILLS Patient taking differently: Take 20 mEq by mouth daily. 12/29/22  Yes Bensimhon, Bevelyn Buckles, MD  metFORMIN (GLUCOPHAGE-XR) 500 MG 24 hr tablet Take 1,000 mg by mouth 2 (two) times daily. 10/16/17  Yes [provider]  oxyCODONE-acetaminophen (PERCOCET) 7.5-325 MG tablet Take 1 tablet by mouth every 6 (six) hours as needed for severe pain (back pain). 05/26/21  Yes [provider]  rivaroxaban (XARELTO) 20 MG TABS tablet Take 1 tablet by mouth daily. 07/31/22  Yes [provider]  rosuvastatin (CRESTOR) 20 MG tablet Take 20 mg by mouth daily. 03/10/21  Yes [provider]  sacubitril-valsartan (ENTRESTO) 97-103 MG Take 1 tablet by mouth 2 (two) times daily. 05/19/21  Yes Bensimhon, Bevelyn Buckles, MD  Semaglutide, 2 MG/DOSE, (OZEMPIC, 2 MG/DOSE,) 8 MG/3ML SOPN Inject 2 mg into the skin once a week. Dose Increase   Yes [provider]  sildenafil (REVATIO) 20 MG tablet Take 20 mg by mouth daily as needed (ED).   Yes [provider]  spironolactone (ALDACTONE) 25 MG tablet TAKE 1 TABLET (25 MG TOTAL) BY MOUTH DAILY. 11/20/22  Yes Bensimhon, Bevelyn Buckles, MD  TRESIBA FLEXTOUCH 200 UNIT/ML FlexTouch Pen Inject 25 Units into the skin every evening. 03/22/21  Yes [provider]    Physical Exam: BP 121/79   Pulse 85   Temp 97.9 F (36.6 C) (Oral)   Resp 17   Ht 6' (1.829 m)   Wt 109.3 kg   SpO2 97%   BMI 32.68 kg/m   General: 62 y.o. year-old male weak appearing in no acute distress.  Alert and oriented x3. Cardiovascular: Regular rate and rhythm with no rubs or gallops.  No thyromegaly or JVD noted.  2/4 pulses in all 4 extremities. Respiratory: Clear to auscultation with no wheezes or rales. Good inspiratory effort. Abdomen: Soft nontender nondistended with normal bowel sounds x4 quadrants. Muskuloskeletal: No cyanosis or clubbing noted bilaterally Neuro: CN II-XII intact, strength, sensation, reflexes Skin: Left lower extremity hematoma and cellulitic lesion. Psychiatry: Judgement and insight appear normal. Mood is appropriate for condition and setting  Labs on Admission:  Basic Metabolic Panel: Recent Labs  Lab 01/21/23 1425 01/21/23 1434  NA 137 138  K 4.1 4.0  CL 97* 100  CO2 24  --   GLUCOSE 110* 107*  BUN 16 19  CREATININE 1.72* 1.80*  CALCIUM 9.4  --    Liver Function Tests: No results for input(s): "AST", "ALT", "ALKPHOS", "BILITOT", "PROT", "ALBUMIN" in the last 168 hours. No results for input(s): "LIPASE", "AMYLASE" in the last 168 hours. No results for input(s): "AMMONIA" in the last 168 hours. CBC: Recent Labs  Lab 01/21/23 1425 01/21/23 1434  WBC 7.7  --   NEUTROABS 4.7  --   HGB 14.7 16.3  HCT 47.3 48.0  MCV 82.7  --   PLT 286  --    Cardiac Enzymes: No results for input(s): "CKTOTAL", "CKMB", "CKMBINDEX", "TROPONINI" in the last 168 hours.  BNP (last 3 results) No results for input(s): "BNP" in the last 8760 hours.  ProBNP (last 3 results) No results  for input(s): "PROBNP" in the last 8760 hours.  CBG: No results for input(s): "GLUCAP" in the last 168 hours.  Radiological Exams on Admission: CT ANGIO LOWER EXT BILAT W &/OR WO CONTRAST  Result Date: 01/21/2023 CLINICAL DATA:  Lower extremity vascular trauma. Laceration and hematoma of the lateral aspect of the left calf. Patient on anticoagulation. EXAM: CT ANGIOGRAPHY OF ABDOMINAL AORTA WITH ILIOFEMORAL RUNOFF TECHNIQUE: Multidetector CT imaging of the abdomen, pelvis and lower extremities was performed using the standard protocol during bolus administration of intravenous contrast. Multiplanar CT image reconstructions and MIPs were obtained to evaluate the vascular anatomy. RADIATION DOSE REDUCTION: This exam was performed according to the departmental dose-optimization program which includes automated exposure control, adjustment of the mA and/or kV according to patient size and/or use of iterative reconstruction technique. CONTRAST:  OMNIPAQUE IOHEXOL 350 MG/ML SOLN COMPARISON:  None Available. FINDINGS: VASCULAR Aorta: Moderate atherosclerotic calcification of the visualized distal abdominal aorta. Celiac: Not included in the study. SMA: Not included in the images. Renals: Not included in the images. IMA: Not well opacified. RIGHT Lower Extremity Inflow: Mild atherosclerotic calcification. No aneurysmal dilatation or dissection. The iliac arteries are patent. Outflow: Common, superficial and profunda femoral arteries and the popliteal artery are patent without evidence of aneurysm, dissection, vasculitis or significant stenosis. Runoff: The anterior tibial arteries patent to the level of the ankle. The posterior tibial and fibular arteries are patent to the distal calf. Evaluation of the distal segments of these vessels is limited due to contrast washout. LEFT Lower Extremity Inflow: Mild atherosclerotic calcification. No aneurysmal dilatation or dissection. The iliac arteries are patent.  Outflow: Common, superficial and profunda femoral arteries and the popliteal artery are patent without evidence of aneurysm, dissection, vasculitis or significant stenosis. Runoff: Patent three vessel runoff to the ankle. No traumatic arterial injury or evidence of active arterial bleed. Veins: No obvious venous abnormality within the limitations of this arterial phase study. Review of the MIP images confirms the above findings. NON-VASCULAR Lower chest: Not included. Hepatobiliary: Not included Pancreas: Not included Spleen: Not included. Adrenals/Urinary Tract: The kidneys are not included. The urinary bladder is unremarkable. Stomach/Bowel: Moderate stool in the visualized colon. No abnormal bowel loops in the pelvis. Lymphatic: No pelvic adenopathy. Reproductive: The prostate and seminal vesicles are grossly unremarkable. Other: There is laceration of the skin of the lateral aspect the distal left lower extremity. There is a 4.0 x 8.5 by 15.5 cm hematoma in the superficial soft tissues of the  lateral distal left lower extremity. No contrast extravasation or evidence of active bleed. Musculoskeletal: No acute osseous pathology. Small left suprapatellar effusion. IMPRESSION: 1. No traumatic arterial injury or evidence of active arterial bleed. 2. Large subcutaneous hematoma in the superficial soft tissues of the lateral distal left lower extremity. No contrast extravasation or evidence of active bleed. 3.  Aortic Atherosclerosis (ICD10-I70.0). Electronically Signed   By: Elgie Collard M.D.   On: 01/21/2023 18:58   DG Ankle Left Port  Result Date: 01/21/2023 CLINICAL DATA:  Pain after injury EXAM: PORTABLE LEFT ANKLE - 2 VIEW; PORTABLE LEFT TIBIA AND FIBULA - 2 VIEW COMPARISON:  None Available. FINDINGS: There is no evidence of fracture, dislocation, or joint effusion. There is no evidence of arthropathy or other focal bone abnormality. Soft tissue swelling about the ankle. There also a lobular area of soft  tissue thickening posterolateral to the distal tib-fib region. Please correlate with the etiology including possible hematoma versus mass lesion. IMPRESSION: No fracture or dislocation. Soft tissue swelling about the ankle. There also a lobular area of soft tissue thickening posterolateral to the distal tib-fib region. Please correlate with the etiology including possible hematoma versus mass lesion. Additional workup as clinically appropriate or follow up to confirm resolution. Electronically Signed   By: Karen Kays M.D.   On: 01/21/2023 15:48   DG Tibia/Fibula Left Port  Result Date: 01/21/2023 CLINICAL DATA:  Pain after injury EXAM: PORTABLE LEFT ANKLE - 2 VIEW; PORTABLE LEFT TIBIA AND FIBULA - 2 VIEW COMPARISON:  None Available. FINDINGS: There is no evidence of fracture, dislocation, or joint effusion. There is no evidence of arthropathy or other focal bone abnormality. Soft tissue swelling about the ankle. There also a lobular area of soft tissue thickening posterolateral to the distal tib-fib region. Please correlate with the etiology including possible hematoma versus mass lesion. IMPRESSION: No fracture or dislocation. Soft tissue swelling about the ankle. There also a lobular area of soft tissue thickening posterolateral to the distal tib-fib region. Please correlate with the etiology including possible hematoma versus mass lesion. Additional workup as clinically appropriate or follow up to confirm resolution. Electronically Signed   By: Karen Kays M.D.   On: 01/21/2023 15:48    EKG: I independently viewed the EKG done and my findings are as followed: Sinus rhythm rate of 88.  Nonspecific ST-T changes.  QTc 429.  Assessment/Plan Present on Admission:  Cellulitis  Principal Problem:   Cellulitis  Left lower extremity cellulitis, POA Presented with left lower extremity hematoma, above hematoma is noted edema, erythema, warmth, tenderness with minimal palpation. Started on Rocephin due to  concern for cellulitis Obtain MRSA screening test, follow blood cultures x 2 Monitor vital signs  Left lower extremity hematoma post fall, confirmed on CT scan The patient is on DOAC, last dose of Xarelto was on 01/20/2023. Hold off DOAC due to hematoma Received a tetanus shot in the ED Orthopedic surgery consulted by EDP, will see in consultation. DVT prophylaxis: SCDs, home Xarelto on hold due to left lower extremity hematoma.  Will be seen by orthopedic surgery on 01/22/2023.  Defer DVT prophylaxis to orthopedic surgery due to possible procedure. As needed analgesics  Right upper and right lower abdominal pain x 2 days History of colon cancer with metastasis to the liver status post left hepatectomy and cholecystectomy Obtain complete abdominal ultrasound, follow results As needed analgesics and bowel regimen as needed  AKI, suspect prerenal in the setting of use of diuretics Baseline creatinine appears  to be 1.1 with GFR greater than 60 Presented with creatinine of 1.72 with GFR 44 Avoid nephrotoxic agents, dehydration and hypotension Hold off home diuretics and Entresto Monitor urine output Repeat chemistry panel in the morning  Dysuria, rule out UTI Urine analysis and urine culture are pending The patient is currently on Rocephin for left lower extremity cellulitis, continue Follow urine analysis and urine culture  Paroxysmal A-fib on Eliquis Hold off home Xarelto due to hematoma Resume home Coreg for rate control The patient is also on amiodarone and digoxin prior to admission, resume  HFrEF 30 to 35% Euvolemic on exam Home Entresto, Lasix, and spironolactone held due to AKI Start strict I's and O's and daily weight  Type 2 diabetes with hyperlipidemia Last hemoglobin A1c 7.4 on 12/30/2021 Start insulin sliding scale  Hyperlipidemia Resume home Crestor  Generalized weakness with fall Suspect multifactorial Obtain orthostatic vital signs Follow COVID-19 screening  test, pending at the time of this dictation PT OT evaluation Fall precautions.   Critical care time: 75 minutes.   DVT prophylaxis: SCDs, home Xarelto on hold due to left lower extremity hematoma.  Will be seen by orthopedic surgery on 01/22/2023.  Defer DVT prophylaxis to orthopedic surgery due to possible procedure.  Code Status: Full code.  Family Communication: None at bedside.  Disposition Plan: Admitted to telemetry surgical unit  Consults called: Orthopedic surgery.  Admission status: Inpatient status.   Status is: Inpatient The patient requires at least 2 midnights for further evaluation and treatment of present condition.   Darlin Drop MD [facility] Pager [phone]  If 7PM-7AM, please contact night-coverage [email or web address] Password [email or web address]  01/21/2023, 9:06 PM

## 2023-01-21 NOTE — Progress Notes (Signed)
Discussed case with ER provider, patient with acute kidney injury, creatinine 1.8 as compared to 1.1 a year ago, on Xarelto, with acute hematoma over the lower extremity.  The extremity hematoma is sizable enough that it would likely undergo necrosis, in my experience evacuation of the hematoma does not preclude this, and he is likely to get some degree of skin necrosis given the soft tissue injury.  Will likely need surgery this week.    Full consult to follow.  Dr. Lajoyce Corners to see in am. Surgery likely on wed.  Optimize medically in the meantime, appreciate the hospitalists help.    Eulas Post, MD

## 2023-01-21 NOTE — ED Notes (Signed)
Pt still hypoglycemic after snacks. Given bagged food and more juice. Hospitalist consulted and made aware for the need of IV meds. Awaiting response.

## 2023-01-21 NOTE — ED Provider Notes (Addendum)
[place] EMERGENCY DEPARTMENT AT [facility] Provider Note   CSN: [id number] Arrival date & time: 01/21/23  1408     History  Chief Complaint  Patient presents with   Injury    Raymond Allen is a 62 y.o. male.  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  Yesterday patient had a trailer carrying a boat, back into his leg.  He sustained a laceration. He had subsequent swelling but was ambulatory.  Today patient continued to have swelling and some bleeding when he tried to walk on the leg, prompting emergency department visit.  Last took blood thinner last night.       Home Medications Prior to Admission medications   Medication Sig Start Date End Date Taking? Authorizing Provider  amiodarone (PACERONE) 200 MG tablet TAKE 1 TABLET (200 MG TOTAL) BY MOUTH DAILY. TAKE 2 PILLS DAILY FOR 1 WEEK THEN ONCE A DAY 11/20/22   Bensimhon, Bevelyn Buckles, MD  carvedilol (COREG) 3.125 MG tablet TAKE 1 TABLET BY MOUTH 2 TIMES DAILY. 06/02/22   [place]ford, Anderson Malta, FNP  cyclobenzaprine (FLEXERIL) 10 MG tablet Take 10 mg by mouth 3 (three) times daily as needed for muscle spasms. 08/12/13   [provider]  digoxin (LANOXIN) 0.125 MG tablet TAKE 1 TABLET BY MOUTH EVERY DAY 11/20/22   Bensimhon, Bevelyn Buckles, MD  empagliflozin (JARDIANCE) 10 MG TABS tablet Take 1 tablet (10 mg total) by mouth daily. 05/19/21   Bensimhon, Bevelyn Buckles, MD  enoxaparin (LOVENOX) 120 MG/0.8ML injection Inject 0.8 mLs (120 mg total) into the skin 2 (two) times daily. 12/31/21 03/01/22  Erick Blinks, MD  furosemide (LASIX) 40 MG tablet TAKE 1 TABLET (40 MG TOTAL) BY MOUTH DAILY. NEEDS FOLLOW UP  APPOINTMENT FOR FURTHER REFILLS 07/28/22   Bensimhon, Bevelyn Buckles, MD  gabapentin (NEURONTIN) 300 MG capsule Take 300 mg by mouth 2 (two) times daily. 10/23/17   [provider]  KLOR-CON M20 20 MEQ tablet TAKE 1 TABLET (20 MEQ TOTAL) BY MOUTH DAILY. NEEDS FOLLOW UP APPOINTMENT FOR MORE REFILLS 12/29/22   Bensimhon, Bevelyn Buckles, MD  metFORMIN (GLUCOPHAGE-XR) 500 MG 24 hr tablet Take 1,000 mg by mouth 2 (two) times daily. 10/16/17   [provider]  oxyCODONE-acetaminophen (PERCOCET) 7.5-325 MG tablet Take 1 tablet by mouth every 6 (six) hours as needed for severe pain (back pain). Patient not taking: Reported on 01/08/2023 05/26/21   [provider]  OZEMPIC, 1 MG/DOSE, 4 MG/3ML SOPN Inject 1 mg into the skin every Monday. 02/04/21   [provider]  rosuvastatin (CRESTOR) 20 MG tablet Take 20 mg by mouth daily. 03/10/21   [provider]  sacubitril-valsartan (ENTRESTO) 97-103 MG Take 1 tablet by mouth 2 (two) times daily. 05/19/21   Bensimhon, Bevelyn Buckles, MD  sildenafil (REVATIO) 20 MG tablet Take 20 mg by mouth daily as needed (ED).    [provider]  spironolactone (ALDACTONE) 25 MG tablet TAKE 1 TABLET (25 MG TOTAL) BY MOUTH DAILY. 11/20/22   Bensimhon, Bevelyn Buckles, MD  TRESIBA FLEXTOUCH 200 UNIT/ML FlexTouch Pen Inject 30 Units into the skin every evening. 03/22/21   [provider]      Allergies    Patient  has no known allergies.    Review of Systems   Review of Systems  Physical Exam Updated Vital Signs BP (!) 113/97 (BP Location: Left Arm)   Pulse 91   Temp 97.7 F (36.5 C) (Oral)   Resp (!) 21   SpO2 98%  Physical Exam Vitals and nursing note reviewed.  Constitutional:      Appearance: He is well-developed.  HENT:     Head: Normocephalic and atraumatic.  Eyes:     Conjunctiva/sclera: Conjunctivae normal.  Cardiovascular:     Pulses:          Dorsalis pedis pulses are 2+ on the right side and 2+ on the left side.       Posterior  tibial pulses are 2+ on the right side and 2+ on the left side.  Pulmonary:     Effort: No respiratory distress.  Musculoskeletal:     Cervical back: Normal range of motion and neck supple.     Comments: Left lower extremity: There is approximately 2 cm circular wound noted over the lateral aspect of the lower leg.  Some subcutaneous tissue noted.  This is raised by a firm hematoma.  Remainder of the calf is soft.  There are a couple small blisters on the anterior leg, 1 which is deroofed.  Skin:    General: Skin is warm and dry.  Neurological:     Mental Status: He is alert.            ED Results / Procedures / Treatments   Labs (all labs ordered are listed, but only abnormal results are displayed) Labs Reviewed  CBC WITH DIFFERENTIAL/PLATELET - Abnormal; Notable for the following components:      Result Value   MCH 25.7 (*)    All other components within normal limits  BASIC METABOLIC PANEL - Abnormal; Notable for the following components:   Chloride 97 (*)    Glucose, Bld 110 (*)    Creatinine, Ser 1.72 (*)    GFR, Estimated 44 (*)    Anion gap 16 (*)    All other components within normal limits  PROTIME-INR - Abnormal; Notable for the following components:   Prothrombin Time 16.3 (*)    INR 1.3 (*)    All other components within normal limits  I-STAT CHEM 8, ED - Abnormal; Notable for the following components:   Creatinine, Ser 1.80 (*)    Glucose, Bld 107 (*)    Calcium, Ion 1.11 (*)    All other components within normal limits  CK  TYPE AND SCREEN  ABO/RH    EKG None  Radiology No results found.  Procedures Procedures    Medications Ordered in ED Medications  morphine (PF) 4 MG/ML injection 6 mg (has no administration in time range)  ondansetron (ZOFRAN) injection 4 mg (has no administration in time range)  sodium chloride 0.9 % bolus 1,000 mL (0 mLs Intravenous Stopped 01/21/23 1650)  Tdap (BOOSTRIX) injection 0.5 mL (0.5 mLs Intramuscular Given  01/21/23 1500)  iohexol (OMNIPAQUE) 350 MG/ML injection 100 mL (100 mLs Intravenous Contrast Given 01/21/23 1653)    ED Course/ Medical Decision Making/ A&P    Patient seen and examined. History obtained directly from patient and wife.  Originally seen in triage room.  After removal of bandage, patient had pulsatile bleeding from the wound site.  There was a small puddle, less than 20 cc of blood, noted on the floor.  Patient became diaphoretic after the bleeding started, likely vasovagal.  He never had full syncope.  Recovered upon being placed into a bed in the exam room.  Bleeding currently controlled after application of pressure for about 15 minutes.  Labs/EKG: Ordered CBC, BMP, PT/INR, type and screen, CK, EKG.  Imaging: Ordered portable left tib-fib, ankle.  Medications/Fluids: Ordered: IV fluid bolus.   Most recent vital signs reviewed and are as follows: BP (!) 113/97 (BP Location: Left Arm)   Pulse 91   Temp 97.7 F (36.5 C) (Oral)   Resp (!) 21   SpO2 98%   Initial impression: Likely arteriolar bleeding from left lower extremity laceration.  Patient appears to be hemodynamically stable.  He has recovered from what was likely a vasovagal spell after his bleeding worsened.  Controlled with direct pressure.  Workup initiated.  Awaiting x-rays.  7:45 PM Reassessment performed multiple times in interim. Patient appears stable at he continues to have 2+ DP pulse.  Wound is bandaged currently.  Labs personally reviewed and interpreted including: CBC with normal white blood cell count, normal hemoglobin; BMP with elevated from baseline creatinine of 1.7 to, glucose 110 otherwise unremarkable; PT/INR ordered secondary to anticoagulation and active bleeding was 1.3.  Imaging personally visualized and interpreted including: X-rays of lower leg agree negative.  CT scan of the lower extremity shows large hematoma, no arterial injury.  Reviewed pertinent lab work and imaging with patient at  bedside. Questions answered.   Most current vital signs reviewed and are as follows: BP 121/79   Pulse 85   Temp 97.7 F (36.5 C) (Oral)   Resp (!) 21   Ht 6' (1.829 m)   Wt 109.3 kg   SpO2 97%   BMI 32.68 kg/m   Plan: Admission to hospital.  I have spoken with Dr. Dion Saucier who is on-call who is discussed case as well with Dr. Lajoyce Corners.  They recommend admission for monitoring of symptoms.  Dr. Dion Saucier states that surgery is unlikely tomorrow, likely later in the week.  Will discuss with hospitalist service.  7:58 PM Case discussed with Dr. Margo Aye.                                  Medical Decision Making Amount and/or Complexity of Data Reviewed Labs: ordered. Radiology: ordered.  Risk Prescription drug management. Decision regarding hospitalization.   Left lower extremity hematoma on anticoagulation.  Does have some anterior bullae.  Wound is left open.  No signs of active infection.  There was some bleeding on arrival with vasovagal episode, however this has improved.  Patient will be admitted for renal function monitoring as well as wound monitoring, likely surgical procedure by orthopedics.        Final Clinical Impression(s) / ED Diagnoses Final diagnoses:  Hematoma of left lower extremity, initial encounter  Laceration of left lower extremity, initial encounter  AKI (acute kidney injury) [facility])    Rx / DC Orders ED Discharge Orders     None         Renne Crigler, PA-C 01/21/23 1948    Renne Crigler, PA-C 01/21/23 1958    Ernie Avena, MD 01/21/23 2003

## 2023-01-21 NOTE — ED Notes (Signed)
Report received from [facility]. RN. Assumed care of pt at this time.

## 2023-01-21 NOTE — ED Notes (Signed)
Provider at bedside

## 2023-01-21 NOTE — ED Notes (Signed)
Pt diaphoretic and pale in triage. Pt had brief episode of unresponsive in triage.

## 2023-01-21 NOTE — ED Notes (Signed)
Pt given food and beverage to increase blood glucose. Will recheck blood sugar in about 30 mins.

## 2023-01-21 NOTE — ED Notes (Signed)
X-ray at bedside

## 2023-01-21 NOTE — ED Triage Notes (Signed)
Pt to ED POV. Pt states a trailer with a boat on it rolled over his left leg yesterday. Pt is on eliquis. + open deformity. Pt bleeding through towel.

## 2023-01-22 ENCOUNTER — Encounter (HOSPITAL_COMMUNITY): Payer: Self-pay | Admitting: Internal Medicine

## 2023-01-22 DIAGNOSIS — S81812A Laceration without foreign body, left lower leg, initial encounter: Secondary | ICD-10-CM

## 2023-01-22 DIAGNOSIS — N179 Acute kidney failure, unspecified: Secondary | ICD-10-CM | POA: Diagnosis not present

## 2023-01-22 DIAGNOSIS — L0390 Cellulitis, unspecified: Secondary | ICD-10-CM | POA: Diagnosis present

## 2023-01-22 DIAGNOSIS — S8012XA Contusion of left lower leg, initial encounter: Secondary | ICD-10-CM | POA: Diagnosis not present

## 2023-01-22 LAB — CBG MONITORING, ED
Glucose-Capillary: 106 mg/dL — ABNORMAL HIGH (ref 70–99)
Glucose-Capillary: 113 mg/dL — ABNORMAL HIGH (ref 70–99)
Glucose-Capillary: 84 mg/dL (ref 70–99)
Glucose-Capillary: 85 mg/dL (ref 70–99)
Glucose-Capillary: 97 mg/dL (ref 70–99)

## 2023-01-22 LAB — HEMOGLOBIN A1C
Hgb A1c MFr Bld: 6.1 % — ABNORMAL HIGH (ref 4.8–5.6)
Mean Plasma Glucose: 128.37 mg/dL

## 2023-01-22 LAB — COMPREHENSIVE METABOLIC PANEL
ALT: 10 U/L (ref 0–44)
AST: 17 U/L (ref 15–41)
Albumin: 3.6 g/dL (ref 3.5–5.0)
Alkaline Phosphatase: 47 U/L (ref 38–126)
Anion gap: 12 (ref 5–15)
BUN: 14 mg/dL (ref 8–23)
CO2: 26 mmol/L (ref 22–32)
Calcium: 9.1 mg/dL (ref 8.9–10.3)
Chloride: 99 mmol/L (ref 98–111)
Creatinine, Ser: 1.54 mg/dL — ABNORMAL HIGH (ref 0.61–1.24)
GFR, Estimated: 50 mL/min — ABNORMAL LOW (ref >60)
Glucose, Bld: 102 mg/dL — ABNORMAL HIGH (ref 70–99)
Potassium: 4.4 mmol/L (ref 3.5–5.1)
Sodium: 137 mmol/L (ref 135–145)
Total Bilirubin: 2.3 mg/dL — ABNORMAL HIGH (ref 0.3–1.2)
Total Protein: 7.5 g/dL (ref 6.5–8.1)

## 2023-01-22 LAB — URINALYSIS, W/ REFLEX TO CULTURE (INFECTION SUSPECTED)
Bilirubin Urine: NEGATIVE
Glucose, UA: >=500 mg/dL — AB
Hgb urine dipstick: NEGATIVE
Ketones, ur: 5 mg/dL — AB
Nitrite: NEGATIVE
Protein, ur: NEGATIVE mg/dL
Specific Gravity, Urine: 1.045 — ABNORMAL HIGH (ref 1.005–1.030)
pH: 6 (ref 5.0–8.0)

## 2023-01-22 LAB — CBC
HCT: 47.3 % (ref 39.0–52.0)
Hemoglobin: 14.2 g/dL (ref 13.0–17.0)
MCH: 25 pg — ABNORMAL LOW (ref 26.0–34.0)
MCHC: 30 g/dL (ref 30.0–36.0)
MCV: 83.4 fL (ref 80.0–100.0)
Platelets: 247 10*3/uL (ref 150–400)
RBC: 5.67 MIL/uL (ref 4.22–5.81)
RDW: 14.1 % (ref 11.5–15.5)
WBC: 7.6 10*3/uL (ref 4.0–10.5)
nRBC: 0 % (ref 0.0–0.2)

## 2023-01-22 LAB — PHOSPHORUS: Phosphorus: 4.2 mg/dL (ref 2.5–4.6)

## 2023-01-22 LAB — BRAIN NATRIURETIC PEPTIDE: B Natriuretic Peptide: 12.3 pg/mL (ref 0.0–100.0)

## 2023-01-22 LAB — HIV ANTIBODY (ROUTINE TESTING W REFLEX): HIV Screen 4th Generation wRfx: NONREACTIVE

## 2023-01-22 LAB — GLUCOSE, CAPILLARY
Glucose-Capillary: 93 mg/dL (ref 70–99)
Glucose-Capillary: 88 mg/dL (ref 70–99)

## 2023-01-22 LAB — HEPATIC FUNCTION PANEL
ALT: 12 U/L (ref 0–44)
AST: 17 U/L (ref 15–41)
Albumin: 3.6 g/dL (ref 3.5–5.0)
Alkaline Phosphatase: 48 U/L (ref 38–126)
Bilirubin, Direct: 0.4 mg/dL — ABNORMAL HIGH (ref 0.0–0.2)
Indirect Bilirubin: 1.9 mg/dL — ABNORMAL HIGH (ref 0.3–0.9)
Total Bilirubin: 2.3 mg/dL — ABNORMAL HIGH (ref 0.3–1.2)
Total Protein: 7.5 g/dL (ref 6.5–8.1)

## 2023-01-22 LAB — CK: Total CK: 221 U/L (ref 49–397)

## 2023-01-22 LAB — HEMOGLOBIN AND HEMATOCRIT, BLOOD
HCT: 40.3 % (ref 39.0–52.0)
Hemoglobin: 12.4 g/dL — ABNORMAL LOW (ref 13.0–17.0)

## 2023-01-22 LAB — MAGNESIUM: Magnesium: 2 mg/dL (ref 1.7–2.4)

## 2023-01-22 MED ORDER — DEXTROSE 10 % IV SOLN: INTRAVENOUS | Status: DC

## 2023-01-22 MED ORDER — INSULIN ASPART 100 UNIT/ML IJ SOLN: 0.0000 [IU] | Freq: Every day | INTRAMUSCULAR | Status: DC

## 2023-01-22 MED ORDER — HYDROMORPHONE HCL 1 MG/ML IJ SOLN
1.0000 mg | INTRAMUSCULAR | Status: DC | PRN
  Administered 2023-01-23: 2 mg via INTRAVENOUS
  Administered 2023-01-24: 1 mg via INTRAVENOUS
  Filled 2023-01-22: qty 2
  Filled 2023-01-22: qty 1

## 2023-01-22 MED ORDER — NALOXONE HCL 0.4 MG/ML IJ SOLN: 0.4000 mg | INTRAMUSCULAR | Status: DC | PRN

## 2023-01-22 MED ORDER — HYDROCODONE-ACETAMINOPHEN 5-325 MG PO TABS: 1.0000 | ORAL_TABLET | ORAL | Status: DC | PRN

## 2023-01-22 MED ORDER — OXYCODONE-ACETAMINOPHEN 7.5-325 MG PO TABS
1.0000 | ORAL_TABLET | ORAL | Status: DC | PRN
  Administered 2023-01-22 – 2023-01-24 (×4): 1 via ORAL
  Filled 2023-01-22 (×4): qty 1

## 2023-01-22 MED ORDER — PNEUMOCOCCAL 20-VAL CONJ VACC 0.5 ML IM SUSY
0.5000 mL | PREFILLED_SYRINGE | INTRAMUSCULAR | Status: DC
  Filled 2023-01-22: qty 0.5

## 2023-01-22 MED ORDER — OXYCODONE-ACETAMINOPHEN 5-325 MG PO TABS
1.0000 | ORAL_TABLET | ORAL | Status: DC | PRN
Start: 2023-01-22 — End: 2023-01-22

## 2023-01-22 MED ORDER — INSULIN ASPART 100 UNIT/ML IJ SOLN
0.0000 [IU] | Freq: Three times a day (TID) | INTRAMUSCULAR | Status: DC
  Administered 2023-01-26: 2 [IU] via SUBCUTANEOUS

## 2023-01-22 NOTE — Progress Notes (Signed)
OT Cancellation Note  Patient Details Name: Raymond Allen MRN: [id number] DOB: [birth year]   Cancelled Treatment:    Reason Eval/Treat Not Completed: Other (comment).  OT order placed for 10/10 after surgery 10/9.  OT will continue efforts as appropriate.    Serenitie Vinton D Isabellarose Kope 01/22/2023, 1:01 PM 01/22/2023  RP, OTR/L  Acute Rehabilitation Services  Office:  [phone]

## 2023-01-22 NOTE — Progress Notes (Signed)
PROGRESS NOTE    Raymond Allen  UJW:[id number] DOB: Jun 25, 1960 DOA: 01/21/2023 PCP: [place], Julie M, NP    Brief Narrative:  Raymond Allen is a 62 y.o. male with medical history significant for paroxysmal A-fib on Eliquis, HFrEF 30-35%, history of colon cancer with metastasis to the liver status post left hepatectomy, cholecystectomy, type 2 diabetes, who presents to the ED from home after feeling poorly for the past 2 days, generalized weakness, and a fall.  He landed on his left lower extremity hitting the side of his trailer.  Plan for debridement by Dr.  Lajoyce Corners on Wednesday.   Assessment and Plan: Left lower extremity cellulitis, POA Presented with left lower extremity hematoma, above hematoma is noted edema, erythema, warmth, tenderness with minimal palpation. Started on Rocephin due to concern for cellulitis Obtain MRSA screening test, follow blood cultures x 2 -will need progressive bed for now   Left lower extremity hematoma post fall, confirmed on CT scan The patient is on DOAC, last dose of Xarelto was on 01/20/2023. Hold off DOAC due to hematoma Received a tetanus shot in the ED Orthopedic surgery Lajoyce Corners) : Will plan for debridement of the hematoma on Wednesday.  -PRN pain meds   Right upper and right lower abdominal pain x 2 days U/S shows: Indeterminate 5.8 cm mass along the resection margin of the right hepatic lobe. CT abdomen pelvis with IV contrast is recommended for further evaluation -will get once Cr stable -Hgb stable-- but if drops low threshold to get scan to evaluate for bleeding   AKI, suspect prerenal in the setting of use of diuretics Baseline creatinine appears to be 1.1 with GFR greater than 60 Avoid nephrotoxic agents, dehydration and hypotension Hold off home diuretics and Entresto   Dysuria, rule out UTI The patient is currently on Rocephin for left lower extremity cellulitis, continue   Paroxysmal A-fib on Eliquis Hold  Xarelto due to  hematoma Resume home Coreg for rate control - on amiodarone and digoxin prior to admission, resume   HFrEF 30 to 35% - Entresto, Lasix, and spironolactone held due to AKI Start strict I's and O's and daily weight   Type 2 diabetes with hyperlipidemia/hypoglycemia -SSI -apparently started on D10 in the ER-- will work to d/c this today -blood sugar low when he presented to the ER   Hyperlipidemia - Crestor   Generalized weakness with fall -multifactorial PT OT evaluation after surgery Fall precautions.   DVT prophylaxis: SCDs Start: 01/21/23 2059    Code Status: Full Code   Disposition Plan:  Level of care: Progressive Status is: Inpatient Remains inpatient appropriate     Consultants:  ortho   Subjective: Having a lot of pain in his lower leg  Objective: Vitals:   01/22/23 0400 01/22/23 0500 01/22/23 0600 01/22/23 0757  BP: 114/75 108/73 100/77   Pulse: 91 94 92   Resp: 20 15 (!) 28   Temp: 98.2 F (36.8 C)   98.3 F (36.8 C)  TempSrc: Oral     SpO2: 99% 92% 94%   Weight:      Height:        Intake/Output Summary (Last 24 hours) at 01/22/2023 0804 Last data filed at 01/22/2023 0235 Gross per 24 hour  Intake 1100 ml  Output 575 ml  Net 525 ml   Filed Weights   01/21/23 1810  Weight: 109.3 kg    Examination:   General: Appearance:    Obese male who appears uncomfortable  Lungs:     respirations unlabored  Heart:    Normal heart rate. .    MS:  .       Neurologic:   Awake, alert       Data Reviewed: I have personally reviewed following labs and imaging studies  CBC: Recent Labs  Lab 01/21/23 1425 01/21/23 1434 01/22/23 0114  WBC 7.7  --  7.6  NEUTROABS 4.7  --   --   HGB 14.7 16.3 14.2  HCT 47.3 48.0 47.3  MCV 82.7  --  83.4  PLT 286  --  247   Basic Metabolic Panel: Recent Labs  Lab 01/21/23 1425 01/21/23 1434 01/22/23 0114  NA 137 138 137  K 4.1 4.0 4.4  CL 97* 100 99  CO2 24  --  26  GLUCOSE 110* 107* 102*   BUN 16 19 14   CREATININE 1.72* 1.80* 1.54*  CALCIUM 9.4  --  9.1  MG  --   --  2.0  PHOS  --   --  4.2   GFR: Estimated Creatinine Clearance: 61.9 mL/min (A) (by C-G formula based on SCr of 1.54 mg/dL (H)). Liver Function Tests: Recent Labs  Lab 01/22/23 0114  AST 17  17  ALT 12  10  ALKPHOS 48  47  BILITOT 2.3*  2.3*  PROT 7.5  7.5  ALBUMIN 3.6  3.6   No results for input(s): "LIPASE", "AMYLASE" in the last 168 hours. No results for input(s): "AMMONIA" in the last 168 hours. Coagulation Profile: Recent Labs  Lab 01/21/23 1425  INR 1.3*   Cardiac Enzymes: Recent Labs  Lab 01/21/23 2057  CKTOTAL 221   BNP (last 3 results) No results for input(s): "PROBNP" in the last 8760 hours. HbA1C: Recent Labs    01/22/23 0114  HGBA1C 6.1*   CBG: Recent Labs  Lab 01/21/23 2257 01/22/23 0046 01/22/23 0211 01/22/23 0351 01/22/23 0753  GLUCAP 51* 106* 84 85 97   Lipid Profile: No results for input(s): "CHOL", "HDL", "LDLCALC", "TRIG", "CHOLHDL", "LDLDIRECT" in the last 72 hours. Thyroid Function Tests: No results for input(s): "TSH", "T4TOTAL", "FREET4", "T3FREE", "THYROIDAB" in the last 72 hours. Anemia Panel: No results for input(s): "VITAMINB12", "FOLATE", "FERRITIN", "TIBC", "IRON", "RETICCTPCT" in the last 72 hours. Sepsis Labs: No results for input(s): "PROCALCITON", "LATICACIDVEN" in the last 168 hours.  Recent Results (from the past 240 hour(s))  MRSA Next Gen by PCR, Nasal     Status: None   Collection Time: 01/21/23  8:57 PM   Specimen: Nasal Mucosa; Nasal Swab  Result Value Ref Range Status   MRSA by PCR Next Gen NOT DETECTED NOT DETECTED Final    Comment: (NOTE) The GeneXpert MRSA Assay (FDA approved for NASAL specimens only), is one component of a comprehensive MRSA colonization surveillance program. It is not intended to diagnose MRSA infection nor to guide or monitor treatment for MRSA infections. Test performance is not FDA approved in  patients less than 61 years old. Performed at [facility], [address] [place], [place] [address]   SARS Coronavirus 2 by RT PCR (hospital order, performed in [facility] hospital lab) *cepheid single result test* Anterior Nasal Swab     Status: None   Collection Time: 01/21/23 10:02 PM   Specimen: Anterior Nasal Swab  Result Value Ref Range Status   SARS Coronavirus 2 by RT PCR NEGATIVE NEGATIVE Final    Comment: Performed at [facility], [address] [place], [place]  [address]         Radiology Studies: US Abdomen Complete  Result Date: 01/21/2023 CLINICAL DATA:  Abdominal pain. History of cholecystectomy and left hepatectomy. Colon cancer with metastases to the liver. EXAM: ABDOMEN ULTRASOUND COMPLETE COMPARISON:  CT abdomen and pelvis 02/01/2022 FINDINGS: Gallbladder: Cholecystectomy. Common bile duct: Diameter: 4 mm.  No intrahepatic biliary dilation. Liver: Left hepatectomy. 5.8 x 5.3 x 4.4 cm solid heterogenous mass in the right hepatic lobe adjacent to the surgical margin of the left hepatectomy. Increased parenchymal echogenicity. Portal vein is patent on color Doppler imaging with normal direction of blood flow towards the liver. IVC: Not visualized. Pancreas: Not visualized. Spleen: Size and appearance within normal limits.  9.1 cm. Right Kidney: Length: 10.3 cm. Echogenicity within normal limits. No mass or hydronephrosis visualized. Left Kidney: Length: 10.6 cm. Echogenicity within normal limits. No mass or hydronephrosis visualized. Abdominal aorta: No aneurysm visualized. The mid and distal aorta were not visualized. Other findings: Midline structures not visualized due to bowel gas. IMPRESSION: Indeterminate 5.8 cm mass along the resection margin of the right hepatic lobe. CT abdomen pelvis with IV contrast is recommended for further evaluation. Electronically Signed   By: Minerva Fester M.D.   On: 01/21/2023 23:01   CT ANGIO LOWER EXT BILAT W &/OR WO  CONTRAST  Result Date: 01/21/2023 CLINICAL DATA:  Lower extremity vascular trauma. Laceration and hematoma of the lateral aspect of the left calf. Patient on anticoagulation. EXAM: CT ANGIOGRAPHY OF ABDOMINAL AORTA WITH ILIOFEMORAL RUNOFF TECHNIQUE: Multidetector CT imaging of the abdomen, pelvis and lower extremities was performed using the standard protocol during bolus administration of intravenous contrast. Multiplanar CT image reconstructions and MIPs were obtained to evaluate the vascular anatomy. RADIATION DOSE REDUCTION: This exam was performed according to the departmental dose-optimization program which includes automated exposure control, adjustment of the mA and/or kV according to patient size and/or use of iterative reconstruction technique. CONTRAST:  OMNIPAQUE IOHEXOL 350 MG/ML SOLN COMPARISON:  None Available. FINDINGS: VASCULAR Aorta: Moderate atherosclerotic calcification of the visualized distal abdominal aorta. Celiac: Not included in the study. SMA: Not included in the images. Renals: Not included in the images. IMA: Not well opacified. RIGHT Lower Extremity Inflow: Mild atherosclerotic calcification. No aneurysmal dilatation or dissection. The iliac arteries are patent. Outflow: Common, superficial and profunda femoral arteries and the popliteal artery are patent without evidence of aneurysm, dissection, vasculitis or significant stenosis. Runoff: The anterior tibial arteries patent to the level of the ankle. The posterior tibial and fibular arteries are patent to the distal calf. Evaluation of the distal segments of these vessels is limited due to contrast washout. LEFT Lower Extremity Inflow: Mild atherosclerotic calcification. No aneurysmal dilatation or dissection. The iliac arteries are patent. Outflow: Common, superficial and profunda femoral arteries and the popliteal artery are patent without evidence of aneurysm, dissection, vasculitis or significant stenosis. Runoff: Patent  three vessel runoff to the ankle. No traumatic arterial injury or evidence of active arterial bleed. Veins: No obvious venous abnormality within the limitations of this arterial phase study. Review of the MIP images confirms the above findings. NON-VASCULAR Lower chest: Not included. Hepatobiliary: Not included Pancreas: Not included Spleen: Not included. Adrenals/Urinary Tract: The kidneys are not included. The urinary bladder is unremarkable. Stomach/Bowel: Moderate stool in the visualized colon. No abnormal bowel loops in the pelvis. Lymphatic: No pelvic adenopathy. Reproductive: The prostate and seminal vesicles are grossly unremarkable. Other: There is laceration of the skin of the lateral aspect the distal left lower  extremity. There is a 4.0 x 8.5 by 15.5 cm hematoma in the superficial soft tissues of the lateral distal left lower extremity. No contrast extravasation or evidence of active bleed. Musculoskeletal: No acute osseous pathology. Small left suprapatellar effusion. IMPRESSION: 1. No traumatic arterial injury or evidence of active arterial bleed. 2. Large subcutaneous hematoma in the superficial soft tissues of the lateral distal left lower extremity. No contrast extravasation or evidence of active bleed. 3.  Aortic Atherosclerosis (ICD10-I70.0). Electronically Signed   By: Elgie Collard M.D.   On: 01/21/2023 18:58   DG Ankle Left Port  Result Date: 01/21/2023 CLINICAL DATA:  Pain after injury EXAM: PORTABLE LEFT ANKLE - 2 VIEW; PORTABLE LEFT TIBIA AND FIBULA - 2 VIEW COMPARISON:  None Available. FINDINGS: There is no evidence of fracture, dislocation, or joint effusion. There is no evidence of arthropathy or other focal bone abnormality. Soft tissue swelling about the ankle. There also a lobular area of soft tissue thickening posterolateral to the distal tib-fib region. Please correlate with the etiology including possible hematoma versus mass lesion. IMPRESSION: No fracture or dislocation.  Soft tissue swelling about the ankle. There also a lobular area of soft tissue thickening posterolateral to the distal tib-fib region. Please correlate with the etiology including possible hematoma versus mass lesion. Additional workup as clinically appropriate or follow up to confirm resolution. Electronically Signed   By: Karen Kays M.D.   On: 01/21/2023 15:48   DG Tibia/Fibula Left Port  Result Date: 01/21/2023 CLINICAL DATA:  Pain after injury EXAM: PORTABLE LEFT ANKLE - 2 VIEW; PORTABLE LEFT TIBIA AND FIBULA - 2 VIEW COMPARISON:  None Available. FINDINGS: There is no evidence of fracture, dislocation, or joint effusion. There is no evidence of arthropathy or other focal bone abnormality. Soft tissue swelling about the ankle. There also a lobular area of soft tissue thickening posterolateral to the distal tib-fib region. Please correlate with the etiology including possible hematoma versus mass lesion. IMPRESSION: No fracture or dislocation. Soft tissue swelling about the ankle. There also a lobular area of soft tissue thickening posterolateral to the distal tib-fib region. Please correlate with the etiology including possible hematoma versus mass lesion. Additional workup as clinically appropriate or follow up to confirm resolution. Electronically Signed   By: Karen Kays M.D.   On: 01/21/2023 15:48        Scheduled Meds:  amiodarone  200 mg Oral Daily   carvedilol  3.125 mg Oral BID   digoxin  125 mcg Oral Daily   rosuvastatin  20 mg Oral Daily   Continuous Infusions:  cefTRIAXone (ROCEPHIN)  IV Stopped (01/21/23 2135)   dextrose 30 mL/hr at 01/22/23 0232     LOS: 1 day    Time spent: 45 minutes spent on chart review, discussion with nursing staff, consultants, updating family and interview/physical exam; more than 50% of that time was spent in counseling and/or coordination of care.    Joseph Art, DO [facility] Available via Epic secure chat 7am-7pm After these  hours, please refer to coverage provider listed on [email or web address] 01/22/2023, 8:04 AM

## 2023-01-22 NOTE — H&P (View-Only) (Signed)
ORTHOPAEDIC CONSULTATION  REQUESTING PHYSICIAN: Joseph Art, DO  Chief Complaint: Hematoma left leg.  HPI: Raymond Allen is a 62 y.o. male who presents with hematoma lateral left leg.  Patient states he struck his leg on a trailer.  Patient is on Xarelto.  History of colon cancer diabetes and hypertension.  Past Medical History:  Diagnosis Date   Cancer [facility])    Colon cancer metastasized to liver (HCC) 01/05/2012   Colon carcinoma (HCC)    colon ca dx 2001;   Diabetes mellitus without complication (HCC)    Hypertension    Muscle weakness-general 01/05/2012   Past Surgical History:  Procedure Laterality Date   CHOLECYSTECTOMY     COLON SURGERY     sigmoid colectomy, appendectomy   INCISIONAL HERNIA REPAIR N/A 11/27/2017   Procedure: LAPAROSCOPIC ASSISTED INCISIONAL HERNIA;  Surgeon: Berna Bue, MD;  Location: [facility];  Service: General;  Laterality: N/A;   INSERTION OF MESH N/A 11/27/2017   Procedure: INSERTION OF MESH;  Surgeon: Berna Bue, MD;  Location: [facility];  Service: General;  Laterality: N/A;   LAPAROSCOPIC LYSIS OF ADHESIONS N/A 11/27/2017   Procedure: LAPAROSCOPIC LYSIS OF ADHESIONS;  Surgeon: Berna Bue, MD;  Location: [facility];  Service: General;  Laterality: N/A;   LIVER SURGERY     partial removal [facility] in 2003   PORT-A-CATH REMOVAL Right 07/16/2012   Procedure: REMOVAL PORT-A-CATH;  Surgeon: Currie Paris, MD;  Location:  SURGERY CENTER;  Service: General;  Laterality: Right;   RIGHT/LEFT HEART CATH AND CORONARY ANGIOGRAPHY N/A 04/01/2021   Procedure: RIGHT/LEFT HEART CATH AND CORONARY ANGIOGRAPHY;  Surgeon: Dolores Patty, MD;  Location: [facility];  Service: Cardiovascular;  Laterality: N/A;   Social History   Socioeconomic History   Marital status: Single    Spouse name: Not on file   Number of children: Not on file   Years of education: Not on file   Highest education level: Not on file  Occupational  History   Not on file  Tobacco Use   Smoking status: Former    Current packs/day: 0.00    Average packs/day: 0.5 packs/day for 32.0 years (16.0 ttl pk-yrs)    Types: Cigarettes    Start date: 05/19/1983    Quit date: 05/19/2015    Years since quitting: 7.6   Smokeless tobacco: Never  Vaping Use   Vaping status: Never Used  Substance and Sexual Activity   Alcohol use: No   Drug use: No   Sexual activity: Not on file  Other Topics Concern   Not on file  Social History Narrative   Not on file   Social Determinants of Health   Financial Resource Strain: Low Risk  (06/22/2022)   Received from [facility], [facility]   Overall Financial Resource Strain (CARDIA)    Difficulty of Paying Living Expenses: Not hard at all  Food Insecurity: No Food Insecurity (06/22/2022)   Received from [facility], [facility]   Hunger Vital Sign    Worried About Running Out of Food in the Last Year: Never true    Ran Out of Food in the Last Year: Never true  Transportation Needs: No Transportation Needs (06/22/2022)   Received from [facility], [facility]   PRAPARE - Transportation    Lack of Transportation (Medical): No    Lack of Transportation (Non-Medical): No  Physical Activity: Not on file  Stress: Not on file  Social Connections:  Unknown (06/15/2022)   Received from [facility], [facility]   Social Network    Social Network: Not on file   No family history on file. - negative except otherwise stated in the family history section Allergies  Allergen Reactions   Tizanidine     Other Reaction(s): Other (See Comments)  Severe nightmares   Prior to Admission medications   Medication Sig Start Date End Date Taking? Authorizing Provider  amiodarone (PACERONE) 200 MG tablet TAKE 1 TABLET (200 MG TOTAL) BY MOUTH DAILY. TAKE 2 PILLS DAILY FOR 1 WEEK THEN ONCE A DAY Patient taking differently: Take 200 mg by mouth daily. 11/20/22  Yes Bensimhon, Bevelyn Buckles, MD  ammonium lactate  (AMLACTIN) 12 % cream Apply 1 Application topically daily. 05/30/22  Yes [provider]  carvedilol (COREG) 3.125 MG tablet TAKE 1 TABLET BY MOUTH 2 TIMES DAILY. 06/02/22  Yes Milford, Anderson Malta, FNP  cyclobenzaprine (FLEXERIL) 10 MG tablet Take 10 mg by mouth 3 (three) times daily as needed for muscle spasms.   Yes [provider]  digoxin (LANOXIN) 0.125 MG tablet TAKE 1 TABLET BY MOUTH EVERY DAY 11/20/22  Yes Bensimhon, Bevelyn Buckles, MD  empagliflozin (JARDIANCE) 10 MG TABS tablet Take 1 tablet (10 mg total) by mouth daily. 05/19/21  Yes Bensimhon, Bevelyn Buckles, MD  furosemide (LASIX) 40 MG tablet TAKE 1 TABLET (40 MG TOTAL) BY MOUTH DAILY. NEEDS FOLLOW UP APPOINTMENT FOR FURTHER REFILLS 07/28/22  Yes Bensimhon, Bevelyn Buckles, MD  KLOR-CON M20 20 MEQ tablet TAKE 1 TABLET (20 MEQ TOTAL) BY MOUTH DAILY. NEEDS FOLLOW UP APPOINTMENT FOR MORE REFILLS Patient taking differently: Take 20 mEq by mouth daily. 12/29/22  Yes Bensimhon, Bevelyn Buckles, MD  metFORMIN (GLUCOPHAGE-XR) 500 MG 24 hr tablet Take 1,000 mg by mouth 2 (two) times daily. 10/16/17  Yes [provider]  oxyCODONE-acetaminophen (PERCOCET) 7.5-325 MG tablet Take 1 tablet by mouth every 6 (six) hours as needed for severe pain (back pain). 05/26/21  Yes [provider]  rivaroxaban (XARELTO) 20 MG TABS tablet Take 1 tablet by mouth daily. 07/31/22  Yes [provider]  rosuvastatin (CRESTOR) 20 MG tablet Take 20 mg by mouth daily. 03/10/21  Yes [provider]  sacubitril-valsartan (ENTRESTO) 97-103 MG Take 1 tablet by mouth 2 (two) times daily. 05/19/21  Yes Bensimhon, Bevelyn Buckles, MD  Semaglutide, 2 MG/DOSE, (OZEMPIC, 2 MG/DOSE,) 8 MG/3ML SOPN Inject 2 mg into the skin once a week. Dose Increase   Yes [provider]  sildenafil (REVATIO) 20 MG tablet Take 20 mg by mouth daily as needed (ED).   Yes [provider]  spironolactone (ALDACTONE) 25 MG tablet TAKE 1 TABLET (25 MG TOTAL) BY MOUTH DAILY. 11/20/22   Yes Bensimhon, Bevelyn Buckles, MD  TRESIBA FLEXTOUCH 200 UNIT/ML FlexTouch Pen Inject 25 Units into the skin every evening. 03/22/21  Yes [provider]   US Abdomen Complete  Result Date: 01/21/2023 CLINICAL DATA:  Abdominal pain. History of cholecystectomy and left hepatectomy. Colon cancer with metastases to the liver. EXAM: ABDOMEN ULTRASOUND COMPLETE COMPARISON:  CT abdomen and pelvis 02/01/2022 FINDINGS: Gallbladder: Cholecystectomy. Common bile duct: Diameter: 4 mm.  No intrahepatic biliary dilation. Liver: Left hepatectomy. 5.8 x 5.3 x 4.4 cm solid heterogenous mass in the right hepatic lobe adjacent to the surgical margin of the left hepatectomy. Increased parenchymal echogenicity. Portal vein is patent on color Doppler imaging with normal direction of blood flow towards the liver. IVC: Not visualized. Pancreas: Not visualized. Spleen: Size  and appearance within normal limits.  9.1 cm. Right Kidney: Length: 10.3 cm. Echogenicity within normal limits. No mass or hydronephrosis visualized. Left Kidney: Length: 10.6 cm. Echogenicity within normal limits. No mass or hydronephrosis visualized. Abdominal aorta: No aneurysm visualized. The mid and distal aorta were not visualized. Other findings: Midline structures not visualized due to bowel gas. IMPRESSION: Indeterminate 5.8 cm mass along the resection margin of the right hepatic lobe. CT abdomen pelvis with IV contrast is recommended for further evaluation. Electronically Signed   By: Minerva Fester M.D.   On: 01/21/2023 23:01   CT ANGIO LOWER EXT BILAT W &/OR WO CONTRAST  Result Date: 01/21/2023 CLINICAL DATA:  Lower extremity vascular trauma. Laceration and hematoma of the lateral aspect of the left calf. Patient on anticoagulation. EXAM: CT ANGIOGRAPHY OF ABDOMINAL AORTA WITH ILIOFEMORAL RUNOFF TECHNIQUE: Multidetector CT imaging of the abdomen, pelvis and lower extremities was performed using the standard protocol during bolus administration of  intravenous contrast. Multiplanar CT image reconstructions and MIPs were obtained to evaluate the vascular anatomy. RADIATION DOSE REDUCTION: This exam was performed according to the departmental dose-optimization program which includes automated exposure control, adjustment of the mA and/or kV according to patient size and/or use of iterative reconstruction technique. CONTRAST:  OMNIPAQUE IOHEXOL 350 MG/ML SOLN COMPARISON:  None Available. FINDINGS: VASCULAR Aorta: Moderate atherosclerotic calcification of the visualized distal abdominal aorta. Celiac: Not included in the study. SMA: Not included in the images. Renals: Not included in the images. IMA: Not well opacified. RIGHT Lower Extremity Inflow: Mild atherosclerotic calcification. No aneurysmal dilatation or dissection. The iliac arteries are patent. Outflow: Common, superficial and profunda femoral arteries and the popliteal artery are patent without evidence of aneurysm, dissection, vasculitis or significant stenosis. Runoff: The anterior tibial arteries patent to the level of the ankle. The posterior tibial and fibular arteries are patent to the distal calf. Evaluation of the distal segments of these vessels is limited due to contrast washout. LEFT Lower Extremity Inflow: Mild atherosclerotic calcification. No aneurysmal dilatation or dissection. The iliac arteries are patent. Outflow: Common, superficial and profunda femoral arteries and the popliteal artery are patent without evidence of aneurysm, dissection, vasculitis or significant stenosis. Runoff: Patent three vessel runoff to the ankle. No traumatic arterial injury or evidence of active arterial bleed. Veins: No obvious venous abnormality within the limitations of this arterial phase study. Review of the MIP images confirms the above findings. NON-VASCULAR Lower chest: Not included. Hepatobiliary: Not included Pancreas: Not included Spleen: Not included. Adrenals/Urinary Tract: The kidneys are  not included. The urinary bladder is unremarkable. Stomach/Bowel: Moderate stool in the visualized colon. No abnormal bowel loops in the pelvis. Lymphatic: No pelvic adenopathy. Reproductive: The prostate and seminal vesicles are grossly unremarkable. Other: There is laceration of the skin of the lateral aspect the distal left lower extremity. There is a 4.0 x 8.5 by 15.5 cm hematoma in the superficial soft tissues of the lateral distal left lower extremity. No contrast extravasation or evidence of active bleed. Musculoskeletal: No acute osseous pathology. Small left suprapatellar effusion. IMPRESSION: 1. No traumatic arterial injury or evidence of active arterial bleed. 2. Large subcutaneous hematoma in the superficial soft tissues of the lateral distal left lower extremity. No contrast extravasation or evidence of active bleed. 3.  Aortic Atherosclerosis (ICD10-I70.0). Electronically Signed   By: Elgie Collard M.D.   On: 01/21/2023 18:58   DG Ankle Left Port  Result Date: 01/21/2023 CLINICAL DATA:  Pain after injury EXAM: PORTABLE LEFT ANKLE -  2 VIEW; PORTABLE LEFT TIBIA AND FIBULA - 2 VIEW COMPARISON:  None Available. FINDINGS: There is no evidence of fracture, dislocation, or joint effusion. There is no evidence of arthropathy or other focal bone abnormality. Soft tissue swelling about the ankle. There also a lobular area of soft tissue thickening posterolateral to the distal tib-fib region. Please correlate with the etiology including possible hematoma versus mass lesion. IMPRESSION: No fracture or dislocation. Soft tissue swelling about the ankle. There also a lobular area of soft tissue thickening posterolateral to the distal tib-fib region. Please correlate with the etiology including possible hematoma versus mass lesion. Additional workup as clinically appropriate or follow up to confirm resolution. Electronically Signed   By: Karen Kays M.D.   On: 01/21/2023 15:48   DG Tibia/Fibula Left  Port  Result Date: 01/21/2023 CLINICAL DATA:  Pain after injury EXAM: PORTABLE LEFT ANKLE - 2 VIEW; PORTABLE LEFT TIBIA AND FIBULA - 2 VIEW COMPARISON:  None Available. FINDINGS: There is no evidence of fracture, dislocation, or joint effusion. There is no evidence of arthropathy or other focal bone abnormality. Soft tissue swelling about the ankle. There also a lobular area of soft tissue thickening posterolateral to the distal tib-fib region. Please correlate with the etiology including possible hematoma versus mass lesion. IMPRESSION: No fracture or dislocation. Soft tissue swelling about the ankle. There also a lobular area of soft tissue thickening posterolateral to the distal tib-fib region. Please correlate with the etiology including possible hematoma versus mass lesion. Additional workup as clinically appropriate or follow up to confirm resolution. Electronically Signed   By: Karen Kays M.D.   On: 01/21/2023 15:48   - pertinent xrays, CT, MRI studies were reviewed and independently interpreted  Positive ROS: All other systems have been reviewed and were otherwise negative with the exception of those mentioned in the HPI and as above.  Physical Exam: General: Alert, no acute distress Psychiatric: Patient is competent for consent with normal mood and affect Lymphatic: No axillary or cervical lymphadenopathy Cardiovascular: No pedal edema Respiratory: No cyanosis, no use of accessory musculature GI: No organomegaly, abdomen is soft and non-tender    Images:  @ENCIMAGES @  Labs:  Lab Results  Component Value Date   HGBA1C 6.1 (H) 01/22/2023   HGBA1C 7.4 (H) 12/30/2021   HGBA1C 7.7 (H) 03/29/2021   REPTSTATUS 04/02/2021 FINAL 03/31/2021   GRAMSTAIN  03/31/2021    MODERATE WBC PRESENT,BOTH PMN AND MONONUCLEAR FEW GRAM POSITIVE COCCI IN CHAINS    CULT  03/31/2021    ABUNDANT STREPTOCOCCUS AGALACTIAE TESTING AGAINST S. AGALACTIAE NOT ROUTINELY PERFORMED DUE TO PREDICTABILITY OF  AMP/PEN/VAN SUSCEPTIBILITY. Performed at [facility], [address], [place], [place] [address]     Lab Results  Component Value Date   ALBUMIN 3.6 01/22/2023   ALBUMIN 3.6 01/22/2023   ALBUMIN 3.3 (L) 12/30/2021        Latest Ref Rng & Units 01/22/2023    1:14 AM 01/21/2023    2:34 PM 01/21/2023    2:25 PM  CBC EXTENDED  WBC 4.0 - 10.5 K/uL 7.6   7.7   RBC 4.22 - 5.81 MIL/uL 5.67   5.72   Hemoglobin 13.0 - 17.0 g/dL 64.3  32.9  51.8   HCT 39.0 - 52.0 % 47.3  48.0  47.3   Platelets 150 - 400 K/uL 247   286   NEUT# 1.7 - 7.7 K/uL   4.7   Lymph# 0.7 - 4.0 K/uL   2.1  Neurologic: Patient does not have protective sensation bilateral lower extremities.   MUSCULOSKELETAL:   Skin: Examination patient has a large hematoma lateral left calf.  Patient has a palpable dorsalis pedis pulse.  Review of the CT scan shows a large hematoma with peripheral vascular disease.  No evidence of active bleeding.  Hemoglobin 14.2 with a white cell count of 7.6.  Hemoglobin A1c 6.1.  Assessment: Assessment: Large hematoma lateral left calf secondary to blunt trauma and Xarelto.  Plan: Plan: Will plan for debridement of the hematoma on Wednesday.  Risks and benefits were discussed including potential for additional surgery.  Patient states he understands wished to proceed at this time.  Thank you for the consult and the opportunity to see Mr. Brandy Hale, MD [facility] [phone] 7:44 AM

## 2023-01-22 NOTE — Progress Notes (Signed)
[facility] night cross cover note:   I was notified by RN of the patient's request to change his existing as needed Norco.  Prn Percocet, conveying that Percocet is typically more effective for him.  He specifically requests the 7.5 mg dose of Percocet.  Per patient request, I subsequently discontinued existing order for as needed Norco and placed updated order for Percocet 7.5/325 mg p.o. every 4 hours as needed.  This is in the setting of his complaint of left lower extremity discomfort after presenting fall, as well as abdominal discomfort.    Newton Pigg, DO Hospitalist

## 2023-01-22 NOTE — ED Notes (Signed)
Positive left pedal pulse.
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
## 2023-01-22 NOTE — Progress Notes (Signed)
PT Cancellation Note  Patient Details Name: Raymond Allen MRN: [id number] DOB: Sep 07, 1960   Cancelled Treatment:    Reason Eval/Treat Not Completed: Patient not medically ready. Order is to start 10/10 after surgery on 10/9.    Angelina Ok [facility] 01/22/2023, 11:59 AM Skip Mayer PT Acute [employer] [phone]

## 2023-01-22 NOTE — Progress Notes (Signed)
Patient admitted from the ed. Patient injured his left lower extremity at home. Left leg lower swollen pulses palpable. Dressing in place with old drainage blood noted. Patient is for surgery on 04/24/22. Patient is alert and oriented x4. Mae's x4. MP shows NSR.

## 2023-01-22 NOTE — Consult Note (Signed)
ORTHOPAEDIC CONSULTATION  REQUESTING PHYSICIAN: Joseph Art, DO  Chief Complaint: Hematoma left leg.  HPI: Raymond Allen is a 62 y.o. male who presents with hematoma lateral left leg.  Patient states he struck his leg on a trailer.  Patient is on Xarelto.  History of colon cancer diabetes and hypertension.  Past Medical History:  Diagnosis Date   Cancer [facility])    Colon cancer metastasized to liver (HCC) 01/05/2012   Colon carcinoma (HCC)    colon ca dx 2001;   Diabetes mellitus without complication (HCC)    Hypertension    Muscle weakness-general 01/05/2012   Past Surgical History:  Procedure Laterality Date   CHOLECYSTECTOMY     COLON SURGERY     sigmoid colectomy, appendectomy   INCISIONAL HERNIA REPAIR N/A 11/27/2017   Procedure: LAPAROSCOPIC ASSISTED INCISIONAL HERNIA;  Surgeon: Berna Bue, MD;  Location: [facility];  Service: General;  Laterality: N/A;   INSERTION OF MESH N/A 11/27/2017   Procedure: INSERTION OF MESH;  Surgeon: Berna Bue, MD;  Location: [facility];  Service: General;  Laterality: N/A;   LAPAROSCOPIC LYSIS OF ADHESIONS N/A 11/27/2017   Procedure: LAPAROSCOPIC LYSIS OF ADHESIONS;  Surgeon: Berna Bue, MD;  Location: [facility];  Service: General;  Laterality: N/A;   LIVER SURGERY     partial removal [facility] in 2003   PORT-A-CATH REMOVAL Right 07/16/2012   Procedure: REMOVAL PORT-A-CATH;  Surgeon: Currie Paris, MD;  Location:  SURGERY CENTER;  Service: General;  Laterality: Right;   RIGHT/LEFT HEART CATH AND CORONARY ANGIOGRAPHY N/A 04/01/2021   Procedure: RIGHT/LEFT HEART CATH AND CORONARY ANGIOGRAPHY;  Surgeon: Dolores Patty, MD;  Location: [facility];  Service: Cardiovascular;  Laterality: N/A;   Social History   Socioeconomic History   Marital status: Single    Spouse name: Not on file   Number of children: Not on file   Years of education: Not on file   Highest education level: Not on file  Occupational  History   Not on file  Tobacco Use   Smoking status: Former    Current packs/day: 0.00    Average packs/day: 0.5 packs/day for 32.0 years (16.0 ttl pk-yrs)    Types: Cigarettes    Start date: 05/19/1983    Quit date: 05/19/2015    Years since quitting: 7.6   Smokeless tobacco: Never  Vaping Use   Vaping status: Never Used  Substance and Sexual Activity   Alcohol use: No   Drug use: No   Sexual activity: Not on file  Other Topics Concern   Not on file  Social History Narrative   Not on file   Social Determinants of Health   Financial Resource Strain: Low Risk  (06/22/2022)   Received from [facility], [facility]   Overall Financial Resource Strain (CARDIA)    Difficulty of Paying Living Expenses: Not hard at all  Food Insecurity: No Food Insecurity (06/22/2022)   Received from [facility], [facility]   Hunger Vital Sign    Worried About Running Out of Food in the Last Year: Never true    Ran Out of Food in the Last Year: Never true  Transportation Needs: No Transportation Needs (06/22/2022)   Received from [facility], [facility]   PRAPARE - Transportation    Lack of Transportation (Medical): No    Lack of Transportation (Non-Medical): No  Physical Activity: Not on file  Stress: Not on file  Social Connections:  Unknown (06/15/2022)   Received from [facility], [facility]   Social Network    Social Network: Not on file   No family history on file. - negative except otherwise stated in the family history section Allergies  Allergen Reactions   Tizanidine     Other Reaction(s): Other (See Comments)  Severe nightmares   Prior to Admission medications   Medication Sig Start Date End Date Taking? Authorizing Provider  amiodarone (PACERONE) 200 MG tablet TAKE 1 TABLET (200 MG TOTAL) BY MOUTH DAILY. TAKE 2 PILLS DAILY FOR 1 WEEK THEN ONCE A DAY Patient taking differently: Take 200 mg by mouth daily. 11/20/22  Yes Bensimhon, Bevelyn Buckles, MD  ammonium lactate  (AMLACTIN) 12 % cream Apply 1 Application topically daily. 05/30/22  Yes [provider]  carvedilol (COREG) 3.125 MG tablet TAKE 1 TABLET BY MOUTH 2 TIMES DAILY. 06/02/22  Yes Milford, Anderson Malta, FNP  cyclobenzaprine (FLEXERIL) 10 MG tablet Take 10 mg by mouth 3 (three) times daily as needed for muscle spasms.   Yes [provider]  digoxin (LANOXIN) 0.125 MG tablet TAKE 1 TABLET BY MOUTH EVERY DAY 11/20/22  Yes Bensimhon, Bevelyn Buckles, MD  empagliflozin (JARDIANCE) 10 MG TABS tablet Take 1 tablet (10 mg total) by mouth daily. 05/19/21  Yes Bensimhon, Bevelyn Buckles, MD  furosemide (LASIX) 40 MG tablet TAKE 1 TABLET (40 MG TOTAL) BY MOUTH DAILY. NEEDS FOLLOW UP APPOINTMENT FOR FURTHER REFILLS 07/28/22  Yes Bensimhon, Bevelyn Buckles, MD  KLOR-CON M20 20 MEQ tablet TAKE 1 TABLET (20 MEQ TOTAL) BY MOUTH DAILY. NEEDS FOLLOW UP APPOINTMENT FOR MORE REFILLS Patient taking differently: Take 20 mEq by mouth daily. 12/29/22  Yes Bensimhon, Bevelyn Buckles, MD  metFORMIN (GLUCOPHAGE-XR) 500 MG 24 hr tablet Take 1,000 mg by mouth 2 (two) times daily. 10/16/17  Yes [provider]  oxyCODONE-acetaminophen (PERCOCET) 7.5-325 MG tablet Take 1 tablet by mouth every 6 (six) hours as needed for severe pain (back pain). 05/26/21  Yes [provider]  rivaroxaban (XARELTO) 20 MG TABS tablet Take 1 tablet by mouth daily. 07/31/22  Yes [provider]  rosuvastatin (CRESTOR) 20 MG tablet Take 20 mg by mouth daily. 03/10/21  Yes [provider]  sacubitril-valsartan (ENTRESTO) 97-103 MG Take 1 tablet by mouth 2 (two) times daily. 05/19/21  Yes Bensimhon, Bevelyn Buckles, MD  Semaglutide, 2 MG/DOSE, (OZEMPIC, 2 MG/DOSE,) 8 MG/3ML SOPN Inject 2 mg into the skin once a week. Dose Increase   Yes [provider]  sildenafil (REVATIO) 20 MG tablet Take 20 mg by mouth daily as needed (ED).   Yes [provider]  spironolactone (ALDACTONE) 25 MG tablet TAKE 1 TABLET (25 MG TOTAL) BY MOUTH DAILY. 11/20/22   Yes Bensimhon, Bevelyn Buckles, MD  TRESIBA FLEXTOUCH 200 UNIT/ML FlexTouch Pen Inject 25 Units into the skin every evening. 03/22/21  Yes [provider]   US Abdomen Complete  Result Date: 01/21/2023 CLINICAL DATA:  Abdominal pain. History of cholecystectomy and left hepatectomy. Colon cancer with metastases to the liver. EXAM: ABDOMEN ULTRASOUND COMPLETE COMPARISON:  CT abdomen and pelvis 02/01/2022 FINDINGS: Gallbladder: Cholecystectomy. Common bile duct: Diameter: 4 mm.  No intrahepatic biliary dilation. Liver: Left hepatectomy. 5.8 x 5.3 x 4.4 cm solid heterogenous mass in the right hepatic lobe adjacent to the surgical margin of the left hepatectomy. Increased parenchymal echogenicity. Portal vein is patent on color Doppler imaging with normal direction of blood flow towards the liver. IVC: Not visualized. Pancreas: Not visualized. Spleen: Size  and appearance within normal limits.  9.1 cm. Right Kidney: Length: 10.3 cm. Echogenicity within normal limits. No mass or hydronephrosis visualized. Left Kidney: Length: 10.6 cm. Echogenicity within normal limits. No mass or hydronephrosis visualized. Abdominal aorta: No aneurysm visualized. The mid and distal aorta were not visualized. Other findings: Midline structures not visualized due to bowel gas. IMPRESSION: Indeterminate 5.8 cm mass along the resection margin of the right hepatic lobe. CT abdomen pelvis with IV contrast is recommended for further evaluation. Electronically Signed   By: Minerva Fester M.D.   On: 01/21/2023 23:01   CT ANGIO LOWER EXT BILAT W &/OR WO CONTRAST  Result Date: 01/21/2023 CLINICAL DATA:  Lower extremity vascular trauma. Laceration and hematoma of the lateral aspect of the left calf. Patient on anticoagulation. EXAM: CT ANGIOGRAPHY OF ABDOMINAL AORTA WITH ILIOFEMORAL RUNOFF TECHNIQUE: Multidetector CT imaging of the abdomen, pelvis and lower extremities was performed using the standard protocol during bolus administration of  intravenous contrast. Multiplanar CT image reconstructions and MIPs were obtained to evaluate the vascular anatomy. RADIATION DOSE REDUCTION: This exam was performed according to the departmental dose-optimization program which includes automated exposure control, adjustment of the mA and/or kV according to patient size and/or use of iterative reconstruction technique. CONTRAST:  OMNIPAQUE IOHEXOL 350 MG/ML SOLN COMPARISON:  None Available. FINDINGS: VASCULAR Aorta: Moderate atherosclerotic calcification of the visualized distal abdominal aorta. Celiac: Not included in the study. SMA: Not included in the images. Renals: Not included in the images. IMA: Not well opacified. RIGHT Lower Extremity Inflow: Mild atherosclerotic calcification. No aneurysmal dilatation or dissection. The iliac arteries are patent. Outflow: Common, superficial and profunda femoral arteries and the popliteal artery are patent without evidence of aneurysm, dissection, vasculitis or significant stenosis. Runoff: The anterior tibial arteries patent to the level of the ankle. The posterior tibial and fibular arteries are patent to the distal calf. Evaluation of the distal segments of these vessels is limited due to contrast washout. LEFT Lower Extremity Inflow: Mild atherosclerotic calcification. No aneurysmal dilatation or dissection. The iliac arteries are patent. Outflow: Common, superficial and profunda femoral arteries and the popliteal artery are patent without evidence of aneurysm, dissection, vasculitis or significant stenosis. Runoff: Patent three vessel runoff to the ankle. No traumatic arterial injury or evidence of active arterial bleed. Veins: No obvious venous abnormality within the limitations of this arterial phase study. Review of the MIP images confirms the above findings. NON-VASCULAR Lower chest: Not included. Hepatobiliary: Not included Pancreas: Not included Spleen: Not included. Adrenals/Urinary Tract: The kidneys are  not included. The urinary bladder is unremarkable. Stomach/Bowel: Moderate stool in the visualized colon. No abnormal bowel loops in the pelvis. Lymphatic: No pelvic adenopathy. Reproductive: The prostate and seminal vesicles are grossly unremarkable. Other: There is laceration of the skin of the lateral aspect the distal left lower extremity. There is a 4.0 x 8.5 by 15.5 cm hematoma in the superficial soft tissues of the lateral distal left lower extremity. No contrast extravasation or evidence of active bleed. Musculoskeletal: No acute osseous pathology. Small left suprapatellar effusion. IMPRESSION: 1. No traumatic arterial injury or evidence of active arterial bleed. 2. Large subcutaneous hematoma in the superficial soft tissues of the lateral distal left lower extremity. No contrast extravasation or evidence of active bleed. 3.  Aortic Atherosclerosis (ICD10-I70.0). Electronically Signed   By: Elgie Collard M.D.   On: 01/21/2023 18:58   DG Ankle Left Port  Result Date: 01/21/2023 CLINICAL DATA:  Pain after injury EXAM: PORTABLE LEFT ANKLE -  2 VIEW; PORTABLE LEFT TIBIA AND FIBULA - 2 VIEW COMPARISON:  None Available. FINDINGS: There is no evidence of fracture, dislocation, or joint effusion. There is no evidence of arthropathy or other focal bone abnormality. Soft tissue swelling about the ankle. There also a lobular area of soft tissue thickening posterolateral to the distal tib-fib region. Please correlate with the etiology including possible hematoma versus mass lesion. IMPRESSION: No fracture or dislocation. Soft tissue swelling about the ankle. There also a lobular area of soft tissue thickening posterolateral to the distal tib-fib region. Please correlate with the etiology including possible hematoma versus mass lesion. Additional workup as clinically appropriate or follow up to confirm resolution. Electronically Signed   By: Karen Kays M.D.   On: 01/21/2023 15:48   DG Tibia/Fibula Left  Port  Result Date: 01/21/2023 CLINICAL DATA:  Pain after injury EXAM: PORTABLE LEFT ANKLE - 2 VIEW; PORTABLE LEFT TIBIA AND FIBULA - 2 VIEW COMPARISON:  None Available. FINDINGS: There is no evidence of fracture, dislocation, or joint effusion. There is no evidence of arthropathy or other focal bone abnormality. Soft tissue swelling about the ankle. There also a lobular area of soft tissue thickening posterolateral to the distal tib-fib region. Please correlate with the etiology including possible hematoma versus mass lesion. IMPRESSION: No fracture or dislocation. Soft tissue swelling about the ankle. There also a lobular area of soft tissue thickening posterolateral to the distal tib-fib region. Please correlate with the etiology including possible hematoma versus mass lesion. Additional workup as clinically appropriate or follow up to confirm resolution. Electronically Signed   By: Karen Kays M.D.   On: 01/21/2023 15:48   - pertinent xrays, CT, MRI studies were reviewed and independently interpreted  Positive ROS: All other systems have been reviewed and were otherwise negative with the exception of those mentioned in the HPI and as above.  Physical Exam: General: Alert, no acute distress Psychiatric: Patient is competent for consent with normal mood and affect Lymphatic: No axillary or cervical lymphadenopathy Cardiovascular: No pedal edema Respiratory: No cyanosis, no use of accessory musculature GI: No organomegaly, abdomen is soft and non-tender    Images:  @ENCIMAGES @  Labs:  Lab Results  Component Value Date   HGBA1C 6.1 (H) 01/22/2023   HGBA1C 7.4 (H) 12/30/2021   HGBA1C 7.7 (H) 03/29/2021   REPTSTATUS 04/02/2021 FINAL 03/31/2021   GRAMSTAIN  03/31/2021    MODERATE WBC PRESENT,BOTH PMN AND MONONUCLEAR FEW GRAM POSITIVE COCCI IN CHAINS    CULT  03/31/2021    ABUNDANT STREPTOCOCCUS AGALACTIAE TESTING AGAINST S. AGALACTIAE NOT ROUTINELY PERFORMED DUE TO PREDICTABILITY OF  AMP/PEN/VAN SUSCEPTIBILITY. Performed at [facility], [address], [place], [place] [address]     Lab Results  Component Value Date   ALBUMIN 3.6 01/22/2023   ALBUMIN 3.6 01/22/2023   ALBUMIN 3.3 (L) 12/30/2021        Latest Ref Rng & Units 01/22/2023    1:14 AM 01/21/2023    2:34 PM 01/21/2023    2:25 PM  CBC EXTENDED  WBC 4.0 - 10.5 K/uL 7.6   7.7   RBC 4.22 - 5.81 MIL/uL 5.67   5.72   Hemoglobin 13.0 - 17.0 g/dL 64.3  32.9  51.8   HCT 39.0 - 52.0 % 47.3  48.0  47.3   Platelets 150 - 400 K/uL 247   286   NEUT# 1.7 - 7.7 K/uL   4.7   Lymph# 0.7 - 4.0 K/uL   2.1  Neurologic: Patient does not have protective sensation bilateral lower extremities.   MUSCULOSKELETAL:   Skin: Examination patient has a large hematoma lateral left calf.  Patient has a palpable dorsalis pedis pulse.  Review of the CT scan shows a large hematoma with peripheral vascular disease.  No evidence of active bleeding.  Hemoglobin 14.2 with a white cell count of 7.6.  Hemoglobin A1c 6.1.  Assessment: Assessment: Large hematoma lateral left calf secondary to blunt trauma and Xarelto.  Plan: Plan: Will plan for debridement of the hematoma on Wednesday.  Risks and benefits were discussed including potential for additional surgery.  Patient states he understands wished to proceed at this time.  Thank you for the consult and the opportunity to see Mr. Brandy Hale, MD [facility] [phone] 7:44 AM

## 2023-01-22 NOTE — Plan of Care (Signed)
Patient to have surgery repair wound to left lower extremity

## 2023-01-23 ENCOUNTER — Other Ambulatory Visit (HOSPITAL_COMMUNITY): Payer: Self-pay

## 2023-01-23 ENCOUNTER — Inpatient Hospital Stay (HOSPITAL_COMMUNITY): Payer: 59

## 2023-01-23 DIAGNOSIS — L03116 Cellulitis of left lower limb: Secondary | ICD-10-CM | POA: Diagnosis not present

## 2023-01-23 LAB — CBC
HCT: 41.8 % (ref 39.0–52.0)
Hemoglobin: 12.5 g/dL — ABNORMAL LOW (ref 13.0–17.0)
MCH: 24.9 pg — ABNORMAL LOW (ref 26.0–34.0)
MCHC: 29.9 g/dL — ABNORMAL LOW (ref 30.0–36.0)
MCV: 83.3 fL (ref 80.0–100.0)
Platelets: 232 10*3/uL (ref 150–400)
RBC: 5.02 MIL/uL (ref 4.22–5.81)
RDW: 14.1 % (ref 11.5–15.5)
WBC: 6.7 10*3/uL (ref 4.0–10.5)
nRBC: 0 % (ref 0.0–0.2)

## 2023-01-23 LAB — BASIC METABOLIC PANEL
Anion gap: 10 (ref 5–15)
BUN: 15 mg/dL (ref 8–23)
CO2: 28 mmol/L (ref 22–32)
Calcium: 8.8 mg/dL — ABNORMAL LOW (ref 8.9–10.3)
Chloride: 100 mmol/L (ref 98–111)
Creatinine, Ser: 1.61 mg/dL — ABNORMAL HIGH (ref 0.61–1.24)
GFR, Estimated: 47 mL/min — ABNORMAL LOW (ref >60)
Glucose, Bld: 102 mg/dL — ABNORMAL HIGH (ref 70–99)
Potassium: 4 mmol/L (ref 3.5–5.1)
Sodium: 138 mmol/L (ref 135–145)

## 2023-01-23 LAB — GLUCOSE, CAPILLARY
Glucose-Capillary: 91 mg/dL (ref 70–99)
Glucose-Capillary: 91 mg/dL (ref 70–99)
Glucose-Capillary: 111 mg/dL — ABNORMAL HIGH (ref 70–99)
Glucose-Capillary: 85 mg/dL (ref 70–99)

## 2023-01-23 LAB — HEPATIC FUNCTION PANEL
ALT: 11 U/L (ref 0–44)
AST: 13 U/L — ABNORMAL LOW (ref 15–41)
Albumin: 3.1 g/dL — ABNORMAL LOW (ref 3.5–5.0)
Alkaline Phosphatase: 38 U/L (ref 38–126)
Bilirubin, Direct: 0.2 mg/dL (ref 0.0–0.2)
Indirect Bilirubin: 0.9 mg/dL (ref 0.3–0.9)
Total Bilirubin: 1.1 mg/dL (ref 0.3–1.2)
Total Protein: 6.7 g/dL (ref 6.5–8.1)

## 2023-01-23 LAB — URIC ACID: Uric Acid, Serum: 8.2 mg/dL (ref 3.7–8.6)

## 2023-01-23 LAB — MAGNESIUM: Magnesium: 2.1 mg/dL (ref 1.7–2.4)

## 2023-01-23 LAB — BRAIN NATRIURETIC PEPTIDE: B Natriuretic Peptide: 6.6 pg/mL (ref 0.0–100.0)

## 2023-01-23 LAB — C-REACTIVE PROTEIN: CRP: 2.6 mg/dL — ABNORMAL HIGH (ref <1.0)

## 2023-01-23 LAB — OSMOLALITY: Osmolality: 299 mosm/kg — ABNORMAL HIGH (ref 275–295)

## 2023-01-23 LAB — PROCALCITONIN: Procalcitonin: <0.1 ng/mL

## 2023-01-23 LAB — PROTIME-INR
INR: 1.2 (ref 0.8–1.2)
Prothrombin Time: 15.8 s — ABNORMAL HIGH (ref 11.4–15.2)

## 2023-01-23 MED ORDER — DULOXETINE HCL 20 MG PO CPEP
20.0000 mg | ORAL_CAPSULE | Freq: Every day | ORAL | Status: DC
  Administered 2023-01-24 – 2023-01-26 (×3): 20 mg via ORAL
  Filled 2023-01-23 (×4): qty 1

## 2023-01-23 MED ORDER — RIVAROXABAN 20 MG PO TABS: 20.0000 mg | ORAL_TABLET | Freq: Every day | ORAL | Status: AC

## 2023-01-23 MED ORDER — IRBESARTAN 75 MG PO TABS
37.5000 mg | ORAL_TABLET | Freq: Every day | ORAL | Status: DC
Start: 2023-01-23 — End: 2023-01-23

## 2023-01-23 MED ORDER — POLYETHYLENE GLYCOL 3350 17 G PO PACK
17.0000 g | PACK | Freq: Two times a day (BID) | ORAL | Status: DC
  Filled 2023-01-23 (×4): qty 1

## 2023-01-23 MED ORDER — CYCLOBENZAPRINE HCL 5 MG PO TABS
10.0000 mg | ORAL_TABLET | Freq: Three times a day (TID) | ORAL | Status: DC | PRN
  Administered 2023-01-25: 10 mg via ORAL
  Filled 2023-01-23: qty 2

## 2023-01-23 MED ORDER — CHLORHEXIDINE GLUCONATE 4 % EX SOLN
60.0000 mL | Freq: Once | CUTANEOUS | Status: AC
  Administered 2023-01-24: 4 via TOPICAL
  Filled 2023-01-23: qty 60

## 2023-01-23 MED ORDER — HYDRALAZINE HCL 50 MG PO TABS
50.0000 mg | ORAL_TABLET | Freq: Three times a day (TID) | ORAL | Status: DC
  Administered 2023-01-23 – 2023-01-26 (×8): 50 mg via ORAL
  Filled 2023-01-23 (×8): qty 1

## 2023-01-23 MED ORDER — DOCUSATE SODIUM 100 MG PO CAPS
200.0000 mg | ORAL_CAPSULE | Freq: Two times a day (BID) | ORAL | Status: DC
  Administered 2023-01-24 – 2023-01-25 (×2): 200 mg via ORAL
  Filled 2023-01-23 (×3): qty 2

## 2023-01-23 MED ORDER — SPIRONOLACTONE 25 MG PO TABS: 25.0000 mg | ORAL_TABLET | Freq: Every day | ORAL | Status: DC

## 2023-01-23 MED ORDER — FUROSEMIDE 40 MG PO TABS: 40.0000 mg | ORAL_TABLET | Freq: Every day | ORAL | Status: DC

## 2023-01-23 MED ORDER — CEFAZOLIN SODIUM-DEXTROSE 2-4 GM/100ML-% IV SOLN
2.0000 g | INTRAVENOUS | Status: AC
  Administered 2023-01-24: 2 g via INTRAVENOUS
  Filled 2023-01-23: qty 100

## 2023-01-23 MED ORDER — POTASSIUM CHLORIDE CRYS ER 20 MEQ PO TBCR: 20.0000 meq | EXTENDED_RELEASE_TABLET | Freq: Every day | ORAL | Status: DC

## 2023-01-23 MED ORDER — GABAPENTIN 100 MG PO CAPS
100.0000 mg | ORAL_CAPSULE | Freq: Three times a day (TID) | ORAL | Status: DC
  Administered 2023-01-23 – 2023-01-26 (×8): 100 mg via ORAL
  Filled 2023-01-23 (×8): qty 1

## 2023-01-23 MED ORDER — SACUBITRIL-VALSARTAN 97-103 MG PO TABS: 1.0000 | ORAL_TABLET | Freq: Two times a day (BID) | ORAL | Status: DC

## 2023-01-23 MED ORDER — DOXYCYCLINE HYCLATE 100 MG PO TABS
100.0000 mg | ORAL_TABLET | Freq: Two times a day (BID) | ORAL | 0 refills | Status: DC
  Filled 2023-01-23: qty 14, 7d supply, fill #0

## 2023-01-23 MED ORDER — POVIDONE-IODINE 10 % EX SWAB
2.0000 | Freq: Once | CUTANEOUS | Status: AC
  Administered 2023-01-24: 2 via TOPICAL

## 2023-01-23 MED ORDER — LACTATED RINGERS IV SOLN: INTRAVENOUS | Status: DC

## 2023-01-23 NOTE — Progress Notes (Signed)
Patient to CT.

## 2023-01-23 NOTE — Progress Notes (Signed)
Patient in pain but refusing pain meds and new meds the doctor ordered to try to help with his pain. Patient is very argumentative and does not want to try new meds. Patient is convinced pain is not every going away nor can be controlled or treated.

## 2023-01-23 NOTE — Discharge Instructions (Addendum)
Follow with Primary MD [place], Julie M, NP in 2-3 days, follow-up with the recommended orthopedic surgeon in 1 tweek.  Request your PCP to refer you to your cardiologist and gastroenterologist within a week of discharge, review your ultrasound  findings with your PCP and gastroenterologist in detail.  Follow-up with your pain specialist within 5 to 7 days.  Get CBC, BMP, magnesium-  checked next visit with your primary MD    Activity: As tolerated with Full fall precautions use walker/cane & assistance as needed  Disposition Home    Diet: Heart Healthy, low carbohydrate diet, 1.2 L fluid restriction per day.  Check CBGs q. [facility]  Special Instructions: If you have smoked or chewed Tobacco  in the last 2 yrs please stop smoking, stop any regular Alcohol  and or any Recreational drug use.  On your next visit with your primary care physician please Get Medicines reviewed and adjusted.  Please request your Prim.MD to go over all Hospital Tests and Procedure/Radiological results at the follow up, please get all Hospital records sent to your Prim MD by signing hospital release before you go home.  If you experience worsening of your admission symptoms, develop shortness of breath, life threatening emergency, suicidal or homicidal thoughts you must seek medical attention immediately by calling 911 or calling your MD immediately  if symptoms less severe.  You Must read complete instructions/literature along with all the possible adverse reactions/side effects for all the Medicines you take and that have been prescribed to you. Take any new Medicines after you have completely understood and accpet all the possible adverse reactions/side effects.

## 2023-01-23 NOTE — Plan of Care (Signed)
Patient states pain has improved somewhat.

## 2023-01-23 NOTE — Plan of Care (Signed)

## 2023-01-23 NOTE — Discharge Summary (Addendum)
Raymond Allen NWG:[id number] DOB: [birth year] DOA: 01/21/2023  PCP: [place], Julie M, NP  Admit date: 01/21/2023  Discharge date: 01/26/2023  Admitted From: Home   Disposition:  Home - refused hospital stay   Recommendations for Outpatient Follow-up:   Follow up with PCP in 1-2 weeks  PCP Please obtain BMP/CBC, 2 view CXR in 1week,  (see Discharge instructions)   PCP Please follow up on the following pending results: Follow renal function closely, follow diuretic dose, if renal failure persist discontinue Jardiance for GERD.  Please arrange for outpatient follow-up with his gastroenterologist and cardiologist in 7 to 10 days.  Follow left lower extremity wound closely.  Must follow-up with his pain MD within a week.   Home Health: None   Equipment/Devices: None  Consultations: Orthopedics Discharge Condition: Stable    CODE STATUS: Full    Diet Recommendation: Heart Healthy Low Carb, 1.2 L fluid restriction per day   Chief Complaint  Patient presents with   Injury     Brief history of present illness from the day of admission and additional interim summary     62 y.o. male with medical history significant for paroxysmal A-fib on Eliquis, HFrEF 30-35%, history of colon cancer with metastasis to the liver status post left hepatectomy, cholecystectomy, type 2 diabetes, who presents to the ED from home after feeling poorly for the past 2 days, generalized weakness, and a fall.  He landed on his left lower extremity hitting the side of his trailer, his workup was suggestive of hematoma in his left leg, AKI and dehydration.  He was admitted.                                                                 Hospital Course    Left lower extremity hematoma/early cellulitis.  Due to mechanical injury in a patient on  Xarelto.   CT scan noted, he was seen by Dr. Lajoyce Corners, case discussed with him, he is planned for debridement of hematoma on 01/24/2023, initially he wanted to leave AMA on 01/23/2023 but subsequently agreed after multiple sessions of counseling, continue pain control, Neurontin and Cymbalta added to narcotic regimen, he again confirms that he has bilateral lower extremity pain and groin pain which has been ongoing for 5 years and is nothing new, he is supposed to see a pain doctor for that and he has already rescheduled the appointment.   Left lower extremity hematoma debridement on 01/24/2023 was done by Dr. Lajoyce Corners now has a wound VAC in place,, continue empiric antibiotic to prevent any cellulitis, okay to resume Xarelto per Dr. Lajoyce Corners, will be discharged home with Prevena pump with home PT OT.  Upon his request is home narcotics have been doubled, Neurontin and Cymbalta added.  He has upcoming appointment with his pain specialist  requested to keep that.   Note patient is chronically on narcotics being prescribed by his PCP for several years, he requested me to increase his pain regimen, gave him extra 20 pills and increased his narcotic regimen temporarily + Cymbalta + Neurontin, told him to call his PCP if he runs out of them till he sees his new pain doctor.  He is wanting even more narcotics which at this point I do not feel comfortable giving him.  He again confirms that his pain is not new and has been present for many years.   He will discharged home.  Of note patient is extremely argumentative and is almost impossible to formulate a plan with him as he continuously interrupts and wants plans to be changed on a constant basis.  He has agreed to see home health PT but not at his present house as he says that house is not safe for them to enter due to multiple pets, he will move to another house that he owns and will allow home health to come over there.      Right upper and right lower abdominal pain x  2 days, currently no abdominal pain U/S shows: Indeterminate 5.8 cm mass along the resection margin of the right hepatic lobe.  Contrast CT unremarkable except for stool burden, outpatient CT with IV contrast once renal function has stabilized per PCP.     AKI, suspect prerenal in the setting of use of diuretics Baseline creatinine appears to be 1.1 with GFR greater than 60 Entresto, diuretics and Jardiance were held here, continue to hold [place] upon discharge PCP to monitor and adjust.  Renal function has improved but not at baseline yet.   Dysuria, rule out UTI Resolved   Paroxysmal A-fib on Eliquis Stable here continue Coreg, amiodarone, digoxin and Xarelto combination.   HFrEF 30 to 35% Holding Entresto and diuretics was kept for several days renal function is improving now, placed on hydralazine and Imdur combination in place of Entresto, home diuretics resumed with caution along with Jardiance, continue Coreg and amiodarone along with digoxin.  Requested to follow-up with PCP and cardiologist within a week of discharge.  PCP to monitor blood pressure and adjust medications based on blood pressure and BMP within 5 to 7 days.   Constipation the patient.  Placed on bowel regimen.   Hyperlipidemia - Crestor   Generalized weakness with fall  does not want PT OT at home, PCP to monitor    Type 2 diabetes with hyperlipidemia/hypoglycemia Continue home regimen  Discharge diagnosis     Principal Problem:   Cellulitis Active Problems:   Hematoma of left lower leg   Laceration of left lower extremity   Wound cellulitis    Discharge instructions    Discharge Instructions     Diet - low sodium heart healthy   Complete by: As directed    Discharge instructions   Complete by: As directed    Follow with Primary MD [place], Julie M, NP in 2-3 days, follow-up with the recommended orthopedic surgeon in 1 to 2 days.  Request your PCP to refer you to your cardiologist and  gastroenterologist within a week of discharge, review your ultrasound findings with your PCP and gastroenterologist in detail.  Keep your left leg elevated at all times, follow-up with the orthopedic surgeon in the next 1 to 2 days call the office today.  Dressing change at Dr. Audrie Lia office in 1 to 2 days, you have chosen to be discharged  home and not to pursue hospital stay further.  Get CBC, BMP, magnesium-  checked next visit with your primary MD    Activity: As tolerated with Full fall precautions use walker/cane & assistance as needed  Disposition Home    Diet: Heart Healthy, low carbohydrate diet, 1.2 L fluid restriction per day.  Check CBGs q. [employer] [facility]  Special Instructions: If you have smoked or chewed Tobacco  in the last 2 yrs please stop smoking, stop any regular Alcohol  and or any Recreational drug use.  On your next visit with your primary care physician please Get Medicines reviewed and adjusted.  Please request your Prim.MD to go over all Hospital Tests and Procedure/Radiological results at the follow up, please get all Hospital records sent to your Prim MD by signing hospital release before you go home.  If you experience worsening of your admission symptoms, develop shortness of breath, life threatening emergency, suicidal or homicidal thoughts you must seek medical attention immediately by calling 911 or calling your MD immediately  if symptoms less severe.  You Must read complete instructions/literature along with all the possible adverse reactions/side effects for all the Medicines you take and that have been prescribed to you. Take any new Medicines after you have completely understood and accpet all the possible adverse reactions/side effects.   Discharge instructions   Complete by: As directed    Follow with Primary MD [place], Julie M, NP in 2-3 days, follow-up with the recommended orthopedic surgeon in 1 tweek.  Request your PCP to refer you to your cardiologist and  gastroenterologist within a week of discharge, review your ultrasound  findings with your PCP and gastroenterologist in detail.  Follow-up with your pain specialist within 5 to 7 days.  Get CBC, BMP, magnesium-  checked next visit with your primary MD    Activity: As tolerated with Full fall precautions use walker/cane & assistance as needed  Disposition Home    Diet: Heart Healthy, low carbohydrate diet, 1.2 L fluid restriction per day.  Check CBGs q. [facility]  Special Instructions: If you have smoked or chewed Tobacco  in the last 2 yrs please stop smoking, stop any regular Alcohol  and or any Recreational drug use.  On your next visit with your primary care physician please Get Medicines reviewed and adjusted.  Please request your Prim.MD to go over all Hospital Tests and Procedure/Radiological results at the follow up, please get all Hospital records sent to your Prim MD by signing hospital release before you go home.  If you experience worsening of your admission symptoms, develop shortness of breath, life threatening emergency, suicidal or homicidal thoughts you must seek medical attention immediately by calling 911 or calling your MD immediately  if symptoms less severe.  You Must read complete instructions/literature along with all the possible adverse reactions/side effects for all the Medicines you take and that have been prescribed to you. Take any new Medicines after you have completely understood and accpet all the possible adverse reactions/side effects.   Discharge wound care:   Complete by: As directed    Attach vac dressing to the prevena plus portable pump for discharge, dressing change at Dr. Audrie Lia office.   Increase activity slowly   Complete by: As directed    Increase activity slowly   Complete by: As directed    Negative Pressure Wound Therapy - Incisional   Complete by: As directed    Attach vac dressing to the prevena plus portable pump for  discharge        Discharge Medications   Allergies as of 01/26/2023       Reactions   Tizanidine    Other Reaction(s): Other (See Comments) Severe nightmares        Medication List     STOP taking these medications    sacubitril-valsartan 97-103 MG Commonly known as: ENTRESTO       TAKE these medications    amiodarone 200 MG tablet Commonly known as: PACERONE TAKE 1 TABLET (200 MG TOTAL) BY MOUTH DAILY. TAKE 2 PILLS DAILY FOR 1 WEEK THEN ONCE A DAY What changed: See the new instructions.   ammonium lactate 12 % cream Commonly known as: AMLACTIN Apply 1 Application topically daily.   carvedilol 3.125 MG tablet Commonly known as: COREG TAKE 1 TABLET BY MOUTH 2 TIMES DAILY.   cyclobenzaprine 10 MG tablet Commonly known as: FLEXERIL Take 10 mg by mouth 3 (three) times daily as needed for muscle spasms.   digoxin 0.125 MG tablet Commonly known as: LANOXIN TAKE 1 TABLET BY MOUTH EVERY DAY   docusate sodium 100 MG capsule Commonly known as: COLACE Take 2 capsules (200 mg total) by mouth 2 (two) times daily.   doxycycline 100 MG tablet Commonly known as: VIBRA-TABS Take 1 tablet (100 mg total) by mouth 2 (two) times daily.   DULoxetine 20 MG capsule Commonly known as: CYMBALTA Take 1 capsule (20 mg total) by mouth daily. Start taking on: January 27, 2023   empagliflozin 10 MG Tabs tablet Commonly known as: Jardiance Take 1 tablet (10 mg total) by mouth daily.   furosemide 40 MG tablet Commonly known as: LASIX Take 1 tablet (40 mg total) by mouth daily. Needs follow up appointment for further refills   gabapentin 100 MG capsule Commonly known as: NEURONTIN Take 1 capsule (100 mg total) by mouth 2 (two) times daily.   hydrALAZINE 50 MG tablet Commonly known as: APRESOLINE Take 1 tablet (50 mg total) by mouth every 8 (eight) hours.   isosorbide mononitrate 30 MG 24 hr tablet Commonly known as: IMDUR Take 1 tablet (30 mg total) by mouth daily.   metFORMIN 500  MG 24 hr tablet Commonly known as: GLUCOPHAGE-XR Take 1,000 mg by mouth 2 (two) times daily.   oxyCODONE-acetaminophen 7.5-325 MG tablet Commonly known as: PERCOCET Take 1-2 tablets by mouth every 6 (six) hours as needed for severe pain. What changed:  how much to take reasons to take this   Ozempic (2 MG/DOSE) 8 MG/3ML Sopn Generic drug: Semaglutide (2 MG/DOSE) Inject 2 mg into the skin once a week. Dose Increase   polyethylene glycol 17 g packet Commonly known as: MiraLax Take 17 g by mouth daily.   potassium chloride SA 20 MEQ tablet Commonly known as: Klor-Con M20 Take 1 tablet (20 mEq total) by mouth daily. What changed: See the new instructions.   rivaroxaban 20 MG Tabs tablet Commonly known as: XARELTO Take 1 tablet (20 mg total) by mouth daily. Start taking on: January 29, 2023 What changed: These instructions start on January 29, 2023. If you are unsure what to do until then, ask your doctor or other care provider.   rosuvastatin 20 MG tablet Commonly known as: CRESTOR Take 20 mg by mouth daily.   sildenafil 20 MG tablet Commonly known as: REVATIO Take 20 mg by mouth daily as needed (ED).   spironolactone 25 MG tablet Commonly known as: ALDACTONE Take 1 tablet (25 mg total) by mouth daily.   Evaristo Bury  FlexTouch 200 UNIT/ML FlexTouch Pen Generic drug: insulin degludec Inject 25 Units into the skin every evening.               Discharge Care Instructions  (From admission, onward)           Start     Ordered   01/26/23 0000  Discharge wound care:       Comments: Attach vac dressing to the prevena plus portable pump for discharge, dressing change at Dr. Audrie Lia office.   01/26/23 1610             Follow-up Information     [name], MD. Call in 1 day(s).   Specialty: Orthopedic Surgery Contact information: [address] [place] [address] [phone]         [place], Julie M, NP. Schedule an appointment as soon as  possible for a visit in 1 week(s).   Specialty: Nurse Practitioner Why: Get a appointment for gastroenterologist in 1 week for your liver findings on the CT scan.  Follow with your pain doctor within 5 to 7 days. Contact information: [address] [place] [address] [phone]                 Major procedures and Radiology Reports - PLEASE review detailed and final reports thoroughly  -       CT ABDOMEN PELVIS WO CONTRAST  Result Date: 01/23/2023 CLINICAL DATA:  Acute abdominal pain. Metastatic colon carcinoma. * Tracking Code: BO * EXAM: CT ABDOMEN AND PELVIS WITHOUT CONTRAST TECHNIQUE: Multidetector CT imaging of the abdomen and pelvis was performed following the standard protocol without IV contrast. RADIATION DOSE REDUCTION: This exam was performed according to the departmental dose-optimization program which includes automated exposure control, adjustment of the mA and/or kV according to patient size and/or use of iterative reconstruction technique. COMPARISON:  02/01/2022 FINDINGS: Lower chest: No acute findings. Hepatobiliary: Postop changes from prior left hepatectomy again noted. No mass visualized on this unenhanced exam. Prior cholecystectomy. No evidence of biliary obstruction. Pancreas: No mass or inflammatory process visualized on this unenhanced exam. Spleen: Within normal limits in size. Small peripherally calcified splenic cysts remains stable. Adrenals/Urinary tract: Several small less than 5 mm left renal calculi are again seen. No evidence of urolithiasis or hydronephrosis. Unremarkable appearance of urinary bladder. Stomach/Bowel: No evidence of obstruction, inflammatory process, or abnormal fluid collections. Large colonic stool burden noted. Vascular/Lymphatic: No pathologically enlarged lymph nodes identified. No evidence of abdominal aortic aneurysm. Reproductive:  No mass or other significant abnormality. Other:  None. Musculoskeletal: No  suspicious bone lesions identified. Right gluteal intramuscular lipoma again noted. IMPRESSION: No acute findings. Large colonic stool burden noted; recommend clinical correlation for possible constipation. No evidence of metastatic disease on this unenhanced exam. Left nephrolithiasis. No evidence of urolithiasis or hydronephrosis. Electronically Signed   By: Danae Orleans M.D.   On: 01/23/2023 13:21   DG Chest Port 1 View  Result Date: 01/23/2023 CLINICAL DATA:  Shortness of breath.  History of hypertension. EXAM: PORTABLE CHEST 1 VIEW COMPARISON:  CT 01/08/2023 FINDINGS: Stable cardiomediastinal contours. Lung volumes are low. No pleural fluid or airspace consolidation. Mild atelectasis noted in the lung bases. Visualized osseous structures are unremarkable. IMPRESSION: Low lung volumes and mild bibasilar atelectasis. Electronically Signed   By: Signa Kell M.D.   On: 01/23/2023 06:59   US Abdomen Complete  Result Date: 01/21/2023 CLINICAL DATA:  Abdominal pain. History of cholecystectomy and left hepatectomy. Colon cancer  with metastases to the liver. EXAM: ABDOMEN ULTRASOUND COMPLETE COMPARISON:  CT abdomen and pelvis 02/01/2022 FINDINGS: Gallbladder: Cholecystectomy. Common bile duct: Diameter: 4 mm.  No intrahepatic biliary dilation. Liver: Left hepatectomy. 5.8 x 5.3 x 4.4 cm solid heterogenous mass in the right hepatic lobe adjacent to the surgical margin of the left hepatectomy. Increased parenchymal echogenicity. Portal vein is patent on color Doppler imaging with normal direction of blood flow towards the liver. IVC: Not visualized. Pancreas: Not visualized. Spleen: Size and appearance within normal limits.  9.1 cm. Right Kidney: Length: 10.3 cm. Echogenicity within normal limits. No mass or hydronephrosis visualized. Left Kidney: Length: 10.6 cm. Echogenicity within normal limits. No mass or hydronephrosis visualized. Abdominal aorta: No aneurysm visualized. The mid and distal aorta were not  visualized. Other findings: Midline structures not visualized due to bowel gas. IMPRESSION: Indeterminate 5.8 cm mass along the resection margin of the right hepatic lobe. CT abdomen pelvis with IV contrast is recommended for further evaluation. Electronically Signed   By: Minerva Fester M.D.   On: 01/21/2023 23:01   CT ANGIO LOWER EXT BILAT W &/OR WO CONTRAST  Result Date: 01/21/2023 CLINICAL DATA:  Lower extremity vascular trauma. Laceration and hematoma of the lateral aspect of the left calf. Patient on anticoagulation. EXAM: CT ANGIOGRAPHY OF ABDOMINAL AORTA WITH ILIOFEMORAL RUNOFF TECHNIQUE: Multidetector CT imaging of the abdomen, pelvis and lower extremities was performed using the standard protocol during bolus administration of intravenous contrast. Multiplanar CT image reconstructions and MIPs were obtained to evaluate the vascular anatomy. RADIATION DOSE REDUCTION: This exam was performed according to the departmental dose-optimization program which includes automated exposure control, adjustment of the mA and/or kV according to patient size and/or use of iterative reconstruction technique. CONTRAST:  OMNIPAQUE IOHEXOL 350 MG/ML SOLN COMPARISON:  None Available. FINDINGS: VASCULAR Aorta: Moderate atherosclerotic calcification of the visualized distal abdominal aorta. Celiac: Not included in the study. SMA: Not included in the images. Renals: Not included in the images. IMA: Not well opacified. RIGHT Lower Extremity Inflow: Mild atherosclerotic calcification. No aneurysmal dilatation or dissection. The iliac arteries are patent. Outflow: Common, superficial and profunda femoral arteries and the popliteal artery are patent without evidence of aneurysm, dissection, vasculitis or significant stenosis. Runoff: The anterior tibial arteries patent to the level of the ankle. The posterior tibial and fibular arteries are patent to the distal calf. Evaluation of the distal segments of these vessels is  limited due to contrast washout. LEFT Lower Extremity Inflow: Mild atherosclerotic calcification. No aneurysmal dilatation or dissection. The iliac arteries are patent. Outflow: Common, superficial and profunda femoral arteries and the popliteal artery are patent without evidence of aneurysm, dissection, vasculitis or significant stenosis. Runoff: Patent three vessel runoff to the ankle. No traumatic arterial injury or evidence of active arterial bleed. Veins: No obvious venous abnormality within the limitations of this arterial phase study. Review of the MIP images confirms the above findings. NON-VASCULAR Lower chest: Not included. Hepatobiliary: Not included Pancreas: Not included Spleen: Not included. Adrenals/Urinary Tract: The kidneys are not included. The urinary bladder is unremarkable. Stomach/Bowel: Moderate stool in the visualized colon. No abnormal bowel loops in the pelvis. Lymphatic: No pelvic adenopathy. Reproductive: The prostate and seminal vesicles are grossly unremarkable. Other: There is laceration of the skin of the lateral aspect the distal left lower extremity. There is a 4.0 x 8.5 by 15.5 cm hematoma in the superficial soft tissues of the lateral distal left lower extremity. No contrast extravasation or evidence of active bleed.  Musculoskeletal: No acute osseous pathology. Small left suprapatellar effusion. IMPRESSION: 1. No traumatic arterial injury or evidence of active arterial bleed. 2. Large subcutaneous hematoma in the superficial soft tissues of the lateral distal left lower extremity. No contrast extravasation or evidence of active bleed. 3.  Aortic Atherosclerosis (ICD10-I70.0). Electronically Signed   By: Elgie Collard M.D.   On: 01/21/2023 18:58   DG Ankle Left Port  Result Date: 01/21/2023 CLINICAL DATA:  Pain after injury EXAM: PORTABLE LEFT ANKLE - 2 VIEW; PORTABLE LEFT TIBIA AND FIBULA - 2 VIEW COMPARISON:  None Available. FINDINGS: There is no evidence of fracture,  dislocation, or joint effusion. There is no evidence of arthropathy or other focal bone abnormality. Soft tissue swelling about the ankle. There also a lobular area of soft tissue thickening posterolateral to the distal tib-fib region. Please correlate with the etiology including possible hematoma versus mass lesion. IMPRESSION: No fracture or dislocation. Soft tissue swelling about the ankle. There also a lobular area of soft tissue thickening posterolateral to the distal tib-fib region. Please correlate with the etiology including possible hematoma versus mass lesion. Additional workup as clinically appropriate or follow up to confirm resolution. Electronically Signed   By: Karen Kays M.D.   On: 01/21/2023 15:48   DG Tibia/Fibula Left Port  Result Date: 01/21/2023 CLINICAL DATA:  Pain after injury EXAM: PORTABLE LEFT ANKLE - 2 VIEW; PORTABLE LEFT TIBIA AND FIBULA - 2 VIEW COMPARISON:  None Available. FINDINGS: There is no evidence of fracture, dislocation, or joint effusion. There is no evidence of arthropathy or other focal bone abnormality. Soft tissue swelling about the ankle. There also a lobular area of soft tissue thickening posterolateral to the distal tib-fib region. Please correlate with the etiology including possible hematoma versus mass lesion. IMPRESSION: No fracture or dislocation. Soft tissue swelling about the ankle. There also a lobular area of soft tissue thickening posterolateral to the distal tib-fib region. Please correlate with the etiology including possible hematoma versus mass lesion. Additional workup as clinically appropriate or follow up to confirm resolution. Electronically Signed   By: Karen Kays M.D.   On: 01/21/2023 15:48   MR LUMBAR SPINE WO CONTRAST  Result Date: 01/17/2023 CLINICAL DATA:  Low back pain, bilateral groin pain EXAM: MRI LUMBAR SPINE WITHOUT CONTRAST TECHNIQUE: Multiplanar, multisequence MR imaging of the lumbar spine was performed. No intravenous  contrast was administered. COMPARISON:  05/21/2019 FINDINGS: Segmentation:  Standard. Alignment:  Physiologic. Vertebrae: No acute fracture, evidence of discitis, or aggressive bone lesion. Conus medullaris and cauda equina: Conus extends to the T12-L1 level. Conus and cauda equina appear normal. Paraspinal and other soft tissues: No acute paraspinal abnormality. Disc levels: Disc spaces: Degenerative disease with disc height loss at L1-2, L2-3, L3-4. T12-L1: No significant disc bulge. No neural foraminal stenosis. No central canal stenosis. L1-L2: Broad-based disc bulge. No foraminal or central canal stenosis. Mild right lateral recess stenosis. L2-L3: Broad-based disc bulge flattening ventral thecal sac. Mild bilateral facet arthropathy. Mild spinal stenosis. Bilateral lateral recess stenosis. No foraminal stenosis. L3-L4: Broad-based disc bulge flattening the ventral thecal sac. Mild bilateral facet arthropathy. Bilateral lateral recess narrowing. Mild bilateral foraminal stenosis. Mild spinal stenosis. L4-L5: Broad-based disc bulge. Moderate bilateral facet arthropathy. Mild spinal stenosis. Mild bilateral foraminal stenosis. Bilateral lateral recess stenosis. L5-S1: Broad-based disc bulge with a right foraminal disc protrusion. No foraminal or central canal stenosis. Mild bilateral facet arthropathy. IMPRESSION: 1. Lumbar spine spondylosis as described above. 2. No acute osseous injury of the lumbar  spine. Electronically Signed   By: Elige Ko M.D.   On: 01/17/2023 09:23   CT ANGIO CHEST AORTA W/CM & OR WO/CM  Result Date: 01/08/2023 CLINICAL DATA:  Ascending thoracic aortic aneurysm EXAM: CT ANGIOGRAPHY CHEST WITH CONTRAST TECHNIQUE: Multidetector CT imaging of the chest was performed using the standard protocol during bolus administration of intravenous contrast. Multiplanar CT image reconstructions and MIPs were obtained to evaluate the vascular anatomy. RADIATION DOSE REDUCTION: This exam was  performed according to the departmental dose-optimization program which includes automated exposure control, adjustment of the mA and/or kV according to patient size and/or use of iterative reconstruction technique. CONTRAST:  75mL ISOVUE-370 IOPAMIDOL (ISOVUE-370) INJECTION 76% COMPARISON:  07/05/2022 and previous FINDINGS: Cardiovascular: SVC patent. Heart size normal. RV is nondilated. Satisfactory opacification of pulmonary arteries noted, and there is no evidence of pulmonary emboli. Scattered coronary calcifications. Good contrast opacification of the thoracic aorta without dissection or stenosis. 3-vessel brachiocephalic arterial origin anatomy without proximal stenosis. Aortic Root: --Valve: 2.9 cm --Sinuses: 4.6 cm --Sinotubular Junction: 3.7 cm Limitations by motion: Mild Thoracic Aorta: --Ascending Aorta: 4.5 cm open (previously 4.5) --Aortic Arch: 3.5 cm --Descending Aorta: 3.8 cm Mediastinum/Nodes: No hematoma, mass, or adenopathy. Lungs/Pleura: No pleural effusion. No pneumothorax. Minimal linear scarring in the inferior lingula. Lungs otherwise clear. Upper Abdomen: Post partial left hepatectomy. 2 cm right upper pole complex renal cyst with peripheral calcification, stable in size since 09/19/2017 implying benignity. No acute findings. Musculoskeletal: No chest wall abnormality. No acute or significant osseous findings. Review of the MIP images confirms the above findings. IMPRESSION: 1. Stable 4.5 cm ascending thoracic aortic aneurysm without complicating features. Recommend semi-annual imaging followup by CTA or MRA and referral to cardiothoracic surgery if not already obtained. This recommendation follows 2010 ACCF/AHA/AATS/ACR/ASA/SCA/SCAI/SIR/STS/SVM Guidelines for the Diagnosis and Management of Patients With Thoracic Aortic Disease. Circulation. 2010; 121: V425-Z563 2. Coronary calcifications. The presence of coronary artery disease should be assessed on a clinical basis for coronary artery  disease. Electronically Signed   By: Corlis Leak M.D.   On: 01/08/2023 16:22    Micro Results     Recent Results (from the past 240 hour(s))  MRSA Next Gen by PCR, Nasal     Status: None   Collection Time: 01/21/23  8:57 PM   Specimen: Nasal Mucosa; Nasal Swab  Result Value Ref Range Status   MRSA by PCR Next Gen NOT DETECTED NOT DETECTED Final    Comment: (NOTE) The GeneXpert MRSA Assay (FDA approved for NASAL specimens only), is one component of a comprehensive MRSA colonization surveillance program. It is not intended to diagnose MRSA infection nor to guide or monitor treatment for MRSA infections. Test performance is not FDA approved in patients less than 73 years old. Performed at [facility], [address], [place], [place] [address]   Culture, blood (Routine X 2) w Reflex to ID Panel     Status: None (Preliminary result)   Collection Time: 01/21/23  8:57 PM   Specimen: BLOOD  Result Value Ref Range Status   Specimen Description BLOOD BLOOD LEFT ARM  Final   Special Requests   Final    BOTTLES DRAWN AEROBIC AND ANAEROBIC Blood Culture adequate volume   Culture   Final    NO GROWTH 4 DAYS Performed at [facility], [address], [place], [place] [address]    Report Status PENDING  Incomplete  Culture, blood (Routine X 2) w Reflex to ID Panel  Status: None (Preliminary result)   Collection Time: 01/21/23  8:57 PM   Specimen: BLOOD  Result Value Ref Range Status   Specimen Description BLOOD BLOOD LEFT HAND  Final   Special Requests   Final    BOTTLES DRAWN AEROBIC AND ANAEROBIC Blood Culture results may not be optimal due to an inadequate volume of blood received in culture bottles   Culture   Final    NO GROWTH 4 DAYS Performed at [facility], [address] [place], [place] [address]    Report Status PENDING  Incomplete  SARS Coronavirus 2 by RT PCR (hospital order, performed in [facility] hospital lab) *cepheid single result test* Anterior  Nasal Swab     Status: None   Collection Time: 01/21/23 10:02 PM   Specimen: Anterior Nasal Swab  Result Value Ref Range Status   SARS Coronavirus 2 by RT PCR NEGATIVE NEGATIVE Final    Comment: Performed at [facility], [address], [place], [place] [address]  Aerobic/Anaerobic Culture w Gram Stain (surgical/deep wound)     Status: None (Preliminary result)   Collection Time: 01/24/23  3:14 PM   Specimen: Path Tissue  Result Value Ref Range Status   Specimen Description TISSUE LEFT LEG  Final   Special Requests PT ON ROCEPHIN AND ANCEF  Final   Gram Stain NO WBC SEEN NO ORGANISMS SEEN   Final   Culture   Final    CULTURE REINCUBATED FOR BETTER GROWTH Performed at [facility], [address] [place], [place] [address]    Report Status PENDING  Incomplete    Today   Subjective    Kendahl Denino today has no headache,no chest abdominal pain,no new weakness tingling or numbness, feels much better wants to go home today.     Objective   Blood pressure 117/68, pulse 71, temperature 97.8 F (36.6 C), temperature source Oral, resp. rate 10, height 6' (1.829 m), weight 110.5 kg, SpO2 94%.   Intake/Output Summary (Last 24 hours) at 01/26/2023 0848 Last data filed at 01/26/2023 0400 Gross per 24 hour  Intake 570.14 ml  Output --  Net 570.14 ml    Exam  Awake Alert, No new F.N deficits,    [place].AT,PERRAL Supple Neck,   Symmetrical Chest wall movement, Good air movement bilaterally, CTAB RRR,No Gallops,   +ve B.Sounds, Abd Soft, Non tender,  Left leg mildly swollen with dressing on it, visible hematoma   Data Review   Recent Labs  Lab 01/21/23 1425 01/21/23 1434 01/22/23 0114 01/22/23 1322 01/23/23 0347 01/24/23 0323 01/25/23 0626 01/26/23 0352  WBC 7.7  --  7.6  --  6.7 6.3 8.0 7.3  HGB 14.7   < > 14.2 12.4* 12.5* 12.5* 12.6* 12.2*  HCT 47.3   < > 47.3 40.3 41.8 41.8 41.8 40.4  PLT 286  --  247  --  232 234 266 282  MCV 82.7  --  83.4  --   83.3 85.0 82.9 83.1  MCH 25.7*  --  25.0*  --  24.9* 25.4* 25.0* 25.1*  MCHC 31.1  --  30.0  --  29.9* 29.9* 30.1 30.2  RDW 14.1  --  14.1  --  14.1 14.1 14.0 14.1  LYMPHSABS 2.1  --   --   --   --  1.7 1.5 1.8  MONOABS 0.6  --   --   --   --  0.8 0.4 0.8  EOSABS 0.2  --   --   --   --  0.2 0.0 0.1  BASOSABS 0.1  --   --   --   --  0.0 0.0 0.0   < > = values in this interval not displayed.    Recent Labs  Lab 01/21/23 1425 01/21/23 1434 01/22/23 0114 01/23/23 0347 01/23/23 0731 01/23/23 0742 01/24/23 0323 01/25/23 0626 01/26/23 0352  NA 137   < > 137 138  --   --  139 140 140  K 4.1   < > 4.4 4.0  --   --  4.4 4.6 4.2  CL 97*   < > 99 100  --   --  103 102 102  CO2 24  --  26 28  --   --  27 26 29   ANIONGAP 16*  --  12 10  --   --  9 12 9   GLUCOSE 110*   < > 102* 102*  --   --  87 110* 101*  BUN 16   < > 14 15  --   --  16 17 20   CREATININE 1.72*   < > 1.54* 1.61*  --   --  1.49* 1.38* 1.40*  AST  --   --  17  17 13*  --   --  12* 12* 14*  ALT  --   --  12  10 11   --   --  14 12 11   ALKPHOS  --   --  48  47 38  --   --  42 48 43  BILITOT  --   --  2.3*  2.3* 1.1  --   --  1.2 1.3* 0.8  ALBUMIN  --   --  3.6  3.6 3.1*  --   --  3.0* 3.1* 3.0*  CRP  --   --   --   --   --  2.6* 3.2* 6.6* 4.8*  PROCALCITON  --   --   --  <0.10  --   --  <0.10 0.10 <0.10  INR 1.3*  --   --   --  1.2  --   --   --   --   HGBA1C  --   --  6.1*  --   --   --   --   --   --   BNP  --   --  12.3 6.6  --   --  6.0 22.2 15.6  MG  --   --  2.0 2.1  --   --  2.0 2.2 2.2  CALCIUM 9.4  --  9.1 8.8*  --   --  9.1 9.3 9.0   < > = values in this interval not displayed.   CBG (last 3)  Recent Labs    01/25/23 2117 01/26/23 0808 01/26/23 0843  GLUCAP 110* 162* 132*    Total Time in preparing paper work, data evaluation and todays exam - 35 minutes  Signature  -    Susa Raring M.D on 01/26/2023 at 8:48 AM   -  To page go to [email or web address]

## 2023-01-24 ENCOUNTER — Inpatient Hospital Stay (HOSPITAL_COMMUNITY): Payer: 59 | Admitting: Anesthesiology

## 2023-01-24 ENCOUNTER — Encounter (HOSPITAL_COMMUNITY)
Admission: EM | Disposition: A | Discharge status: Home / Self Care | Payer: Self-pay | Source: Home / Self Care | Attending: Internal Medicine

## 2023-01-24 ENCOUNTER — Other Ambulatory Visit: Payer: Self-pay

## 2023-01-24 ENCOUNTER — Encounter (HOSPITAL_COMMUNITY): Payer: Self-pay | Admitting: Internal Medicine

## 2023-01-24 DIAGNOSIS — S8012XA Contusion of left lower leg, initial encounter: Secondary | ICD-10-CM | POA: Diagnosis not present

## 2023-01-24 DIAGNOSIS — I509 Heart failure, unspecified: Secondary | ICD-10-CM | POA: Diagnosis not present

## 2023-01-24 DIAGNOSIS — L03116 Cellulitis of left lower limb: Secondary | ICD-10-CM | POA: Diagnosis not present

## 2023-01-24 DIAGNOSIS — E1122 Type 2 diabetes mellitus with diabetic chronic kidney disease: Secondary | ICD-10-CM

## 2023-01-24 DIAGNOSIS — N189 Chronic kidney disease, unspecified: Secondary | ICD-10-CM

## 2023-01-24 DIAGNOSIS — I132 Hypertensive heart and chronic kidney disease with heart failure and with stage 5 chronic kidney disease, or end stage renal disease: Secondary | ICD-10-CM

## 2023-01-24 HISTORY — PX: I & D EXTREMITY: SHX5045

## 2023-01-24 HISTORY — PX: APPLICATION OF WOUND VAC: SHX5189

## 2023-01-24 LAB — CBC WITH DIFFERENTIAL/PLATELET
Abs Immature Granulocytes: 0.03 10*3/uL (ref 0.00–0.07)
Basophils Absolute: 0 10*3/uL (ref 0.0–0.1)
Basophils Relative: 1 %
Eosinophils Absolute: 0.2 10*3/uL (ref 0.0–0.5)
Eosinophils Relative: 3 %
HCT: 41.8 % (ref 39.0–52.0)
Hemoglobin: 12.5 g/dL — ABNORMAL LOW (ref 13.0–17.0)
Immature Granulocytes: 1 %
Lymphocytes Relative: 28 %
Lymphs Abs: 1.7 10*3/uL (ref 0.7–4.0)
MCH: 25.4 pg — ABNORMAL LOW (ref 26.0–34.0)
MCHC: 29.9 g/dL — ABNORMAL LOW (ref 30.0–36.0)
MCV: 85 fL (ref 80.0–100.0)
Monocytes Absolute: 0.8 10*3/uL (ref 0.1–1.0)
Monocytes Relative: 13 %
Neutro Abs: 3.5 10*3/uL (ref 1.7–7.7)
Neutrophils Relative %: 54 %
Platelets: 234 10*3/uL (ref 150–400)
RBC: 4.92 MIL/uL (ref 4.22–5.81)
RDW: 14.1 % (ref 11.5–15.5)
WBC: 6.3 10*3/uL (ref 4.0–10.5)
nRBC: 0 % (ref 0.0–0.2)

## 2023-01-24 LAB — GLUCOSE, CAPILLARY
Glucose-Capillary: 73 mg/dL (ref 70–99)
Glucose-Capillary: 66 mg/dL — ABNORMAL LOW (ref 70–99)
Glucose-Capillary: 108 mg/dL — ABNORMAL HIGH (ref 70–99)
Glucose-Capillary: 65 mg/dL — ABNORMAL LOW (ref 70–99)
Glucose-Capillary: 100 mg/dL — ABNORMAL HIGH (ref 70–99)
Glucose-Capillary: 83 mg/dL (ref 70–99)
Glucose-Capillary: 82 mg/dL (ref 70–99)
Glucose-Capillary: 147 mg/dL — ABNORMAL HIGH (ref 70–99)

## 2023-01-24 LAB — COMPREHENSIVE METABOLIC PANEL
ALT: 14 U/L (ref 0–44)
AST: 12 U/L — ABNORMAL LOW (ref 15–41)
Albumin: 3 g/dL — ABNORMAL LOW (ref 3.5–5.0)
Alkaline Phosphatase: 42 U/L (ref 38–126)
Anion gap: 9 (ref 5–15)
BUN: 16 mg/dL (ref 8–23)
CO2: 27 mmol/L (ref 22–32)
Calcium: 9.1 mg/dL (ref 8.9–10.3)
Chloride: 103 mmol/L (ref 98–111)
Creatinine, Ser: 1.49 mg/dL — ABNORMAL HIGH (ref 0.61–1.24)
GFR, Estimated: 52 mL/min — ABNORMAL LOW (ref >60)
Glucose, Bld: 87 mg/dL (ref 70–99)
Potassium: 4.4 mmol/L (ref 3.5–5.1)
Sodium: 139 mmol/L (ref 135–145)
Total Bilirubin: 1.2 mg/dL (ref 0.3–1.2)
Total Protein: 6.8 g/dL (ref 6.5–8.1)

## 2023-01-24 LAB — C-REACTIVE PROTEIN: CRP: 3.2 mg/dL — ABNORMAL HIGH (ref <1.0)

## 2023-01-24 LAB — MAGNESIUM: Magnesium: 2 mg/dL (ref 1.7–2.4)

## 2023-01-24 LAB — PROCALCITONIN: Procalcitonin: <0.1 ng/mL

## 2023-01-24 LAB — BRAIN NATRIURETIC PEPTIDE: B Natriuretic Peptide: 6 pg/mL (ref 0.0–100.0)

## 2023-01-24 SURGERY — IRRIGATION AND DEBRIDEMENT EXTREMITY
Anesthesia: General | Site: Leg Lower | Laterality: Left

## 2023-01-24 MED ORDER — HYDROMORPHONE HCL 1 MG/ML IJ SOLN
0.2500 mg | INTRAMUSCULAR | Status: DC | PRN
  Administered 2023-01-24 (×4): 0.5 mg via INTRAVENOUS

## 2023-01-24 MED ORDER — FENTANYL CITRATE (PF) 100 MCG/2ML IJ SOLN
INTRAMUSCULAR | Status: DC | PRN
  Administered 2023-01-24 (×2): 50 ug via INTRAVENOUS

## 2023-01-24 MED ORDER — HYDROMORPHONE HCL 1 MG/ML IJ SOLN
INTRAMUSCULAR | Status: AC
  Filled 2023-01-24: qty 1

## 2023-01-24 MED ORDER — CHLORHEXIDINE GLUCONATE 0.12 % MT SOLN: 15.0000 mL | Freq: Once | OROMUCOSAL | Status: AC

## 2023-01-24 MED ORDER — CHLORHEXIDINE GLUCONATE 0.12 % MT SOLN
OROMUCOSAL | Status: AC
  Administered 2023-01-24: 15 mL via OROMUCOSAL
  Filled 2023-01-24: qty 15

## 2023-01-24 MED ORDER — METOCLOPRAMIDE HCL 10 MG PO TABS: 5.0000 mg | ORAL_TABLET | Freq: Three times a day (TID) | ORAL | Status: DC | PRN

## 2023-01-24 MED ORDER — OXYCODONE HCL 5 MG/5ML PO SOLN: 5.0000 mg | Freq: Once | ORAL | Status: DC | PRN

## 2023-01-24 MED ORDER — VASHE WOUND IRRIGATION OPTIME
TOPICAL | Status: DC | PRN
Start: 2023-01-24 — End: 2023-01-24
  Administered 2023-01-24: 34 [oz_av]

## 2023-01-24 MED ORDER — ORAL CARE MOUTH RINSE: 15.0000 mL | Freq: Once | OROMUCOSAL | Status: AC

## 2023-01-24 MED ORDER — OXYCODONE HCL 5 MG PO TABS
10.0000 mg | ORAL_TABLET | ORAL | Status: DC | PRN
  Administered 2023-01-24 – 2023-01-26 (×4): 15 mg via ORAL
  Filled 2023-01-24 (×4): qty 3

## 2023-01-24 MED ORDER — MAGNESIUM HYDROXIDE 400 MG/5ML PO SUSP
30.0000 mL | Freq: Two times a day (BID) | ORAL | Status: AC
  Administered 2023-01-24: 30 mL via ORAL
  Filled 2023-01-24: qty 30

## 2023-01-24 MED ORDER — DEXTROSE 50 % IV SOLN
INTRAVENOUS | Status: AC
  Filled 2023-01-24: qty 50

## 2023-01-24 MED ORDER — PROPOFOL 10 MG/ML IV BOLUS
INTRAVENOUS | Status: DC | PRN
  Administered 2023-01-24: 200 mg via INTRAVENOUS

## 2023-01-24 MED ORDER — CEFAZOLIN SODIUM-DEXTROSE 2-4 GM/100ML-% IV SOLN: 2.0000 g | Freq: Four times a day (QID) | INTRAVENOUS | Status: DC

## 2023-01-24 MED ORDER — OXYCODONE HCL 5 MG PO TABS: 5.0000 mg | ORAL_TABLET | Freq: Once | ORAL | Status: DC | PRN

## 2023-01-24 MED ORDER — MAGNESIUM CITRATE PO SOLN: 1.0000 | Freq: Once | ORAL | Status: DC | PRN

## 2023-01-24 MED ORDER — LACTATED RINGERS IV SOLN: INTRAVENOUS | Status: DC

## 2023-01-24 MED ORDER — METOCLOPRAMIDE HCL 5 MG/ML IJ SOLN: 5.0000 mg | Freq: Three times a day (TID) | INTRAMUSCULAR | Status: DC | PRN

## 2023-01-24 MED ORDER — POLYETHYLENE GLYCOL 3350 17 G PO PACK: 17.0000 g | PACK | Freq: Every day | ORAL | Status: DC | PRN

## 2023-01-24 MED ORDER — ONDANSETRON HCL 4 MG PO TABS: 4.0000 mg | ORAL_TABLET | Freq: Four times a day (QID) | ORAL | Status: DC | PRN

## 2023-01-24 MED ORDER — AMISULPRIDE (ANTIEMETIC) 5 MG/2ML IV SOLN: 10.0000 mg | Freq: Once | INTRAVENOUS | Status: DC | PRN

## 2023-01-24 MED ORDER — ONDANSETRON HCL 4 MG/2ML IJ SOLN: 4.0000 mg | Freq: Four times a day (QID) | INTRAMUSCULAR | Status: DC | PRN

## 2023-01-24 MED ORDER — HYDROMORPHONE HCL 1 MG/ML IJ SOLN: 0.5000 mg | INTRAMUSCULAR | Status: DC | PRN

## 2023-01-24 MED ORDER — DEXTROSE 50 % IV SOLN
12.5000 g | INTRAVENOUS | Status: AC
  Administered 2023-01-24: 12.5 g via INTRAVENOUS

## 2023-01-24 MED ORDER — BISACODYL 10 MG RE SUPP: 10.0000 mg | Freq: Every day | RECTAL | Status: DC | PRN

## 2023-01-24 MED ORDER — METHOCARBAMOL 500 MG PO TABS: 500.0000 mg | ORAL_TABLET | Freq: Four times a day (QID) | ORAL | Status: DC | PRN

## 2023-01-24 MED ORDER — DEXAMETHASONE SODIUM PHOSPHATE 10 MG/ML IJ SOLN
INTRAMUSCULAR | Status: DC | PRN
  Administered 2023-01-24: 10 mg via INTRAVENOUS

## 2023-01-24 MED ORDER — PHENYLEPHRINE 80 MCG/ML (10ML) SYRINGE FOR IV PUSH (FOR BLOOD PRESSURE SUPPORT)
PREFILLED_SYRINGE | INTRAVENOUS | Status: DC | PRN
Start: 2023-01-24 — End: 2023-01-24
  Administered 2023-01-24 (×4): 160 ug via INTRAVENOUS

## 2023-01-24 MED ORDER — DEXTROSE 50 % IV SOLN
12.5000 g | INTRAVENOUS | Status: AC
  Administered 2023-01-24: 12.5 g via INTRAVENOUS
  Filled 2023-01-24: qty 50

## 2023-01-24 MED ORDER — DOCUSATE SODIUM 100 MG PO CAPS
100.0000 mg | ORAL_CAPSULE | Freq: Two times a day (BID) | ORAL | Status: DC
  Filled 2023-01-24: qty 1

## 2023-01-24 MED ORDER — METHOCARBAMOL 1000 MG/10ML IJ SOLN
500.0000 mg | Freq: Four times a day (QID) | INTRAVENOUS | Status: DC | PRN
  Filled 2023-01-24: qty 5

## 2023-01-24 MED ORDER — LIDOCAINE 2% (20 MG/ML) 5 ML SYRINGE
INTRAMUSCULAR | Status: DC | PRN
  Administered 2023-01-24: 100 mg via INTRAVENOUS

## 2023-01-24 MED ORDER — ONDANSETRON HCL 4 MG/2ML IJ SOLN
INTRAMUSCULAR | Status: DC | PRN
  Administered 2023-01-24: 4 mg via INTRAVENOUS

## 2023-01-24 MED ORDER — OXYCODONE HCL 5 MG PO TABS: 5.0000 mg | ORAL_TABLET | ORAL | Status: DC | PRN

## 2023-01-24 SURGICAL SUPPLY — 38 items
BAG COUNTER SPONGE SURGICOUNT (BAG) IMPLANT
BAG SPNG CNTER NS LX DISP (BAG)
BLADE SURG 21 STRL SS (BLADE) ×1 IMPLANT
BNDG CMPR 5X6 CHSV STRCH STRL (GAUZE/BANDAGES/DRESSINGS) ×2
BNDG COHESIVE 6X5 TAN ST LF (GAUZE/BANDAGES/DRESSINGS) IMPLANT
BNDG GAUZE DERMACEA FLUFF 4 (GAUZE/BANDAGES/DRESSINGS) ×2 IMPLANT
BNDG GZE DERMACEA 4 6PLY (GAUZE/BANDAGES/DRESSINGS)
CANISTER WOUNDNEG PRESSURE 500 (CANNISTER) IMPLANT
CLEANSER WND VASHE 34 (WOUND CARE) IMPLANT
COVER SURGICAL LIGHT HANDLE (MISCELLANEOUS) ×2 IMPLANT
DRAPE INCISE IOBAN 66X45 STRL (DRAPES) IMPLANT
DRAPE U-SHAPE 47X51 STRL (DRAPES) ×1 IMPLANT
DRESSING PEEL AND PLAC PRVNA20 (GAUZE/BANDAGES/DRESSINGS) IMPLANT
DRSG ADAPTIC 3X8 NADH LF (GAUZE/BANDAGES/DRESSINGS) ×1 IMPLANT
DRSG PEEL AND PLACE PREVENA 20 (GAUZE/BANDAGES/DRESSINGS) ×1
DURAPREP 26ML APPLICATOR (WOUND CARE) ×1 IMPLANT
ELECT REM PT RETURN 9FT ADLT (ELECTROSURGICAL) ×1
ELECTRODE REM PT RTRN 9FT ADLT (ELECTROSURGICAL) IMPLANT
GAUZE SPONGE 4X4 12PLY STRL (GAUZE/BANDAGES/DRESSINGS) ×1 IMPLANT
GLOVE BIOGEL PI IND STRL 9 (GLOVE) ×1 IMPLANT
GLOVE SURG ORTHO 9.0 STRL STRW (GLOVE) ×1 IMPLANT
GOWN STRL REUS W/ TWL XL LVL3 (GOWN DISPOSABLE) ×2 IMPLANT
GOWN STRL REUS W/TWL XL LVL3 (GOWN DISPOSABLE) ×2
GRAFT SKIN WND SURGICLOSE M95 (Tissue) IMPLANT
KIT BASIN OR (CUSTOM PROCEDURE TRAY) ×1 IMPLANT
KIT TURNOVER KIT B (KITS) ×1 IMPLANT
MANIFOLD NEPTUNE II (INSTRUMENTS) ×1 IMPLANT
NS IRRIG 1000ML POUR BTL (IV SOLUTION) ×1 IMPLANT
PACK ORTHO EXTREMITY (CUSTOM PROCEDURE TRAY) ×1 IMPLANT
PAD ARMBOARD 7.5X6 YLW CONV (MISCELLANEOUS) ×2 IMPLANT
PAD NEG PRESSURE SENSATRAC (MISCELLANEOUS) IMPLANT
STOCKINETTE IMPERVIOUS 9X36 MD (GAUZE/BANDAGES/DRESSINGS) IMPLANT
SUT ETHILON 2 0 PSLX (SUTURE) ×1 IMPLANT
SWAB COLLECTION DEVICE MRSA (MISCELLANEOUS) ×1 IMPLANT
SWAB CULTURE ESWAB REG 1ML (MISCELLANEOUS) IMPLANT
TOWEL GREEN STERILE (TOWEL DISPOSABLE) ×1 IMPLANT
TUBE CONNECTING 12X1/4 (SUCTIONS) ×1 IMPLANT
YANKAUER SUCT BULB TIP NO VENT (SUCTIONS) ×1 IMPLANT

## 2023-01-24 NOTE — Anesthesia Procedure Notes (Signed)
Procedure Name: LMA Insertion Date/Time: 01/24/2023 2:24 PM  Performed by: [place], Clydene Pugh, CRNAPre-anesthesia Checklist: Patient identified, Emergency Drugs available, Suction available and Patient being monitored Patient Re-evaluated:Patient Re-evaluated prior to induction Oxygen Delivery Method: Circle system utilized Preoxygenation: Pre-oxygenation with 100% oxygen Induction Type: IV induction Ventilation: Mask ventilation without difficulty LMA: LMA inserted LMA Size: 4.0 Number of attempts: 1 Airway Equipment and Method: Bite block Placement Confirmation: positive ETCO2 Tube secured with: Tape Dental Injury: Teeth and Oropharynx as per pre-operative assessment

## 2023-01-24 NOTE — Progress Notes (Signed)
Dressing done to left leg.

## 2023-01-24 NOTE — Op Note (Signed)
01/24/2023  3:17 PM  PATIENT:  Raymond Allen    PRE-OPERATIVE DIAGNOSIS:  Hematoma Left Leg  POST-OPERATIVE DIAGNOSIS:  Same  PROCEDURE:  LEFT LEG EXCISIONAL DEBRIDEMENT of left leg wound with excision skin soft tissue muscle and fascia. Application Kerecis micro graft 95 cm for wound surface area 400 cm. Hematoma and soft tissue sent for cultures. Application of large peel in place wound VAC sponge.  SURGEON:  [name], MD  PHYSICIAN ASSISTANT:None ANESTHESIA:   General  PREOPERATIVE INDICATIONS:  [name] is a  62 y.o. male with a diagnosis of Hematoma Left Leg who failed conservative measures and elected for surgical management.    The risks benefits and alternatives were discussed with the patient preoperatively including but not limited to the risks of infection, bleeding, nerve injury, cardiopulmonary complications, the need for revision surgery, among others, and the patient was willing to proceed.  OPERATIVE IMPLANTS:   Implant Name Type Inv. Item Serial No. Manufacturer Lot No. LRB No. Used Action  GRAFT SKIN WND SURGICLOSE M95 - [id number] Tissue GRAFT SKIN WND SURGICLOSE M95  [employer] [id number] Left 1 Implanted    @ENCIMAGES @  OPERATIVE FINDINGS: Patient had a large hematoma with a large area of nonviable skin.  Fascia and muscle deep to the hematoma were healthy and viable.  OPERATIVE PROCEDURE: Patient brought the operating room underwent a general anesthetic.  After adequate levels anesthesia were obtained patient's left lower extremity was prepped using DuraPrep draped into a sterile field a timeout was called.  A 21 blade knife was used to excise the necrotic skin.  There was a large hematoma that was evacuated.  This left a wound that was 20 x 20 cm.  A rondure and Cobb elevator were used to further debride the hematoma this is debrided back to healthy viable fascia.  The wound was irrigated with Vashe lavage.  After excisional debridement the  hematoma was sent for cultures.  The wound was covered with 95 cm of Kerecis micro graft to cover a wound surface area 400 cm.  This had a good suction fit it was overwrapped with Coban patient was extubated taken the PACU in stable condition.   DISCHARGE PLANNING:  Antibiotic duration: Wound continue oral antibiotics at time of discharge.  Weightbearing: Weightbearing as tolerated  Pain medication: Opioid pathway  Dressing care/ Wound VAC: Will continue wound VAC at time of discharge.  Ambulatory devices: Walker  Discharge to: Anticipate discharge to home.  Follow-up: In the office 1 week post operative.

## 2023-01-24 NOTE — Plan of Care (Signed)

## 2023-01-24 NOTE — Interval H&P Note (Signed)
History and Physical Interval Note:  01/24/2023 6:41 AM  Raymond Allen  has presented today for surgery, with the diagnosis of Hematoma Left Leg.  The various methods of treatment have been discussed with the patient and family. After consideration of risks, benefits and other options for treatment, the patient has consented to  Procedure(s): LEFT LEG DEBRIDEMENT (Left) as a surgical intervention.  The patient's history has been reviewed, patient examined, no change in status, stable for surgery.  I have reviewed the patient's chart and labs.  Questions were answered to the patient's satisfaction.     [name]

## 2023-01-24 NOTE — Anesthesia Preprocedure Evaluation (Addendum)
Anesthesia Evaluation  Patient identified by MRN, date of birth, ID band Patient awake    Reviewed: Allergy & Precautions, NPO status , Patient's Chart, lab work & pertinent test results  History of Anesthesia Complications Negative for: history of anesthetic complications  Airway Mallampati: III  TM Distance: >3 FB Neck ROM: Full    Dental  (+) Dental Advisory Given   Pulmonary neg shortness of breath, neg COPD, neg recent URI, former smoker   Pulmonary exam normal breath sounds clear to auscultation       Cardiovascular hypertension, Pt. on home beta blockers (-) angina +CHF (EF 30-35%)  (-) Past MI, (-) Cardiac Stents and (-) CABG + dysrhythmias Atrial Fibrillation  Rhythm:Regular Rate:Normal  HLD  TTE 12/30/2021: IMPRESSIONS     1. Left ventricular ejection fraction, by estimation, is 30 to 35%. The  left ventricle has moderately decreased function. The left ventricle  demonstrates regional wall motion abnormalities (see scoring  diagram/findings for description). The left  ventricular internal cavity size was mildly dilated. There is mild left  ventricular hypertrophy. Left ventricular diastolic parameters are  indeterminate.   2. Right ventricular systolic function is normal. The right ventricular  size is normal.   3. Left atrial size was mild to moderately dilated.   4. The mitral valve is normal in structure. Trivial mitral valve  regurgitation. No evidence of mitral stenosis.   5. The aortic valve was not well visualized. There is mild calcification  of the aortic valve. Aortic valve regurgitation is trivial. Aortic valve  sclerosis/calcification is present, without any evidence of aortic  stenosis.   6. Aortic dilatation noted. There is mild dilatation of the ascending  aorta, measuring 42 mm.     Neuro/Psych negative neurological ROS     GI/Hepatic Neg liver ROS,neg GERD  ,,Colon cancer   Endo/Other   diabetes, Type 2, Oral Hypoglycemic Agents    Renal/GU CRF and ARFRenal disease     Musculoskeletal   Abdominal  (+) + obese  Peds  Hematology negative hematology ROS (+) Lab Results      Component                Value               Date                      WBC                      6.3                 01/24/2023                HGB                      12.5 (L)            01/24/2023                HCT                      41.8                01/24/2023                MCV                      85.0  01/24/2023                PLT                      234                 01/24/2023              Anesthesia Other Findings 62 y.o. male with medical history significant for paroxysmal A-fib on Eliquis, HFrEF 30-35%, history of colon cancer with metastasis to the liver status post left hepatectomy, cholecystectomy, type 2 diabetes, who presents to the ED from home after feeling poorly for the past 2 days, generalized weakness, and a fall.  He landed on his left lower extremity hitting the side of his trailer, his workup was suggestive of hematoma in his left leg, AKI and dehydration.  He was admitted.  Last Ozempic: 01/15/2023  Reproductive/Obstetrics                             Anesthesia Physical Anesthesia Plan  ASA: 4  Anesthesia Plan: General   Post-op Pain Management:    Induction: Intravenous  PONV Risk Score and Plan: 2 and Ondansetron, Dexamethasone and Treatment may vary due to age or medical condition  Airway Management Planned: LMA  Additional Equipment:   Intra-op Plan:   Post-operative Plan: Extubation in OR  Informed Consent: I have reviewed the patients History and Physical, chart, labs and discussed the procedure including the risks, benefits and alternatives for the proposed anesthesia with the patient or authorized representative who has indicated his/her understanding and acceptance.     Dental advisory given  Plan  Discussed with: CRNA and Anesthesiologist  Anesthesia Plan Comments: (Risks of general anesthesia discussed including, but not limited to, sore throat, hoarse voice, chipped/damaged teeth, injury to vocal cords, nausea and vomiting, allergic reactions, lung infection, heart attack, stroke, and death. All questions answered. )        Anesthesia Quick Evaluation

## 2023-01-24 NOTE — Anesthesia Postprocedure Evaluation (Signed)
Anesthesia Post Note  Patient: Raymond Allen  Procedure(s) Performed: LEFT LEG DEBRIDEMENT (Left: Leg Lower) APPLICATION OF WOUND VAC (Left: Leg Lower)     Patient location during evaluation: PACU Anesthesia Type: General Level of consciousness: awake and alert Pain management: pain level controlled Vital Signs Assessment: post-procedure vital signs reviewed and stable Respiratory status: spontaneous breathing, nonlabored ventilation and respiratory function stable Cardiovascular status: blood pressure returned to baseline and stable Postop Assessment: no apparent nausea or vomiting Anesthetic complications: no  No notable events documented.  Last Vitals:  Vitals:   01/24/23 1545 01/24/23 1600  BP: (!) 127/94 128/80  Pulse: 77 75  Resp: 14 13  Temp:  36.9 C  SpO2: 96% 93%    Last Pain:  Vitals:   01/24/23 1545  TempSrc:   PainSc: Asleep                 Chloeann Alfred,W. EDMOND

## 2023-01-24 NOTE — Progress Notes (Signed)
[facility] Pre-op Review 1.Surgical orders: Consent orders and signed Is patient alert and oriented to sign consent Antibiotic: Ancef 2 G Rocephin 2 G 2146 01/23/23  2.  Pre-procedure checklist completed:  Completed  3.  NPO: Liquid: 01/24/23 0500   Solid 01/23/23 1900  4.  CHG Bath completed: Completed Belongings removed  yes Placed in clean gown  5.  Labs Performed: CBC    Abnormal 01/23/23 CMP/BMP   Abnormal 01/23/23 PT/INR  Abnormal 01/21/23 HA1C   Abnormal 01/22/23  6.1 Type and Screen 01/21/23 expires 109/24 2359  ABO 01/21/23 Surgical PCR:  01/21/23 negative MRSA Pregnancy:  NA EKG:    01/21/23 Chest x-ray:  01/23/23   6.  Recent H&P or progress note if inpatient:  01/24/23  7.  Language Barrier:  NA  8.  Vital Signs:  Stable Oxygen:  RA  9.  Medications: Cardiac Drips:  Pain Medications:  Percocet 7.5-325 po 0522 01/24/23 Beta Blocker:    Carvedilol 0981 01/24/23 Anticoagulants:  GLP1:    Ozempic last dose 01/15/23  Pre-op Medications Ordered:  10. IV access:  11. Diabetic:     CBG:  73    @   0730 01/24/23    Perioperative Glycemic Control Scale:    Moderate

## 2023-01-24 NOTE — Progress Notes (Signed)
Repeat BS 108, post 12.5g dextrose IV push.

## 2023-01-24 NOTE — Progress Notes (Signed)
Pt is A&O x4, CBG 65 upon arrival at short stay. Pt denies hypoglycemia symptoms. D50 1/2 amp was given. Dr Isaias Cowman notified.

## 2023-01-24 NOTE — Progress Notes (Signed)
PROGRESS NOTE                                                                                                                                                                                                             Patient Demographics:    Raymond Allen, is a 62 y.o. male, DOB - 02-04-1961, ZOX:[id number]  Outpatient Primary MD for the patient is [place], Julie M, NP    LOS - 3  Admit date - 01/21/2023    Chief Complaint  Patient presents with   Injury       Brief Narrative (HPI from H&P)   62 y.o. male with medical history significant for paroxysmal A-fib on Eliquis, HFrEF 30-35%, history of colon cancer with metastasis to the liver status post left hepatectomy, cholecystectomy, type 2 diabetes, who presents to the ED from home after feeling poorly for the past 2 days, generalized weakness, and a fall.  He landed on his left lower extremity hitting the side of his trailer, his workup was suggestive of hematoma in his left leg, AKI and dehydration.  He was admitted.     Subjective:    Raymond Allen today has, No headache, No chest pain, No abdominal pain - No Nausea, No new weakness tingling or numbness, no SOB, ongoing lower abdominal, bilateral leg pain, says has been present for 5 years and is not new.   Assessment  & Plan :   Left lower extremity hematoma/early cellulitis.  Due to mechanical injury in a patient on Xarelto.   CT scan noted, he was seen by Raymond Allen, case discussed with him, he is planned for debridement of hematoma on 01/24/2023, ischial he wanted to leave AMA on 01/23/2023 but subsequently agreed after multiple sessions of counseling, continue pain control, Neurontin and Cymbalta added to narcotic regimen, he again confirms that he has bilateral lower extremity pain and groin pain which has been ongoing for 5 years and is nothing new, he is supposed to see a pain doctor for that and he has already  rescheduled the appointment.  Left lower extremity hematoma debridement on 01/24/2023, continue empiric antibiotic to prevent any cellulitis, Xarelto on hold continue to monitor, agreeable for transfusion if needed.     Right upper and right lower abdominal pain x 2 days, currently no abdominal pain U/S shows: Indeterminate 5.8 cm mass  along the resection margin of the right hepatic lobe.  Contrast CT unremarkable except for stool burden, outpatient CT with IV contrast once renal function has stabilized per PCP.   AKI, suspect prerenal in the setting of use of diuretics Baseline creatinine appears to be 1.1 with GFR greater than 60 Holding Entresto, diuretics for 3 days, PCP to monitor and adjust thereafter.  He is currently compensated from CHF standpoint, continue gentle IV fluids, transfuse as needed.   Dysuria, rule out UTI Able UA dysuria improved.   Paroxysmal A-fib on Eliquis Hold  Xarelto due to hematoma Resume home Coreg for rate control - on amiodarone and digoxin prior to admission, resume   HFrEF 30 to 35% Holding Entresto and diuretics due to AKI, currently compensated, tried switching him to hydralazine but he has refused to take any new medicine, continue Coreg and amiodarone along with digoxin.  Requested to follow-up with PCP and cardiologist within a week of discharge.  PCP to monitor blood pressure and adjust medications based on blood pressure and BMP within 5 to 7 days.   Constipation the patient.  Placed on bowel regimen.   Hyperlipidemia - Crestor   Generalized weakness with fall  does not want PT OT at home, PCP to monitor   Type 2 diabetes with hyperlipidemia/hypoglycemia Continue  SSI.  Hold Jardiance due to AKI.  CBG (last 3)  Recent Labs    01/23/23 1628 01/23/23 2034 01/24/23 0730  GLUCAP 111* 85 73           Condition - Extremely Guarded  Family Communication  : Second other Raymond Allen on 01/23/2019 24 x 2  Code Status : Full code  Consults   : Orthopedics Raymond Allen  PUD Prophylaxis : PPI   Procedures  :            Disposition Plan  :    Status is: Inpatient   DVT Prophylaxis  :    SCDs Start: 01/21/23 2059    Lab Results  Component Value Date   PLT 234 01/24/2023    Diet :  Diet Order             Diet NPO time specified  Diet effective ____                    Inpatient Medications  Scheduled Meds:  amiodarone  200 mg Oral Daily   carvedilol  3.125 mg Oral BID   digoxin  125 mcg Oral Daily   docusate sodium  200 mg Oral BID   DULoxetine  20 mg Oral Daily   gabapentin  100 mg Oral TID   hydrALAZINE  50 mg Oral Q8H   insulin aspart  0-9 Units Subcutaneous TID WC   magnesium hydroxide  30 mL Oral BID   pneumococcal 20-valent conjugate vaccine  0.5 mL Intramuscular Tomorrow-1000   polyethylene glycol  17 g Oral BID   povidone-iodine  2 Application Topical Once   rosuvastatin  20 mg Oral Daily   Continuous Infusions:   ceFAZolin (ANCEF) IV     cefTRIAXone (ROCEPHIN)  IV Stopped (01/23/23 2217)   lactated ringers     PRN Meds:.acetaminophen, cyclobenzaprine, dextrose, HYDROmorphone (DILAUDID) injection, melatonin, naLOXone (NARCAN)  injection, oxyCODONE-acetaminophen, prochlorperazine  Antibiotics  :    Anti-infectives (From admission, onward)    Start     Dose/Rate Route Frequency Ordered Stop   01/24/23 1300  ceFAZolin (ANCEF) IVPB 2g/100 mL premix  2 g 200 mL/hr over 30 Minutes Intravenous On call to O.R. 01/23/23 1837 01/25/23 0559   01/23/23 0000  doxycycline (VIBRA-TABS) 100 MG tablet        100 mg Oral 2 times daily 01/23/23 0934     01/21/23 2015  cefTRIAXone (ROCEPHIN) 2 g in sodium chloride 0.9 % 100 mL IVPB        2 g 200 mL/hr over 30 Minutes Intravenous Every 24 hours 01/21/23 2007           Objective:   Vitals:   01/23/23 2310 01/24/23 0321 01/24/23 0400 01/24/23 0800  BP: 118/81 119/88 105/71 116/72  Pulse: 86 84  80  Resp: 13 11 15  (!) 21  Temp: 98 F  (36.7 C) 97.9 F (36.6 C)  97.7 F (36.5 C)  TempSrc: Oral Oral  Oral  SpO2: 96%   97%  Weight:      Height:        Wt Readings from Last 3 Encounters:  01/21/23 109.3 kg  01/08/23 109.3 kg  07/05/22 116.1 kg     Intake/Output Summary (Last 24 hours) at 01/24/2023 0926 Last data filed at 01/24/2023 0734 Gross per 24 hour  Intake 880 ml  Output 1300 ml  Net -420 ml     Physical Exam  Awake Alert, No new F.N deficits, Normal affect [place].AT,PERRAL Supple Neck, No JVD,   Symmetrical Chest wall movement, Good air movement bilaterally, CTAB RRR,No Gallops,Rubs or new Murmurs,  +ve B.Sounds, Abd Soft, No tenderness,   Left leg mildly swollen with bandage over a small bleeding cut, surrounding hematoma but stable overall as compared to yesterday   Data Review:    Recent Labs  Lab 01/21/23 1425 01/21/23 1434 01/22/23 0114 01/22/23 1322 01/23/23 0347 01/24/23 0323  WBC 7.7  --  7.6  --  6.7 6.3  HGB 14.7 16.3 14.2 12.4* 12.5* 12.5*  HCT 47.3 48.0 47.3 40.3 41.8 41.8  PLT 286  --  247  --  232 234  MCV 82.7  --  83.4  --  83.3 85.0  MCH 25.7*  --  25.0*  --  24.9* 25.4*  MCHC 31.1  --  30.0  --  29.9* 29.9*  RDW 14.1  --  14.1  --  14.1 14.1  LYMPHSABS 2.1  --   --   --   --  1.7  MONOABS 0.6  --   --   --   --  0.8  EOSABS 0.2  --   --   --   --  0.2  BASOSABS 0.1  --   --   --   --  0.0    Recent Labs  Lab 01/21/23 1425 01/21/23 1434 01/22/23 0114 01/23/23 0347 01/23/23 0731 01/23/23 0742 01/24/23 0323  NA 137 138 137 138  --   --  139  K 4.1 4.0 4.4 4.0  --   --  4.4  CL 97* 100 99 100  --   --  103  CO2 24  --  26 28  --   --  27  ANIONGAP 16*  --  12 10  --   --  9  GLUCOSE 110* 107* 102* 102*  --   --  87  BUN 16 19 14 15   --   --  16  CREATININE 1.72* 1.80* 1.54* 1.61*  --   --  1.49*  AST  --   --  17  17 13*  --   --  12*  ALT  --   --  12  10 11   --   --  14  ALKPHOS  --   --  48  47 38  --   --  42  BILITOT  --   --  2.3*  2.3* 1.1  --    --  1.2  ALBUMIN  --   --  3.6  3.6 3.1*  --   --  3.0*  CRP  --   --   --   --   --  2.6* 3.2*  PROCALCITON  --   --   --  <0.10  --   --  <0.10  INR 1.3*  --   --   --  1.2  --   --   HGBA1C  --   --  6.1*  --   --   --   --   BNP  --   --  12.3 6.6  --   --  6.0  MG  --   --  2.0 2.1  --   --  2.0  CALCIUM 9.4  --  9.1 8.8*  --   --  9.1      Recent Labs  Lab 01/21/23 1425 01/22/23 0114 01/23/23 0347 01/23/23 0731 01/23/23 0742 01/24/23 0323  CRP  --   --   --   --  2.6* 3.2*  PROCALCITON  --   --  <0.10  --   --  <0.10  INR 1.3*  --   --  1.2  --   --   HGBA1C  --  6.1*  --   --   --   --   BNP  --  12.3 6.6  --   --  6.0  MG  --  2.0 2.1  --   --  2.0  CALCIUM 9.4 9.1 8.8*  --   --  9.1    --------------------------------------------------------------------------------------------------------------- Lab Results  Component Value Date   TRIG 123 04/05/2021    Lab Results  Component Value Date   HGBA1C 6.1 (H) 01/22/2023   No results for input(s): "TSH", "T4TOTAL", "FREET4", "T3FREE", "THYROIDAB" in the last 72 hours. No results for input(s): "VITAMINB12", "FOLATE", "FERRITIN", "TIBC", "IRON", "RETICCTPCT" in the last 72 hours. ------------------------------------------------------------------------------------------------------------------ Cardiac Enzymes No results for input(s): "CKMB", "TROPONINI", "MYOGLOBIN" in the last 168 hours.  Invalid input(s): "CK"  Micro Results Recent Results (from the past 240 hour(s))  MRSA Next Gen by PCR, Nasal     Status: None   Collection Time: 01/21/23  8:57 PM   Specimen: Nasal Mucosa; Nasal Swab  Result Value Ref Range Status   MRSA by PCR Next Gen NOT DETECTED NOT DETECTED Final    Comment: (NOTE) The GeneXpert MRSA Assay (FDA approved for NASAL specimens only), is one component of a comprehensive MRSA colonization surveillance program. It is not intended to diagnose MRSA infection nor to guide or monitor treatment  for MRSA infections. Test performance is not FDA approved in patients less than 3 years old. Performed at [facility], [address], [place], [place] [address]   Culture, blood (Routine X 2) w Reflex to ID Panel     Status: None (Preliminary result)   Collection Time: 01/21/23  8:57 PM   Specimen: BLOOD  Result Value Ref Range Status   Specimen Description BLOOD BLOOD LEFT ARM  Final   Special Requests   Final    BOTTLES DRAWN AEROBIC AND ANAEROBIC  Blood Culture adequate volume   Culture   Final    NO GROWTH 3 DAYS Performed at [facility], [address], [place], [place] [address]    Report Status PENDING  Incomplete  Culture, blood (Routine X 2) w Reflex to ID Panel     Status: None (Preliminary result)   Collection Time: 01/21/23  8:57 PM   Specimen: BLOOD  Result Value Ref Range Status   Specimen Description BLOOD BLOOD LEFT HAND  Final   Special Requests   Final    BOTTLES DRAWN AEROBIC AND ANAEROBIC Blood Culture results may not be optimal due to an inadequate volume of blood received in culture bottles   Culture   Final    NO GROWTH 3 DAYS Performed at [facility], [address], [place], [place] [address]    Report Status PENDING  Incomplete  SARS Coronavirus 2 by RT PCR (hospital order, performed in [facility] hospital lab) *cepheid single result test* Anterior Nasal Swab     Status: None   Collection Time: 01/21/23 10:02 PM   Specimen: Anterior Nasal Swab  Result Value Ref Range Status   SARS Coronavirus 2 by RT PCR NEGATIVE NEGATIVE Final    Comment: Performed at [facility], [address], [place], [place] [address]    Radiology Reports CT ABDOMEN PELVIS WO CONTRAST  Result Date: 01/23/2023 CLINICAL DATA:  Acute abdominal pain. Metastatic colon carcinoma. * Tracking Code: BO * EXAM: CT ABDOMEN AND PELVIS WITHOUT CONTRAST TECHNIQUE: Multidetector CT imaging of the abdomen and pelvis was performed following the standard protocol  without IV contrast. RADIATION DOSE REDUCTION: This exam was performed according to the departmental dose-optimization program which includes automated exposure control, adjustment of the mA and/or kV according to patient size and/or use of iterative reconstruction technique. COMPARISON:  02/01/2022 FINDINGS: Lower chest: No acute findings. Hepatobiliary: Postop changes from prior left hepatectomy again noted. No mass visualized on this unenhanced exam. Prior cholecystectomy. No evidence of biliary obstruction. Pancreas: No mass or inflammatory process visualized on this unenhanced exam. Spleen: Within normal limits in size. Small peripherally calcified splenic cysts remains stable. Adrenals/Urinary tract: Several small less than 5 mm left renal calculi are again seen. No evidence of urolithiasis or hydronephrosis. Unremarkable appearance of urinary bladder. Stomach/Bowel: No evidence of obstruction, inflammatory process, or abnormal fluid collections. Large colonic stool burden noted. Vascular/Lymphatic: No pathologically enlarged lymph nodes identified. No evidence of abdominal aortic aneurysm. Reproductive:  No mass or other significant abnormality. Other:  None. Musculoskeletal: No suspicious bone lesions identified. Right gluteal intramuscular lipoma again noted. IMPRESSION: No acute findings. Large colonic stool burden noted; recommend clinical correlation for possible constipation. No evidence of metastatic disease on this unenhanced exam. Left nephrolithiasis. No evidence of urolithiasis or hydronephrosis. Electronically Signed   By: Danae Orleans Allen.D.   On: 01/23/2023 13:21   DG Chest Port 1 View  Result Date: 01/23/2023 CLINICAL DATA:  Shortness of breath.  History of hypertension. EXAM: PORTABLE CHEST 1 VIEW COMPARISON:  CT 01/08/2023 FINDINGS: Stable cardiomediastinal contours. Lung volumes are low. No pleural fluid or airspace consolidation. Mild atelectasis noted in the lung bases. Visualized  osseous structures are unremarkable. IMPRESSION: Low lung volumes and mild bibasilar atelectasis. Electronically Signed   By: Signa Kell Allen.D.   On: 01/23/2023 06:59   US Abdomen Complete  Result Date: 01/21/2023 CLINICAL DATA:  Abdominal pain. History of cholecystectomy and left hepatectomy. Colon cancer with metastases to the liver.  EXAM: ABDOMEN ULTRASOUND COMPLETE COMPARISON:  CT abdomen and pelvis 02/01/2022 FINDINGS: Gallbladder: Cholecystectomy. Common bile duct: Diameter: 4 mm.  No intrahepatic biliary dilation. Liver: Left hepatectomy. 5.8 x 5.3 x 4.4 cm solid heterogenous mass in the right hepatic lobe adjacent to the surgical margin of the left hepatectomy. Increased parenchymal echogenicity. Portal vein is patent on color Doppler imaging with normal direction of blood flow towards the liver. IVC: Not visualized. Pancreas: Not visualized. Spleen: Size and appearance within normal limits.  9.1 cm. Right Kidney: Length: 10.3 cm. Echogenicity within normal limits. No mass or hydronephrosis visualized. Left Kidney: Length: 10.6 cm. Echogenicity within normal limits. No mass or hydronephrosis visualized. Abdominal aorta: No aneurysm visualized. The mid and distal aorta were not visualized. Other findings: Midline structures not visualized due to bowel gas. IMPRESSION: Indeterminate 5.8 cm mass along the resection margin of the right hepatic lobe. CT abdomen pelvis with IV contrast is recommended for further evaluation. Electronically Signed   By: Minerva Fester Allen.D.   On: 01/21/2023 23:01   CT ANGIO LOWER EXT BILAT W &/OR WO CONTRAST  Result Date: 01/21/2023 CLINICAL DATA:  Lower extremity vascular trauma. Laceration and hematoma of the lateral aspect of the left calf. Patient on anticoagulation. EXAM: CT ANGIOGRAPHY OF ABDOMINAL AORTA WITH ILIOFEMORAL RUNOFF TECHNIQUE: Multidetector CT imaging of the abdomen, pelvis and lower extremities was performed using the standard protocol during bolus  administration of intravenous contrast. Multiplanar CT image reconstructions and MIPs were obtained to evaluate the vascular anatomy. RADIATION DOSE REDUCTION: This exam was performed according to the departmental dose-optimization program which includes automated exposure control, adjustment of the mA and/or kV according to patient size and/or use of iterative reconstruction technique. CONTRAST:  OMNIPAQUE IOHEXOL 350 MG/ML SOLN COMPARISON:  None Available. FINDINGS: VASCULAR Aorta: Moderate atherosclerotic calcification of the visualized distal abdominal aorta. Celiac: Not included in the study. SMA: Not included in the images. Renals: Not included in the images. IMA: Not well opacified. RIGHT Lower Extremity Inflow: Mild atherosclerotic calcification. No aneurysmal dilatation or dissection. The iliac arteries are patent. Outflow: Common, superficial and profunda femoral arteries and the popliteal artery are patent without evidence of aneurysm, dissection, vasculitis or significant stenosis. Runoff: The anterior tibial arteries patent to the level of the ankle. The posterior tibial and fibular arteries are patent to the distal calf. Evaluation of the distal segments of these vessels is limited due to contrast washout. LEFT Lower Extremity Inflow: Mild atherosclerotic calcification. No aneurysmal dilatation or dissection. The iliac arteries are patent. Outflow: Common, superficial and profunda femoral arteries and the popliteal artery are patent without evidence of aneurysm, dissection, vasculitis or significant stenosis. Runoff: Patent three vessel runoff to the ankle. No traumatic arterial injury or evidence of active arterial bleed. Veins: No obvious venous abnormality within the limitations of this arterial phase study. Review of the MIP images confirms the above findings. NON-VASCULAR Lower chest: Not included. Hepatobiliary: Not included Pancreas: Not included Spleen: Not included. Adrenals/Urinary  Tract: The kidneys are not included. The urinary bladder is unremarkable. Stomach/Bowel: Moderate stool in the visualized colon. No abnormal bowel loops in the pelvis. Lymphatic: No pelvic adenopathy. Reproductive: The prostate and seminal vesicles are grossly unremarkable. Other: There is laceration of the skin of the lateral aspect the distal left lower extremity. There is a 4.0 x 8.5 by 15.5 cm hematoma in the superficial soft tissues of the lateral distal left lower extremity. No contrast extravasation or evidence of active bleed. Musculoskeletal: No acute osseous pathology.  Small left suprapatellar effusion. IMPRESSION: 1. No traumatic arterial injury or evidence of active arterial bleed. 2. Large subcutaneous hematoma in the superficial soft tissues of the lateral distal left lower extremity. No contrast extravasation or evidence of active bleed. 3.  Aortic Atherosclerosis (ICD10-I70.0). Electronically Signed   By: Elgie Collard Allen.D.   On: 01/21/2023 18:58   DG Ankle Left Port  Result Date: 01/21/2023 CLINICAL DATA:  Pain after injury EXAM: PORTABLE LEFT ANKLE - 2 VIEW; PORTABLE LEFT TIBIA AND FIBULA - 2 VIEW COMPARISON:  None Available. FINDINGS: There is no evidence of fracture, dislocation, or joint effusion. There is no evidence of arthropathy or other focal bone abnormality. Soft tissue swelling about the ankle. There also a lobular area of soft tissue thickening posterolateral to the distal tib-fib region. Please correlate with the etiology including possible hematoma versus mass lesion. IMPRESSION: No fracture or dislocation. Soft tissue swelling about the ankle. There also a lobular area of soft tissue thickening posterolateral to the distal tib-fib region. Please correlate with the etiology including possible hematoma versus mass lesion. Additional workup as clinically appropriate or follow up to confirm resolution. Electronically Signed   By: Karen Kays Allen.D.   On: 01/21/2023 15:48   DG  Tibia/Fibula Left Port  Result Date: 01/21/2023 CLINICAL DATA:  Pain after injury EXAM: PORTABLE LEFT ANKLE - 2 VIEW; PORTABLE LEFT TIBIA AND FIBULA - 2 VIEW COMPARISON:  None Available. FINDINGS: There is no evidence of fracture, dislocation, or joint effusion. There is no evidence of arthropathy or other focal bone abnormality. Soft tissue swelling about the ankle. There also a lobular area of soft tissue thickening posterolateral to the distal tib-fib region. Please correlate with the etiology including possible hematoma versus mass lesion. IMPRESSION: No fracture or dislocation. Soft tissue swelling about the ankle. There also a lobular area of soft tissue thickening posterolateral to the distal tib-fib region. Please correlate with the etiology including possible hematoma versus mass lesion. Additional workup as clinically appropriate or follow up to confirm resolution. Electronically Signed   By: Karen Kays Allen.D.   On: 01/21/2023 15:48      Signature  -   Susa Raring Allen.D on 01/24/2023 at 9:26 AM   -  To page go to [email or web address]

## 2023-01-24 NOTE — Transfer of Care (Signed)
Immediate Anesthesia Transfer of Care Note  Patient: Raymond Allen  Procedure(s) Performed: LEFT LEG DEBRIDEMENT (Left)  Patient Location: PACU  Anesthesia Type:General  Level of Consciousness: awake, alert , and oriented  Airway & Oxygen Therapy: Patient Spontanous Breathing and Patient connected to face mask oxygen  Post-op Assessment: Report given to RN and Post -op Vital signs reviewed and stable  Post vital signs: Reviewed and stable  Last Vitals:  Vitals Value Taken Time  BP 144/91 01/24/23 1521  Temp 97.3   Pulse 78 01/24/23 1522  Resp 12 01/24/23 1522  SpO2 100 % 01/24/23 1522  Vitals shown include unfiled device data.  Last Pain:  Vitals:   01/24/23 1311  TempSrc:   PainSc: 4          Complications: No notable events documented.

## 2023-01-24 NOTE — Care Management Important Message (Signed)
Important Message  Patient Details  Name: Raymond Allen MRN: [id number] Date of Birth: June 02, 1960   Important Message Given:  Yes - Medicare IM     Dorena Bodo 01/24/2023, 2:52 PM

## 2023-01-25 ENCOUNTER — Encounter (HOSPITAL_COMMUNITY): Payer: Self-pay | Admitting: Orthopedic Surgery

## 2023-01-25 ENCOUNTER — Other Ambulatory Visit (HOSPITAL_COMMUNITY): Payer: Self-pay

## 2023-01-25 DIAGNOSIS — L03116 Cellulitis of left lower limb: Secondary | ICD-10-CM | POA: Diagnosis not present

## 2023-01-25 LAB — CBC WITH DIFFERENTIAL/PLATELET
Abs Immature Granulocytes: 0.04 10*3/uL (ref 0.00–0.07)
Basophils Absolute: 0 10*3/uL (ref 0.0–0.1)
Basophils Relative: 0 %
Eosinophils Absolute: 0 10*3/uL (ref 0.0–0.5)
Eosinophils Relative: 0 %
HCT: 41.8 % (ref 39.0–52.0)
Hemoglobin: 12.6 g/dL — ABNORMAL LOW (ref 13.0–17.0)
Immature Granulocytes: 1 %
Lymphocytes Relative: 19 %
Lymphs Abs: 1.5 10*3/uL (ref 0.7–4.0)
MCH: 25 pg — ABNORMAL LOW (ref 26.0–34.0)
MCHC: 30.1 g/dL (ref 30.0–36.0)
MCV: 82.9 fL (ref 80.0–100.0)
Monocytes Absolute: 0.4 10*3/uL (ref 0.1–1.0)
Monocytes Relative: 5 %
Neutro Abs: 6.1 10*3/uL (ref 1.7–7.7)
Neutrophils Relative %: 75 %
Platelets: 266 10*3/uL (ref 150–400)
RBC: 5.04 MIL/uL (ref 4.22–5.81)
RDW: 14 % (ref 11.5–15.5)
WBC: 8 10*3/uL (ref 4.0–10.5)
nRBC: 0 % (ref 0.0–0.2)

## 2023-01-25 LAB — GLUCOSE, CAPILLARY
Glucose-Capillary: 86 mg/dL (ref 70–99)
Glucose-Capillary: 96 mg/dL (ref 70–99)
Glucose-Capillary: 107 mg/dL — ABNORMAL HIGH (ref 70–99)
Glucose-Capillary: 110 mg/dL — ABNORMAL HIGH (ref 70–99)

## 2023-01-25 LAB — MAGNESIUM: Magnesium: 2.2 mg/dL (ref 1.7–2.4)

## 2023-01-25 LAB — COMPREHENSIVE METABOLIC PANEL
ALT: 12 U/L (ref 0–44)
AST: 12 U/L — ABNORMAL LOW (ref 15–41)
Albumin: 3.1 g/dL — ABNORMAL LOW (ref 3.5–5.0)
Alkaline Phosphatase: 48 U/L (ref 38–126)
Anion gap: 12 (ref 5–15)
BUN: 17 mg/dL (ref 8–23)
CO2: 26 mmol/L (ref 22–32)
Calcium: 9.3 mg/dL (ref 8.9–10.3)
Chloride: 102 mmol/L (ref 98–111)
Creatinine, Ser: 1.38 mg/dL — ABNORMAL HIGH (ref 0.61–1.24)
GFR, Estimated: 57 mL/min — ABNORMAL LOW (ref >60)
Glucose, Bld: 110 mg/dL — ABNORMAL HIGH (ref 70–99)
Potassium: 4.6 mmol/L (ref 3.5–5.1)
Sodium: 140 mmol/L (ref 135–145)
Total Bilirubin: 1.3 mg/dL — ABNORMAL HIGH (ref 0.3–1.2)
Total Protein: 7.2 g/dL (ref 6.5–8.1)

## 2023-01-25 LAB — PROCALCITONIN: Procalcitonin: 0.1 ng/mL

## 2023-01-25 LAB — C-REACTIVE PROTEIN: CRP: 6.6 mg/dL — ABNORMAL HIGH (ref <1.0)

## 2023-01-25 LAB — BRAIN NATRIURETIC PEPTIDE: B Natriuretic Peptide: 22.2 pg/mL (ref 0.0–100.0)

## 2023-01-25 MED ORDER — HYDROMORPHONE HCL 1 MG/ML IJ SOLN
1.0000 mg | INTRAMUSCULAR | Status: DC | PRN
  Administered 2023-01-25 (×2): 1 mg via INTRAVENOUS
  Filled 2023-01-25 (×3): qty 1

## 2023-01-25 MED ORDER — BISACODYL 10 MG RE SUPP: 10.0000 mg | Freq: Once | RECTAL | Status: DC

## 2023-01-25 NOTE — Progress Notes (Signed)
Patient ID: Raymond Allen, male   DOB: November 24, 1960, 62 y.o.   MRN: [id number] Patient is postoperative day 1 debridement large hematoma left leg.  Patient has a peel in place wound VAC in place.  There is 200 cc in the wound VAC canister.  Patient may discharge with the Prevena plus portable wound VAC pump when he is safe to discharge medically.  This dressing will be left in place for 1 week.  No further surgical interventions at this time.

## 2023-01-25 NOTE — Progress Notes (Addendum)
PROGRESS NOTE                                                                                                                                                                                                             Patient Demographics:    Raymond Allen, is a 62 y.o. male, DOB - 01-14-1961, MWU:[id number]  Outpatient Primary MD for the patient is [place], Julie M, NP    LOS - 4  Admit date - 01/21/2023    Chief Complaint  Patient presents with   Injury       Brief Narrative (HPI from H&P)   62 y.o. male with medical history significant for paroxysmal A-fib on Eliquis, HFrEF 30-35%, history of colon cancer with metastasis to the liver status post left hepatectomy, cholecystectomy, type 2 diabetes, who presents to the ED from home after feeling poorly for the past 2 days, generalized weakness, and a fall.  He landed on his left lower extremity hitting the side of his trailer, his workup was suggestive of hematoma in his left leg, AKI and dehydration.  He was admitted.     Subjective:   Patient in bed, appears comfortable, denies any headache, no fever, no chest pain or pressure, no shortness of breath , no abdominal pain. No focal weakness.  He has chronic bilateral lower extremity pain for at least 5 years, no change in that pattern.  No new weakness or new pain.   Assessment  & Plan :   Left lower extremity hematoma/early cellulitis.  Due to mechanical injury in a patient on Xarelto.   CT scan noted, he was seen by Dr. Lajoyce Corners, case discussed with him, he is planned for debridement of hematoma on 01/24/2023, initially he wanted to leave AMA on 01/23/2023 but subsequently agreed after multiple sessions of counseling, continue pain control, Neurontin and Cymbalta added to narcotic regimen, he again confirms that he has bilateral lower extremity pain and groin pain which has been ongoing for 5 years and is nothing new, he is  supposed to see a pain doctor for that and he has already rescheduled the appointment.  Left lower extremity hematoma debridement on 01/24/2023 was done by Dr. Lajoyce Corners now has a wound VAC in place,, continue empiric antibiotic to prevent any cellulitis, Xarelto on hold continue to monitor, agreeable for transfusion if needed.  If  stable will resume Xarelto on 01/26/2023.  Will arrange for home health, patient says he has issues with home health coming to his present house due to multiple pets including several dogs, he will moved to another house that he owns with his girlfriend to allow for home health to come in, this will be arranged today.  TOC informed.      Right upper and right lower abdominal pain x 2 days, currently no abdominal pain U/S shows: Indeterminate 5.8 cm mass along the resection margin of the right hepatic lobe.  Contrast CT unremarkable except for stool burden, outpatient CT with IV contrast once renal function has stabilized per PCP.   AKI, suspect prerenal in the setting of use of diuretics Baseline creatinine appears to be 1.1 with GFR greater than 60 Holding Entresto, diuretics for 3 days, PCP to monitor and adjust thereafter.  He is currently compensated from CHF standpoint, continue gentle IV fluids, transfuse as needed.   Dysuria, rule out UTI Able UA dysuria improved.   Paroxysmal A-fib on Eliquis Hold  Xarelto due to hematoma Resume home Coreg for rate control - on amiodarone and digoxin prior to admission, resume   HFrEF 30 to 35% Holding Entresto and diuretics due to AKI, currently compensated, tried switching him to hydralazine but he has refused to take any new medicine, continue Coreg and amiodarone along with digoxin.  Requested to follow-up with PCP and cardiologist within a week of discharge.  PCP to monitor blood pressure and adjust medications based on blood pressure and BMP within 5 to 7 days.   Constipation the patient.  Placed on bowel regimen.    Hyperlipidemia - Crestor   Generalized weakness with fall  does not want PT OT at home, PCP to monitor   Type 2 diabetes with hyperlipidemia/hypoglycemia Continue  SSI.  Hold Jardiance due to AKI.  CBG (last 3)  Recent Labs    01/24/23 1620 01/24/23 2102 01/25/23 0813  GLUCAP 82 147* 86           Condition - Extremely Guarded  Family Communication  : Second other [name] on 01/23/2019 24 x 2  Code Status : Full code  Consults  : Orthopedics Dr. Lajoyce Corners  PUD Prophylaxis : PPI   Procedures  :     Left leg incision and drainage of hematoma by Dr. Lajoyce Corners on 01/24/2023      Disposition Plan  :    Status is: Inpatient   DVT Prophylaxis  :    SCDs Start: 01/24/23 1624 SCDs Start: 01/21/23 2059    Lab Results  Component Value Date   PLT 266 01/25/2023    Diet :  Diet Order             Diet Carb Modified Fluid consistency: Thin; Room service appropriate? Yes  Diet effective now                    Inpatient Medications  Scheduled Meds:  amiodarone  200 mg Oral Daily   bisacodyl  10 mg Rectal Once   carvedilol  3.125 mg Oral BID   digoxin  125 mcg Oral Daily   docusate sodium  200 mg Oral BID   DULoxetine  20 mg Oral Daily   gabapentin  100 mg Oral TID   hydrALAZINE  50 mg Oral Q8H   insulin aspart  0-9 Units Subcutaneous TID WC   magnesium hydroxide  30 mL Oral BID   pneumococcal 20-valent conjugate vaccine  0.5 mL Intramuscular Tomorrow-1000   polyethylene glycol  17 g Oral BID   rosuvastatin  20 mg Oral Daily   Continuous Infusions:  cefTRIAXone (ROCEPHIN)  IV 2 g (01/24/23 2135)   PRN Meds:.acetaminophen, cyclobenzaprine, dextrose, HYDROmorphone (DILAUDID) injection, magnesium citrate, melatonin, naLOXone (NARCAN)  injection, [DISCONTINUED] ondansetron **OR** ondansetron (ZOFRAN) IV, oxyCODONE, oxyCODONE, oxyCODONE-acetaminophen, polyethylene glycol, prochlorperazine  Antibiotics  :    Anti-infectives (From admission, onward)    Start      Dose/Rate Route Frequency Ordered Stop   01/24/23 2000  ceFAZolin (ANCEF) IVPB 2g/100 mL premix  Status:  Discontinued        2 g 200 mL/hr over 30 Minutes Intravenous Every 6 hours 01/24/23 1623 01/24/23 1653   01/24/23 1300  ceFAZolin (ANCEF) IVPB 2g/100 mL premix        2 g 200 mL/hr over 30 Minutes Intravenous On call to O.R. 01/23/23 1837 01/24/23 1427   01/23/23 0000  doxycycline (VIBRA-TABS) 100 MG tablet        100 mg Oral 2 times daily 01/23/23 0934     01/21/23 2015  cefTRIAXone (ROCEPHIN) 2 g in sodium chloride 0.9 % 100 mL IVPB        2 g 200 mL/hr over 30 Minutes Intravenous Every 24 hours 01/21/23 2007           Objective:   Vitals:   01/25/23 0000 01/25/23 0400 01/25/23 0604 01/25/23 0815  BP: 132/83 128/80 131/79 134/85  Pulse: 81 81  78  Resp: 14 14  11   Temp:  98.3 F (36.8 C)  98 F (36.7 C)  TempSrc:  Oral  Oral  SpO2: 92% 92%  97%  Weight:      Height:        Wt Readings from Last 3 Encounters:  01/24/23 107.5 kg  01/08/23 109.3 kg  07/05/22 116.1 kg     Intake/Output Summary (Last 24 hours) at 01/25/2023 0928 Last data filed at 01/25/2023 0200 Gross per 24 hour  Intake 740 ml  Output 1175 ml  Net -435 ml     Physical Exam  Awake Alert, No new F.N deficits, Normal affect Dorris.AT,PERRAL Supple Neck, No JVD,   Symmetrical Chest wall movement, Good air movement bilaterally, CTAB RRR,No Gallops,Rubs or new Murmurs,  +ve B.Sounds, Abd Soft, No tenderness,   Left leg mildly swollen much better than before, wound VAC in place   Data Review:    Recent Labs  Lab 01/21/23 1425 01/21/23 1434 01/22/23 0114 01/22/23 1322 01/23/23 0347 01/24/23 0323 01/25/23 0626  WBC 7.7  --  7.6  --  6.7 6.3 8.0  HGB 14.7   < > 14.2 12.4* 12.5* 12.5* 12.6*  HCT 47.3   < > 47.3 40.3 41.8 41.8 41.8  PLT 286  --  247  --  232 234 266  MCV 82.7  --  83.4  --  83.3 85.0 82.9  MCH 25.7*  --  25.0*  --  24.9* 25.4* 25.0*  MCHC 31.1  --  30.0  --  29.9*  29.9* 30.1  RDW 14.1  --  14.1  --  14.1 14.1 14.0  LYMPHSABS 2.1  --   --   --   --  1.7 1.5  MONOABS 0.6  --   --   --   --  0.8 0.4  EOSABS 0.2  --   --   --   --  0.2 0.0  BASOSABS 0.1  --   --   --   --  0.0 0.0   < > = values in this interval not displayed.    Recent Labs  Lab 01/21/23 1425 01/21/23 1434 01/22/23 0114 01/23/23 0347 01/23/23 0731 01/23/23 0742 01/24/23 0323 01/25/23 0626  NA 137 138 137 138  --   --  139 140  K 4.1 4.0 4.4 4.0  --   --  4.4 4.6  CL 97* 100 99 100  --   --  103 102  CO2 24  --  26 28  --   --  27 26  ANIONGAP 16*  --  12 10  --   --  9 12  GLUCOSE 110* 107* 102* 102*  --   --  87 110*  BUN 16 19 14 15   --   --  16 17  CREATININE 1.72* 1.80* 1.54* 1.61*  --   --  1.49* 1.38*  AST  --   --  17  17 13*  --   --  12* 12*  ALT  --   --  12  10 11   --   --  14 12  ALKPHOS  --   --  48  47 38  --   --  42 48  BILITOT  --   --  2.3*  2.3* 1.1  --   --  1.2 1.3*  ALBUMIN  --   --  3.6  3.6 3.1*  --   --  3.0* 3.1*  CRP  --   --   --   --   --  2.6* 3.2* 6.6*  PROCALCITON  --   --   --  <0.10  --   --  <0.10 0.10  INR 1.3*  --   --   --  1.2  --   --   --   HGBA1C  --   --  6.1*  --   --   --   --   --   BNP  --   --  12.3 6.6  --   --  6.0 22.2  MG  --   --  2.0 2.1  --   --  2.0 2.2  CALCIUM 9.4  --  9.1 8.8*  --   --  9.1 9.3      Recent Labs  Lab 01/21/23 1425 01/22/23 0114 01/23/23 0347 01/23/23 0731 01/23/23 0742 01/24/23 0323 01/25/23 0626  CRP  --   --   --   --  2.6* 3.2* 6.6*  PROCALCITON  --   --  <0.10  --   --  <0.10 0.10  INR 1.3*  --   --  1.2  --   --   --   HGBA1C  --  6.1*  --   --   --   --   --   BNP  --  12.3 6.6  --   --  6.0 22.2  MG  --  2.0 2.1  --   --  2.0 2.2  CALCIUM 9.4 9.1 8.8*  --   --  9.1 9.3    --------------------------------------------------------------------------------------------------------------- Lab Results  Component Value Date   TRIG 123 04/05/2021    Lab Results   Component Value Date   HGBA1C 6.1 (H) 01/22/2023   No results for input(s): "TSH", "T4TOTAL", "FREET4", "T3FREE", "THYROIDAB" in the last 72 hours. No results for input(s): "VITAMINB12", "FOLATE", "FERRITIN", "TIBC", "IRON", "RETICCTPCT" in the last 72 hours. ------------------------------------------------------------------------------------------------------------------ Cardiac Enzymes No results for input(s): "CKMB", "  TROPONINI", "MYOGLOBIN" in the last 168 hours.  Invalid input(s): "CK"  Micro Results Recent Results (from the past 240 hour(s))  MRSA Next Gen by PCR, Nasal     Status: None   Collection Time: 01/21/23  8:57 PM   Specimen: Nasal Mucosa; Nasal Swab  Result Value Ref Range Status   MRSA by PCR Next Gen NOT DETECTED NOT DETECTED Final    Comment: (NOTE) The GeneXpert MRSA Assay (FDA approved for NASAL specimens only), is one component of a comprehensive MRSA colonization surveillance program. It is not intended to diagnose MRSA infection nor to guide or monitor treatment for MRSA infections. Test performance is not FDA approved in patients less than 80 years old. Performed at [facility], [address] [place], [place] [address]   Culture, blood (Routine X 2) w Reflex to ID Panel     Status: None (Preliminary result)   Collection Time: 01/21/23  8:57 PM   Specimen: BLOOD  Result Value Ref Range Status   Specimen Description BLOOD BLOOD LEFT ARM  Final   Special Requests   Final    BOTTLES DRAWN AEROBIC AND ANAEROBIC Blood Culture adequate volume   Culture   Final    NO GROWTH 3 DAYS Performed at [facility], [address], [place], [place] [address]    Report Status PENDING  Incomplete  Culture, blood (Routine X 2) w Reflex to ID Panel     Status: None (Preliminary result)   Collection Time: 01/21/23  8:57 PM   Specimen: BLOOD  Result Value Ref Range Status   Specimen Description BLOOD BLOOD LEFT HAND  Final   Special Requests   Final     BOTTLES DRAWN AEROBIC AND ANAEROBIC Blood Culture results may not be optimal due to an inadequate volume of blood received in culture bottles   Culture   Final    NO GROWTH 3 DAYS Performed at [facility], [address] [place], [place] [address]    Report Status PENDING  Incomplete  SARS Coronavirus 2 by RT PCR (hospital order, performed in [facility] hospital lab) *cepheid single result test* Anterior Nasal Swab     Status: None   Collection Time: 01/21/23 10:02 PM   Specimen: Anterior Nasal Swab  Result Value Ref Range Status   SARS Coronavirus 2 by RT PCR NEGATIVE NEGATIVE Final    Comment: Performed at [facility], [address], [place], [place] [address]  Aerobic/Anaerobic Culture w Gram Stain (surgical/deep wound)     Status: None (Preliminary result)   Collection Time: 01/24/23  3:14 PM   Specimen: Path Tissue  Result Value Ref Range Status   Specimen Description TISSUE LEFT LEG  Final   Special Requests PT ON ROCEPHIN AND ANCEF  Final   Gram Stain NO WBC SEEN NO ORGANISMS SEEN   Final   Culture   Final    CULTURE REINCUBATED FOR BETTER GROWTH Performed at [facility], [address], [place], [place] [address]    Report Status PENDING  Incomplete    Radiology Reports CT ABDOMEN PELVIS WO CONTRAST  Result Date: 01/23/2023 CLINICAL DATA:  Acute abdominal pain. Metastatic colon carcinoma. * Tracking Code: BO * EXAM: CT ABDOMEN AND PELVIS WITHOUT CONTRAST TECHNIQUE: Multidetector CT imaging of the abdomen and pelvis was performed following the standard protocol without IV contrast. RADIATION DOSE REDUCTION: This exam was performed according to the departmental dose-optimization program which includes automated exposure control,  adjustment of the mA and/or kV according to patient size and/or use of iterative reconstruction technique. COMPARISON:  02/01/2022 FINDINGS: Lower chest: No acute findings. Hepatobiliary: Postop changes from prior left hepatectomy  again noted. No mass visualized on this unenhanced exam. Prior cholecystectomy. No evidence of biliary obstruction. Pancreas: No mass or inflammatory process visualized on this unenhanced exam. Spleen: Within normal limits in size. Small peripherally calcified splenic cysts remains stable. Adrenals/Urinary tract: Several small less than 5 mm left renal calculi are again seen. No evidence of urolithiasis or hydronephrosis. Unremarkable appearance of urinary bladder. Stomach/Bowel: No evidence of obstruction, inflammatory process, or abnormal fluid collections. Large colonic stool burden noted. Vascular/Lymphatic: No pathologically enlarged lymph nodes identified. No evidence of abdominal aortic aneurysm. Reproductive:  No mass or other significant abnormality. Other:  None. Musculoskeletal: No suspicious bone lesions identified. Right gluteal intramuscular lipoma again noted. IMPRESSION: No acute findings. Large colonic stool burden noted; recommend clinical correlation for possible constipation. No evidence of metastatic disease on this unenhanced exam. Left nephrolithiasis. No evidence of urolithiasis or hydronephrosis. Electronically Signed   By: Danae Orleans M.D.   On: 01/23/2023 13:21   DG Chest Port 1 View  Result Date: 01/23/2023 CLINICAL DATA:  Shortness of breath.  History of hypertension. EXAM: PORTABLE CHEST 1 VIEW COMPARISON:  CT 01/08/2023 FINDINGS: Stable cardiomediastinal contours. Lung volumes are low. No pleural fluid or airspace consolidation. Mild atelectasis noted in the lung bases. Visualized osseous structures are unremarkable. IMPRESSION: Low lung volumes and mild bibasilar atelectasis. Electronically Signed   By: Signa Kell M.D.   On: 01/23/2023 06:59   US Abdomen Complete  Result Date: 01/21/2023 CLINICAL DATA:  Abdominal pain. History of cholecystectomy and left hepatectomy. Colon cancer with metastases to the liver. EXAM: ABDOMEN ULTRASOUND COMPLETE COMPARISON:  CT abdomen and  pelvis 02/01/2022 FINDINGS: Gallbladder: Cholecystectomy. Common bile duct: Diameter: 4 mm.  No intrahepatic biliary dilation. Liver: Left hepatectomy. 5.8 x 5.3 x 4.4 cm solid heterogenous mass in the right hepatic lobe adjacent to the surgical margin of the left hepatectomy. Increased parenchymal echogenicity. Portal vein is patent on color Doppler imaging with normal direction of blood flow towards the liver. IVC: Not visualized. Pancreas: Not visualized. Spleen: Size and appearance within normal limits.  9.1 cm. Right Kidney: Length: 10.3 cm. Echogenicity within normal limits. No mass or hydronephrosis visualized. Left Kidney: Length: 10.6 cm. Echogenicity within normal limits. No mass or hydronephrosis visualized. Abdominal aorta: No aneurysm visualized. The mid and distal aorta were not visualized. Other findings: Midline structures not visualized due to bowel gas. IMPRESSION: Indeterminate 5.8 cm mass along the resection margin of the right hepatic lobe. CT abdomen pelvis with IV contrast is recommended for further evaluation. Electronically Signed   By: Minerva Fester M.D.   On: 01/21/2023 23:01   CT ANGIO LOWER EXT BILAT W &/OR WO CONTRAST  Result Date: 01/21/2023 CLINICAL DATA:  Lower extremity vascular trauma. Laceration and hematoma of the lateral aspect of the left calf. Patient on anticoagulation. EXAM: CT ANGIOGRAPHY OF ABDOMINAL AORTA WITH ILIOFEMORAL RUNOFF TECHNIQUE: Multidetector CT imaging of the abdomen, pelvis and lower extremities was performed using the standard protocol during bolus administration of intravenous contrast. Multiplanar CT image reconstructions and MIPs were obtained to evaluate the vascular anatomy. RADIATION DOSE REDUCTION: This exam was performed according to the departmental dose-optimization program which includes automated exposure control, adjustment of the mA and/or kV according to patient size and/or use of iterative reconstruction technique. CONTRAST:   OMNIPAQUE IOHEXOL  350 MG/ML SOLN COMPARISON:  None Available. FINDINGS: VASCULAR Aorta: Moderate atherosclerotic calcification of the visualized distal abdominal aorta. Celiac: Not included in the study. SMA: Not included in the images. Renals: Not included in the images. IMA: Not well opacified. RIGHT Lower Extremity Inflow: Mild atherosclerotic calcification. No aneurysmal dilatation or dissection. The iliac arteries are patent. Outflow: Common, superficial and profunda femoral arteries and the popliteal artery are patent without evidence of aneurysm, dissection, vasculitis or significant stenosis. Runoff: The anterior tibial arteries patent to the level of the ankle. The posterior tibial and fibular arteries are patent to the distal calf. Evaluation of the distal segments of these vessels is limited due to contrast washout. LEFT Lower Extremity Inflow: Mild atherosclerotic calcification. No aneurysmal dilatation or dissection. The iliac arteries are patent. Outflow: Common, superficial and profunda femoral arteries and the popliteal artery are patent without evidence of aneurysm, dissection, vasculitis or significant stenosis. Runoff: Patent three vessel runoff to the ankle. No traumatic arterial injury or evidence of active arterial bleed. Veins: No obvious venous abnormality within the limitations of this arterial phase study. Review of the MIP images confirms the above findings. NON-VASCULAR Lower chest: Not included. Hepatobiliary: Not included Pancreas: Not included Spleen: Not included. Adrenals/Urinary Tract: The kidneys are not included. The urinary bladder is unremarkable. Stomach/Bowel: Moderate stool in the visualized colon. No abnormal bowel loops in the pelvis. Lymphatic: No pelvic adenopathy. Reproductive: The prostate and seminal vesicles are grossly unremarkable. Other: There is laceration of the skin of the lateral aspect the distal left lower extremity. There is a 4.0 x 8.5 by 15.5 cm hematoma  in the superficial soft tissues of the lateral distal left lower extremity. No contrast extravasation or evidence of active bleed. Musculoskeletal: No acute osseous pathology. Small left suprapatellar effusion. IMPRESSION: 1. No traumatic arterial injury or evidence of active arterial bleed. 2. Large subcutaneous hematoma in the superficial soft tissues of the lateral distal left lower extremity. No contrast extravasation or evidence of active bleed. 3.  Aortic Atherosclerosis (ICD10-I70.0). Electronically Signed   By: Elgie Collard M.D.   On: 01/21/2023 18:58   DG Ankle Left Port  Result Date: 01/21/2023 CLINICAL DATA:  Pain after injury EXAM: PORTABLE LEFT ANKLE - 2 VIEW; PORTABLE LEFT TIBIA AND FIBULA - 2 VIEW COMPARISON:  None Available. FINDINGS: There is no evidence of fracture, dislocation, or joint effusion. There is no evidence of arthropathy or other focal bone abnormality. Soft tissue swelling about the ankle. There also a lobular area of soft tissue thickening posterolateral to the distal tib-fib region. Please correlate with the etiology including possible hematoma versus mass lesion. IMPRESSION: No fracture or dislocation. Soft tissue swelling about the ankle. There also a lobular area of soft tissue thickening posterolateral to the distal tib-fib region. Please correlate with the etiology including possible hematoma versus mass lesion. Additional workup as clinically appropriate or follow up to confirm resolution. Electronically Signed   By: Karen Kays M.D.   On: 01/21/2023 15:48   DG Tibia/Fibula Left Port  Result Date: 01/21/2023 CLINICAL DATA:  Pain after injury EXAM: PORTABLE LEFT ANKLE - 2 VIEW; PORTABLE LEFT TIBIA AND FIBULA - 2 VIEW COMPARISON:  None Available. FINDINGS: There is no evidence of fracture, dislocation, or joint effusion. There is no evidence of arthropathy or other focal bone abnormality. Soft tissue swelling about the ankle. There also a lobular area of soft tissue  thickening posterolateral to the distal tib-fib region. Please correlate with the etiology including possible hematoma versus  mass lesion. IMPRESSION: No fracture or dislocation. Soft tissue swelling about the ankle. There also a lobular area of soft tissue thickening posterolateral to the distal tib-fib region. Please correlate with the etiology including possible hematoma versus mass lesion. Additional workup as clinically appropriate or follow up to confirm resolution. Electronically Signed   By: Karen Kays M.D.   On: 01/21/2023 15:48      Signature  -   Susa Raring M.D on 01/25/2023 at 9:28 AM   -  To page go to [email or web address]

## 2023-01-25 NOTE — TOC Progression Note (Addendum)
Transition of Care [facility]) - Progression Note    Patient Details  Name: Raymond Allen MRN: [id number] Date of Birth: 1960/10/25  Transition of Care [facility]) CM/SW Contact  Lockie Pares, RN Phone Number: 01/25/2023, 2:34 PM  Clinical Narrative:     Patient will have provena on for a week at home. Refusing PT at this time. States he can walk fine attempted to call him on room phone no answer. Reached out to nursing.  Will attempt to reach patient as time allows. Reached out to SO, Ms.Rowell, who said she is with him most of the time and he will be declining HH. Offered to speak again if they were to change their minds. She will be coming in after work to be educated on [employer]. Let nursing know this information.      Expected Discharge Plan and Services         Expected Discharge Date: 01/23/23                                     Social Determinants of Health (SDOH) Interventions SDOH Screenings   Food Insecurity: Food Insecurity Present (01/22/2023)  Housing: Low Risk  (01/22/2023)  Transportation Needs: No Transportation Needs (01/22/2023)  Utilities: Not At Risk (01/22/2023)  Financial Resource Strain: Low Risk  (06/22/2022)   Received from [facility], [facility]  Social Connections: Unknown (06/15/2022)   Received from [facility], [facility]  Tobacco Use: Medium Risk (01/24/2023)    Readmission Risk Interventions     No data to display

## 2023-01-25 NOTE — Progress Notes (Signed)
PT Cancellation Note  Patient Details Name: Raymond Allen MRN: [id number] DOB: Jan 05, 1961   Cancelled Treatment:    Reason Eval/Treat Not Completed: Other (comment)  Patient just up to bathroom with OT. Was waiting for pt to call for assist to get out of bathroom, however pt walked himself back to bed per RN. Will attempt to see after pt has rested for incr ambulation and balance assessment (if pt will agree).    Jerolyn Center, PT Acute Rehabilitation Services  Office [phone]  Raymond Allen 01/25/2023, 10:16 AM

## 2023-01-25 NOTE — Evaluation (Signed)
Occupational Therapy Evaluation Patient Details Name: Raymond Allen MRN: [id number] DOB: 1960/09/06 Today's Date: 01/25/2023   History of Present Illness 62 y.o. male who presents to the ED from home after feeling poorly for the past 2 days, generalized weakness, and a fall.  He landed on his left lower extremity hitting the side of his trailer, resulting in a hematoma on his L lateral leg. He is now s/p LLE excisional debridement on 01/24/23. PMH significant for paroxysmal A-fib on Eliquis, HFrEF 30-35%, history of colon cancer with metastasis to the liver status post left hepatectomy, cholecystectomy, type 2 diabetes   Clinical Impression   Pt admitted for above, he presents with LLE pain which impacts his balance/gait but declines desire to use DME. Pt needing CGA to ambulate and has a hobbling gait. He requires Max A to total A for LB ADLs and setup assist for UB ADLs. Pt would benefit from continued acute skilled OT services to address deficits and help transition to next level of care. Anticipate no follow-up OT needed.        If plan is discharge home, recommend the following: A lot of help with bathing/dressing/bathroom;Assistance with cooking/housework    Functional Status Assessment  Patient has had a recent decline in their functional status and demonstrates the ability to make significant improvements in function in a reasonable and predictable amount of time.  Equipment Recommendations  None recommended by OT (pt has rec DME, recommend that he actually uses them)    Recommendations for Other Services       Precautions / Restrictions Restrictions Weight Bearing Restrictions: Yes LLE Weight Bearing: Weight bearing as tolerated      Mobility Bed Mobility Overal bed mobility: Needs Assistance Bed Mobility: Supine to Sit     Supine to sit: Supervision     General bed mobility comments: Pt left on toilet due to increased time needed for bathroom, instructed pt on the  use of call switch to receive help with getting back to bed    Transfers Overall transfer level: Needs assistance Equipment used: None Transfers: Sit to/from Stand Sit to Stand: Contact guard assist           General transfer comment: slow to rise      Balance Overall balance assessment: Needs assistance Sitting-balance support: Feet supported, No upper extremity supported Sitting balance-Leahy Scale: Good     Standing balance support: No upper extremity supported, During functional activity Standing balance-Leahy Scale: Fair Standing balance comment: hobbling gait                           ADL either performed or assessed with clinical judgement   ADL Overall ADL's : Needs assistance/impaired Eating/Feeding: Independent;Sitting   Grooming: Standing;Contact guard assist   Upper Body Bathing: Set up;Sitting   Lower Body Bathing: Sitting/lateral leans;Maximal assistance   Upper Body Dressing : Sitting;Set up   Lower Body Dressing: Sitting/lateral leans;Total assistance   Toilet Transfer: Contact guard assist;Ambulation   Toileting- Clothing Manipulation and Hygiene: Contact guard assist;Sitting/lateral lean       Functional mobility during ADLs: Contact guard assist General ADL Comments: Pt disregards desire for DME use     Vision         Perception         Praxis         Pertinent Vitals/Pain Pain Assessment Pain Assessment: Faces Faces Pain Scale: Hurts even more Pain Location: LLE Pain Descriptors /  Indicators: Aching, Discomfort, Grimacing, Guarding Pain Intervention(s): Limited activity within patient's tolerance, Monitored during session, Repositioned     Extremity/Trunk Assessment Upper Extremity Assessment Upper Extremity Assessment: Overall WFL for tasks assessed   Lower Extremity Assessment Lower Extremity Assessment: Defer to PT evaluation   Cervical / Trunk Assessment Cervical / Trunk Assessment: Normal    Communication Communication Communication: No apparent difficulties   Cognition Arousal: Alert Behavior During Therapy: WFL for tasks assessed/performed Overall Cognitive Status: Impaired/Different from baseline Area of Impairment: Safety/judgement                         Safety/Judgement: Decreased awareness of deficits, Decreased awareness of safety     General Comments: Pt moderately impulsive, disregards the need for help or DME use because he has been dealing with it for so long and states that a cane would just hurt him more than help him     General Comments  VSS on RA, HR up to 105 with mobility    Exercises     Shoulder Instructions      Home Living Family/patient expects to be discharged to:: Private residence Living Arrangements: Alone;Other (Comment) (reports not really, stays at several places apparantely) Available Help at Discharge: Friend(s) (girlfriend) Type of Home: House Home Access: Stairs to enter [employer] of Steps: 8-10 Entrance Stairs-Rails: Left;Right Home Layout: One level     Bathroom Shower/Tub: Tub/shower unit;Walk-in shower   Bathroom Toilet: Handicapped height     Home Equipment: Agricultural consultant (2 wheels);Cane - single point;Rollator (4 wheels);Wheelchair - manual   Additional Comments: sleeps in a chair/couch because he sleeps on his side      Prior Functioning/Environment Prior Level of Function : Independent/Modified Independent;Driving             Mobility Comments: ind no AD ADLs Comments: ind        OT Problem List: Impaired balance (sitting and/or standing);Pain      OT Treatment/Interventions: Self-care/ADL training;Balance training;Therapeutic exercise;Therapeutic activities;Patient/family education;DME and/or AE instruction    OT Goals(Current goals can be found in the care plan section) Acute Rehab OT Goals Patient Stated Goal: To go home OT Goal Formulation: With patient Time For Goal  Achievement: 02/08/23 Potential to Achieve Goals: Good ADL Goals Pt Will Perform Grooming: with modified independence;standing Pt Will Perform Lower Body Dressing: with set-up;with adaptive equipment;sitting/lateral leans Pt Will Transfer to Toilet: ambulating;with modified independence Pt Will Perform Tub/Shower Transfer: with modified independence  OT Frequency: Min 1X/week    Co-evaluation              AM-PAC OT "6 Clicks" Daily Activity     Outcome Measure Help from another person eating meals?: None Help from another person taking care of personal grooming?: A Little Help from another person toileting, which includes using toliet, bedpan, or urinal?: A Little Help from another person bathing (including washing, rinsing, drying)?: A Lot Help from another person to put on and taking off regular upper body clothing?: A Little Help from another person to put on and taking off regular lower body clothing?: Total 6 Click Score: 16   End of Session Nurse Communication: Mobility status  Activity Tolerance: Patient tolerated treatment well Patient left: Other (comment);with call bell/phone within reach (On toilet, instructions given to use call light)  OT Visit Diagnosis: Unsteadiness on feet (R26.81);Other abnormalities of gait and mobility (R26.89);Pain Pain - Right/Left: Left Pain - part of body: Leg;Ankle and joints of foot  Time: 2952-8413 OT Time Calculation (min): 18 min Charges:  OT General Charges $OT Visit: 1 Visit OT Evaluation $OT Eval Low Complexity: 1 Low  01/25/2023  AB, OTR/L  Acute Rehabilitation Services  Office: [phone]   Tristan Schroeder 01/25/2023, 10:48 AM

## 2023-01-25 NOTE — Consult Note (Signed)
WOC consulted for "clogged VAC" dressings, notified Dr. Lajoyce Corners of same.  To OR 10/09 for placement of Kerasis graft and placement of Peel & Place dressing (1 x per week dressing change product). Dr. Lajoyce Corners to advise staff on care of dressing.  Bedside nurse made aware of same.   Lenell Lama [facility], CNS, [employer] [phone]

## 2023-01-25 NOTE — Progress Notes (Signed)
PT Cancellation/Discharge Note  Patient Details Name: Raymond Allen MRN: [id number] DOB: [birth year]   Cancelled Treatment:    Reason Eval/Treat Not Completed: Patient declined, no reason specified  Patient appeared frustrated and would not make eye contact with therapist. Refuses PT eval stating he has no problems with walking or balance. Offered to come back later today or tomorrow and he stated that would not be necessary. He plans to discharge tomorrow.   PT signing off at patient's request.    Jerolyn Center, PT Acute Rehabilitation Services  Office [phone]  Zena Amos 01/25/2023, 1:46 PM

## 2023-01-26 ENCOUNTER — Other Ambulatory Visit (HOSPITAL_COMMUNITY): Payer: Self-pay

## 2023-01-26 LAB — CBC WITH DIFFERENTIAL/PLATELET
Abs Immature Granulocytes: 0.02 10*3/uL (ref 0.00–0.07)
Basophils Absolute: 0 10*3/uL (ref 0.0–0.1)
Basophils Relative: 0 %
Eosinophils Absolute: 0.1 10*3/uL (ref 0.0–0.5)
Eosinophils Relative: 1 %
HCT: 40.4 % (ref 39.0–52.0)
Hemoglobin: 12.2 g/dL — ABNORMAL LOW (ref 13.0–17.0)
Immature Granulocytes: 0 %
Lymphocytes Relative: 25 %
Lymphs Abs: 1.8 10*3/uL (ref 0.7–4.0)
MCH: 25.1 pg — ABNORMAL LOW (ref 26.0–34.0)
MCHC: 30.2 g/dL (ref 30.0–36.0)
MCV: 83.1 fL (ref 80.0–100.0)
Monocytes Absolute: 0.8 10*3/uL (ref 0.1–1.0)
Monocytes Relative: 11 %
Neutro Abs: 4.5 10*3/uL (ref 1.7–7.7)
Neutrophils Relative %: 63 %
Platelets: 282 10*3/uL (ref 150–400)
RBC: 4.86 MIL/uL (ref 4.22–5.81)
RDW: 14.1 % (ref 11.5–15.5)
WBC: 7.3 10*3/uL (ref 4.0–10.5)
nRBC: 0 % (ref 0.0–0.2)

## 2023-01-26 LAB — COMPREHENSIVE METABOLIC PANEL
ALT: 11 U/L (ref 0–44)
AST: 14 U/L — ABNORMAL LOW (ref 15–41)
Albumin: 3 g/dL — ABNORMAL LOW (ref 3.5–5.0)
Alkaline Phosphatase: 43 U/L (ref 38–126)
Anion gap: 9 (ref 5–15)
BUN: 20 mg/dL (ref 8–23)
CO2: 29 mmol/L (ref 22–32)
Calcium: 9 mg/dL (ref 8.9–10.3)
Chloride: 102 mmol/L (ref 98–111)
Creatinine, Ser: 1.4 mg/dL — ABNORMAL HIGH (ref 0.61–1.24)
GFR, Estimated: 56 mL/min — ABNORMAL LOW (ref >60)
Glucose, Bld: 101 mg/dL — ABNORMAL HIGH (ref 70–99)
Potassium: 4.2 mmol/L (ref 3.5–5.1)
Sodium: 140 mmol/L (ref 135–145)
Total Bilirubin: 0.8 mg/dL (ref 0.3–1.2)
Total Protein: 6.8 g/dL (ref 6.5–8.1)

## 2023-01-26 LAB — C-REACTIVE PROTEIN: CRP: 4.8 mg/dL — ABNORMAL HIGH (ref <1.0)

## 2023-01-26 LAB — CULTURE, BLOOD (ROUTINE X 2)
Culture: NO GROWTH
Culture: NO GROWTH
Special Requests: ADEQUATE

## 2023-01-26 LAB — GLUCOSE, CAPILLARY
Glucose-Capillary: 162 mg/dL — ABNORMAL HIGH (ref 70–99)
Glucose-Capillary: 132 mg/dL — ABNORMAL HIGH (ref 70–99)

## 2023-01-26 LAB — BRAIN NATRIURETIC PEPTIDE: B Natriuretic Peptide: 15.6 pg/mL (ref 0.0–100.0)

## 2023-01-26 LAB — MAGNESIUM: Magnesium: 2.2 mg/dL (ref 1.7–2.4)

## 2023-01-26 LAB — PROCALCITONIN: Procalcitonin: <0.1 ng/mL

## 2023-01-26 MED ORDER — DULOXETINE HCL 20 MG PO CPEP
20.0000 mg | ORAL_CAPSULE | Freq: Every day | ORAL | 0 refills | Status: AC
  Filled 2023-01-26: qty 30, 30d supply, fill #0

## 2023-01-26 MED ORDER — DOCUSATE SODIUM 100 MG PO CAPS
200.0000 mg | ORAL_CAPSULE | Freq: Two times a day (BID) | ORAL | 0 refills | Status: AC
  Filled 2023-01-26: qty 20, 5d supply, fill #0

## 2023-01-26 MED ORDER — POLYETHYLENE GLYCOL 3350 17 GM/SCOOP PO POWD
17.0000 g | Freq: Every day | ORAL | 0 refills | Status: AC
  Filled 2023-01-26: qty 238, 14d supply, fill #0

## 2023-01-26 MED ORDER — HYDRALAZINE HCL 50 MG PO TABS
50.0000 mg | ORAL_TABLET | Freq: Three times a day (TID) | ORAL | 0 refills | Status: DC
  Filled 2023-01-26: qty 90, 30d supply, fill #0

## 2023-01-26 MED ORDER — ISOSORBIDE MONONITRATE ER 30 MG PO TB24
30.0000 mg | ORAL_TABLET | Freq: Every day | ORAL | 0 refills | Status: DC
  Filled 2023-01-26: qty 30, 30d supply, fill #0

## 2023-01-26 MED ORDER — DOXYCYCLINE HYCLATE 100 MG PO TABS
100.0000 mg | ORAL_TABLET | Freq: Two times a day (BID) | ORAL | 0 refills | Status: DC
  Filled 2023-01-26: qty 14, 7d supply, fill #0

## 2023-01-26 MED ORDER — GABAPENTIN 100 MG PO CAPS
100.0000 mg | ORAL_CAPSULE | Freq: Two times a day (BID) | ORAL | 0 refills | Status: AC
  Filled 2023-01-26: qty 60, 30d supply, fill #0

## 2023-01-26 MED ORDER — OXYCODONE-ACETAMINOPHEN 7.5-325 MG PO TABS
1.0000 | ORAL_TABLET | Freq: Four times a day (QID) | ORAL | 0 refills | Status: AC | PRN
  Filled 2023-01-26: qty 20, 3d supply, fill #0

## 2023-01-26 NOTE — Progress Notes (Signed)
Pt refused to be discharged because wanted to discharge home with more pain medication. Pt was prescribed enough pain medication to last him until his f/u appt with PCP next week. Nurse explained to pt correct times and doses to take medication. Pt became argumentative with staff. Security called to escort pt out. Security never came but pt eventually left on his own. Pt refused wheelchair down to main entrance for discharge even after much encouragement to be transported down with one. Pt ambulated down to main entrance limping a long the way. All of pt belongings taken with pt. Discharge paperwork was given and reviewed with pt's significant other because pt refused paperwork.

## 2023-01-26 NOTE — Care Management Important Message (Signed)
Important Message  Patient Details  Name: Raymond Allen MRN: [id number] Date of Birth: [birth year]   Important Message Given:  Yes - Medicare IM     Dorena Bodo 01/26/2023, 3:25 PM

## 2023-01-26 NOTE — Plan of Care (Signed)

## 2023-01-26 NOTE — TOC Transition Note (Signed)
Transition of Care [facility]) - CM/SW Discharge Note   Patient Details  Name: REFORD OLLIFF MRN: [id number] Date of Birth: 1961/03/24  Transition of Care [facility]) CM/SW Contact:  Lockie Pares, RN Phone Number: 01/26/2023, 9:19 AM   Clinical Narrative:     Patient DC today with proveena. Declining HH . Follow up with MD  Final next level of care: Home/Self Care Barriers to Discharge: No Barriers Identified   Patient Goals and CMS Choice      Discharge Placement                 Home self care        Discharge Plan and Services Additional resources added to the After Visit Summary for                                       Social Determinants of Health (SDOH) Interventions SDOH Screenings   Food Insecurity: Food Insecurity Present (01/22/2023)  Housing: Low Risk  (01/22/2023)  Transportation Needs: No Transportation Needs (01/22/2023)  Utilities: Not At Risk (01/22/2023)  Financial Resource Strain: Low Risk  (06/22/2022)   Received from [facility], [facility]  Social Connections: Unknown (06/15/2022)   Received from [facility], [facility]  Tobacco Use: Medium Risk (01/24/2023)     Readmission Risk Interventions     No data to display

## 2023-01-26 NOTE — Care Management Important Message (Signed)
Important Message  Patient Details  Name: Raymond Allen MRN: [id number] Date of Birth: Jun 07, 1960   Important Message Given:  Yes - Medicare IM     Dorena Bodo 01/26/2023, 3:24 PM

## 2023-01-29 LAB — AEROBIC/ANAEROBIC CULTURE W GRAM STAIN (SURGICAL/DEEP WOUND): Gram Stain: NONE SEEN

## 2023-01-30 ENCOUNTER — Telehealth: Payer: Self-pay

## 2023-01-30 NOTE — Telephone Encounter (Signed)
Lindora called concerning wound vac.  Advised Lindora per [place]. that wound vac can remain off for 24hrs and will be taken off tomorrow during his appointment. Voiced that she understands.

## 2023-01-31 ENCOUNTER — Ambulatory Visit: Payer: 59 | Admitting: Family

## 2023-01-31 ENCOUNTER — Encounter: Payer: Self-pay | Admitting: Family

## 2023-01-31 DIAGNOSIS — S81812D Laceration without foreign body, left lower leg, subsequent encounter: Secondary | ICD-10-CM

## 2023-01-31 DIAGNOSIS — S8012XA Contusion of left lower leg, initial encounter: Secondary | ICD-10-CM | POA: Diagnosis not present

## 2023-01-31 DIAGNOSIS — N179 Acute kidney failure, unspecified: Secondary | ICD-10-CM

## 2023-01-31 NOTE — Progress Notes (Signed)
Post-Op Visit Note   Patient: Raymond Allen           Date of Birth: 10-04-1960           MRN: [id number] Visit Date: 01/31/2023 PCP: [place], Julie M, NP  Chief Complaint:  Chief Complaint  Patient presents with   Left Leg - Routine Post Op    01/24/23 left leg debridement    HPI:  HPI The patient is a 62 year old gentleman who is seen status post debridement of the left lower extremity October 9  Patient reports the wound VAC canister filled over the weekend and they turned it off  Ortho Exam On examination of the left lower extremity there is a lateral ulcer which has about 1 cm of depth there is no surrounding erythema there is scant serosanguineous drainage  Visit Diagnoses: No diagnosis found.  Plan: Instructed on doing Vashe to dry dressings with Ace wrap for compression elevate for swelling follow-up in 1 week  Follow-Up Instructions: No follow-ups on file.   Imaging: No results found.  Orders:  No orders of the defined types were placed in this encounter.  No orders of the defined types were placed in this encounter.    PMFS History: Patient Active Problem List   Diagnosis Date Noted   Hematoma of left lower leg 01/22/2023   Laceration of left lower extremity 01/22/2023   Wound cellulitis 01/22/2023   Cellulitis 01/21/2023   Chronic combined systolic and diastolic CHF (congestive heart failure) (HCC) 12/30/2021   Atrial fibrillation, chronic (HCC) 12/30/2021   Mixed hyperlipidemia 12/30/2021   Obesity (BMI 30-39.9) 12/30/2021   Pulmonary embolism (HCC) 12/29/2021   Physical deconditioning 10/14/2021   Acute systolic heart failure (HCC) 04/05/2021   Pulmonary edema 04/05/2021   Acute metabolic encephalopathy 04/05/2021   CKD (chronic kidney disease) stage 3, GFR 30-59 ml/min (HCC) 04/05/2021   Atrial arrhythmia 04/05/2021   Respiratory distress 03/29/2021   Heart failure, type unknown (HCC)    Type 2 diabetes mellitus without complication (HCC)     Incisional hernia 11/27/2017   Colon cancer metastasized to liver (HCC) 01/05/2012   FLANK PAIN, RIGHT 01/21/2008   TOBACCO ABUSE 12/26/2007   HYPERGLYCEMIA 12/26/2007   PERS HX NONCOMPLIANCE W/MED TX PRS HAZARDS HLTH 12/26/2007   CARCINOMA, COLON 12/17/2007   Essential hypertension 12/17/2007   Respiratory failure, acute (HCC) 12/17/2007   Past Medical History:  Diagnosis Date   Cancer [facility])    Colon cancer metastasized to liver (HCC) 01/05/2012   Colon carcinoma (HCC)    colon ca dx 2001;   Diabetes mellitus without complication (HCC)    Hypertension    Muscle weakness-general 01/05/2012    History reviewed. No pertinent family history.  Past Surgical History:  Procedure Laterality Date   APPLICATION OF WOUND VAC Left 01/24/2023   Procedure: APPLICATION OF WOUND VAC;  Surgeon: [name], MD;  Location: [facility] OR;  Service: Orthopedics;  Laterality: Left;   CHOLECYSTECTOMY     COLON SURGERY     sigmoid colectomy, appendectomy   I & D EXTREMITY Left 01/24/2023   Procedure: LEFT LEG DEBRIDEMENT;  Surgeon: [name], MD;  Location[facility] OR;  Service: Orthopedics;  Laterality: Left;   INCISIONAL HERNIA REPAIR N/A 11/27/2017   Procedure: LAPAROSCOPIC ASSISTED INCISIONAL HERNIA;  Surgeon: Berna Bue, MD;  Location: [facility] ORS;  Service: General;  Laterality: N/A;   INSERTION OF MESH N/A 11/27/2017   Procedure: INSERTION OF MESH;  Surgeon: Berna Bue,  MD;  Location: [facility] ORS;  Service: General;  Laterality: N/A;   LAPAROSCOPIC LYSIS OF ADHESIONS N/A 11/27/2017   Procedure: LAPAROSCOPIC LYSIS OF ADHESIONS;  Surgeon: Berna Bue, MD;  Location: [facility] ORS;  Service: General;  Laterality: N/A;   LIVER SURGERY     partial removal duke in 2003   PORT-A-CATH REMOVAL Right 07/16/2012   Procedure: REMOVAL PORT-A-CATH;  Surgeon: Currie Paris, MD;  Location: [facility];  Service: General;  Laterality: Right;   RIGHT/LEFT HEART CATH AND CORONARY ANGIOGRAPHY N/A  04/01/2021   Procedure: RIGHT/LEFT HEART CATH AND CORONARY ANGIOGRAPHY;  Surgeon: Dolores Patty, MD;  Location: [facility] INVASIVE CV LAB;  Service: Cardiovascular;  Laterality: N/A;   Social History   Occupational History   Not on file  Tobacco Use   Smoking status: Former    Current packs/day: 0.00    Average packs/day: 0.5 packs/day for 32.0 years (16.0 ttl pk-yrs)    Types: Cigarettes    Start date: 05/19/1983    Quit date: 05/19/2015    Years since quitting: 7.7   Smokeless tobacco: Never  Vaping Use   Vaping status: Never Used  Substance and Sexual Activity   Alcohol use: No   Drug use: No   Sexual activity: Not on file

## 2023-02-02 ENCOUNTER — Other Ambulatory Visit: Payer: Self-pay | Admitting: Specialist

## 2023-02-02 DIAGNOSIS — M5417 Radiculopathy, lumbosacral region: Secondary | ICD-10-CM

## 2023-02-08 ENCOUNTER — Telehealth: Payer: Self-pay | Admitting: Orthopedic Surgery

## 2023-02-08 NOTE — Telephone Encounter (Signed)
yes

## 2023-02-08 NOTE — Telephone Encounter (Signed)
Pt's wife asked when she bring pt for appt can we please show her how to properly wrap his leg

## 2023-02-09 ENCOUNTER — Ambulatory Visit (INDEPENDENT_AMBULATORY_CARE_PROVIDER_SITE_OTHER): Payer: 59 | Admitting: Family

## 2023-02-09 ENCOUNTER — Encounter: Payer: Self-pay | Admitting: Family

## 2023-02-09 DIAGNOSIS — S8012XA Contusion of left lower leg, initial encounter: Secondary | ICD-10-CM | POA: Diagnosis not present

## 2023-02-09 NOTE — Progress Notes (Signed)
Post-Op Visit Note   Patient: Raymond Allen           Date of Birth: 1960-08-20           MRN: [id number] Visit Date: 02/09/2023 PCP: [place], Julie M, NP  Chief Complaint:  Chief Complaint  Patient presents with   Left Leg - Routine Post Op    01/24/23 left leg debridement    HPI:  HPI The patient is a 62 year old gentleman who is seen in follow-up status post debridement of the left lower extremity October 9.  They have been doing daily dose of cleansing and dry dressings with Ace wrap.  He reports that he does not sleep much due to his chronic pain.  Legs dependent during the day Ortho Exam On examination left lower extremity the lateral ulcer is unchanged in size there is some increased fibrinous tissue with eschar debrided some with gauze in the office.  Back to bleeding granulation.  There is no surrounding erythema no odor no sign of infection  Visit Diagnoses: No diagnosis found.  Plan: Will place Dynaflex wrap silver cell in the wound bed discussed the importance of elevation  Follow-Up Instructions: Return in about 3 days (around 02/12/2023).   Imaging: No results found.  Orders:  No orders of the defined types were placed in this encounter.  No orders of the defined types were placed in this encounter.    PMFS History: Patient Active Problem List   Diagnosis Date Noted   AKI (acute kidney injury) (HCC) 01/31/2023   Hematoma of left lower leg 01/22/2023   Laceration of left lower extremity 01/22/2023   Wound cellulitis 01/22/2023   Cellulitis 01/21/2023   Chronic combined systolic and diastolic CHF (congestive heart failure) (HCC) 12/30/2021   Atrial fibrillation, chronic (HCC) 12/30/2021   Mixed hyperlipidemia 12/30/2021   Obesity (BMI 30-39.9) 12/30/2021   Pulmonary embolism (HCC) 12/29/2021   Physical deconditioning 10/14/2021   Acute systolic heart failure (HCC) 04/05/2021   Pulmonary edema 04/05/2021   Acute metabolic encephalopathy 04/05/2021    CKD (chronic kidney disease) stage 3, GFR 30-59 ml/min (HCC) 04/05/2021   Atrial arrhythmia 04/05/2021   Respiratory distress 03/29/2021   Heart failure, type unknown (HCC)    Type 2 diabetes mellitus without complication (HCC)    Incisional hernia 11/27/2017   Colon cancer metastasized to liver (HCC) 01/05/2012   FLANK PAIN, RIGHT 01/21/2008   TOBACCO ABUSE 12/26/2007   HYPERGLYCEMIA 12/26/2007   PERS HX NONCOMPLIANCE W/MED TX PRS HAZARDS HLTH 12/26/2007   CARCINOMA, COLON 12/17/2007   Essential hypertension 12/17/2007   Respiratory failure, acute (HCC) 12/17/2007   Past Medical History:  Diagnosis Date   Cancer [facility])    Colon cancer metastasized to liver (HCC) 01/05/2012   Colon carcinoma (HCC)    colon ca dx 2001;   Diabetes mellitus without complication (HCC)    Hypertension    Muscle weakness-general 01/05/2012    History reviewed. No pertinent family history.  Past Surgical History:  Procedure Laterality Date   APPLICATION OF WOUND VAC Left 01/24/2023   Procedure: APPLICATION OF WOUND VAC;  Surgeon: [name], MD;  Location: [facility] OR;  Service: Orthopedics;  Laterality: Left;   CHOLECYSTECTOMY     COLON SURGERY     sigmoid colectomy, appendectomy   I & D EXTREMITY Left 01/24/2023   Procedure: LEFT LEG DEBRIDEMENT;  Surgeon: [name], MD;  Location[facility] OR;  Service: Orthopedics;  Laterality: Left;   INCISIONAL HERNIA REPAIR N/A  11/27/2017   Procedure: LAPAROSCOPIC ASSISTED INCISIONAL HERNIA;  Surgeon: Berna Bue, MD;  Location: [facility]S;  Service: General;  Laterality: N/A;   INSERTION OF MESH N/A 11/27/2017   Procedure: INSERTION OF MESH;  Surgeon: Berna Bue, MD;  Location: [facility] ORS;  Service: General;  Laterality: N/A;   LAPAROSCOPIC LYSIS OF ADHESIONS N/A 11/27/2017   Procedure: LAPAROSCOPIC LYSIS OF ADHESIONS;  Surgeon: Berna Bue, MD;  Location: [facility] ORS;  Service: General;  Laterality: N/A;   LIVER SURGERY     partial removal duke in 2003    PORT-A-CATH REMOVAL Right 07/16/2012   Procedure: REMOVAL PORT-A-CATH;  Surgeon: Currie Paris, MD;  Location: [facility];  Service: General;  Laterality: Right;   RIGHT/LEFT HEART CATH AND CORONARY ANGIOGRAPHY N/A 04/01/2021   Procedure: RIGHT/LEFT HEART CATH AND CORONARY ANGIOGRAPHY;  Surgeon: Dolores Patty, MD;  Location: [facility]VAS[facility] CV LAB;  Service: Cardiovascular;  Laterality: N/A;   Social History   Occupational History   Not on file  Tobacco Use   Smoking status: Former    Current packs/day: 0.00    Average packs/day: 0.5 packs/day for 32.0 years (16.0 ttl pk-yrs)    Types: Cigarettes    Start date: 05/19/1983    Quit date: 05/19/2015    Years since quitting: 7.7   Smokeless tobacco: Never  Vaping Use   Vaping status: Never Used  Substance and Sexual Activity   Alcohol use: No   Drug use: No   Sexual activity: Not on file

## 2023-02-15 ENCOUNTER — Ambulatory Visit
Admission: RE | Admit: 2023-02-15 | Discharge: 2023-02-15 | Disposition: A | Discharge status: Home / Self Care | Payer: 59 | Source: Ambulatory Visit | Attending: Specialist | Admitting: Specialist

## 2023-02-15 DIAGNOSIS — M5417 Radiculopathy, lumbosacral region: Secondary | ICD-10-CM

## 2023-02-16 ENCOUNTER — Ambulatory Visit (INDEPENDENT_AMBULATORY_CARE_PROVIDER_SITE_OTHER): Payer: 59 | Admitting: Family

## 2023-02-16 ENCOUNTER — Encounter: Payer: Self-pay | Admitting: Family

## 2023-02-16 DIAGNOSIS — S8012XA Contusion of left lower leg, initial encounter: Secondary | ICD-10-CM

## 2023-02-16 NOTE — Progress Notes (Signed)
Post-Op Visit Note   Patient: Raymond Allen           Date of Birth: [birth year]           MRN: [id number] Visit Date: 02/16/2023 PCP: [place], Julie M, NP  Chief Complaint:  Chief Complaint  Patient presents with   Left Leg - Routine Post Op    01/24/23 left leg debridement    HPI:  HPI The patient is a 62 year old gentleman seen status post left lower extremity debridement with Kerecis placement October 9.  He has been in a Dynaflex compression wrap with silver cell to the wound for the last 1 week  he complains of some increase in his pain from the tightness of the wrap Ortho Exam On examination left lower extremity the lateral ulcer is markedly improved with increased granulation and bleeding tissue.  There is no eschar present.  There is about 5% fibrinous exudative tissue.  No surrounding erythema or odor  Visit Diagnoses: No diagnosis found.  Plan: Will continue with serial compression and Dynaflex today silver cell in the wound bed will follow-up in 1 week  Follow-Up Instructions: Return in about 1 week (around 02/23/2023).   Imaging: MR LUMBAR SPINE WO CONTRAST  Result Date: 02/16/2023 CLINICAL DATA:  Groin and back pain for 2 years EXAM: MRI LUMBAR SPINE WITHOUT CONTRAST TECHNIQUE: Multiplanar, multisequence MR imaging of the lumbar spine was performed. No intravenous contrast was administered. COMPARISON:  12/29/2022 FINDINGS: Segmentation:  Standard. Alignment:  Physiologic. Vertebrae:  No fracture, evidence of discitis, or bone lesion. Conus medullaris and cauda equina: Conus extends to the L1 level. Conus and cauda equina appear normal. Paraspinal and other soft tissues: Negative for perispinal mass or inflammation. Partially covered cluster of cysts at the right upper pole kidney with some hemosiderin or thin septation stable by lumbar MRI since at least 2020 and cyst decreased in size since 2014, benign findings. Disc levels: T12- L1: Unremarkable. L1-L2: Disc  narrowing and bulging.  Negative facets L2-L3: Disc narrowing and foraminal predominant bulging. Negative facets L3-L4: Disc narrowing and bulging. Mild endplate and facet spurring. Small left foraminal protrusion that is noncompressive L4-L5: Disc narrowing and bulging. Degenerative facet spurring that is mild to moderate. No neural compression. L5-S1:Disc narrowing and bulging.  Mild facet spurring. 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 Electronically Signed   By: Tiburcio Pea M.D.   On: 02/16/2023 07:57    Orders:  No orders of the defined types were placed in this encounter.  No orders of the defined types were placed in this encounter.    PMFS History: Patient Active Problem List   Diagnosis Date Noted   AKI (acute kidney injury) (HCC) 01/31/2023   Hematoma of left lower leg 01/22/2023   Laceration of left lower extremity 01/22/2023   Wound cellulitis 01/22/2023   Cellulitis 01/21/2023   Chronic combined systolic and diastolic CHF (congestive heart failure) (HCC) 12/30/2021   Atrial fibrillation, chronic (HCC) 12/30/2021   Mixed hyperlipidemia 12/30/2021   Obesity (BMI 30-39.9) 12/30/2021   Pulmonary embolism (HCC) 12/29/2021   Physical deconditioning 10/14/2021   Acute systolic heart failure (HCC) 04/05/2021   Pulmonary edema 04/05/2021   Acute metabolic encephalopathy 04/05/2021   CKD (chronic kidney disease) stage 3, GFR 30-59 ml/min (HCC) 04/05/2021   Atrial arrhythmia 04/05/2021   Respiratory distress 03/29/2021   Heart failure, type unknown (HCC)    Type 2 diabetes mellitus without complication (HCC)    Incisional hernia  11/27/2017   Colon cancer metastasized to liver (HCC) 01/05/2012   FLANK PAIN, RIGHT 01/21/2008   TOBACCO ABUSE 12/26/2007   HYPERGLYCEMIA 12/26/2007   PERS HX NONCOMPLIANCE W/MED TX PRS HAZARDS HLTH 12/26/2007   CARCINOMA, COLON 12/17/2007   Essential hypertension 12/17/2007    Respiratory failure, acute (HCC) 12/17/2007   Past Medical History:  Diagnosis Date   Cancer [facility])    Colon cancer metastasized to liver (HCC) 01/05/2012   Colon carcinoma (HCC)    colon ca dx 2001;   Diabetes mellitus without complication (HCC)    Hypertension    Muscle weakness-general 01/05/2012    History reviewed. No pertinent family history.  Past Surgical History:  Procedure Laterality Date   APPLICATION OF WOUND VAC Left 01/24/2023   Procedure: APPLICATION OF WOUND VAC;  Surgeon: [name], MD;  [facility];  Service: Orthopedics;  Laterality: Left;   CHOLECYSTECTOMY     COLON SURGERY     sigmoid colectomy, appendectomy   I & D EXTREMITY Left 01/24/2023   Procedure: LEFT LEG DEBRIDEMENT;  Surgeon: [name], MD;  Location: [facility] OR;  Service: Orthopedics;  Laterality: Left;   INCISIONAL HERNIA REPAIR N/A 11/27/2017   Procedure: LAPAROSCOPIC ASSISTED INCISIONAL HERNIA;  Surgeon: Berna Bue, MD;  [facility];  Service: General;  Laterality: N/A;   INSERTION OF MESH N/A 11/27/2017   Procedure: INSERTION OF MESH;  Surgeon: Berna Bue, MD;  [facility];  Service: General;  Laterality: N/A;   LAPAROSCOPIC LYSIS OF ADHESIONS N/A 11/27/2017   Procedure: LAPAROSCOPIC LYSIS OF ADHESIONS;  Surgeon: Berna Bue, MD;  [facility];  Service: General;  Laterality: N/A;   LIVER SURGERY     partial removal duke in 2003   PORT-A-CATH REMOVAL Right 07/16/2012   Procedure: REMOVAL PORT-A-CATH;  Surgeon: Currie Paris, MD;  Location: [facility];  Service: General;  Laterality: Right;   RIGHT/LEFT HEART CATH AND CORONARY ANGIOGRAPHY N/A 04/01/2021   Procedure: RIGHT/LEFT HEART CATH AND CORONARY ANGIOGRAPHY;  Surgeon: Dolores Patty, MD;  [facility];  Service: Cardiovascular;  Laterality: N/A;   Social History   Occupational History   Not on file  Tobacco Use   Smoking status: Former    Current packs/day: 0.00     Average packs/day: 0.5 packs/day for 32.0 years (16.0 ttl pk-yrs)    Types: Cigarettes    Start date: 05/19/1983    Quit date: 05/19/2015    Years since quitting: 7.7   Smokeless tobacco: Never  Vaping Use   Vaping status: Never Used  Substance and Sexual Activity   Alcohol use: No   Drug use: No   Sexual activity: Not on file

## 2023-02-17 ENCOUNTER — Other Ambulatory Visit (HOSPITAL_COMMUNITY): Payer: Self-pay | Admitting: Internal Medicine

## 2023-02-23 ENCOUNTER — Ambulatory Visit (INDEPENDENT_AMBULATORY_CARE_PROVIDER_SITE_OTHER): Payer: 59 | Admitting: Family

## 2023-02-23 ENCOUNTER — Encounter: Payer: Self-pay | Admitting: Family

## 2023-02-23 DIAGNOSIS — S8012XA Contusion of left lower leg, initial encounter: Secondary | ICD-10-CM | POA: Diagnosis not present

## 2023-02-23 DIAGNOSIS — L0390 Cellulitis, unspecified: Secondary | ICD-10-CM

## 2023-02-23 MED ORDER — SULFAMETHOXAZOLE-TRIMETHOPRIM 800-160 MG PO TABS: 1.0000 | ORAL_TABLET | Freq: Two times a day (BID) | ORAL | 0 refills | Status: DC

## 2023-02-23 NOTE — Progress Notes (Signed)
Post-Op Visit Note   Patient: Raymond Allen           Date of Birth: 1960/08/14           MRN: [id number] Visit Date: 02/23/2023 PCP: [place], Julie M, NP  Chief Complaint:  Chief Complaint  Patient presents with   Left Leg - Follow-up    HPI:  HPI The patient is a 62 year old gentleman seen in follow-up for EXTR left lower extremity debridement of a large ulcer laterally.  He did have Kerecis placed on October 9.  Has had 2 serial compression wraps with Dynaflex silver cell over the wound for the last 2 weeks.  They complain of an odor. Ortho Exam On examination left lower extremity the lateral ulcer continues to improve with increased granulation there is no eschar present today scant fibrinous tissue proximally.  There is no surrounding erythema or warmth  Visit Diagnoses: No diagnosis found.  Plan: Will increase dressing changes to twice a week he will return early next week for repeat evaluation and Dynaflex application.  Will place on a course of Bactrim today  Follow-Up Instructions: Return in about 6 days (around 03/01/2023).   Imaging: No results found.  Orders:  No orders of the defined types were placed in this encounter.  Meds ordered this encounter  Medications   sulfamethoxazole-trimethoprim (BACTRIM DS) 800-160 MG tablet    Sig: Take 1 tablet by mouth 2 (two) times daily.    Dispense:  20 tablet    Refill:  0     PMFS History: Patient Active Problem List   Diagnosis Date Noted   AKI (acute kidney injury) (HCC) 01/31/2023   Hematoma of left lower leg 01/22/2023   Laceration of left lower extremity 01/22/2023   Wound cellulitis 01/22/2023   Cellulitis 01/21/2023   Chronic combined systolic and diastolic CHF (congestive heart failure) (HCC) 12/30/2021   Atrial fibrillation, chronic (HCC) 12/30/2021   Mixed hyperlipidemia 12/30/2021   Obesity (BMI 30-39.9) 12/30/2021   Pulmonary embolism (HCC) 12/29/2021   Physical deconditioning 10/14/2021    Acute systolic heart failure (HCC) 04/05/2021   Pulmonary edema 04/05/2021   Acute metabolic encephalopathy 04/05/2021   CKD (chronic kidney disease) stage 3, GFR 30-59 ml/min (HCC) 04/05/2021   Atrial arrhythmia 04/05/2021   Respiratory distress 03/29/2021   Heart failure, type unknown (HCC)    Type 2 diabetes mellitus without complication (HCC)    Incisional hernia 11/27/2017   Colon cancer metastasized to liver (HCC) 01/05/2012   FLANK PAIN, RIGHT 01/21/2008   TOBACCO ABUSE 12/26/2007   HYPERGLYCEMIA 12/26/2007   PERS HX NONCOMPLIANCE W/MED TX PRS HAZARDS HLTH 12/26/2007   CARCINOMA, COLON 12/17/2007   Essential hypertension 12/17/2007   Respiratory failure, acute (HCC) 12/17/2007   Past Medical History:  Diagnosis Date   Cancer [facility])    Colon cancer metastasized to liver (HCC) 01/05/2012   Colon carcinoma (HCC)    colon ca dx 2001;   Diabetes mellitus without complication (HCC)    Hypertension    Muscle weakness-general 01/05/2012    History reviewed. No pertinent family history.  Past Surgical History:  Procedure Laterality Date   APPLICATION OF WOUND VAC Left 01/24/2023   Procedure: APPLICATION OF WOUND VAC;  Surgeon: [name], MD;  [facility];  Service: Orthopedics;  Laterality: Left;   CHOLECYSTECTOMY     COLON SURGERY     sigmoid colectomy, appendectomy   I & D EXTREMITY Left 01/24/2023   Procedure: LEFT LEG DEBRIDEMENT;  Surgeon: [name], MD;  Location: [facility] OR;  Service: Orthopedics;  Laterality: Left;   INCISIONAL HERNIA REPAIR N/A 11/27/2017   Procedure: LAPAROSCOPIC ASSISTED INCISIONAL HERNIA;  Surgeon: Berna Bue, MD;  [facility];  Service: General;  Laterality: N/A;   INSERTION OF MESH N/A 11/27/2017   Procedure: INSERTION OF MESH;  Surgeon: Berna Bue, MD;  [facility];  Service: General;  Laterality: N/A;   LAPAROSCOPIC LYSIS OF ADHESIONS N/A 11/27/2017   Procedure: LAPAROSCOPIC LYSIS OF ADHESIONS;  Surgeon: Berna Bue, MD;  [facility];  Service: General;  Laterality: N/A;   LIVER SURGERY     partial removal duke in 2003   PORT-A-CATH REMOVAL Right 07/16/2012   Procedure: REMOVAL PORT-A-CATH;  Surgeon: Currie Paris, MD;  Location: [facility];  Service: General;  Laterality: Right;   RIGHT/LEFT HEART CATH AND CORONARY ANGIOGRAPHY N/A 04/01/2021   Procedure: RIGHT/LEFT HEART CATH AND CORONARY ANGIOGRAPHY;  Surgeon: Dolores Patty, MD;  [facility];  Service: Cardiovascular;  Laterality: N/A;   Social History   Occupational History   Not on file  Tobacco Use   Smoking status: Former    Current packs/day: 0.00    Average packs/day: 0.5 packs/day for 32.0 years (16.0 ttl pk-yrs)    Types: Cigarettes    Start date: 05/19/1983    Quit date: 05/19/2015    Years since quitting: 7.7   Smokeless tobacco: Never  Vaping Use   Vaping status: Never Used  Substance and Sexual Activity   Alcohol use: No   Drug use: No   Sexual activity: Not on file

## 2023-03-02 ENCOUNTER — Encounter: Payer: 59 | Admitting: Family

## 2023-03-02 ENCOUNTER — Encounter: Payer: Self-pay | Admitting: Family

## 2023-03-02 ENCOUNTER — Ambulatory Visit (INDEPENDENT_AMBULATORY_CARE_PROVIDER_SITE_OTHER): Payer: 59 | Admitting: Family

## 2023-03-02 DIAGNOSIS — S8012XA Contusion of left lower leg, initial encounter: Secondary | ICD-10-CM | POA: Diagnosis not present

## 2023-03-02 NOTE — Progress Notes (Signed)
Post-Op Visit Note   Patient: Raymond Allen           Date of Birth: 1960/09/23           MRN: [id number] Visit Date: 03/02/2023 PCP: [place], Julie M, NP  Chief Complaint:  Chief Complaint  Patient presents with   Left Leg - Routine Post Op    01/24/2023 LLE debridement     HPI:  HPI The patient is a 62 year old gentleman seen in follow-up for EXTR left lower extremity debridement of a large ulcer laterally.  He did have Kerecis placed on October 9.  Has had serial compression wraps with Dynaflex silver cell over the wound for the last 3 weeks.   Ortho Exam On examination left lower extremity the lateral ulcer continues to improve with increased granulation there is no eschar present today.  There is circumferential epithelialization.  There is no surrounding erythema or warmth  Visit Diagnoses: No diagnosis found.  Plan: Will continue with dressing changes weekly he will return  next week for repeat evaluation and Dynaflex application.  Will continue a course of Bactrim   Follow-Up Instructions: Return in about 1 week (around 03/09/2023).   Imaging: No results found.  Orders:  No orders of the defined types were placed in this encounter.  No orders of the defined types were placed in this encounter.    PMFS History: Patient Active Problem List   Diagnosis Date Noted   AKI (acute kidney injury) (HCC) 01/31/2023   Hematoma of left lower leg 01/22/2023   Laceration of left lower extremity 01/22/2023   Wound cellulitis 01/22/2023   Cellulitis 01/21/2023   Chronic combined systolic and diastolic CHF (congestive heart failure) (HCC) 12/30/2021   Atrial fibrillation, chronic (HCC) 12/30/2021   Mixed hyperlipidemia 12/30/2021   Obesity (BMI 30-39.9) 12/30/2021   Pulmonary embolism (HCC) 12/29/2021   Physical deconditioning 10/14/2021   Acute systolic heart failure (HCC) 04/05/2021   Pulmonary edema 04/05/2021   Acute metabolic encephalopathy 04/05/2021   CKD  (chronic kidney disease) stage 3, GFR 30-59 ml/min (HCC) 04/05/2021   Atrial arrhythmia 04/05/2021   Respiratory distress 03/29/2021   Heart failure, type unknown (HCC)    Type 2 diabetes mellitus without complication (HCC)    Incisional hernia 11/27/2017   Colon cancer metastasized to liver (HCC) 01/05/2012   FLANK PAIN, RIGHT 01/21/2008   TOBACCO ABUSE 12/26/2007   HYPERGLYCEMIA 12/26/2007   PERS HX NONCOMPLIANCE W/MED TX PRS HAZARDS HLTH 12/26/2007   CARCINOMA, COLON 12/17/2007   Essential hypertension 12/17/2007   Respiratory failure, acute (HCC) 12/17/2007   Past Medical History:  Diagnosis Date   Cancer [facility])    Colon cancer metastasized to liver (HCC) 01/05/2012   Colon carcinoma (HCC)    colon ca dx 2001;   Diabetes mellitus without complication (HCC)    Hypertension    Muscle weakness-general 01/05/2012    History reviewed. No pertinent family history.  Past Surgical History:  Procedure Laterality Date   APPLICATION OF WOUND VAC Left 01/24/2023   Procedure: APPLICATION OF WOUND VAC;  Surgeon: [name], MD;  [facility];  Service: Orthopedics;  Laterality: Left;   CHOLECYSTECTOMY     COLON SURGERY     sigmoid colectomy, appendectomy   I & D EXTREMITY Left 01/24/2023   Procedure: LEFT LEG DEBRIDEMENT;  Surgeon: [name], MD;  Location: [facility] OR;  Service: Orthopedics;  Laterality: Left;   INCISIONAL HERNIA REPAIR N/A 11/27/2017   Procedure: LAPAROSCOPIC ASSISTED INCISIONAL  HERNIA;  Surgeon: Berna Bue, MD;  [facility];  Service: General;  Laterality: N/A;   INSERTION OF MESH N/A 11/27/2017   Procedure: INSERTION OF MESH;  Surgeon: Berna Bue, MD;  [facility];  Service: General;  Laterality: N/A;   LAPAROSCOPIC LYSIS OF ADHESIONS N/A 11/27/2017   Procedure: LAPAROSCOPIC LYSIS OF ADHESIONS;  Surgeon: Berna Bue, MD;  [facility];  Service: General;  Laterality: N/A;   LIVER SURGERY     partial removal duke in 2003    PORT-A-CATH REMOVAL Right 07/16/2012   Procedure: REMOVAL PORT-A-CATH;  Surgeon: Currie Paris, MD;  Location: [facility];  Service: General;  Laterality: Right;   RIGHT/LEFT HEART CATH AND CORONARY ANGIOGRAPHY N/A 04/01/2021   Procedure: RIGHT/LEFT HEART CATH AND CORONARY ANGIOGRAPHY;  Surgeon: Dolores Patty, MD;  [facility];  Service: Cardiovascular;  Laterality: N/A;   Social History   Occupational History   Not on file  Tobacco Use   Smoking status: Former    Current packs/day: 0.00    Average packs/day: 0.5 packs/day for 32.0 years (16.0 ttl pk-yrs)    Types: Cigarettes    Start date: 05/19/1983    Quit date: 05/19/2015    Years since quitting: 7.7   Smokeless tobacco: Never  Vaping Use   Vaping status: Never Used  Substance and Sexual Activity   Alcohol use: No   Drug use: No   Sexual activity: Not on file

## 2023-03-08 ENCOUNTER — Encounter: Payer: Self-pay | Admitting: Orthopedic Surgery

## 2023-03-08 ENCOUNTER — Ambulatory Visit (INDEPENDENT_AMBULATORY_CARE_PROVIDER_SITE_OTHER): Payer: 59 | Admitting: Orthopedic Surgery

## 2023-03-08 DIAGNOSIS — S81812D Laceration without foreign body, left lower leg, subsequent encounter: Secondary | ICD-10-CM

## 2023-03-08 DIAGNOSIS — S8012XA Contusion of left lower leg, initial encounter: Secondary | ICD-10-CM

## 2023-03-08 NOTE — Progress Notes (Addendum)
Office Visit Note   Patient: Raymond Allen           Date of Birth: [birth year]           MRN: [id number] Visit Date: 03/08/2023              Requested by: [place], Julie M, NP [address]  [place] [address] PCP: [place], Julie M, NP  Chief Complaint  Patient presents with   Left Leg - Routine Post Op    01/24/2023 LLE debridement and graft       HPI: Patient is a 62 year old gentleman who presents 6 weeks status post debridement large hematoma left lower extremity and application of tissue graft.  Patient has completed his course of Bactrim DS.  Patient states the wound size has decreased with decreased swelling.  Assessment & Plan: Visit Diagnoses:  1. Hematoma of left lower leg   2. Laceration of left lower extremity, subsequent encounter     Plan: Will start with Dial soap cleansing today a 4 x 4 gauze dampened with Vashe irrigation and an Ace wrap.  Recommended elevation exercise and compression.  Autumn explained to patient the registry study that we are involved with with Kerecis.  All questions were answered.  Patient signed informed consent.  Patient was given a document outlining the study as well as contact information and website information.  Follow-Up Instructions: Return in about 1 week (around 03/15/2023).   Ortho Exam  Patient is alert, oriented, no adenopathy, well-dressed, normal affect, normal respiratory effort. Examination the wound bed has healthy granulation tissue the wound bed is flat this measures 9 cm in diameter.  There is no cellulitis no purulent drainage.  Imaging: No results found.    Labs: Lab Results  Component Value Date   HGBA1C 6.1 (H) 01/22/2023   HGBA1C 7.4 (H) 12/30/2021   HGBA1C 7.7 (H) 03/29/2021   CRP 4.8 (H) 01/26/2023   CRP 6.6 (H) 01/25/2023   CRP 3.2 (H) 01/24/2023   LABURIC 8.2 01/23/2023   REPTSTATUS 01/29/2023 FINAL 01/24/2023   GRAMSTAIN NO WBC SEEN NO ORGANISMS SEEN  01/24/2023    CULT  01/24/2023    RARE STAPHYLOCOCCUS SIMULANS RARE BACILLUS SPECIES Standardized susceptibility testing for this organism is not available. NO ANAEROBES ISOLATED Performed at [facility], [address], [place], [place] [address]    [facility] STAPHYLOCOCCUS SIMULANS 01/24/2023     Lab Results  Component Value Date   ALBUMIN 3.0 (L) 01/26/2023   ALBUMIN 3.1 (L) 01/25/2023   ALBUMIN 3.0 (L) 01/24/2023    Lab Results  Component Value Date   MG 2.2 01/26/2023   MG 2.2 01/25/2023   MG 2.0 01/24/2023   No results found for: "VD25OH"  No results found for: "PREALBUMIN"    Latest Ref Rng & Units 01/26/2023    3:52 AM 01/25/2023    6:26 AM 01/24/2023    3:23 AM  CBC EXTENDED  WBC 4.0 - 10.5 K/uL 7.3  8.0  6.3   RBC 4.22 - 5.81 MIL/uL 4.86  5.04  4.92   Hemoglobin 13.0 - 17.0 g/dL 82.9  56.2  13.0   HCT 39.0 - 52.0 % 40.4  41.8  41.8   Platelets 150 - 400 K/uL 282  266  234   NEUT# 1.7 - 7.7 K/uL 4.5  6.1  3.5   Lymph# 0.7 - 4.0 K/uL 1.8  1.5  1.7      There is no  height or weight on file to calculate BMI.  Orders:  No orders of the defined types were placed in this encounter.  No orders of the defined types were placed in this encounter.    Procedures: No procedures performed  Clinical Data: No additional findings.  ROS:  All other systems negative, except as noted in the HPI. Review of Systems  Objective: Vital Signs: There were no vitals taken for this visit.  Specialty Comments:  No specialty comments available.  PMFS History: Patient Active Problem List   Diagnosis Date Noted   AKI (acute kidney injury) (HCC) 01/31/2023   Hematoma of left lower leg 01/22/2023   Laceration of left lower extremity 01/22/2023   Wound cellulitis 01/22/2023   Cellulitis 01/21/2023   Chronic combined systolic and diastolic CHF (congestive heart failure) (HCC) 12/30/2021   Atrial fibrillation, chronic (HCC) 12/30/2021   Mixed hyperlipidemia 12/30/2021    Obesity (BMI 30-39.9) 12/30/2021   Pulmonary embolism (HCC) 12/29/2021   Physical deconditioning 10/14/2021   Acute systolic heart failure (HCC) 04/05/2021   Pulmonary edema 04/05/2021   Acute metabolic encephalopathy 04/05/2021   CKD (chronic kidney disease) stage 3, GFR 30-59 ml/min (HCC) 04/05/2021   Atrial arrhythmia 04/05/2021   Respiratory distress 03/29/2021   Heart failure, type unknown (HCC)    Type 2 diabetes mellitus without complication (HCC)    Incisional hernia 11/27/2017   Colon cancer metastasized to liver (HCC) 01/05/2012   FLANK PAIN, RIGHT 01/21/2008   TOBACCO ABUSE 12/26/2007   HYPERGLYCEMIA 12/26/2007   PERS HX NONCOMPLIANCE W/MED TX PRS HAZARDS HLTH 12/26/2007   CARCINOMA, COLON 12/17/2007   Essential hypertension 12/17/2007   Respiratory failure, acute (HCC) 12/17/2007   Past Medical History:  Diagnosis Date   Cancer [facility])    Colon cancer metastasized to liver (HCC) 01/05/2012   Colon carcinoma (HCC)    colon ca dx 2001;   Diabetes mellitus without complication (HCC)    Hypertension    Muscle weakness-general 01/05/2012    History reviewed. No pertinent family history.  Past Surgical History:  Procedure Laterality Date   APPLICATION OF WOUND VAC Left 01/24/2023   Procedure: APPLICATION OF WOUND VAC;  Surgeon: [name], MD;  [facility];  Service: Orthopedics;  Laterality: Left;   CHOLECYSTECTOMY     COLON SURGERY     sigmoid colectomy, appendectomy   I & D EXTREMITY Left 01/24/2023   Procedure: LEFT LEG DEBRIDEMENT;  Surgeon: [name], MD;  Location: [facility] OR;  Service: Orthopedics;  Laterality: Left;   INCISIONAL HERNIA REPAIR N/A 11/27/2017   Procedure: LAPAROSCOPIC ASSISTED INCISIONAL HERNIA;  Surgeon: Berna Bue, MD;  [facility];  Service: General;  Laterality: N/A;   INSERTION OF MESH N/A 11/27/2017   Procedure: INSERTION OF MESH;  Surgeon: Berna Bue, MD;  [facility];  Service: General;  Laterality: N/A;    LAPAROSCOPIC LYSIS OF ADHESIONS N/A 11/27/2017   Procedure: LAPAROSCOPIC LYSIS OF ADHESIONS;  Surgeon: Berna Bue, MD;  [facility];  Service: General;  Laterality: N/A;   LIVER SURGERY     partial removal duke in 2003   PORT-A-CATH REMOVAL Right 07/16/2012   Procedure: REMOVAL PORT-A-CATH;  Surgeon: Currie Paris, MD;  Location: [facility];  Service: General;  Laterality: Right;   RIGHT/LEFT HEART CATH AND CORONARY ANGIOGRAPHY N/A 04/01/2021   Procedure: RIGHT/LEFT HEART CATH AND CORONARY ANGIOGRAPHY;  Surgeon: Dolores Patty, MD;  [facility];  Service: Cardiovascular;  Laterality: N/A;   Social History   Occupational History   Not on file  Tobacco Use   Smoking status: Former    Current packs/day: 0.00    Average packs/day: 0.5 packs/day for 32.0 years (16.0 ttl pk-yrs)    Types: Cigarettes    Start date: 05/19/1983    Quit date: 05/19/2015    Years since quitting: 7.8   Smokeless tobacco: Never  Vaping Use   Vaping status: Never Used  Substance and Sexual Activity   Alcohol use: No   Drug use: No   Sexual activity: Not on file

## 2023-03-13 ENCOUNTER — Ambulatory Visit (INDEPENDENT_AMBULATORY_CARE_PROVIDER_SITE_OTHER): Payer: 59 | Admitting: Orthopedic Surgery

## 2023-03-13 DIAGNOSIS — S8012XA Contusion of left lower leg, initial encounter: Secondary | ICD-10-CM

## 2023-03-20 ENCOUNTER — Ambulatory Visit (INDEPENDENT_AMBULATORY_CARE_PROVIDER_SITE_OTHER): Payer: 59 | Admitting: Orthopedic Surgery

## 2023-03-20 ENCOUNTER — Encounter: Payer: Self-pay | Admitting: Orthopedic Surgery

## 2023-03-20 DIAGNOSIS — S8012XA Contusion of left lower leg, initial encounter: Secondary | ICD-10-CM

## 2023-03-20 NOTE — Progress Notes (Signed)
Office Visit Note   Patient: Raymond Allen           Date of Birth: 26-Apr-1960           MRN: [id number] Visit Date: 03/13/2023              Requested by: [place], Julie M, NP [address],  [place] [address] PCP: [place], Julie M, NP  Chief Complaint  Patient presents with   Left Leg - Routine Post Op    01/24/2023 LLE debridement and graft      HPI: Patient is a 62 year old gentleman who is seen 2 weeks status post debridement hematoma left leg and application of Kerecis tissue graft.  Patient is using Vashe and dry dressing changes.  Patient has enrolled in the [employer] study.  Assessment & Plan: Visit Diagnoses:  1. Hematoma of left lower leg     Plan: Patient will continue with Vashe dressing changes daily.  Discussed that we may be able to put the compression sock on her at follow-up.  Patient has Vashe at home.  Follow-Up Instructions: Return in about 1 week (around 03/20/2023).   Ortho Exam  Patient is alert, oriented, no adenopathy, well-dressed, normal affect, normal respiratory effort. Examination patient has good pulses.  The wound measures 7 cm with healthy granulation tissue.  Patient has decreased sensation over the lateral aspect of the leg.    Imaging: No results found. No images are attached to the encounter.  Labs: Lab Results  Component Value Date   HGBA1C 6.1 (H) 01/22/2023   HGBA1C 7.4 (H) 12/30/2021   HGBA1C 7.7 (H) 03/29/2021   CRP 4.8 (H) 01/26/2023   CRP 6.6 (H) 01/25/2023   CRP 3.2 (H) 01/24/2023   LABURIC 8.2 01/23/2023   REPTSTATUS 01/29/2023 FINAL 01/24/2023   GRAMSTAIN NO WBC SEEN NO ORGANISMS SEEN  01/24/2023   CULT  01/24/2023    RARE STAPHYLOCOCCUS SIMULANS RARE BACILLUS SPECIES Standardized susceptibility testing for this organism is not available. NO ANAEROBES ISOLATED Performed at [facility], [address], [place], [place] [address]    [facility] STAPHYLOCOCCUS SIMULANS 01/24/2023      Lab Results  Component Value Date   ALBUMIN 3.0 (L) 01/26/2023   ALBUMIN 3.1 (L) 01/25/2023   ALBUMIN 3.0 (L) 01/24/2023    Lab Results  Component Value Date   MG 2.2 01/26/2023   MG 2.2 01/25/2023   MG 2.0 01/24/2023   No results found for: "VD25OH"  No results found for: "PREALBUMIN"    Latest Ref Rng & Units 01/26/2023    3:52 AM 01/25/2023    6:26 AM 01/24/2023    3:23 AM  CBC EXTENDED  WBC 4.0 - 10.5 K/uL 7.3  8.0  6.3   RBC 4.22 - 5.81 MIL/uL 4.86  5.04  4.92   Hemoglobin 13.0 - 17.0 g/dL 61.6  07.3  71.0   HCT 39.0 - 52.0 % 40.4  41.8  41.8   Platelets 150 - 400 K/uL 282  266  234   NEUT# 1.7 - 7.7 K/uL 4.5  6.1  3.5   Lymph# 0.7 - 4.0 K/uL 1.8  1.5  1.7      There is no height or weight on file to calculate BMI.  Orders:  No orders of the defined types were placed in this encounter.  No orders of the defined types were placed in this encounter.    Procedures: No procedures performed  Clinical Data: No  additional findings.  ROS:  All other systems negative, except as noted in the HPI. Review of Systems  Objective: Vital Signs: There were no vitals taken for this visit.  Specialty Comments:  No specialty comments available.  PMFS History: Patient Active Problem List   Diagnosis Date Noted   AKI (acute kidney injury) (HCC) 01/31/2023   Hematoma of left lower leg 01/22/2023   Laceration of left lower extremity 01/22/2023   Wound cellulitis 01/22/2023   Cellulitis 01/21/2023   Chronic combined systolic and diastolic CHF (congestive heart failure) (HCC) 12/30/2021   Atrial fibrillation, chronic (HCC) 12/30/2021   Mixed hyperlipidemia 12/30/2021   Obesity (BMI 30-39.9) 12/30/2021   Pulmonary embolism (HCC) 12/29/2021   Physical deconditioning 10/14/2021   Acute systolic heart failure (HCC) 04/05/2021   Pulmonary edema 04/05/2021   Acute metabolic encephalopathy 04/05/2021   CKD (chronic kidney disease) stage 3, GFR 30-59 ml/min (HCC)  04/05/2021   Atrial arrhythmia 04/05/2021   Respiratory distress 03/29/2021   Heart failure, type unknown (HCC)    Type 2 diabetes mellitus without complication (HCC)    Incisional hernia 11/27/2017   Colon cancer metastasized to liver (HCC) 01/05/2012   FLANK PAIN, RIGHT 01/21/2008   TOBACCO ABUSE 12/26/2007   HYPERGLYCEMIA 12/26/2007   PERS HX NONCOMPLIANCE W/MED TX PRS HAZARDS HLTH 12/26/2007   CARCINOMA, COLON 12/17/2007   Essential hypertension 12/17/2007   Respiratory failure, acute (HCC) 12/17/2007   Past Medical History:  Diagnosis Date   Cancer [facility])    Colon cancer metastasized to liver (HCC) 01/05/2012   Colon carcinoma (HCC)    colon ca dx 2001;   Diabetes mellitus without complication (HCC)    Hypertension    Muscle weakness-general 01/05/2012    History reviewed. No pertinent family history.  Past Surgical History:  Procedure Laterality Date   APPLICATION OF WOUND VAC Left 01/24/2023   Procedure: APPLICATION OF WOUND VAC;  Surgeon: [name], MD;  [facility] OR;  Service: Orthopedics;  Laterality: Left;   CHOLECYSTECTOMY     COLON SURGERY     sigmoid colectomy, appendectomy   I & D EXTREMITY Left 01/24/2023   Procedure: LEFT LEG DEBRIDEMENT;  Surgeon: [name], MD;  Location: [facility] OR;  Service: Orthopedics;  Laterality: Left;   INCISIONAL HERNIA REPAIR N/A 11/27/2017   Procedure: LAPAROSCOPIC ASSISTED INCISIONAL HERNIA;  Surgeon: Berna Bue, MD;  Location: [facility];  Service: General;  Laterality: N/A;   INSERTION OF MESH N/A 11/27/2017   Procedure: INSERTION OF MESH;  Surgeon: Berna Bue, MD;  [facility]: [facility];  Service: General;  Laterality: N/A;   LAPAROSCOPIC LYSIS OF ADHESIONS N/A 11/27/2017   Procedure: LAPAROSCOPIC LYSIS OF ADHESIONS;  Surgeon: Berna Bue, MD;  [facility];  Service: General;  Laterality: N/A;   LIVER SURGERY     partial removal duke in 2003   PORT-A-CATH REMOVAL Right 07/16/2012   Procedure: REMOVAL  PORT-A-CATH;  Surgeon: Currie Paris, MD;  Location: [facility];  Service: General;  Laterality: Right;   RIGHT/LEFT HEART CATH AND CORONARY ANGIOGRAPHY N/A 04/01/2021   Procedure: RIGHT/LEFT HEART CATH AND CORONARY ANGIOGRAPHY;  Surgeon: Dolores Patty, MD;  [facility];  Service: Cardiovascular;  Laterality: N/A;   Social History   Occupational History   Not on file  Tobacco Use   Smoking status: Former    Current packs/day: 0.00    Average packs/day: 0.5 packs/day for 32.0 years (16.0 ttl pk-yrs)  Types: Cigarettes    Start date: 05/19/1983    Quit date: 05/19/2015    Years since quitting: 7.8   Smokeless tobacco: Never  Vaping Use   Vaping status: Never Used  Substance and Sexual Activity   Alcohol use: No   Drug use: No   Sexual activity: Not on file

## 2023-03-21 ENCOUNTER — Other Ambulatory Visit (HOSPITAL_COMMUNITY): Payer: Self-pay | Admitting: Internal Medicine

## 2023-03-21 ENCOUNTER — Encounter: Payer: Self-pay | Admitting: Orthopedic Surgery

## 2023-03-21 NOTE — Progress Notes (Signed)
Office Visit Note   Patient: Raymond Allen           Date of Birth: 17-May-1960           MRN: [id number] Visit Date: 03/20/2023              Requested by: [place], Julie M, NP [address]  [place] [address] PCP: [place], Julie M, NP  Chief Complaint  Patient presents with   Left Leg - Routine Post Op    01/24/2023 LLE debridement and graft      HPI: Patient is a 62 year old gentleman status post debridement left leg wound on October 9.  He is currently using Vashe for dressing changes and a compression sock.  Assessment & Plan: Visit Diagnoses:  1. Hematoma of left lower leg     Plan: Patient will continue with routine wound care and compression.  Follow-Up Instructions: Return in about 4 weeks (around 04/17/2023).   Ortho Exam  Patient is alert, oriented, no adenopathy, well-dressed, normal affect, normal respiratory effort. Examination the wound bed has improved healthy granulation tissue.  Essentially 100% healthy granulation tissue at this time the wound measures 7 x 7 cm.  Patient states he does not have any leg pain but does have chronic back pain.  He has a strong dorsalis pedis pulse.  Imaging: No results found.   Labs: Lab Results  Component Value Date   HGBA1C 6.1 (H) 01/22/2023   HGBA1C 7.4 (H) 12/30/2021   HGBA1C 7.7 (H) 03/29/2021   CRP 4.8 (H) 01/26/2023   CRP 6.6 (H) 01/25/2023   CRP 3.2 (H) 01/24/2023   LABURIC 8.2 01/23/2023   REPTSTATUS 01/29/2023 FINAL 01/24/2023   GRAMSTAIN NO WBC SEEN NO ORGANISMS SEEN  01/24/2023   CULT  01/24/2023    RARE STAPHYLOCOCCUS SIMULANS RARE BACILLUS SPECIES Standardized susceptibility testing for this organism is not available. NO ANAEROBES ISOLATED Performed at [facility], [address], [place], [place] [address]    [facility] STAPHYLOCOCCUS SIMULANS 01/24/2023     Lab Results  Component Value Date   ALBUMIN 3.0 (L) 01/26/2023   ALBUMIN 3.1 (L) 01/25/2023    ALBUMIN 3.0 (L) 01/24/2023    Lab Results  Component Value Date   MG 2.2 01/26/2023   MG 2.2 01/25/2023   MG 2.0 01/24/2023   No results found for: "VD25OH"  No results found for: "PREALBUMIN"    Latest Ref Rng & Units 01/26/2023    3:52 AM 01/25/2023    6:26 AM 01/24/2023    3:23 AM  CBC EXTENDED  WBC 4.0 - 10.5 K/uL 7.3  8.0  6.3   RBC 4.22 - 5.81 MIL/uL 4.86  5.04  4.92   Hemoglobin 13.0 - 17.0 g/dL 82.9  56.2  13.0   HCT 39.0 - 52.0 % 40.4  41.8  41.8   Platelets 150 - 400 K/uL 282  266  234   NEUT# 1.7 - 7.7 K/uL 4.5  6.1  3.5   Lymph# 0.7 - 4.0 K/uL 1.8  1.5  1.7      There is no height or weight on file to calculate BMI.  Orders:  No orders of the defined types were placed in this encounter.  No orders of the defined types were placed in this encounter.    Procedures: No procedures performed  Clinical Data: No additional findings.  ROS:  All other systems negative, except as noted in the HPI. Review of Systems  Objective: Vital Signs: There were no vitals taken for this visit.  Specialty Comments:  No specialty comments available.  PMFS History: Patient Active Problem List   Diagnosis Date Noted   AKI (acute kidney injury) (HCC) 01/31/2023   Hematoma of left lower leg 01/22/2023   Laceration of left lower extremity 01/22/2023   Wound cellulitis 01/22/2023   Cellulitis 01/21/2023   Chronic combined systolic and diastolic CHF (congestive heart failure) (HCC) 12/30/2021   Atrial fibrillation, chronic (HCC) 12/30/2021   Mixed hyperlipidemia 12/30/2021   Obesity (BMI 30-39.9) 12/30/2021   Pulmonary embolism (HCC) 12/29/2021   Physical deconditioning 10/14/2021   Acute systolic heart failure (HCC) 04/05/2021   Pulmonary edema 04/05/2021   Acute metabolic encephalopathy 04/05/2021   CKD (chronic kidney disease) stage 3, GFR 30-59 ml/min (HCC) 04/05/2021   Atrial arrhythmia 04/05/2021   Respiratory distress 03/29/2021   Heart failure, type  unknown (HCC)    Type 2 diabetes mellitus without complication (HCC)    Incisional hernia 11/27/2017   Colon cancer metastasized to liver (HCC) 01/05/2012   FLANK PAIN, RIGHT 01/21/2008   TOBACCO ABUSE 12/26/2007   HYPERGLYCEMIA 12/26/2007   PERS HX NONCOMPLIANCE W/MED TX PRS HAZARDS HLTH 12/26/2007   CARCINOMA, COLON 12/17/2007   Essential hypertension 12/17/2007   Respiratory failure, acute (HCC) 12/17/2007   Past Medical History:  Diagnosis Date   Cancer [facility])    Colon cancer metastasized to liver (HCC) 01/05/2012   Colon carcinoma (HCC)    colon ca dx 2001;   Diabetes mellitus without complication (HCC)    Hypertension    Muscle weakness-general 01/05/2012    History reviewed. No pertinent family history.  Past Surgical History:  Procedure Laterality Date   APPLICATION OF WOUND VAC Left 01/24/2023   Procedure: APPLICATION OF WOUND VAC;  Surgeon: [name], MD;  [facility];  Service: Orthopedics;  Laterality: Left;   CHOLECYSTECTOMY     COLON SURGERY     sigmoid colectomy, appendectomy   I & D EXTREMITY Left 01/24/2023   Procedure: LEFT LEG DEBRIDEMENT;  Surgeon: [name], MD;  Location: [facility] OR;  Service: Orthopedics;  Laterality: Left;   INCISIONAL HERNIA REPAIR N/A 11/27/2017   Procedure: LAPAROSCOPIC ASSISTED INCISIONAL HERNIA;  Surgeon: Berna Bue, MD;  [facility];  Service: General;  Laterality: N/A;   INSERTION OF MESH N/A 11/27/2017   Procedure: INSERTION OF MESH;  Surgeon: Berna Bue, MD;  [facility];  Service: General;  Laterality: N/A;   LAPAROSCOPIC LYSIS OF ADHESIONS N/A 11/27/2017   Procedure: LAPAROSCOPIC LYSIS OF ADHESIONS;  Surgeon: Berna Bue, MD;  [facility];  Service: General;  Laterality: N/A;   LIVER SURGERY     partial removal duke in 2003   PORT-A-CATH REMOVAL Right 07/16/2012   Procedure: REMOVAL PORT-A-CATH;  Surgeon: Currie Paris, MD;  Location: [facility];  Service: General;   Laterality: Right;   RIGHT/LEFT HEART CATH AND CORONARY ANGIOGRAPHY N/A 04/01/2021   Procedure: RIGHT/LEFT HEART CATH AND CORONARY ANGIOGRAPHY;  Surgeon: Dolores Patty, MD;  [facility];  Service: Cardiovascular;  Laterality: N/A;   Social History   Occupational History   Not on file  Tobacco Use   Smoking status: Former    Current packs/day: 0.00    Average packs/day: 0.5 packs/day for 32.0 years (16.0 ttl pk-yrs)    Types: Cigarettes    Start date: 05/19/1983    Quit date: 05/19/2015    Years  since quitting: 7.8   Smokeless tobacco: Never  Vaping Use   Vaping status: Never Used  Substance and Sexual Activity   Alcohol use: No   Drug use: No   Sexual activity: Not on file

## 2023-04-05 ENCOUNTER — Other Ambulatory Visit (HOSPITAL_COMMUNITY): Payer: Self-pay | Admitting: Family Medicine

## 2023-04-17 ENCOUNTER — Ambulatory Visit (INDEPENDENT_AMBULATORY_CARE_PROVIDER_SITE_OTHER): Payer: 59 | Admitting: Orthopedic Surgery

## 2023-04-17 DIAGNOSIS — S8012XA Contusion of left lower leg, initial encounter: Secondary | ICD-10-CM

## 2023-04-20 ENCOUNTER — Other Ambulatory Visit (HOSPITAL_COMMUNITY): Payer: Self-pay | Admitting: Internal Medicine

## 2023-04-23 ENCOUNTER — Encounter: Payer: Self-pay | Admitting: Orthopedic Surgery

## 2023-04-23 NOTE — Progress Notes (Signed)
 Office Visit Note   Patient: Raymond Allen           Date of Birth: 1961-02-15           MRN: [id number] Visit Date: 04/17/2023              Requested by: Jordan, Julie M, NP [address],  [place] [address] PCP: Jordan, Julie M, NP  Chief Complaint  Patient presents with   Left Leg - Routine Post Op    01/24/2023 LLE debridement and graft      HPI: Patient is a 63 year old gentleman who is almost 3 months status post debridement left lower extremity wound with tissue graft.  Patient is currently using Vashe and compression.  Assessment & Plan: Visit Diagnoses:  1. Hematoma of left lower leg     Plan: Continue current wound care.  Discussed the importance of elevation compression and exercise to help decrease the edema.  Follow-Up Instructions: Return in about 4 weeks (around 05/15/2023).   Ortho Exam  Patient is alert, oriented, no adenopathy, well-dressed, normal affect, normal respiratory effort. Examination patient still has pitting edema.  The lateral leg ulcer has flat healthy granulation tissue which measures 5 x 6 cm.  Imaging: No results found.   Labs: Lab Results  Component Value Date   HGBA1C 6.1 (H) 01/22/2023   HGBA1C 7.4 (H) 12/30/2021   HGBA1C 7.7 (H) 03/29/2021   CRP 4.8 (H) 01/26/2023   CRP 6.6 (H) 01/25/2023   CRP 3.2 (H) 01/24/2023   LABURIC 8.2 01/23/2023   REPTSTATUS 01/29/2023 FINAL 01/24/2023   GRAMSTAIN NO WBC SEEN NO ORGANISMS SEEN  01/24/2023   CULT  01/24/2023    RARE STAPHYLOCOCCUS SIMULANS RARE BACILLUS SPECIES Standardized susceptibility testing for this organism is not available. NO ANAEROBES ISOLATED Performed at [facility], [address], [place], [place] [address]    [facility] STAPHYLOCOCCUS SIMULANS 01/24/2023     Lab Results  Component Value Date   ALBUMIN 3.0 (L) 01/26/2023   ALBUMIN 3.1 (L) 01/25/2023   ALBUMIN 3.0 (L) 01/24/2023    Lab Results  Component Value Date   MG  2.2 01/26/2023   MG 2.2 01/25/2023   MG 2.0 01/24/2023   No results found for: VD25OH  No results found for: PREALBUMIN    Latest Ref Rng & Units 01/26/2023    3:52 AM 01/25/2023    6:26 AM 01/24/2023    3:23 AM  CBC EXTENDED  WBC 4.0 - 10.5 K/uL 7.3  8.0  6.3   RBC 4.22 - 5.81 MIL/uL 4.86  5.04  4.92   Hemoglobin 13.0 - 17.0 g/dL 87.7  87.3  87.4   HCT 39.0 - 52.0 % 40.4  41.8  41.8   Platelets 150 - 400 K/uL 282  266  234   NEUT# 1.7 - 7.7 K/uL 4.5  6.1  3.5   Lymph# 0.7 - 4.0 K/uL 1.8  1.5  1.7      There is no height or weight on file to calculate BMI.  Orders:  No orders of the defined types were placed in this encounter.  No orders of the defined types were placed in this encounter.    Procedures: No procedures performed  Clinical Data: No additional findings.  ROS:  All other systems negative, except as noted in the HPI. Review of Systems  Objective: Vital Signs: There were no vitals taken for this visit.  Specialty Comments:  No specialty  comments available.  PMFS History: Patient Active Problem List   Diagnosis Date Noted   AKI (acute kidney injury) (HCC) 01/31/2023   Hematoma of left lower leg 01/22/2023   Laceration of left lower extremity 01/22/2023   Wound cellulitis 01/22/2023   Cellulitis 01/21/2023   Chronic combined systolic and diastolic CHF (congestive heart failure) (HCC) 12/30/2021   Atrial fibrillation, chronic (HCC) 12/30/2021   Mixed hyperlipidemia 12/30/2021   Obesity (BMI 30-39.9) 12/30/2021   Pulmonary embolism (HCC) 12/29/2021   Physical deconditioning 10/14/2021   Acute systolic heart failure (HCC) 04/05/2021   Pulmonary edema 04/05/2021   Acute metabolic encephalopathy 04/05/2021   CKD (chronic kidney disease) stage 3, GFR 30-59 ml/min (HCC) 04/05/2021   Atrial arrhythmia 04/05/2021   Respiratory distress 03/29/2021   Heart failure, type unknown (HCC)    Type 2 diabetes mellitus without complication (HCC)     Incisional hernia 11/27/2017   Colon cancer metastasized to liver (HCC) 01/05/2012   FLANK PAIN, RIGHT 01/21/2008   TOBACCO ABUSE 12/26/2007   HYPERGLYCEMIA 12/26/2007   PERS HX NONCOMPLIANCE W/MED TX PRS HAZARDS HLTH 12/26/2007   CARCINOMA, COLON 12/17/2007   Essential hypertension 12/17/2007   Respiratory failure, acute (HCC) 12/17/2007   Past Medical History:  Diagnosis Date   Cancer [facility])    Colon cancer metastasized to liver (HCC) 01/05/2012   Colon carcinoma (HCC)    colon ca dx 2001;   Diabetes mellitus without complication (HCC)    Hypertension    Muscle weakness-general 01/05/2012    History reviewed. No pertinent family history.  Past Surgical History:  Procedure Laterality Date   APPLICATION OF WOUND VAC Left 01/24/2023   Procedure: APPLICATION OF WOUND VAC;  Surgeon: Harden Jerona GAILS, MD;  [facility];  Service: Orthopedics;  Laterality: Left;   CHOLECYSTECTOMY     COLON SURGERY     sigmoid colectomy, appendectomy   I & D EXTREMITY Left 01/24/2023   Procedure: LEFT LEG DEBRIDEMENT;  Surgeon: Harden Jerona GAILS, MD;  Location: [facility] OR;  Service: Orthopedics;  Laterality: Left;   INCISIONAL HERNIA REPAIR N/A 11/27/2017   Procedure: LAPAROSCOPIC ASSISTED INCISIONAL HERNIA;  Surgeon: Signe Mitzie LABOR, MD;  [facility];  Service: General;  Laterality: N/A;   INSERTION OF MESH N/A 11/27/2017   Procedure: INSERTION OF MESH;  Surgeon: Signe Mitzie LABOR, MD;  [facility];  Service: General;  Laterality: N/A;   LAPAROSCOPIC LYSIS OF ADHESIONS N/A 11/27/2017   Procedure: LAPAROSCOPIC LYSIS OF ADHESIONS;  Surgeon: Signe Mitzie LABOR, MD;  [facility]: [facility];  Service: General;  Laterality: N/A;   LIVER SURGERY     partial removal duke in 2003   PORT-A-CATH REMOVAL Right 07/16/2012   Procedure: REMOVAL PORT-A-CATH;  Surgeon: Sherlean JINNY Laughter, MD;  Location: [facility];  Service: General;  Laterality: Right;   RIGHT/LEFT HEART CATH AND CORONARY ANGIOGRAPHY N/A  04/01/2021   Procedure: RIGHT/LEFT HEART CATH AND CORONARY ANGIOGRAPHY;  Surgeon: Cherrie Toribio SAUNDERS, MD;  [facility];  Service: Cardiovascular;  Laterality: N/A;   Social History   Occupational History   Not on file  Tobacco Use   Smoking status: Former    Current packs/day: 0.00    Average packs/day: 0.5 packs/day for 32.0 years (16.0 ttl pk-yrs)    Types: Cigarettes    Start date: 05/19/1983    Quit date: 05/19/2015    Years since quitting: 7.9   Smokeless tobacco: Never  Vaping Use   Vaping status: Never Used  Substance and Sexual Activity   Alcohol  use: No   Drug use: No   Sexual activity: Not on file

## 2023-05-01 ENCOUNTER — Ambulatory Visit: Payer: 59 | Admitting: Orthopedic Surgery

## 2023-05-01 ENCOUNTER — Encounter: Payer: Self-pay | Admitting: Orthopedic Surgery

## 2023-05-01 DIAGNOSIS — S8012XA Contusion of left lower leg, initial encounter: Secondary | ICD-10-CM

## 2023-05-01 DIAGNOSIS — S81812D Laceration without foreign body, left lower leg, subsequent encounter: Secondary | ICD-10-CM | POA: Diagnosis not present

## 2023-05-01 NOTE — Progress Notes (Signed)
 Office Visit Note   Patient: Raymond Allen           Date of Birth: 1960/07/25           MRN: [id number] Visit Date: 05/01/2023              Requested by: Jordan, Julie M, NP [address]  [place] [address] PCP: Jordan, Julie M, NP  Chief Complaint  Patient presents with   Left Leg - Routine Post Op    01/24/2023 LLE debridement and graft      HPI: Patient is a 63 year old gentleman who is status post debridement hematoma left leg and treatment with Kerecis tissue graft.  Patient is currently undergoing Vashe dressing changes daily with compression.  Patient states his pain is a 4 out of 10.  Assessment & Plan: Visit Diagnoses:  1. Hematoma of left lower leg   2. Laceration of left lower extremity, subsequent encounter     Plan: Patient's wound bed has excellent healthy flat granulation tissue.  Continue current wound care.  Follow-Up Instructions: Return in about 3 weeks (around 05/22/2023).   Ortho Exam  Patient is alert, oriented, no adenopathy, well-dressed, normal affect, normal respiratory effort. Examination the wound is flat with healthy granulation tissue.  After informed consent a 10 blade knife was used to debride the skin and soft tissue around the wound edges to healthy viable tissue.  Silver nitrate was used for hemostasis.  This left a wound that was 4.5 x 5 cm.  Imaging: No results found.    Labs: Lab Results  Component Value Date   HGBA1C 6.1 (H) 01/22/2023   HGBA1C 7.4 (H) 12/30/2021   HGBA1C 7.7 (H) 03/29/2021   CRP 4.8 (H) 01/26/2023   CRP 6.6 (H) 01/25/2023   CRP 3.2 (H) 01/24/2023   LABURIC 8.2 01/23/2023   REPTSTATUS 01/29/2023 FINAL 01/24/2023   GRAMSTAIN NO WBC SEEN NO ORGANISMS SEEN  01/24/2023   CULT  01/24/2023    RARE STAPHYLOCOCCUS SIMULANS RARE BACILLUS SPECIES Standardized susceptibility testing for this organism is not available. NO ANAEROBES ISOLATED Performed at [facility], [address], [place], [place] [address]    [facility] STAPHYLOCOCCUS SIMULANS 01/24/2023     Lab Results  Component Value Date   ALBUMIN 3.0 (L) 01/26/2023   ALBUMIN 3.1 (L) 01/25/2023   ALBUMIN 3.0 (L) 01/24/2023    Lab Results  Component Value Date   MG 2.2 01/26/2023   MG 2.2 01/25/2023   MG 2.0 01/24/2023   No results found for: VD25OH  No results found for: PREALBUMIN    Latest Ref Rng & Units 01/26/2023    3:52 AM 01/25/2023    6:26 AM 01/24/2023    3:23 AM  CBC EXTENDED  WBC 4.0 - 10.5 K/uL 7.3  8.0  6.3   RBC 4.22 - 5.81 MIL/uL 4.86  5.04  4.92   Hemoglobin 13.0 - 17.0 g/dL 87.7  87.3  87.4   HCT 39.0 - 52.0 % 40.4  41.8  41.8   Platelets 150 - 400 K/uL 282  266  234   NEUT# 1.7 - 7.7 K/uL 4.5  6.1  3.5   Lymph# 0.7 - 4.0 K/uL 1.8  1.5  1.7      There is no height or weight on file to calculate BMI.  Orders:  No orders of the defined types were placed in this encounter.  No orders of the defined types were placed  in this encounter.    Procedures: No procedures performed  Clinical Data: No additional findings.  ROS:  All other systems negative, except as noted in the HPI. Review of Systems  Objective: Vital Signs: There were no vitals taken for this visit.  Specialty Comments:  No specialty comments available.  PMFS History: Patient Active Problem List   Diagnosis Date Noted   AKI (acute kidney injury) (HCC) 01/31/2023   Hematoma of left lower leg 01/22/2023   Laceration of left lower extremity 01/22/2023   Wound cellulitis 01/22/2023   Cellulitis 01/21/2023   Chronic combined systolic and diastolic CHF (congestive heart failure) (HCC) 12/30/2021   Atrial fibrillation, chronic (HCC) 12/30/2021   Mixed hyperlipidemia 12/30/2021   Obesity (BMI 30-39.9) 12/30/2021   Pulmonary embolism (HCC) 12/29/2021   Physical deconditioning 10/14/2021   Acute systolic heart failure (HCC) 04/05/2021   Pulmonary edema 04/05/2021   Acute metabolic  encephalopathy 04/05/2021   CKD (chronic kidney disease) stage 3, GFR 30-59 ml/min (HCC) 04/05/2021   Atrial arrhythmia 04/05/2021   Respiratory distress 03/29/2021   Heart failure, type unknown (HCC)    Type 2 diabetes mellitus without complication (HCC)    Incisional hernia 11/27/2017   Colon cancer metastasized to liver (HCC) 01/05/2012   FLANK PAIN, RIGHT 01/21/2008   TOBACCO ABUSE 12/26/2007   HYPERGLYCEMIA 12/26/2007   PERS HX NONCOMPLIANCE W/MED TX PRS HAZARDS HLTH 12/26/2007   CARCINOMA, COLON 12/17/2007   Essential hypertension 12/17/2007   Respiratory failure, acute (HCC) 12/17/2007   Past Medical History:  Diagnosis Date   Cancer [facility])    Colon cancer metastasized to liver (HCC) 01/05/2012   Colon carcinoma (HCC)    colon ca dx 2001;   Diabetes mellitus without complication (HCC)    Hypertension    Muscle weakness-general 01/05/2012    History reviewed. No pertinent family history.  Past Surgical History:  Procedure Laterality Date   APPLICATION OF WOUND VAC Left 01/24/2023   Procedure: APPLICATION OF WOUND VAC;  Surgeon: Harden Jerona GAILS, MD;  [facility];  Service: Orthopedics;  Laterality: Left;   CHOLECYSTECTOMY     COLON SURGERY     sigmoid colectomy, appendectomy   I & D EXTREMITY Left 01/24/2023   Procedure: LEFT LEG DEBRIDEMENT;  Surgeon: Harden Jerona GAILS, MD;  Location: [facility] OR;  Service: Orthopedics;  Laterality: Left;   INCISIONAL HERNIA REPAIR N/A 11/27/2017   Procedure: LAPAROSCOPIC ASSISTED INCISIONAL HERNIA;  Surgeon: Signe Mitzie LABOR, MD;  [facility]: [facility]S;  Service: General;  Laterality: N/A;   INSERTION OF MESH N/A 11/27/2017   Procedure: INSERTION OF MESH;  Surgeon: Signe Mitzie LABOR, MD;  [facility];  Service: General;  Laterality: N/A;   LAPAROSCOPIC LYSIS OF ADHESIONS N/A 11/27/2017   Procedure: LAPAROSCOPIC LYSIS OF ADHESIONS;  Surgeon: Signe Mitzie LABOR, MD;  [facility]: [facility];  Service: General;  Laterality: N/A;   LIVER SURGERY     partial  removal [facility] in 2003   PORT-A-CATH REMOVAL Right 07/16/2012   Procedure: REMOVAL PORT-A-CATH;  Surgeon: Sherlean JINNY Laughter, MD;  Location: [facility];  Service: General;  Laterality: Right;   RIGHT/LEFT HEART CATH AND CORONARY ANGIOGRAPHY N/A 04/01/2021   Procedure: RIGHT/LEFT HEART CATH AND CORONARY ANGIOGRAPHY;  Surgeon: Cherrie Toribio SAUNDERS, MD;  [facility];  Service: Cardiovascular;  Laterality: N/A;   Social History   Occupational History   Not on file  Tobacco Use   Smoking status: Former    Current packs/day: 0.00  Average packs/day: 0.5 packs/day for 32.0 years (16.0 ttl pk-yrs)    Types: Cigarettes    Start date: 05/19/1983    Quit date: 05/19/2015    Years since quitting: 7.9   Smokeless tobacco: Never  Vaping Use   Vaping status: Never Used  Substance and Sexual Activity   Alcohol  use: No   Drug use: No   Sexual activity: Not on file

## 2023-05-20 ENCOUNTER — Other Ambulatory Visit (HOSPITAL_COMMUNITY): Payer: Self-pay | Admitting: Internal Medicine

## 2023-05-22 ENCOUNTER — Telehealth: Payer: Self-pay | Admitting: Orthopedic Surgery

## 2023-05-22 ENCOUNTER — Ambulatory Visit (INDEPENDENT_AMBULATORY_CARE_PROVIDER_SITE_OTHER): Payer: 59 | Admitting: Orthopedic Surgery

## 2023-05-22 ENCOUNTER — Other Ambulatory Visit: Payer: Self-pay | Admitting: Thoracic Surgery (Cardiothoracic Vascular Surgery)

## 2023-05-22 DIAGNOSIS — I7121 Aneurysm of the ascending aorta, without rupture: Secondary | ICD-10-CM

## 2023-05-22 DIAGNOSIS — S8012XA Contusion of left lower leg, initial encounter: Secondary | ICD-10-CM

## 2023-05-22 NOTE — Telephone Encounter (Signed)
Pt wife, Tawanna Solo is requesting a phone call in regards to pt.

## 2023-05-23 ENCOUNTER — Encounter: Payer: Self-pay | Admitting: Orthopedic Surgery

## 2023-05-23 ENCOUNTER — Telehealth: Payer: Self-pay | Admitting: Orthopedic Surgery

## 2023-05-23 NOTE — Telephone Encounter (Signed)
 Patient called requesting a call back. Johathon Overturf [phone]

## 2023-05-23 NOTE — Telephone Encounter (Signed)
 LMTCB

## 2023-05-23 NOTE — Progress Notes (Signed)
 Office Visit Note   Patient: Raymond Allen           Date of Birth: [birth year]           MRN: [id number] Visit Date: 05/22/2023              Requested by: Jordan, Julie M, NP No address on file PCP: Jordan, Julie M, NP  Chief Complaint  Patient presents with   Left Leg - Wound Check    01/24/2023 LLE debridement and graft      HPI: Patient is a 63 year old gentleman is seen in follow-up for debridement and grafting to the left lower extremity.  Patient is currently undergoing Vashe dressing changes daily with compression.  Patient is 4 months out from surgery.  Of note patient is getting epidural steroid injections for his back for chronic back pain in [place].  Assessment & Plan: Visit Diagnoses:  1. Hematoma of left lower leg     Plan: Continue with the Vashe dressing changes with compression.  Follow-Up Instructions: Return in about 4 weeks (around 06/19/2023).   Ortho Exam  Patient is alert, oriented, no adenopathy, well-dressed, normal affect, normal respiratory effort. Examination the wound bed has healthy granulation tissue there is some mild maceration inferiorly.  There is good wrinkling of the skin however there is swelling.  The wound is 3 x 3 cm with flat healthy granulation tissue.  Patient has a strong palpable dorsalis pedis pulse.  Imaging: No results found.   Labs: Lab Results  Component Value Date   HGBA1C 6.1 (H) 01/22/2023   HGBA1C 7.4 (H) 12/30/2021   HGBA1C 7.7 (H) 03/29/2021   CRP 4.8 (H) 01/26/2023   CRP 6.6 (H) 01/25/2023   CRP 3.2 (H) 01/24/2023   LABURIC 8.2 01/23/2023   REPTSTATUS 01/29/2023 FINAL 01/24/2023   GRAMSTAIN NO WBC SEEN NO ORGANISMS SEEN  01/24/2023   CULT  01/24/2023    RARE STAPHYLOCOCCUS SIMULANS RARE BACILLUS SPECIES Standardized susceptibility testing for this organism is not available. NO ANAEROBES ISOLATED Performed at [facility], [address] [place], [place] [address]    [facility]  STAPHYLOCOCCUS SIMULANS 01/24/2023     Lab Results  Component Value Date   ALBUMIN 3.0 (L) 01/26/2023   ALBUMIN 3.1 (L) 01/25/2023   ALBUMIN 3.0 (L) 01/24/2023    Lab Results  Component Value Date   MG 2.2 01/26/2023   MG 2.2 01/25/2023   MG 2.0 01/24/2023   No results found for: VD25OH  No results found for: PREALBUMIN    Latest Ref Rng & Units 01/26/2023    3:52 AM 01/25/2023    6:26 AM 01/24/2023    3:23 AM  CBC EXTENDED  WBC 4.0 - 10.5 K/uL 7.3  8.0  6.3   RBC 4.22 - 5.81 MIL/uL 4.86  5.04  4.92   Hemoglobin 13.0 - 17.0 g/dL 87.7  87.3  87.4   HCT 39.0 - 52.0 % 40.4  41.8  41.8   Platelets 150 - 400 K/uL 282  266  234   NEUT# 1.7 - 7.7 K/uL 4.5  6.1  3.5   Lymph# 0.7 - 4.0 K/uL 1.8  1.5  1.7      There is no height or weight on file to calculate BMI.  Orders:  No orders of the defined types were placed in this encounter.  No orders of the defined types were placed in this encounter.    Procedures: No procedures performed  Clinical Data: No  additional findings.  ROS:  All other systems negative, except as noted in the HPI. Review of Systems  Objective: Vital Signs: There were no vitals taken for this visit.  Specialty Comments:  No specialty comments available.  PMFS History: Patient Active Problem List   Diagnosis Date Noted   AKI (acute kidney injury) (HCC) 01/31/2023   Hematoma of left lower leg 01/22/2023   Laceration of left lower extremity 01/22/2023   Wound cellulitis 01/22/2023   Cellulitis 01/21/2023   Chronic combined systolic and diastolic CHF (congestive heart failure) (HCC) 12/30/2021   Atrial fibrillation, chronic (HCC) 12/30/2021   Mixed hyperlipidemia 12/30/2021   Obesity (BMI 30-39.9) 12/30/2021   Pulmonary embolism (HCC) 12/29/2021   Physical deconditioning 10/14/2021   Acute systolic heart failure (HCC) 04/05/2021   Pulmonary edema 04/05/2021   Acute metabolic encephalopathy 04/05/2021   CKD (chronic kidney  disease) stage 3, GFR 30-59 ml/min (HCC) 04/05/2021   Atrial arrhythmia 04/05/2021   Respiratory distress 03/29/2021   Heart failure, type unknown (HCC)    Type 2 diabetes mellitus without complication (HCC)    Incisional hernia 11/27/2017   Colon cancer metastasized to liver (HCC) 01/05/2012   FLANK PAIN, RIGHT 01/21/2008   TOBACCO ABUSE 12/26/2007   HYPERGLYCEMIA 12/26/2007   PERS HX NONCOMPLIANCE W/MED TX PRS HAZARDS HLTH 12/26/2007   CARCINOMA, COLON 12/17/2007   Essential hypertension 12/17/2007   Respiratory failure, acute (HCC) 12/17/2007   Past Medical History:  Diagnosis Date   Cancer [facility])    Colon cancer metastasized to liver (HCC) 01/05/2012   Colon carcinoma (HCC)    colon ca dx 2001;   Diabetes mellitus without complication (HCC)    Hypertension    Muscle weakness-general 01/05/2012    History reviewed. No pertinent family history.  Past Surgical History:  Procedure Laterality Date   APPLICATION OF WOUND VAC Left 01/24/2023   Procedure: APPLICATION OF WOUND VAC;  Surgeon: Harden Jerona GAILS, MD;  [facility] OR;  Service: Orthopedics;  Laterality: Left;   CHOLECYSTECTOMY     COLON SURGERY     sigmoid colectomy, appendectomy   I & D EXTREMITY Left 01/24/2023   Procedure: LEFT LEG DEBRIDEMENT;  Surgeon: Harden Jerona GAILS, MD;  Location: [facility] OR;  Service: Orthopedics;  Laterality: Left;   INCISIONAL HERNIA REPAIR N/A 11/27/2017   Procedure: LAPAROSCOPIC ASSISTED INCISIONAL HERNIA;  Surgeon: Signe Mitzie LABOR, MD;  [facility];  Service: General;  Laterality: N/A;   INSERTION OF MESH N/A 11/27/2017   Procedure: INSERTION OF MESH;  Surgeon: Signe Mitzie LABOR, MD;  [facility];  Service: General;  Laterality: N/A;   LAPAROSCOPIC LYSIS OF ADHESIONS N/A 11/27/2017   Procedure: LAPAROSCOPIC LYSIS OF ADHESIONS;  Surgeon: Signe Mitzie LABOR, MD;  [facility];  Service: General;  Laterality: N/A;   LIVER SURGERY     partial removal [facility] in 2003   PORT-A-CATH REMOVAL Right  07/16/2012   Procedure: REMOVAL PORT-A-CATH;  Surgeon: Sherlean JINNY Laughter, MD;  Location:  SURGERY CENTER;  Service: General;  Laterality: Right;   RIGHT/LEFT HEART CATH AND CORONARY ANGIOGRAPHY N/A 04/01/2021   Procedure: RIGHT/LEFT HEART CATH AND CORONARY ANGIOGRAPHY;  Surgeon: Cherrie Toribio SAUNDERS, MD;  [facility];  Service: Cardiovascular;  Laterality: N/A;   Social History   Occupational History   Not on file  Tobacco Use   Smoking status: Former    Current packs/day: 0.00    Average packs/day: 0.5 packs/day for 32.0 years (16.0 ttl pk-yrs)  Types: Cigarettes    Start date: 05/19/1983    Quit date: 05/19/2015    Years since quitting: 8.0   Smokeless tobacco: Never  Vaping Use   Vaping status: Never Used  Substance and Sexual Activity   Alcohol  use: No   Drug use: No   Sexual activity: Not on file

## 2023-05-24 NOTE — Telephone Encounter (Signed)
 SW pt's wife. She says that she will be out of town the first week of march. She changes pt's bandage every day. He does not want family or a neighbor coming into the house and doing this for him. I offered HHRN and she said he will not allow it. I let her know that I am not sure how to help them with this. She said she will try to teach him how to do this himself. I stated that would be great if he would do that for himself for the week at least every other day. We do not want a bandage sitting on his wound for a week un touched. She will call with any other concerns.

## 2023-05-24 NOTE — Telephone Encounter (Signed)
 SW pt's wife, see other TE message.

## 2023-06-12 ENCOUNTER — Telehealth: Payer: Self-pay

## 2023-06-12 NOTE — Telephone Encounter (Signed)
 Patients friend called concerning fish skin dressing.  Stated that some of the fish skin came off while cleaning the wound and wanted to know if this would affect the wound.  CB# [phone].  Please advise.  Thank you

## 2023-06-13 NOTE — Telephone Encounter (Signed)
 I called and pt is not able to come today made an appt for tomorrow at 2:30 with Dr. Lajoyce Corners for eval.

## 2023-06-14 ENCOUNTER — Ambulatory Visit (INDEPENDENT_AMBULATORY_CARE_PROVIDER_SITE_OTHER): Payer: 59 | Admitting: Orthopedic Surgery

## 2023-06-14 DIAGNOSIS — S8012XA Contusion of left lower leg, initial encounter: Secondary | ICD-10-CM

## 2023-06-14 DIAGNOSIS — S81812D Laceration without foreign body, left lower leg, subsequent encounter: Secondary | ICD-10-CM | POA: Diagnosis not present

## 2023-06-20 ENCOUNTER — Other Ambulatory Visit (HOSPITAL_COMMUNITY): Payer: Self-pay | Admitting: Internal Medicine

## 2023-06-21 ENCOUNTER — Encounter: Payer: Self-pay | Admitting: Orthopedic Surgery

## 2023-06-21 NOTE — Progress Notes (Signed)
 Office Visit Note   Patient: Raymond Allen           Date of Birth: March 11, 1961           MRN: [id number] Visit Date: 06/14/2023              Requested by: [place], Julie M, NP No address on file PCP: [place], Julie M, NP  Chief Complaint  Patient presents with   Left Leg - Follow-up    01/24/2023 LLE debridement and graft         HPI: Patient is over 4 months post left leg debridement.  Patient currently undergoing dressing changes with Vashe and dry dressing changes.  Patient states he has increased swelling.  Assessment & Plan: Visit Diagnoses:  1. Hematoma of left lower leg   2. Laceration of left lower extremity, subsequent encounter     Plan: Patient will continue with routine wound care.  He will follow-up with a general surgeon regarding his hernia that he states he has had for 8 years.  Follow-Up Instructions: Return in about 2 weeks (around 06/28/2023).   Ortho Exam  Patient is alert, oriented, no adenopathy, well-dressed, normal affect, normal respiratory effort. Examination patient ambulates with a cane.  Wound bed shows flat healthy granulation tissue.  There is epithelization around the wound edges.  Wound currently measures 3 x 3 cm.  Imaging: No results found.   Labs: Lab Results  Component Value Date   HGBA1C 6.1 (H) 01/22/2023   HGBA1C 7.4 (H) 12/30/2021   HGBA1C 7.7 (H) 03/29/2021   CRP 4.8 (H) 01/26/2023   CRP 6.6 (H) 01/25/2023   CRP 3.2 (H) 01/24/2023   LABURIC 8.2 01/23/2023   REPTSTATUS 01/29/2023 FINAL 01/24/2023   GRAMSTAIN NO WBC SEEN NO ORGANISMS SEEN  01/24/2023   CULT  01/24/2023    RARE STAPHYLOCOCCUS SIMULANS RARE BACILLUS SPECIES Standardized susceptibility testing for this organism is not available. NO ANAEROBES ISOLATED Performed at [facility], [address], [place], [place] [address]    [facility] STAPHYLOCOCCUS SIMULANS 01/24/2023     Lab Results  Component Value Date   ALBUMIN 3.0 (L) 01/26/2023    ALBUMIN 3.1 (L) 01/25/2023   ALBUMIN 3.0 (L) 01/24/2023    Lab Results  Component Value Date   MG 2.2 01/26/2023   MG 2.2 01/25/2023   MG 2.0 01/24/2023   No results found for: "VD25OH"  No results found for: "PREALBUMIN"    Latest Ref Rng & Units 01/26/2023    3:52 AM 01/25/2023    6:26 AM 01/24/2023    3:23 AM  CBC EXTENDED  WBC 4.0 - 10.5 K/uL 7.3  8.0  6.3   RBC 4.22 - 5.81 MIL/uL 4.86  5.04  4.92   Hemoglobin 13.0 - 17.0 g/dL 19.1  47.8  29.5   HCT 39.0 - 52.0 % 40.4  41.8  41.8   Platelets 150 - 400 K/uL 282  266  234   NEUT# 1.7 - 7.7 K/uL 4.5  6.1  3.5   Lymph# 0.7 - 4.0 K/uL 1.8  1.5  1.7      There is no height or weight on file to calculate BMI.  Orders:  No orders of the defined types were placed in this encounter.  No orders of the defined types were placed in this encounter.    Procedures: No procedures performed  Clinical Data: No additional findings.  ROS:  All other systems negative, except as noted in the  HPI. Review of Systems  Objective: Vital Signs: There were no vitals taken for this visit.  Specialty Comments:  No specialty comments available.  PMFS History: Patient Active Problem List   Diagnosis Date Noted   AKI (acute kidney injury) (HCC) 01/31/2023   Hematoma of left lower leg 01/22/2023   Laceration of left lower extremity 01/22/2023   Wound cellulitis 01/22/2023   Cellulitis 01/21/2023   Chronic combined systolic and diastolic CHF (congestive heart failure) (HCC) 12/30/2021   Atrial fibrillation, chronic (HCC) 12/30/2021   Mixed hyperlipidemia 12/30/2021   Obesity (BMI 30-39.9) 12/30/2021   Pulmonary embolism (HCC) 12/29/2021   Physical deconditioning 10/14/2021   Acute systolic heart failure (HCC) 04/05/2021   Pulmonary edema 04/05/2021   Acute metabolic encephalopathy 04/05/2021   CKD (chronic kidney disease) stage 3, GFR 30-59 ml/min (HCC) 04/05/2021   Atrial arrhythmia 04/05/2021   Respiratory distress 03/29/2021    Heart failure, type unknown (HCC)    Type 2 diabetes mellitus without complication (HCC)    Incisional hernia 11/27/2017   Colon cancer metastasized to liver (HCC) 01/05/2012   FLANK PAIN, RIGHT 01/21/2008   TOBACCO ABUSE 12/26/2007   HYPERGLYCEMIA 12/26/2007   PERS HX NONCOMPLIANCE W/MED TX PRS HAZARDS HLTH 12/26/2007   CARCINOMA, COLON 12/17/2007   Essential hypertension 12/17/2007   Respiratory failure, acute (HCC) 12/17/2007   Past Medical History:  Diagnosis Date   Cancer [facility])    Colon cancer metastasized to liver (HCC) 01/05/2012   Colon carcinoma (HCC)    colon ca dx 2001;   Diabetes mellitus without complication (HCC)    Hypertension    Muscle weakness-general 01/05/2012    History reviewed. No pertinent family history.  Past Surgical History:  Procedure Laterality Date   APPLICATION OF WOUND VAC Left 01/24/2023   Procedure: APPLICATION OF WOUND VAC;  Surgeon: [name], MD;  [facility];  Service: Orthopedics;  Laterality: Left;   CHOLECYSTECTOMY     COLON SURGERY     sigmoid colectomy, appendectomy   I & D EXTREMITY Left 01/24/2023   Procedure: LEFT LEG DEBRIDEMENT;  Surgeon: [name], MD;  Location: [facility] OR;  Service: Orthopedics;  Laterality: Left;   INCISIONAL HERNIA REPAIR N/A 11/27/2017   Procedure: LAPAROSCOPIC ASSISTED INCISIONAL HERNIA;  Surgeon: Berna Bue, MD;  [facility];  Service: General;  Laterality: N/A;   INSERTION OF MESH N/A 11/27/2017   Procedure: INSERTION OF MESH;  Surgeon: Berna Bue, MD;  [facility];  Service: General;  Laterality: N/A;   LAPAROSCOPIC LYSIS OF ADHESIONS N/A 11/27/2017   Procedure: LAPAROSCOPIC LYSIS OF ADHESIONS;  Surgeon: Berna Bue, MD;  [facility];  Service: General;  Laterality: N/A;   LIVER SURGERY     partial removal duke in 2003   PORT-A-CATH REMOVAL Right 07/16/2012   Procedure: REMOVAL PORT-A-CATH;  Surgeon: Currie Paris, MD;  Location: [facility];   Service: General;  Laterality: Right;   RIGHT/LEFT HEART CATH AND CORONARY ANGIOGRAPHY N/A 04/01/2021   Procedure: RIGHT/LEFT HEART CATH AND CORONARY ANGIOGRAPHY;  Surgeon: Dolores Patty, MD;  [facility];  Service: Cardiovascular;  Laterality: N/A;   Social History   Occupational History   Not on file  Tobacco Use   Smoking status: Former    Current packs/day: 0.00    Average packs/day: 0.5 packs/day for 32.0 years (16.0 ttl pk-yrs)    Types: Cigarettes    Start date: 05/19/1983    Quit date:  05/19/2015    Years since quitting: 8.0   Smokeless tobacco: Never  Vaping Use   Vaping status: Never Used  Substance and Sexual Activity   Alcohol use: No   Drug use: No   Sexual activity: Not on file

## 2023-06-24 ENCOUNTER — Other Ambulatory Visit (HOSPITAL_COMMUNITY): Payer: Self-pay | Admitting: Internal Medicine

## 2023-06-25 ENCOUNTER — Encounter: Payer: Self-pay | Admitting: Orthopedic Surgery

## 2023-06-25 ENCOUNTER — Ambulatory Visit (INDEPENDENT_AMBULATORY_CARE_PROVIDER_SITE_OTHER): Admitting: Orthopedic Surgery

## 2023-06-25 DIAGNOSIS — S81812D Laceration without foreign body, left lower leg, subsequent encounter: Secondary | ICD-10-CM | POA: Diagnosis not present

## 2023-06-25 DIAGNOSIS — S8012XA Contusion of left lower leg, initial encounter: Secondary | ICD-10-CM | POA: Diagnosis not present

## 2023-06-25 NOTE — Progress Notes (Signed)
 Office Visit Note   Patient: Raymond Allen           Date of Birth: 07-10-1960           MRN: [id number] Visit Date: 06/25/2023              Requested by: [place], Julie M, NP No address on file PCP: [place], Julie M, NP  Chief Complaint  Patient presents with   Left Leg - Routine Post Op     01/24/2023 LLE debridement and graft      HPI: Patient is a 63 year old gentleman who presents in follow-up for debridement ulceration and tissue grafting left lower extremity.  Initial surgery October 9.  Patient is 5 months out from surgery.  Assessment & Plan: Visit Diagnoses:  1. Hematoma of left lower leg   2. Laceration of left lower extremity, subsequent encounter     Plan: Continue with current Vashe dressing changes he does have venous swelling discussed the importance of elevation, compression and exercise.  Follow-Up Instructions: Return in about 2 weeks (around 07/09/2023).   Ortho Exam  Patient is alert, oriented, no adenopathy, well-dressed, normal affect, normal respiratory effort. Examination there is venous stasis swelling.  The ulcer shows excellent granulation tissue.  The ulcer is flat measures 2 x 3 cm.  Imaging: No results found.   Labs: Lab Results  Component Value Date   HGBA1C 6.1 (H) 01/22/2023   HGBA1C 7.4 (H) 12/30/2021   HGBA1C 7.7 (H) 03/29/2021   CRP 4.8 (H) 01/26/2023   CRP 6.6 (H) 01/25/2023   CRP 3.2 (H) 01/24/2023   LABURIC 8.2 01/23/2023   REPTSTATUS 01/29/2023 FINAL 01/24/2023   GRAMSTAIN NO WBC SEEN NO ORGANISMS SEEN  01/24/2023   CULT  01/24/2023    RARE STAPHYLOCOCCUS SIMULANS RARE BACILLUS SPECIES Standardized susceptibility testing for this organism is not available. NO ANAEROBES ISOLATED Performed at [facility], [address], [place], [place] [address]    [facility] STAPHYLOCOCCUS SIMULANS 01/24/2023     Lab Results  Component Value Date   ALBUMIN 3.0 (L) 01/26/2023   ALBUMIN 3.1 (L) 01/25/2023   ALBUMIN 3.0  (L) 01/24/2023    Lab Results  Component Value Date   MG 2.2 01/26/2023   MG 2.2 01/25/2023   MG 2.0 01/24/2023   No results found for: "VD25OH"  No results found for: "PREALBUMIN"    Latest Ref Rng & Units 01/26/2023    3:52 AM 01/25/2023    6:26 AM 01/24/2023    3:23 AM  CBC EXTENDED  WBC 4.0 - 10.5 K/uL 7.3  8.0  6.3   RBC 4.22 - 5.81 MIL/uL 4.86  5.04  4.92   Hemoglobin 13.0 - 17.0 g/dL 32.4  40.1  02.7   HCT 39.0 - 52.0 % 40.4  41.8  41.8   Platelets 150 - 400 K/uL 282  266  234   NEUT# 1.7 - 7.7 K/uL 4.5  6.1  3.5   Lymph# 0.7 - 4.0 K/uL 1.8  1.5  1.7      There is no height or weight on file to calculate BMI.  Orders:  No orders of the defined types were placed in this encounter.  No orders of the defined types were placed in this encounter.    Procedures: No procedures performed  Clinical Data: No additional findings.  ROS:  All other systems negative, except as noted in the HPI. Review of Systems  Objective: Vital Signs: There were no vitals  taken for this visit.  Specialty Comments:  No specialty comments available.  PMFS History: Patient Active Problem List   Diagnosis Date Noted   AKI (acute kidney injury) (HCC) 01/31/2023   Hematoma of left lower leg 01/22/2023   Laceration of left lower extremity 01/22/2023   Wound cellulitis 01/22/2023   Cellulitis 01/21/2023   Chronic combined systolic and diastolic CHF (congestive heart failure) (HCC) 12/30/2021   Atrial fibrillation, chronic (HCC) 12/30/2021   Mixed hyperlipidemia 12/30/2021   Obesity (BMI 30-39.9) 12/30/2021   Pulmonary embolism (HCC) 12/29/2021   Physical deconditioning 10/14/2021   Acute systolic heart failure (HCC) 04/05/2021   Pulmonary edema 04/05/2021   Acute metabolic encephalopathy 04/05/2021   CKD (chronic kidney disease) stage 3, GFR 30-59 ml/min (HCC) 04/05/2021   Atrial arrhythmia 04/05/2021   Respiratory distress 03/29/2021   Heart failure, type unknown (HCC)     Type 2 diabetes mellitus without complication (HCC)    Incisional hernia 11/27/2017   Colon cancer metastasized to liver (HCC) 01/05/2012   FLANK PAIN, RIGHT 01/21/2008   TOBACCO ABUSE 12/26/2007   HYPERGLYCEMIA 12/26/2007   PERS HX NONCOMPLIANCE W/MED TX PRS HAZARDS HLTH 12/26/2007   CARCINOMA, COLON 12/17/2007   Essential hypertension 12/17/2007   Respiratory failure, acute (HCC) 12/17/2007   Past Medical History:  Diagnosis Date   Cancer [facility])    Colon cancer metastasized to liver (HCC) 01/05/2012   Colon carcinoma (HCC)    colon ca dx 2001;   Diabetes mellitus without complication (HCC)    Hypertension    Muscle weakness-general 01/05/2012    History reviewed. No pertinent family history.  Past Surgical History:  Procedure Laterality Date   APPLICATION OF WOUND VAC Left 01/24/2023   Procedure: APPLICATION OF WOUND VAC;  Surgeon: [name], MD;  Location: [facility] OR;  Service: Orthopedics;  Laterality: Left;   CHOLECYSTECTOMY     COLON SURGERY     sigmoid colectomy, appendectomy   I & D EXTREMITY Left 01/24/2023   Procedure: LEFT LEG DEBRIDEMENT;  Surgeon: [name], MD;  Location: [facility] OR;  Service: Orthopedics;  Laterality: Left;   INCISIONAL HERNIA REPAIR N/A 11/27/2017   Procedure: LAPAROSCOPIC ASSISTED INCISIONAL HERNIA;  Surgeon: Berna Bue, MD;  Location: [facility] ORS;  Service: General;  Laterality: N/A;   INSERTION OF MESH N/A 11/27/2017   Procedure: INSERTION OF MESH;  Surgeon: Berna Bue, MD;  Location: [facility]S;  Service: General;  Laterality: N/A;   LAPAROSCOPIC LYSIS OF ADHESIONS N/A 11/27/2017   Procedure: LAPAROSCOPIC LYSIS OF ADHESIONS;  Surgeon: Berna Bue, MD;  Location: [facility];  Service: General;  Laterality: N/A;   LIVER SURGERY     partial removal duke in 2003   PORT-A-CATH REMOVAL Right 07/16/2012   Procedure: REMOVAL PORT-A-CATH;  Surgeon: Currie Paris, MD;  Location: [facility];  Service: General;  Laterality: Right;    RIGHT/LEFT HEART CATH AND CORONARY ANGIOGRAPHY N/A 04/01/2021   Procedure: RIGHT/LEFT HEART CATH AND CORONARY ANGIOGRAPHY;  Surgeon: Dolores Patty, MD;  [facility];  Service: Cardiovascular;  Laterality: N/A;   Social History   Occupational History   Not on file  Tobacco Use   Smoking status: Former    Current packs/day: 0.00    Average packs/day: 0.5 packs/day for 32.0 years (16.0 ttl pk-yrs)    Types: Cigarettes    Start date: 05/19/1983    Quit date: 05/19/2015    Years since quitting: 8.1   Smokeless tobacco:  Never  Vaping Use   Vaping status: Never Used  Substance and Sexual Activity   Alcohol use: No   Drug use: No   Sexual activity: Not on file

## 2023-06-26 ENCOUNTER — Ambulatory Visit: Payer: 59 | Admitting: Orthopedic Surgery

## 2023-07-01 ENCOUNTER — Other Ambulatory Visit (HOSPITAL_COMMUNITY): Payer: Self-pay | Admitting: Family Medicine

## 2023-07-09 ENCOUNTER — Ambulatory Visit (INDEPENDENT_AMBULATORY_CARE_PROVIDER_SITE_OTHER): Admitting: Orthopedic Surgery

## 2023-07-09 DIAGNOSIS — S81812D Laceration without foreign body, left lower leg, subsequent encounter: Secondary | ICD-10-CM

## 2023-07-09 DIAGNOSIS — S8012XA Contusion of left lower leg, initial encounter: Secondary | ICD-10-CM | POA: Diagnosis not present

## 2023-07-12 ENCOUNTER — Ambulatory Visit
Admission: RE | Admit: 2023-07-12 | Discharge: 2023-07-12 | Disposition: A | Discharge status: Home / Self Care | Payer: 59 | Source: Ambulatory Visit | Attending: Thoracic Surgery (Cardiothoracic Vascular Surgery) | Admitting: Thoracic Surgery (Cardiothoracic Vascular Surgery)

## 2023-07-12 ENCOUNTER — Ambulatory Visit: Payer: 59

## 2023-07-12 DIAGNOSIS — I7121 Aneurysm of the ascending aorta, without rupture: Secondary | ICD-10-CM

## 2023-07-12 MED ORDER — IOPAMIDOL (ISOVUE-370) INJECTION 76%
100.0000 mL | Freq: Once | INTRAVENOUS | Status: AC | PRN
  Administered 2023-07-12: 100 mL via INTRAVENOUS

## 2023-07-15 ENCOUNTER — Encounter: Payer: Self-pay | Admitting: Orthopedic Surgery

## 2023-07-15 NOTE — Progress Notes (Signed)
 Office Visit Note   Patient: Raymond Allen           Date of Birth: October 02, 1960           MRN: [id number] Visit Date: 07/09/2023              Requested by: [place], Julie M, NP No address on file PCP: [place], Julie M, NP  Chief Complaint  Patient presents with   Left Leg - Follow-up    01/24/2023 LLE debridement and graft      HPI: Patient is a 63 year old gentleman who is 5 months status post debridement hematoma left leg.  Patient is currently using Vashe dressing changes.  Assessment & Plan: Visit Diagnoses:  1. Hematoma of left lower leg   2. Laceration of left lower extremity, subsequent encounter     Plan: Continue with current Vashe and 4 x 4 dressing changes with an Ace wrap.  Follow-Up Instructions: Return in about 2 weeks (around 07/23/2023).   Ortho Exam  Patient is alert, oriented, no adenopathy, well-dressed, normal affect, normal respiratory effort. Examination patient continues to heal well.  The ulcer is 2 x 2 cm with flat healthy granulation tissue.  Imaging: No results found.    Labs: Lab Results  Component Value Date   HGBA1C 6.1 (H) 01/22/2023   HGBA1C 7.4 (H) 12/30/2021   HGBA1C 7.7 (H) 03/29/2021   CRP 4.8 (H) 01/26/2023   CRP 6.6 (H) 01/25/2023   CRP 3.2 (H) 01/24/2023   LABURIC 8.2 01/23/2023   REPTSTATUS 01/29/2023 FINAL 01/24/2023   GRAMSTAIN NO WBC SEEN NO ORGANISMS SEEN  01/24/2023   CULT  01/24/2023    RARE STAPHYLOCOCCUS SIMULANS RARE BACILLUS SPECIES Standardized susceptibility testing for this organism is not available. NO ANAEROBES ISOLATED Performed at [facility], [address], [place], [place] [address]    [facility] STAPHYLOCOCCUS SIMULANS 01/24/2023     Lab Results  Component Value Date   ALBUMIN 3.0 (L) 01/26/2023   ALBUMIN 3.1 (L) 01/25/2023   ALBUMIN 3.0 (L) 01/24/2023    Lab Results  Component Value Date   MG 2.2 01/26/2023   MG 2.2 01/25/2023   MG 2.0 01/24/2023   No results found for:  "VD25OH"  No results found for: "PREALBUMIN"    Latest Ref Rng & Units 01/26/2023    3:52 AM 01/25/2023    6:26 AM 01/24/2023    3:23 AM  CBC EXTENDED  WBC 4.0 - 10.5 K/uL 7.3  8.0  6.3   RBC 4.22 - 5.81 MIL/uL 4.86  5.04  4.92   Hemoglobin 13.0 - 17.0 g/dL 19.1  47.8  29.5   HCT 39.0 - 52.0 % 40.4  41.8  41.8   Platelets 150 - 400 K/uL 282  266  234   NEUT# 1.7 - 7.7 K/uL 4.5  6.1  3.5   Lymph# 0.7 - 4.0 K/uL 1.8  1.5  1.7      There is no height or weight on file to calculate BMI.  Orders:  No orders of the defined types were placed in this encounter.  No orders of the defined types were placed in this encounter.    Procedures: No procedures performed  Clinical Data: No additional findings.  ROS:  All other systems negative, except as noted in the HPI. Review of Systems  Objective: Vital Signs: There were no vitals taken for this visit.  Specialty Comments:  No specialty comments available.  PMFS History: Patient Active Problem List  Diagnosis Date Noted   AKI (acute kidney injury) (HCC) 01/31/2023   Hematoma of left lower leg 01/22/2023   Laceration of left lower extremity 01/22/2023   Wound cellulitis 01/22/2023   Cellulitis 01/21/2023   Chronic combined systolic and diastolic CHF (congestive heart failure) (HCC) 12/30/2021   Atrial fibrillation, chronic (HCC) 12/30/2021   Mixed hyperlipidemia 12/30/2021   Obesity (BMI 30-39.9) 12/30/2021   Pulmonary embolism (HCC) 12/29/2021   Physical deconditioning 10/14/2021   Acute systolic heart failure (HCC) 04/05/2021   Pulmonary edema 04/05/2021   Acute metabolic encephalopathy 04/05/2021   CKD (chronic kidney disease) stage 3, GFR 30-59 ml/min (HCC) 04/05/2021   Atrial arrhythmia 04/05/2021   Respiratory distress 03/29/2021   Heart failure, type unknown (HCC)    Type 2 diabetes mellitus without complication (HCC)    Incisional hernia 11/27/2017   Colon cancer metastasized to liver (HCC) 01/05/2012    FLANK PAIN, RIGHT 01/21/2008   TOBACCO ABUSE 12/26/2007   HYPERGLYCEMIA 12/26/2007   PERS HX NONCOMPLIANCE W/MED TX PRS HAZARDS HLTH 12/26/2007   CARCINOMA, COLON 12/17/2007   Essential hypertension 12/17/2007   Respiratory failure, acute (HCC) 12/17/2007   Past Medical History:  Diagnosis Date   Cancer [facility])    Colon cancer metastasized to liver (HCC) 01/05/2012   Colon carcinoma (HCC)    colon ca dx 2001;   Diabetes mellitus without complication (HCC)    Hypertension    Muscle weakness-general 01/05/2012    History reviewed. No pertinent family history.  Past Surgical History:  Procedure Laterality Date   APPLICATION OF WOUND VAC Left 01/24/2023   Procedure: APPLICATION OF WOUND VAC;  Surgeon: [name], MD;  [facility]: [facility] OR;  Service: Orthopedics;  Laterality: Left;   CHOLECYSTECTOMY     COLON SURGERY     sigmoid colectomy, appendectomy   I & D EXTREMITY Left 01/24/2023   Procedure: LEFT LEG DEBRIDEMENT;  Surgeon: [name], MD;  Location: [facility] OR;  Service: Orthopedics;  Laterality: Left;   INCISIONAL HERNIA REPAIR N/A 11/27/2017   Procedure: LAPAROSCOPIC ASSISTED INCISIONAL HERNIA;  Surgeon: Berna Bue, MD;  [facility];  Service: General;  Laterality: N/A;   INSERTION OF MESH N/A 11/27/2017   Procedure: INSERTION OF MESH;  Surgeon: Berna Bue, MD;  [facility];  Service: General;  Laterality: N/A;   LAPAROSCOPIC LYSIS OF ADHESIONS N/A 11/27/2017   Procedure: LAPAROSCOPIC LYSIS OF ADHESIONS;  Surgeon: Berna Bue, MD;  [facility]: [facility];  Service: General;  Laterality: N/A;   LIVER SURGERY     partial removal duke in 2003   PORT-A-CATH REMOVAL Right 07/16/2012   Procedure: REMOVAL PORT-A-CATH;  Surgeon: Currie Paris, MD;  Location: [facility];  Service: General;  Laterality: Right;   RIGHT/LEFT HEART CATH AND CORONARY ANGIOGRAPHY N/A 04/01/2021   Procedure: RIGHT/LEFT HEART CATH AND CORONARY ANGIOGRAPHY;  Surgeon: Dolores Patty, MD;  [facility];  Service: Cardiovascular;  Laterality: N/A;   Social History   Occupational History   Not on file  Tobacco Use   Smoking status: Former    Current packs/day: 0.00    Average packs/day: 0.5 packs/day for 32.0 years (16.0 ttl pk-yrs)    Types: Cigarettes    Start date: 05/19/1983    Quit date: 05/19/2015    Years since quitting: 8.1   Smokeless tobacco: Never  Vaping Use   Vaping status: Never Used  Substance and Sexual Activity   Alcohol use: No  Drug use: No   Sexual activity: Not on file

## 2023-07-19 ENCOUNTER — Encounter: Payer: Self-pay | Admitting: Thoracic Surgery (Cardiothoracic Vascular Surgery)

## 2023-07-19 ENCOUNTER — Other Ambulatory Visit (HOSPITAL_COMMUNITY): Payer: Self-pay | Admitting: Internal Medicine

## 2023-07-19 ENCOUNTER — Ambulatory Visit: Payer: 59

## 2023-07-23 ENCOUNTER — Encounter: Payer: Self-pay | Admitting: Orthopedic Surgery

## 2023-07-23 ENCOUNTER — Ambulatory Visit: Admitting: Orthopedic Surgery

## 2023-07-23 DIAGNOSIS — S8012XA Contusion of left lower leg, initial encounter: Secondary | ICD-10-CM | POA: Diagnosis not present

## 2023-07-23 DIAGNOSIS — S81812D Laceration without foreign body, left lower leg, subsequent encounter: Secondary | ICD-10-CM | POA: Diagnosis not present

## 2023-07-23 NOTE — Progress Notes (Signed)
 Office Visit Note   Patient: Raymond Allen           Date of Birth: [birth year]           MRN: [id number] Visit Date: 07/23/2023              Requested by: [place], Julie M, NP No address on file PCP: [place], Julie M, NP  Chief Complaint  Patient presents with   Left Leg - Follow-up    01/24/2023 LLE debridement and graft      HPI: Patient is a 63 year old gentleman who is 6 months status post debridement wound left lower extremity.  Patient is currently receiving with Vashe dressing changes and an Ace compression wrap.  Assessment & Plan: Visit Diagnoses:  1. Hematoma of left lower leg   2. Laceration of left lower extremity, subsequent encounter     Plan: Continue current wound care.  Follow-Up Instructions: Return in about 4 weeks (around 08/20/2023).   Ortho Exam  Patient is alert, oriented, no adenopathy, well-dressed, normal affect, normal respiratory effort. Examination the wound bed is flat with 100% healthy granulation tissue.  It measures 2 x 2 cm.  There is no cellulitis or drainage.  Imaging: No results found.    Labs: Lab Results  Component Value Date   HGBA1C 6.1 (H) 01/22/2023   HGBA1C 7.4 (H) 12/30/2021   HGBA1C 7.7 (H) 03/29/2021   CRP 4.8 (H) 01/26/2023   CRP 6.6 (H) 01/25/2023   CRP 3.2 (H) 01/24/2023   LABURIC 8.2 01/23/2023   REPTSTATUS 01/29/2023 FINAL 01/24/2023   GRAMSTAIN NO WBC SEEN NO ORGANISMS SEEN  01/24/2023   CULT  01/24/2023    RARE STAPHYLOCOCCUS SIMULANS RARE BACILLUS SPECIES Standardized susceptibility testing for this organism is not available. NO ANAEROBES ISOLATED Performed at [facility], [address], [place], [place] [address]    [facility] STAPHYLOCOCCUS SIMULANS 01/24/2023     Lab Results  Component Value Date   ALBUMIN 3.0 (L) 01/26/2023   ALBUMIN 3.1 (L) 01/25/2023   ALBUMIN 3.0 (L) 01/24/2023    Lab Results  Component Value Date   MG 2.2 01/26/2023   MG 2.2 01/25/2023   MG 2.0 01/24/2023    No results found for: "VD25OH"  No results found for: "PREALBUMIN"    Latest Ref Rng & Units 01/26/2023    3:52 AM 01/25/2023    6:26 AM 01/24/2023    3:23 AM  CBC EXTENDED  WBC 4.0 - 10.5 K/uL 7.3  8.0  6.3   RBC 4.22 - 5.81 MIL/uL 4.86  5.04  4.92   Hemoglobin 13.0 - 17.0 g/dL 62.9  52.8  41.3   HCT 39.0 - 52.0 % 40.4  41.8  41.8   Platelets 150 - 400 K/uL 282  266  234   NEUT# 1.7 - 7.7 K/uL 4.5  6.1  3.5   Lymph# 0.7 - 4.0 K/uL 1.8  1.5  1.7      There is no height or weight on file to calculate BMI.  Orders:  No orders of the defined types were placed in this encounter.  No orders of the defined types were placed in this encounter.    Procedures: No procedures performed  Clinical Data: No additional findings.  ROS:  All other systems negative, except as noted in the HPI. Review of Systems  Objective: Vital Signs: There were no vitals taken for this visit.  Specialty Comments:  No specialty comments available.  PMFS History: Patient  Active Problem List   Diagnosis Date Noted   AKI (acute kidney injury) (HCC) 01/31/2023   Hematoma of left lower leg 01/22/2023   Laceration of left lower extremity 01/22/2023   Wound cellulitis 01/22/2023   Cellulitis 01/21/2023   Chronic combined systolic and diastolic CHF (congestive heart failure) (HCC) 12/30/2021   Atrial fibrillation, chronic (HCC) 12/30/2021   Mixed hyperlipidemia 12/30/2021   Obesity (BMI 30-39.9) 12/30/2021   Pulmonary embolism (HCC) 12/29/2021   Physical deconditioning 10/14/2021   Acute systolic heart failure (HCC) 04/05/2021   Pulmonary edema 04/05/2021   Acute metabolic encephalopathy 04/05/2021   CKD (chronic kidney disease) stage 3, GFR 30-59 ml/min (HCC) 04/05/2021   Atrial arrhythmia 04/05/2021   Respiratory distress 03/29/2021   Heart failure, type unknown (HCC)    Type 2 diabetes mellitus without complication (HCC)    Incisional hernia 11/27/2017   Colon cancer metastasized to  liver (HCC) 01/05/2012   FLANK PAIN, RIGHT 01/21/2008   TOBACCO ABUSE 12/26/2007   HYPERGLYCEMIA 12/26/2007   PERS HX NONCOMPLIANCE W/MED TX PRS HAZARDS HLTH 12/26/2007   CARCINOMA, COLON 12/17/2007   Essential hypertension 12/17/2007   Respiratory failure, acute (HCC) 12/17/2007   Past Medical History:  Diagnosis Date   Cancer [facility])    Colon cancer metastasized to liver (HCC) 01/05/2012   Colon carcinoma (HCC)    colon ca dx 2001;   Diabetes mellitus without complication (HCC)    Hypertension    Muscle weakness-general 01/05/2012    History reviewed. No pertinent family history.  Past Surgical History:  Procedure Laterality Date   APPLICATION OF WOUND VAC Left 01/24/2023   Procedure: APPLICATION OF WOUND VAC;  Surgeon: [name], MD;  [facility];  Service: Orthopedics;  Laterality: Left;   CHOLECYSTECTOMY     COLON SURGERY     sigmoid colectomy, appendectomy   I & D EXTREMITY Left 01/24/2023   Procedure: LEFT LEG DEBRIDEMENT;  Surgeon: [name], MD;  Location: [facility] OR;  Service: Orthopedics;  Laterality: Left;   INCISIONAL HERNIA REPAIR N/A 11/27/2017   Procedure: LAPAROSCOPIC ASSISTED INCISIONAL HERNIA;  Surgeon: Berna Bue, MD;  [facility];  Service: General;  Laterality: N/A;   INSERTION OF MESH N/A 11/27/2017   Procedure: INSERTION OF MESH;  Surgeon: Berna Bue, MD;  [facility];  Service: General;  Laterality: N/A;   LAPAROSCOPIC LYSIS OF ADHESIONS N/A 11/27/2017   Procedure: LAPAROSCOPIC LYSIS OF ADHESIONS;  Surgeon: Berna Bue, MD;  [facility];  Service: General;  Laterality: N/A;   LIVER SURGERY     partial removal duke in 2003   PORT-A-CATH REMOVAL Right 07/16/2012   Procedure: REMOVAL PORT-A-CATH;  Surgeon: Currie Paris, MD;  Location: [facility];  Service: General;  Laterality: Right;   RIGHT/LEFT HEART CATH AND CORONARY ANGIOGRAPHY N/A 04/01/2021   Procedure: RIGHT/LEFT HEART CATH AND CORONARY  ANGIOGRAPHY;  Surgeon: Dolores Patty, MD;  [facility];  Service: Cardiovascular;  Laterality: N/A;   Social History   Occupational History   Not on file  Tobacco Use   Smoking status: Former    Current packs/day: 0.00    Average packs/day: 0.5 packs/day for 32.0 years (16.0 ttl pk-yrs)    Types: Cigarettes    Start date: 05/19/1983    Quit date: 05/19/2015    Years since quitting: 8.1   Smokeless tobacco: Never  Vaping Use   Vaping status: Never Used  Substance and Sexual Activity  Alcohol use: No   Drug use: No   Sexual activity: Not on file

## 2023-08-16 NOTE — Progress Notes (Unsigned)
 HPI: [name] is a 63 year old gentleman with a past history of type 2 diabetes mellitus, congestive heart failure thank you with nonischemic cardiomyopathy followed by advanced heart failure, hypertension, dyslipidemia, atrial flutter, and colon cancer status post sigmoid colectomy.  He also has a history of chronic saddle pulmonary embolus on chronic anticoagulation.  He has a known 4.5 cm thoracic aortic aneurysm and was referred to us  in 2023 for surveillance.  He has been seen in our office at 71-month intervals with stable ascending aortic dimensions on serial imaging.  Raymond Allen denies having any chest pain significant changes in his health since his last visit.  He said "I just do what they tell me to do".    Current Outpatient Medications  Medication Sig Dispense Refill   amiodarone  (PACERONE ) 200 MG tablet TAKE 1 TABLET (200 MG TOTAL) BY MOUTH DAILY. NEEDS FOLLOW UP APPOINTMENT FOR MORE REFILLS 90 tablet 0   ammonium lactate (AMLACTIN) 12 % cream Apply 1 Application topically daily.     carvedilol  (COREG ) 3.125 MG tablet TAKE 1 TABLET BY MOUTH TWICE A DAY 180 tablet 0   cyclobenzaprine  (FLEXERIL ) 10 MG tablet Take 10 mg by mouth 3 (three) times daily as needed for muscle spasms.     digoxin  (LANOXIN ) 0.125 MG tablet TAKE 1 TABLET (125 MCG TOTAL) BY MOUTH DAILY. NEEDS FOLLOW UP APPOINTMENT FOR MORE REFILLS 90 tablet 0   docusate sodium  (COLACE) 100 MG capsule Take 2 capsules (200 mg total) by mouth 2 (two) times daily. 20 capsule 0   doxycycline  (VIBRA -TABS) 100 MG tablet Take 1 tablet (100 mg total) by mouth 2 (two) times daily. 14 tablet 0   DULoxetine  (CYMBALTA ) 20 MG capsule Take 1 capsule (20 mg total) by mouth daily. 30 capsule 0   empagliflozin  (JARDIANCE ) 10 MG TABS tablet Take 1 tablet (10 mg total) by mouth daily. 90 tablet 2   furosemide  (LASIX ) 40 MG tablet TAKE 1 TABLET (40 MG TOTAL) BY MOUTH DAILY. NEEDS FOLLOW UP APPOINTMENT FOR FURTHER REFILLS 90 tablet 1    gabapentin  (NEURONTIN ) 100 MG capsule Take 1 capsule (100 mg total) by mouth 2 (two) times daily. 60 capsule 0   hydrALAZINE  (APRESOLINE ) 50 MG tablet Take 1 tablet (50 mg total) by mouth every 8 (eight) hours. 90 tablet 0   isosorbide  mononitrate (IMDUR ) 30 MG 24 hr tablet Take 1 tablet (30 mg total) by mouth daily. 30 tablet 0   metFORMIN (GLUCOPHAGE-XR) 500 MG 24 hr tablet Take 1,000 mg by mouth 2 (two) times daily.  1   oxyCODONE -acetaminophen  (PERCOCET) 7.5-325 MG tablet Take 1-2 tablets by mouth every 6 (six) hours as needed for severe pain. 20 tablet 0   polyethylene glycol powder (GLYCOLAX /MIRALAX ) 17 GM/SCOOP powder Take 1 capful (17 g) by mouth daily. 238 g 0   potassium chloride  SA (KLOR-CON  M) 20 MEQ tablet TAKE 1 TABLET (20 MEQ TOTAL) BY MOUTH DAILY. NEEDS FOLLOW UP APPOINTMENT FOR MORE REFILLS 30 tablet 1   rivaroxaban  (XARELTO ) 20 MG TABS tablet Take 1 tablet (20 mg total) by mouth daily.     rosuvastatin  (CRESTOR ) 20 MG tablet Take 20 mg by mouth daily.     Semaglutide, 2 MG/DOSE, (OZEMPIC, 2 MG/DOSE,) 8 MG/3ML SOPN Inject 2 mg into the skin once a week. Dose Increase     sildenafil (REVATIO) 20 MG tablet Take 20 mg by mouth daily as needed (ED).     spironolactone  (ALDACTONE ) 25 MG tablet TAKE 1 TABLET (25  MG TOTAL) BY MOUTH DAILY. NEEDS FOLLOW UP APPOINTMENT FOR MORE REFILLS 90 tablet 1   sulfamethoxazole -trimethoprim  (BACTRIM  DS) 800-160 MG tablet Take 1 tablet by mouth 2 (two) times daily. 20 tablet 0   TRESIBA FLEXTOUCH 200 UNIT/ML FlexTouch Pen Inject 25 Units into the skin every evening.     No current facility-administered medications for this visit.    Physical Exam: Vital signs BP 121/69 Heart rate 78 Respirations 20 SpO2 95% on room air  General: Obese 63 year old gentleman in no distress. Heart: Regular rate and rhythm, no murmur Chest: Breath sounds are clear to auscultation Extremities: Well-perfused with no deformities, no peripheral edema Neuro: Grossly  intact   Diagnostic Tests: CLINICAL DATA:  Follow-up thoracic aortic aneurysm   EXAM: CT ANGIOGRAPHY CHEST WITH CONTRAST   TECHNIQUE: Multidetector CT imaging of the chest was performed using the standard protocol during bolus administration of intravenous contrast. Multiplanar CT image reconstructions and MIPs were obtained to evaluate the vascular anatomy.   RADIATION DOSE REDUCTION: This exam was performed according to the departmental dose-optimization program which includes automated exposure control, adjustment of the mA and/or kV according to patient size and/or use of iterative reconstruction technique.   CONTRAST:  ISOVUE -370 IOPAMIDOL  (ISOVUE -370) INJECTION 76%   COMPARISON:  01/08/2023   FINDINGS: Cardiovascular: Preferential opacification of the thoracic aorta. No aortic dissection. Mid ascending thoracic aortic aneurysm measuring 4.5 cm in diameter (previously 4.5 cm). Tortuosity of the descending thoracic aorta. Scattered atherosclerotic vascular calcifications of the aorta and coronary arteries. Central pulmonary vasculature is also adequately opacified. Pulmonary trunk measures 3.7 cm in diameter. No central pulmonary arterial filling defects. Heart size within normal limits. No pericardial effusion.   Mediastinum/Nodes: No enlarged mediastinal, hilar, or axillary lymph nodes. Thyroid  gland, trachea, and esophagus demonstrate no significant findings.   Lungs/Pleura: Lungs are clear. No pleural effusion or pneumothorax.   Upper Abdomen: No acute abnormality.   Musculoskeletal: No chest wall abnormality. No acute or significant osseous findings.   Review of the MIP images confirms the above findings.   IMPRESSION: 1. Stable ascending thoracic aortic aneurysm measuring 4.5 cm in diameter (previously 4.5 cm). Recommend continued semiannual imaging follow-up by CTA or MRA. 2. Dilated pulmonary trunk, which can be seen in the setting of pulmonary  arterial hypertension. 3. Aortic and coronary artery atherosclerosis (ICD10-I70.0).     Electronically Signed   By: Leverne Reading D.O.   On: 07/12/2023 13:38  Impression / Plan: Stable 4.5 cm thoracic aortic aneurysm.  We reviewed the importance of careful attention to blood pressure management, avoidance of strenuous activities, and recommendations for ongoing surveillance.  Follow-up in 6 months with CTA chest   Leata Providence, PA-C [facility] [phone]

## 2023-08-20 ENCOUNTER — Encounter: Payer: Self-pay | Admitting: Physician Assistant

## 2023-08-20 ENCOUNTER — Ambulatory Visit: Attending: Thoracic Surgery (Cardiothoracic Vascular Surgery) | Admitting: Physician Assistant

## 2023-08-20 VITALS — BP 121/69 | HR 78 | Resp 20 | Ht 72.0 in | Wt 248.0 lb

## 2023-08-20 DIAGNOSIS — I7121 Aneurysm of the ascending aorta, without rupture: Secondary | ICD-10-CM | POA: Diagnosis not present

## 2023-08-20 NOTE — Patient Instructions (Signed)
 Risk Modification in those with ascending thoracic aortic aneurysm:  Continue good control of blood pressure (prefer SBP 130/80 or less)  2. Avoid fluoroquinolone antibiotics (I.e Ciprofloxacin, Avelox, Levofloxacin, Ofloxacin)  3.  Continue use of statin (You are taking rosuvastatin )  4.  Exercise and activity limitations is individualized, but in general, contact sports are to be  avoided and you should avoid lifting greater than 30 pounds  5.  Follow-up in 6 months with CTA chest

## 2023-08-21 ENCOUNTER — Ambulatory Visit: Admitting: Orthopedic Surgery

## 2023-08-22 ENCOUNTER — Ambulatory Visit (INDEPENDENT_AMBULATORY_CARE_PROVIDER_SITE_OTHER): Admitting: Orthopedic Surgery

## 2023-08-22 DIAGNOSIS — S81812D Laceration without foreign body, left lower leg, subsequent encounter: Secondary | ICD-10-CM

## 2023-08-22 DIAGNOSIS — S8012XA Contusion of left lower leg, initial encounter: Secondary | ICD-10-CM | POA: Diagnosis not present

## 2023-08-23 ENCOUNTER — Encounter: Payer: Self-pay | Admitting: Orthopedic Surgery

## 2023-08-23 NOTE — Progress Notes (Signed)
 Office Visit Note   Patient: Raymond Allen           Date of Birth: November 21, 1960           MRN: [id number] Visit Date: 08/22/2023              Requested by: [place], Julie M, NP No address on file PCP: [place], Julie M, NP  Chief Complaint  Patient presents with   Left Leg - Follow-up    01/24/2023 LLE debridement and graft      HPI: Patient is a 63 year old gentleman who is status post debridement left leg and October 2024.  Assessment & Plan: Visit Diagnoses:  1. Hematoma of left lower leg   2. Laceration of left lower extremity, subsequent encounter     Plan: Continue compression with Vashe dressing changes.  Follow-Up Instructions: Return in about 4 weeks (around 09/19/2023).   Ortho Exam  Patient is alert, oriented, no adenopathy, well-dressed, normal affect, normal respiratory effort. Examination the wound bed has flat healthy 100% granulation tissue.  The wound measures 1 x 1.5 cm in the lateral left calf.  There is good wrinkling of the skin no surrounding cellulitis no drainage.  Imaging: No results found.   Labs: Lab Results  Component Value Date   HGBA1C 6.1 (H) 01/22/2023   HGBA1C 7.4 (H) 12/30/2021   HGBA1C 7.7 (H) 03/29/2021   CRP 4.8 (H) 01/26/2023   CRP 6.6 (H) 01/25/2023   CRP 3.2 (H) 01/24/2023   LABURIC 8.2 01/23/2023   REPTSTATUS 01/29/2023 FINAL 01/24/2023   GRAMSTAIN NO WBC SEEN NO ORGANISMS SEEN  01/24/2023   CULT  01/24/2023    RARE STAPHYLOCOCCUS SIMULANS RARE BACILLUS SPECIES Standardized susceptibility testing for this organism is not available. NO ANAEROBES ISOLATED Performed at [facility], [address], [place], [place] [address]    [facility] STAPHYLOCOCCUS SIMULANS 01/24/2023     Lab Results  Component Value Date   ALBUMIN 3.0 (L) 01/26/2023   ALBUMIN 3.1 (L) 01/25/2023   ALBUMIN 3.0 (L) 01/24/2023    Lab Results  Component Value Date   MG 2.2 01/26/2023   MG 2.2 01/25/2023   MG 2.0 01/24/2023   No  results found for: "VD25OH"  No results found for: "PREALBUMIN"    Latest Ref Rng & Units 01/26/2023    3:52 AM 01/25/2023    6:26 AM 01/24/2023    3:23 AM  CBC EXTENDED  WBC 4.0 - 10.5 K/uL 7.3  8.0  6.3   RBC 4.22 - 5.81 MIL/uL 4.86  5.04  4.92   Hemoglobin 13.0 - 17.0 g/dL 19.1  47.8  29.5   HCT 39.0 - 52.0 % 40.4  41.8  41.8   Platelets 150 - 400 K/uL 282  266  234   NEUT# 1.7 - 7.7 K/uL 4.5  6.1  3.5   Lymph# 0.7 - 4.0 K/uL 1.8  1.5  1.7      There is no height or weight on file to calculate BMI.  Orders:  No orders of the defined types were placed in this encounter.  No orders of the defined types were placed in this encounter.    Procedures: No procedures performed  Clinical Data: No additional findings.  ROS:  All other systems negative, except as noted in the HPI. Review of Systems  Objective: Vital Signs: There were no vitals taken for this visit.  Specialty Comments:  No specialty comments available.  PMFS History: Patient Active Problem List  Diagnosis Date Noted   AKI (acute kidney injury) (HCC) 01/31/2023   Hematoma of left lower leg 01/22/2023   Laceration of left lower extremity 01/22/2023   Wound cellulitis 01/22/2023   Cellulitis 01/21/2023   Chronic combined systolic and diastolic CHF (congestive heart failure) (HCC) 12/30/2021   Atrial fibrillation, chronic (HCC) 12/30/2021   Mixed hyperlipidemia 12/30/2021   Obesity (BMI 30-39.9) 12/30/2021   Pulmonary embolism (HCC) 12/29/2021   Physical deconditioning 10/14/2021   Acute systolic heart failure (HCC) 04/05/2021   Pulmonary edema 04/05/2021   Acute metabolic encephalopathy 04/05/2021   CKD (chronic kidney disease) stage 3, GFR 30-59 ml/min (HCC) 04/05/2021   Atrial arrhythmia 04/05/2021   Respiratory distress 03/29/2021   Heart failure, type unknown (HCC)    Type 2 diabetes mellitus without complication (HCC)    Incisional hernia 11/27/2017   Colon cancer metastasized to liver  (HCC) 01/05/2012   FLANK PAIN, RIGHT 01/21/2008   TOBACCO ABUSE 12/26/2007   HYPERGLYCEMIA 12/26/2007   PERS HX NONCOMPLIANCE W/MED TX PRS HAZARDS HLTH 12/26/2007   CARCINOMA, COLON 12/17/2007   Essential hypertension 12/17/2007   Respiratory failure, acute (HCC) 12/17/2007   Past Medical History:  Diagnosis Date   Cancer [facility])    Colon cancer metastasized to liver (HCC) 01/05/2012   Colon carcinoma (HCC)    colon ca dx 2001;   Diabetes mellitus without complication (HCC)    Hypertension    Muscle weakness-general 01/05/2012    History reviewed. No pertinent family history.  Past Surgical History:  Procedure Laterality Date   APPLICATION OF WOUND VAC Left 01/24/2023   Procedure: APPLICATION OF WOUND VAC;  Surgeon: Timothy Ford, MD;  Location: [facility] OR;  Service: Orthopedics;  Laterality: Left;   CHOLECYSTECTOMY     COLON SURGERY     sigmoid colectomy, appendectomy   I & D EXTREMITY Left 01/24/2023   Procedure: LEFT LEG DEBRIDEMENT;  Surgeon: Timothy Ford, MD;  Location: [facility] OR;  Service: Orthopedics;  Laterality: Left;   INCISIONAL HERNIA REPAIR N/A 11/27/2017   Procedure: LAPAROSCOPIC ASSISTED INCISIONAL HERNIA;  Surgeon: Adalberto Acton, MD;  Location: [facility] ORS;  Service: General;  Laterality: N/A;   INSERTION OF MESH N/A 11/27/2017   Procedure: INSERTION OF MESH;  Surgeon: Adalberto Acton, MD;  Location: [facility] ORS;  Service: General;  Laterality: N/A;   LAPAROSCOPIC LYSIS OF ADHESIONS N/A 11/27/2017   Procedure: LAPAROSCOPIC LYSIS OF ADHESIONS;  Surgeon: Adalberto Acton, MD;  Location: [facility] ORS;  Service: General;  Laterality: N/A;   LIVER SURGERY     partial removal [facility] in 2003   PORT-A-CATH REMOVAL Right 07/16/2012   Procedure: REMOVAL PORT-A-CATH;  Surgeon: Darcella Earnest, MD;  Location: [facility];  Service: General;  Laterality: Right;   RIGHT/LEFT HEART CATH AND CORONARY ANGIOGRAPHY N/A 04/01/2021   Procedure: RIGHT/LEFT HEART CATH AND CORONARY ANGIOGRAPHY;   Surgeon: Mardell Shade, MD;  Location: [facility] INV[facility] LAB;  Service: Cardiovascular;  Laterality: N/A;   Social History   Occupational History   Not on file  Tobacco Use   Smoking status: Former    Current packs/day: 0.00    Average packs/day: 0.5 packs/day for 32.0 years (16.0 ttl pk-yrs)    Types: Cigarettes    Start date: 05/19/1983    Quit date: 05/19/2015    Years since quitting: 8.2   Smokeless tobacco: Never  Vaping Use   Vaping status: Never Used  Substance and Sexual Activity   Alcohol  use: No  Drug use: No   Sexual activity: Not on file

## 2023-08-25 ENCOUNTER — Other Ambulatory Visit (HOSPITAL_COMMUNITY): Payer: Self-pay | Admitting: Internal Medicine

## 2023-09-14 ENCOUNTER — Other Ambulatory Visit (HOSPITAL_COMMUNITY): Payer: Self-pay | Admitting: Internal Medicine

## 2023-09-24 ENCOUNTER — Ambulatory Visit: Admitting: Orthopedic Surgery

## 2023-10-01 ENCOUNTER — Ambulatory Visit (INDEPENDENT_AMBULATORY_CARE_PROVIDER_SITE_OTHER): Admitting: Orthopedic Surgery

## 2023-10-01 ENCOUNTER — Encounter: Payer: Self-pay | Admitting: Orthopedic Surgery

## 2023-10-01 DIAGNOSIS — S81812D Laceration without foreign body, left lower leg, subsequent encounter: Secondary | ICD-10-CM | POA: Diagnosis not present

## 2023-10-01 DIAGNOSIS — S8012XA Contusion of left lower leg, initial encounter: Secondary | ICD-10-CM

## 2023-10-01 NOTE — Progress Notes (Unsigned)
 Office Visit Note   Patient: Raymond Allen           Date of Birth: May 08, 1960           MRN: [id number] Visit Date: 10/01/2023              Requested by: [place], Julie M, NP No address on file PCP: [place], Julie M, NP  Chief Complaint  Patient presents with   Left Leg - Follow-up      HPI: Patient is a 63 year old gentleman who is status post debridement left leg for hematoma sustained in  October 2024. Patient is currently receiving with Vashe dressing changes and an Ace compression wrap.   Assessment & Plan: Visit Diagnoses:  1. Hematoma of left lower leg   2. Laceration of left lower extremity, subsequent encounter     Plan: wash with soap and water followed by Vashe dressing changes and an Ace compression wrap. He is followed by the Isacod study.    Follow-Up Instructions: Return in about 4 weeks (around 10/29/2023).   Ortho Exam  Patient is alert, oriented, no adenopathy, well-dressed, normal affect, normal respiratory effort. Beefy red base, 100 % granulation tissue.  Wound measures 6 mm x 11 mm.     Imaging: No results found.   Labs: Lab Results  Component Value Date   HGBA1C 6.1 (H) 01/22/2023   HGBA1C 7.4 (H) 12/30/2021   HGBA1C 7.7 (H) 03/29/2021   CRP 4.8 (H) 01/26/2023   CRP 6.6 (H) 01/25/2023   CRP 3.2 (H) 01/24/2023   LABURIC 8.2 01/23/2023   REPTSTATUS 01/29/2023 FINAL 01/24/2023   GRAMSTAIN NO WBC SEEN NO ORGANISMS SEEN  01/24/2023   CULT  01/24/2023    RARE STAPHYLOCOCCUS SIMULANS RARE BACILLUS SPECIES Standardized susceptibility testing for this organism is not available. NO ANAEROBES ISOLATED Performed at [facility], [address] [place], [place] [address]    [facility] STAPHYLOCOCCUS SIMULANS 01/24/2023     Lab Results  Component Value Date   ALBUMIN 3.0 (L) 01/26/2023   ALBUMIN 3.1 (L) 01/25/2023   ALBUMIN 3.0 (L) 01/24/2023    Lab Results  Component Value Date   MG 2.2 01/26/2023   MG 2.2 01/25/2023   MG  2.0 01/24/2023   No results found for: VD25OH  No results found for: PREALBUMIN    Latest Ref Rng & Units 01/26/2023    3:52 AM 01/25/2023    6:26 AM 01/24/2023    3:23 AM  CBC EXTENDED  WBC 4.0 - 10.5 K/uL 7.3  8.0  6.3   RBC 4.22 - 5.81 MIL/uL 4.86  5.04  4.92   Hemoglobin 13.0 - 17.0 g/dL 53.6  64.4  03.4   HCT 39.0 - 52.0 % 40.4  41.8  41.8   Platelets 150 - 400 K/uL 282  266  234   NEUT# 1.7 - 7.7 K/uL 4.5  6.1  3.5   Lymph# 0.7 - 4.0 K/uL 1.8  1.5  1.7      There is no height or weight on file to calculate BMI.  Orders:  No orders of the defined types were placed in this encounter.  No orders of the defined types were placed in this encounter.    Procedures: No procedures performed  Clinical Data: No additional findings.  ROS:  All other systems negative, except as noted in the HPI. Review of Systems  Objective: Vital Signs: There were no vitals taken for this visit.  Specialty Comments:  No  specialty comments available.  PMFS History: Patient Active Problem List   Diagnosis Date Noted   AKI (acute kidney injury) (HCC) 01/31/2023   Hematoma of left lower leg 01/22/2023   Laceration of left lower extremity 01/22/2023   Wound cellulitis 01/22/2023   Cellulitis 01/21/2023   Chronic combined systolic and diastolic CHF (congestive heart failure) (HCC) 12/30/2021   Atrial fibrillation, chronic (HCC) 12/30/2021   Mixed hyperlipidemia 12/30/2021   Obesity (BMI 30-39.9) 12/30/2021   Pulmonary embolism (HCC) 12/29/2021   Physical deconditioning 10/14/2021   Acute systolic heart failure (HCC) 04/05/2021   Pulmonary edema 04/05/2021   Acute metabolic encephalopathy 04/05/2021   CKD (chronic kidney disease) stage 3, GFR 30-59 ml/min (HCC) 04/05/2021   Atrial arrhythmia 04/05/2021   Respiratory distress 03/29/2021   Heart failure, type unknown (HCC)    Type 2 diabetes mellitus without complication (HCC)    Incisional hernia 11/27/2017   Colon cancer  metastasized to liver (HCC) 01/05/2012   FLANK PAIN, RIGHT 01/21/2008   TOBACCO ABUSE 12/26/2007   HYPERGLYCEMIA 12/26/2007   PERS HX NONCOMPLIANCE W/MED TX PRS HAZARDS HLTH 12/26/2007   CARCINOMA, COLON 12/17/2007   Essential hypertension 12/17/2007   Respiratory failure, acute (HCC) 12/17/2007   Past Medical History:  Diagnosis Date   Cancer [facility])    Colon cancer metastasized to liver (HCC) 01/05/2012   Colon carcinoma (HCC)    colon ca dx 2001;   Diabetes mellitus without complication (HCC)    Hypertension    Muscle weakness-general 01/05/2012    History reviewed. No pertinent family history.  Past Surgical History:  Procedure Laterality Date   APPLICATION OF WOUND VAC Left 01/24/2023   Procedure: APPLICATION OF WOUND VAC;  Surgeon: Timothy Ford, MD;  Location: [facility] OR;  Service: Orthopedics;  Laterality: Left;   CHOLECYSTECTOMY     COLON SURGERY     sigmoid colectomy, appendectomy   I & D EXTREMITY Left 01/24/2023   Procedure: LEFT LEG DEBRIDEMENT;  Surgeon: Timothy Ford, MD;  Location: [facility] OR;  Service: Orthopedics;  Laterality: Left;   INCISIONAL HERNIA REPAIR N/A 11/27/2017   Procedure: LAPAROSCOPIC ASSISTED INCISIONAL HERNIA;  Surgeon: Adalberto Acton, MD;  Location: [facility];  Service: General;  Laterality: N/A;   INSERTION OF MESH N/A 11/27/2017   Procedure: INSERTION OF MESH;  Surgeon: Adalberto Acton, MD;  Location: [facility];  Service: General;  Laterality: N/A;   LAPAROSCOPIC LYSIS OF ADHESIONS N/A 11/27/2017   Procedure: LAPAROSCOPIC LYSIS OF ADHESIONS;  Surgeon: Adalberto Acton, MD;  Location: [facility];  Service: General;  Laterality: N/A;   LIVER SURGERY     partial removal [facility] in 2003   PORT-A-CATH REMOVAL Right 07/16/2012   Procedure: REMOVAL PORT-A-CATH;  Surgeon: Darcella Earnest, MD;  Location: [facility];  Service: General;  Laterality: Right;   RIGHT/LEFT HEART CATH AND CORONARY ANGIOGRAPHY N/A 04/01/2021   Procedure: RIGHT/LEFT HEART CATH AND  CORONARY ANGIOGRAPHY;  Surgeon: Mardell Shade, MD;  Location: [facility];  Service: Cardiovascular;  Laterality: N/A;   Social History   Occupational History   Not on file  Tobacco Use   Smoking status: Former    Current packs/day: 0.00    Average packs/day: 0.5 packs/day for 32.0 years (16.0 ttl pk-yrs)    Types: Cigarettes    Start date: 05/19/1983    Quit date: 05/19/2015    Years since quitting: 8.3   Smokeless tobacco: Never  Vaping Use   Vaping status: Never  Used  Substance and Sexual Activity   Alcohol  use: No   Drug use: No   Sexual activity: Not on file

## 2023-10-11 ENCOUNTER — Other Ambulatory Visit (HOSPITAL_COMMUNITY): Payer: Self-pay | Admitting: Internal Medicine

## 2023-11-01 ENCOUNTER — Ambulatory Visit (INDEPENDENT_AMBULATORY_CARE_PROVIDER_SITE_OTHER): Admitting: Orthopedic Surgery

## 2023-11-01 ENCOUNTER — Encounter (HOSPITAL_COMMUNITY): Admitting: Internal Medicine

## 2023-11-01 DIAGNOSIS — S8012XA Contusion of left lower leg, initial encounter: Secondary | ICD-10-CM

## 2023-11-06 ENCOUNTER — Encounter: Payer: Self-pay | Admitting: Orthopedic Surgery

## 2023-11-06 NOTE — Progress Notes (Signed)
 Office Visit Note   Patient: Raymond Allen           Date of Birth: 1960/06/08           MRN: [id number] Visit Date: 11/01/2023              Requested by: [place], Julie M, NP No address on file PCP: [place], Julie M, NP  Chief Complaint  Patient presents with   Left Leg - Wound Check      HPI: Patient is a 63 year old gentleman who is status post left leg debridement January 24, 2023.  Patient is currently cleansing with Dial soap using Vashe and an Ace wrap.  Assessment & Plan: Visit Diagnoses:  1. Hematoma of left lower leg     Plan: Recommend to continue compression.  Continue with the Vashe dressing changes.  Follow-Up Instructions: No follow-ups on file.   Ortho Exam  Patient is alert, oriented, no adenopathy, well-dressed, normal affect, normal respiratory effort. Examination there is decreased swelling there is significant healing of the wound.  Wound currently measures less than 1 cm in diameter.    Imaging: No results found.   Labs: Lab Results  Component Value Date   HGBA1C 6.1 (H) 01/22/2023   HGBA1C 7.4 (H) 12/30/2021   HGBA1C 7.7 (H) 03/29/2021   CRP 4.8 (H) 01/26/2023   CRP 6.6 (H) 01/25/2023   CRP 3.2 (H) 01/24/2023   LABURIC 8.2 01/23/2023   REPTSTATUS 01/29/2023 FINAL 01/24/2023   GRAMSTAIN NO WBC SEEN NO ORGANISMS SEEN  01/24/2023   CULT  01/24/2023    RARE STAPHYLOCOCCUS SIMULANS RARE BACILLUS SPECIES Standardized susceptibility testing for this organism is not available. NO ANAEROBES ISOLATED Performed at [facility], [address], [place], [place] [address]    [facility] STAPHYLOCOCCUS SIMULANS 01/24/2023     Lab Results  Component Value Date   ALBUMIN 3.0 (L) 01/26/2023   ALBUMIN 3.1 (L) 01/25/2023   ALBUMIN 3.0 (L) 01/24/2023    Lab Results  Component Value Date   MG 2.2 01/26/2023   MG 2.2 01/25/2023   MG 2.0 01/24/2023   No results found for: VD25OH  No results found for: PREALBUMIN    Latest Ref  Rng & Units 01/26/2023    3:52 AM 01/25/2023    6:26 AM 01/24/2023    3:23 AM  CBC EXTENDED  WBC 4.0 - 10.5 K/uL 7.3  8.0  6.3   RBC 4.22 - 5.81 MIL/uL 4.86  5.04  4.92   Hemoglobin 13.0 - 17.0 g/dL 87.7  87.3  87.4   HCT 39.0 - 52.0 % 40.4  41.8  41.8   Platelets 150 - 400 K/uL 282  266  234   NEUT# 1.7 - 7.7 K/uL 4.5  6.1  3.5   Lymph# 0.7 - 4.0 K/uL 1.8  1.5  1.7      There is no height or weight on file to calculate BMI.  Orders:  No orders of the defined types were placed in this encounter.  No orders of the defined types were placed in this encounter.    Procedures: No procedures performed  Clinical Data: No additional findings.  ROS:  All other systems negative, except as noted in the HPI. Review of Systems  Objective: Vital Signs: There were no vitals taken for this visit.  Specialty Comments:  No specialty comments available.  PMFS History: Patient Active Problem List   Diagnosis Date Noted   AKI (acute kidney injury) (HCC) 01/31/2023  Hematoma of left lower leg 01/22/2023   Laceration of left lower extremity 01/22/2023   Wound cellulitis 01/22/2023   Cellulitis 01/21/2023   Chronic combined systolic and diastolic CHF (congestive heart failure) (HCC) 12/30/2021   Atrial fibrillation, chronic (HCC) 12/30/2021   Mixed hyperlipidemia 12/30/2021   Obesity (BMI 30-39.9) 12/30/2021   Pulmonary embolism (HCC) 12/29/2021   Physical deconditioning 10/14/2021   Acute systolic heart failure (HCC) 04/05/2021   Pulmonary edema 04/05/2021   Acute metabolic encephalopathy 04/05/2021   CKD (chronic kidney disease) stage 3, GFR 30-59 ml/min (HCC) 04/05/2021   Atrial arrhythmia 04/05/2021   Respiratory distress 03/29/2021   Heart failure, type unknown (HCC)    Type 2 diabetes mellitus without complication (HCC)    Incisional hernia 11/27/2017   Colon cancer metastasized to liver (HCC) 01/05/2012   FLANK PAIN, RIGHT 01/21/2008   TOBACCO ABUSE 12/26/2007    HYPERGLYCEMIA 12/26/2007   PERS HX NONCOMPLIANCE W/MED TX PRS HAZARDS HLTH 12/26/2007   CARCINOMA, COLON 12/17/2007   Essential hypertension 12/17/2007   Respiratory failure, acute (HCC) 12/17/2007   Past Medical History:  Diagnosis Date   Cancer [facility])    Colon cancer metastasized to liver (HCC) 01/05/2012   Colon carcinoma (HCC)    colon ca dx 2001;   Diabetes mellitus without complication (HCC)    Hypertension    Muscle weakness-general 01/05/2012    History reviewed. No pertinent family history.  Past Surgical History:  Procedure Laterality Date   APPLICATION OF WOUND VAC Left 01/24/2023   Procedure: APPLICATION OF WOUND VAC;  Surgeon: Harden Jerona GAILS, MD;  Location: [facility] OR;  Service: Orthopedics;  Laterality: Left;   CHOLECYSTECTOMY     COLON SURGERY     sigmoid colectomy, appendectomy   I & D EXTREMITY Left 01/24/2023   Procedure: LEFT LEG DEBRIDEMENT;  Surgeon: Harden Jerona GAILS, MD;  Location: [facility] OR;  Service: Orthopedics;  Laterality: Left;   INCISIONAL HERNIA REPAIR N/A 11/27/2017   Procedure: LAPAROSCOPIC ASSISTED INCISIONAL HERNIA;  Surgeon: Signe Mitzie LABOR, MD;  Location: [facility] ORS;  Service: General;  Laterality: N/A;   INSERTION OF MESH N/A 11/27/2017   Procedure: INSERTION OF MESH;  Surgeon: Signe Mitzie LABOR, MD;  Location: [facility] ORS;  Service: General;  Laterality: N/A;   LAPAROSCOPIC LYSIS OF ADHESIONS N/A 11/27/2017   Procedure: LAPAROSCOPIC LYSIS OF ADHESIONS;  Surgeon: Signe Mitzie LABOR, MD;  Location: [facility] ORS;  Service: General;  Laterality: N/A;   LIVER SURGERY     partial removal [facility] in 2003   PORT-A-CATH REMOVAL Right 07/16/2012   Procedure: REMOVAL PORT-A-CATH;  Surgeon: Sherlean JINNY Laughter, MD;  Location: [facility];  Service: General;  Laterality: Right;   RIGHT/LEFT HEART CATH AND CORONARY ANGIOGRAPHY N/A 04/01/2021   Procedure: RIGHT/LEFT HEART CATH AND CORONARY ANGIOGRAPHY;  Surgeon: Cherrie Toribio SAUNDERS, MD;  Location: [facility] INV[facility]IVE [facility] LAB;  Service:  Cardiovascular;  Laterality: N/A;   Social History   Occupational History   Not on file  Tobacco Use   Smoking status: Former    Current packs/day: 0.00    Average packs/day: 0.5 packs/day for 32.0 years (16.0 ttl pk-yrs)    Types: Cigarettes    Start date: 05/19/1983    Quit date: 05/19/2015    Years since quitting: 8.4   Smokeless tobacco: Never  Vaping Use   Vaping status: Never Used  Substance and Sexual Activity   Alcohol  use: No   Drug use: No   Sexual activity: Not on file

## 2023-11-15 ENCOUNTER — Ambulatory Visit (HOSPITAL_COMMUNITY)
Admission: RE | Admit: 2023-11-15 | Discharge: 2023-11-15 | Disposition: A | Discharge status: Home / Self Care | Source: Ambulatory Visit | Attending: Cardiology | Admitting: Cardiology

## 2023-11-15 ENCOUNTER — Encounter (HOSPITAL_COMMUNITY): Payer: Self-pay

## 2023-11-15 VITALS — BP 152/82 | HR 82 | Ht 72.0 in | Wt 236.0 lb

## 2023-11-15 DIAGNOSIS — M549 Dorsalgia, unspecified: Secondary | ICD-10-CM | POA: Insufficient documentation

## 2023-11-15 DIAGNOSIS — I340 Nonrheumatic mitral (valve) insufficiency: Secondary | ICD-10-CM | POA: Insufficient documentation

## 2023-11-15 DIAGNOSIS — Z86718 Personal history of other venous thrombosis and embolism: Secondary | ICD-10-CM | POA: Insufficient documentation

## 2023-11-15 DIAGNOSIS — Z7901 Long term (current) use of anticoagulants: Secondary | ICD-10-CM | POA: Diagnosis not present

## 2023-11-15 DIAGNOSIS — I5022 Chronic systolic (congestive) heart failure: Secondary | ICD-10-CM | POA: Diagnosis not present

## 2023-11-15 DIAGNOSIS — Z9221 Personal history of antineoplastic chemotherapy: Secondary | ICD-10-CM | POA: Diagnosis not present

## 2023-11-15 DIAGNOSIS — I5042 Chronic combined systolic (congestive) and diastolic (congestive) heart failure: Secondary | ICD-10-CM

## 2023-11-15 DIAGNOSIS — I428 Other cardiomyopathies: Secondary | ICD-10-CM | POA: Diagnosis not present

## 2023-11-15 DIAGNOSIS — Z79899 Other long term (current) drug therapy: Secondary | ICD-10-CM | POA: Diagnosis not present

## 2023-11-15 DIAGNOSIS — I110 Hypertensive heart disease with heart failure: Secondary | ICD-10-CM | POA: Diagnosis not present

## 2023-11-15 DIAGNOSIS — Z85038 Personal history of other malignant neoplasm of large intestine: Secondary | ICD-10-CM | POA: Diagnosis not present

## 2023-11-15 DIAGNOSIS — E785 Hyperlipidemia, unspecified: Secondary | ICD-10-CM | POA: Insufficient documentation

## 2023-11-15 DIAGNOSIS — R0609 Other forms of dyspnea: Secondary | ICD-10-CM | POA: Diagnosis present

## 2023-11-15 DIAGNOSIS — Z86711 Personal history of pulmonary embolism: Secondary | ICD-10-CM | POA: Diagnosis not present

## 2023-11-15 DIAGNOSIS — E119 Type 2 diabetes mellitus without complications: Secondary | ICD-10-CM | POA: Diagnosis not present

## 2023-11-15 DIAGNOSIS — Z87891 Personal history of nicotine dependence: Secondary | ICD-10-CM | POA: Insufficient documentation

## 2023-11-15 DIAGNOSIS — Z6832 Body mass index (BMI) 32.0-32.9, adult: Secondary | ICD-10-CM | POA: Insufficient documentation

## 2023-11-15 DIAGNOSIS — R5383 Other fatigue: Secondary | ICD-10-CM | POA: Diagnosis present

## 2023-11-15 DIAGNOSIS — Z7984 Long term (current) use of oral hypoglycemic drugs: Secondary | ICD-10-CM | POA: Insufficient documentation

## 2023-11-15 DIAGNOSIS — I7121 Aneurysm of the ascending aorta, without rupture: Secondary | ICD-10-CM | POA: Diagnosis not present

## 2023-11-15 LAB — CBC
HCT: 50.3 % (ref 39.0–52.0)
Hemoglobin: 15.1 g/dL (ref 13.0–17.0)
MCH: 24.5 pg — ABNORMAL LOW (ref 26.0–34.0)
MCHC: 30 g/dL (ref 30.0–36.0)
MCV: 81.5 fL (ref 80.0–100.0)
Platelets: 272 K/uL (ref 150–400)
RBC: 6.17 MIL/uL — ABNORMAL HIGH (ref 4.22–5.81)
RDW: 15.1 % (ref 11.5–15.5)
WBC: 5.5 K/uL (ref 4.0–10.5)
nRBC: 0 % (ref 0.0–0.2)

## 2023-11-15 LAB — COMPREHENSIVE METABOLIC PANEL WITH GFR
ALT: 13 U/L (ref 0–44)
AST: 16 U/L (ref 15–41)
Albumin: 3.6 g/dL (ref 3.5–5.0)
Alkaline Phosphatase: 60 U/L (ref 38–126)
Anion gap: 9 (ref 5–15)
BUN: 12 mg/dL (ref 8–23)
CO2: 27 mmol/L (ref 22–32)
Calcium: 9.5 mg/dL (ref 8.9–10.3)
Chloride: 101 mmol/L (ref 98–111)
Creatinine, Ser: 1.44 mg/dL — ABNORMAL HIGH (ref 0.61–1.24)
GFR, Estimated: 54 mL/min — ABNORMAL LOW (ref >60)
Glucose, Bld: 87 mg/dL (ref 70–99)
Potassium: 4.6 mmol/L (ref 3.5–5.1)
Sodium: 137 mmol/L (ref 135–145)
Total Bilirubin: 1.8 mg/dL — ABNORMAL HIGH (ref 0.0–1.2)
Total Protein: 7.9 g/dL (ref 6.5–8.1)

## 2023-11-15 LAB — SEDIMENTATION RATE: Sed Rate: 8 mm/h (ref 0–16)

## 2023-11-15 LAB — TSH: TSH: 0.838 u[IU]/mL (ref 0.350–4.500)

## 2023-11-15 LAB — DIGOXIN LEVEL: Digoxin Level: 0.5 ng/mL — ABNORMAL LOW (ref 0.8–2.0)

## 2023-11-15 LAB — BRAIN NATRIURETIC PEPTIDE: B Natriuretic Peptide: 9.7 pg/mL (ref 0.0–100.0)

## 2023-11-15 MED ORDER — SACUBITRIL-VALSARTAN 24-26 MG PO TABS: 1.0000 | ORAL_TABLET | Freq: Two times a day (BID) | ORAL | 3 refills | Status: DC

## 2023-11-15 NOTE — Patient Instructions (Addendum)
 Medication Changes:  RESTART ENTRESTO  24/26MG  TWICE DAILY   Lab Work:  Labs done today, your results will be available in MyChart, we will contact you for abnormal readings.  Testing/Procedures:  ECHOCARDIOGRAM AS SCHEDULED   Follow-Up in: 2 WEEKS AS SCHEDULED WITH APP   At the [facility], you and your health needs are our priority. We have a designated team specialized in the treatment of Heart Failure. This Care Team includes your primary Heart Failure Specialized Cardiologist (physician), Advanced Practice Providers (APPs- Physician Assistants and Nurse Practitioners), and Pharmacist who all work together to provide you with the care you need, when you need it.   You may see any of the following providers on your designated Care Team at your next follow up:  Dr. Toribio Fuel Dr. Ezra Shuck Dr. Ria Commander Dr. Odis Brownie Greig Mosses, NP Caffie Shed, [place] [facility] [place], [place] Beckey Coe, NP [place] Lee, NP Tinnie Redman, PharmD   Please be sure to bring in all your medications bottles to every appointment.   Need to Contact Us :  If you have any questions or concerns before your next appointment please send us  a message through [employer] or call our office at [phone].    TO LEAVE A MESSAGE FOR THE NURSE SELECT OPTION 2, PLEASE LEAVE A MESSAGE INCLUDING: YOUR NAME DATE OF BIRTH CALL BACK NUMBER REASON FOR CALL**this is important as we prioritize the call backs  YOU WILL RECEIVE A CALL BACK THE SAME DAY AS LONG AS YOU CALL BEFORE 4:00 PM

## 2023-11-15 NOTE — Progress Notes (Signed)
 ReDS Vest / Clip - 11/15/23 1633       ReDS Vest / Clip   Station Marker D    Ruler Value 39    ReDS Value Range Low volume    ReDS Actual Value 26

## 2023-11-15 NOTE — Addendum Note (Signed)
 Encounter addended by: Tita Andriette NOVAK, RN on: 11/15/2023 5:08 PM  Actions taken: Clinical Note Signed

## 2023-11-15 NOTE — Progress Notes (Signed)
 ADVANCED [facility] CLINIC NOTE   Primary Care: [place], Julie M, NP HF Cardiologist: Dr. Cherrie  Reason for visit: F/u for chronic systolic heart failure   HPI:  Raymond Allen is a 63 y.o. AAM w/ remote h/o stage III colon cancer, diagnosed in October 2001, HTN, Type 2DM, HLD, former smoker, h/o LLE DVT on Xarelto  and systolic HF/ NICM.   He received initial adjuvant chemotherapy and underwent partial colectomy. He had mets to liver in October 2002. He underwent partial hepatectomy at [facility] in February 2003. He received neoadjuvant chemotherapy with FOLFIRI then adjuvant FOLFOX following the surgery. He was put on maintenance Xeloda. He has been off all chemotherapy since September of 2003.   Admitted 12/22 with new CHF and acute hypoxic respiratory failure, requiring intubation. Echo LVEF 25-30% w/ global HK, RV mildly reduced Mod-severe MR. R/LHC 12/22  minimal CAD. Etiology for CM unclear. Hospitalization c/b sepsis 2/2 PNA, atrial tachycardia with RVR, and AKI  Admitted 12/29/21 with leg swelling and SOB. Found to have acute left lower extremity DVT and saddle pulmonary embolus on CT imaging.  Xarelto  stopped and switched to Lovenox   Echo 12/30/21: EF 30-35% RV ok   Echo 07/15/21 EF 30-35% mild MR   Echo 9/23 EF 30-35%, RV nl. Trivial MR.   He presents back today for f/u as advised by his PCP. He has not been seen in nearly 2 years. Last OV was 01/2022. He is here w/ his girlfriend. Says he is struggling to get around w/ NYHA Class IIl symptoms w/ exertional dyspnea and fatigue. He is not markedly volume overloaded on exam. ReDs 26%.  EKG shows NSR 82 bpm. He reports continued compliance w/ Xareleto. Per med list, he is still on Farxiga , spiro, coreg , Imdur  and digoxin . He is no longer on Entresto  (unclear reasons). He denies ever having to stop a med due to low BP or dizziness. I reviewed last set of labs, SCr and K both ok. He also not certain if he is taking hydralazine . He states he is  not taking any meds tid. BP is elevated in clinic today, 158/82 but systolic BP at home typically better in the 120s.     Cardiac Studies - Echo (12/22): biventricular dysfunction. LVEF severely reduced 25-30% w/ global HK, RV mildly reduced , mildly elevated RVSP 38 mmH, Mod-severe MR.   - R/LHC (12/22):      Prox RCA to Mid RCA lesion is 40% stenosed.   Prox Cx to Mid Cx lesion is 50% stenosed.   Mid LAD lesion is 30% stenosed.   Findings: Ao = 61/48 (52) -> NE restarted  LV = 94/27 -> on NE RA =  8 RV = 27/8 PA = 29/12 (22) PCW = 14 Fick cardiac output/index = 6.5/2.7 (prior to NE) PVR = 1.2 WU SVR = 539 Ao sat = 95% PA sat = 66%, 64% PAPi = 2.1   Past Medical History:  Diagnosis Date   Cancer (HCC)    Colon cancer metastasized to liver (HCC) 01/05/2012   Colon carcinoma (HCC)    colon ca dx 2001;   Diabetes mellitus without complication (HCC)    Hypertension    Muscle weakness-general 01/05/2012   Current Outpatient Medications  Medication Sig Dispense Refill   amiodarone  (PACERONE ) 200 MG tablet TAKE 1 TABLET (200 MG TOTAL) BY MOUTH DAILY. NEEDS FOLLOW UP APPOINTMENT FOR MORE REFILLS 90 tablet 0   ammonium lactate (AMLACTIN) 12 % cream Apply 1 Application topically daily.  carvedilol  (COREG ) 3.125 MG tablet TAKE 1 TABLET BY MOUTH TWICE A DAY 180 tablet 0   cyclobenzaprine  (FLEXERIL ) 10 MG tablet Take 10 mg by mouth 3 (three) times daily as needed for muscle spasms.     digoxin  (LANOXIN ) 0.125 MG tablet TAKE 1 TABLET (125 MCG TOTAL) BY MOUTH DAILY. NEEDS FOLLOW UP APPOINTMENT FOR MORE REFILLS 30 tablet 0   docusate sodium  (COLACE) 100 MG capsule Take 2 capsules (200 mg total) by mouth 2 (two) times daily. 20 capsule 0   doxycycline  (VIBRA -TABS) 100 MG tablet Take 1 tablet (100 mg total) by mouth 2 (two) times daily. 14 tablet 0   empagliflozin  (JARDIANCE ) 10 MG TABS tablet Take 1 tablet (10 mg total) by mouth daily. 90 tablet 2   furosemide  (LASIX ) 40 MG tablet  TAKE 1 TABLET (40 MG TOTAL) BY MOUTH DAILY. NEEDS FOLLOW UP APPOINTMENT FOR FURTHER REFILLS 90 tablet 1   hydrALAZINE  (APRESOLINE ) 50 MG tablet Take 1 tablet (50 mg total) by mouth every 8 (eight) hours. 90 tablet 0   isosorbide  mononitrate (IMDUR ) 30 MG 24 hr tablet Take 1 tablet (30 mg total) by mouth daily. 30 tablet 0   metFORMIN (GLUCOPHAGE-XR) 500 MG 24 hr tablet Take 1,000 mg by mouth 2 (two) times daily.  1   oxyCODONE -acetaminophen  (PERCOCET) 7.5-325 MG tablet Take 1-2 tablets by mouth every 6 (six) hours as needed for severe pain. 20 tablet 0   polyethylene glycol powder (GLYCOLAX /MIRALAX ) 17 GM/SCOOP powder Take 1 capful (17 g) by mouth daily. 238 g 0   potassium chloride  SA (KLOR-CON  M) 20 MEQ tablet TAKE 1 TABLET (20 MEQ TOTAL) BY MOUTH DAILY. NEEDS FOLLOW UP APPOINTMENT FOR MORE REFILLS 30 tablet 1   rivaroxaban  (XARELTO ) 20 MG TABS tablet Take 1 tablet (20 mg total) by mouth daily.     rosuvastatin  (CRESTOR ) 20 MG tablet Take 20 mg by mouth daily.     Semaglutide, 2 MG/DOSE, (OZEMPIC, 2 MG/DOSE,) 8 MG/3ML SOPN Inject 2 mg into the skin once a week. Dose Increase     spironolactone  (ALDACTONE ) 25 MG tablet TAKE 1 TABLET (25 MG TOTAL) BY MOUTH DAILY. NEEDS FOLLOW UP APPOINTMENT FOR MORE REFILLS 90 tablet 1   sulfamethoxazole -trimethoprim  (BACTRIM  DS) 800-160 MG tablet Take 1 tablet by mouth 2 (two) times daily. 20 tablet 0   TRESIBA FLEXTOUCH 200 UNIT/ML FlexTouch Pen Inject 25 Units into the skin every evening.     DULoxetine  (CYMBALTA ) 20 MG capsule Take 1 capsule (20 mg total) by mouth daily. (Patient not taking: Reported on 11/15/2023) 30 capsule 0   gabapentin  (NEURONTIN ) 100 MG capsule Take 1 capsule (100 mg total) by mouth 2 (two) times daily. (Patient not taking: Reported on 11/15/2023) 60 capsule 0   sildenafil (REVATIO) 20 MG tablet Take 20 mg by mouth daily as needed (ED). (Patient not taking: Reported on 11/15/2023)     No current facility-administered medications for this  encounter.   Allergies  Allergen Reactions   Duloxetine  Other (See Comments)   Tizanidine     Other Reaction(s): Other (See Comments)  Severe nightmares    Social History   Socioeconomic History   Marital status: Single    Spouse name: Not on file   Number of children: Not on file   Years of education: Not on file   Highest education level: Not on file  Occupational History   Not on file  Tobacco Use   Smoking status: Former    Current packs/day: 0.00  Average packs/day: 0.5 packs/day for 32.0 years (16.0 ttl pk-yrs)    Types: Cigarettes    Start date: 05/19/1983    Quit date: 05/19/2015    Years since quitting: 8.4   Smokeless tobacco: Never  Vaping Use   Vaping status: Never Used  Substance and Sexual Activity   Alcohol  use: No   Drug use: No   Sexual activity: Not on file  Other Topics Concern   Not on file  Social History Narrative   Not on file   Social Drivers of Health   Financial Resource Strain: Low Risk  (09/27/2023)   Received from [facility]   Overall Financial Resource Strain (CARDIA)    Difficulty of Paying Living Expenses: Not hard at all  Food Insecurity: Low Risk  (10/02/2023)   Received from [facility]   Hunger Vital Sign    Within the past 12 months, you worried that your food would run out before you got money to buy more: Never true    Within the past 12 months, the food you bought just didn't last and you didn't have money to get more. : Never true  Transportation Needs: No Transportation Needs (10/02/2023)   Received from [facility]    In the past 12 months, has lack of reliable transportation kept you from medical appointments, meetings, work or from getting things needed for daily living? : No  Physical Activity: Not on file  Stress: Not on file  Social Connections: Unknown (06/15/2022)   Received from [facility]   Social Network    Social Network: Not on file  Intimate Partner Violence: Not  At Risk (01/22/2023)   Humiliation, Afraid, Rape, and Kick questionnaire    Fear of Current or Ex-Partner: No    Emotionally Abused: No    Physically Abused: No    Sexually Abused: No    History reviewed. No pertinent family history.  BP (!) 152/82   Pulse 82   Ht 6' (1.829 m)   Wt 107 kg (236 lb)   SpO2 96%   BMI 32.01 kg/m   Wt Readings from Last 3 Encounters:  11/15/23 107 kg (236 lb)  08/20/23 112.5 kg (248 lb)  01/26/23 110.5 kg (243 lb 9.7 oz)   Vitals:   11/15/23 1545  BP: (!) 152/82  Pulse: 82  SpO2: 96%   Physical Exam  ReDs: 26%, normal  GENERAL: fatigued appearing, NAD Lungs- CTAB  CARDIAC:  JVP: 7 cm          Normal rate with regular rhythm. No murmur. No LE edema.  ABDOMEN: Soft, non-tender, non-distended.  EXTREMITIES: Warm and well perfused.  NEUROLOGIC: No obvious FND   ASSESSMENT & PLAN:   1. Chronic Systolic Heart Failure - Echo 87/77 LVEF 25-30%, diffuse HK, RV mildly reduced. No prior study for comparison - Etiology uncertain. H/o chemotherapy w/ Fluoropyrimidines (potentially cardiotoxic) but this was remotely. Off chemo since 2003 - R/LHC 12/22 minimal CAD. Preserved CO. Low SVR - cMRI (12/22): LVEF 34%, RVEF 42%, mild MR, significantly dilated pulm artery 40 mm, AAA 45 mm - Echo 07/15/21 EF 30-35% mild MR  - Echo 12/30/21 EF 30-35% AscAo 4.2cm - NYHA III-IIIB, functional class confounded by physical deconditioning and chronic pain. Severity of symptoms also out of proportion to volume status. Euvolemic on exam and by ReDs 26%. Has h/o mod-severe MR but no appreciable murmur ausculted on examination today  - will update 2D Echo  - get back  on Entreto for afterload reduction, start 24-26 mg bid. Check BMP today  - if remains symptomatic despite improved afterload reduction, will need to consider RHC vs CPX next visit  - Continue carvedilol  3.125 mg bid. - Continue Jardiance  10 mg daily. - Continue spiro 25 mg daily. - Continue digoxin  0.125 mg  daily. Check Dig level  - Continue Imdur  30 mg daily  - He is not certain if he is taking hydralazine . Pt instructed to bring all meds to next f/u visit  - Continue Lasix  40 mg daily - discussed importance of good compliance and f/u in our clinic going forward     2. Mitral Regurgitation  - Mod-severe on echo 12/22. No significant v-waves in PCWP tracing on last cath. - Suspect functional in setting of severely dilated LV - Mild on cMRI 3/22. Mild on last echo 9/23 - no appreciable murmur heard on exam today, will update echo   3. Ascending aortic aneurysm - 4.5 cm on MRI - On echo 9/23 4.2 cm - he is following by CT surgery q6 months for surveillance, had recent visit 5/25   4. Type 2DM  - followed by PCP  - on Farxiga  + semaglutide    5. H/o Atrial Tach vs AFlutter  - EKG today shows NSR - on amiodarone  200 mg daily. Dyspnea out of proportion to volume status. Check ESR - check HFTs and TFTs   - CHA2DS2-VASc Score = 4  Continue Xarelto . He denies bleeding. Check CBC  6. Morbid obesity - needs weight loss - now on GLP1RA, semaglutide   7. Back pain - continue to follow with pain clinic  8. DVT with saddle PE 9/23 - reports compliance w/ Xarelto   RTC in 2 wks w/ APP to reassess response to up-titration of GDMT and f/u on echo results. Depending on symptoms, may need consideration for RHC and or CPX.     Caffie Shed, PA-C  4:03 PM 11/15/23

## 2023-11-16 ENCOUNTER — Ambulatory Visit (HOSPITAL_COMMUNITY): Payer: Self-pay | Admitting: Cardiology

## 2023-11-20 ENCOUNTER — Ambulatory Visit (HOSPITAL_COMMUNITY)
Admission: RE | Admit: 2023-11-20 | Discharge: 2023-11-20 | Disposition: A | Discharge status: Home / Self Care | Source: Ambulatory Visit | Attending: Cardiology | Admitting: Cardiology

## 2023-11-20 DIAGNOSIS — I080 Rheumatic disorders of both mitral and aortic valves: Secondary | ICD-10-CM | POA: Diagnosis not present

## 2023-11-20 DIAGNOSIS — I5042 Chronic combined systolic (congestive) and diastolic (congestive) heart failure: Secondary | ICD-10-CM | POA: Diagnosis present

## 2023-11-20 DIAGNOSIS — I110 Hypertensive heart disease with heart failure: Secondary | ICD-10-CM | POA: Diagnosis not present

## 2023-11-20 LAB — ECHOCARDIOGRAM COMPLETE
S' Lateral: 3.49 cm
Single Plane A4C EF: 45.1 %

## 2023-11-20 NOTE — Progress Notes (Signed)
  Echocardiogram 2D Echocardiogram has been performed.  Raymond Allen 11/20/2023, 9:49 AM

## 2023-11-21 NOTE — Progress Notes (Signed)
 Lmtcb

## 2023-11-22 NOTE — Telephone Encounter (Signed)
 Pts s/o returned call to triage via VM   Returned call [phone] No answer [facility]

## 2023-11-28 ENCOUNTER — Telehealth (HOSPITAL_COMMUNITY): Payer: Self-pay

## 2023-11-28 NOTE — Progress Notes (Signed)
 ADVANCED HF CLINIC NOTE  Primary Care: [place], Julie M, NP HF Cardiologist: Dr. Cherrie  HPI: Raymond Allen is a 63 y.o. AAM w/ remote h/o stage III colon cancer, diagnosed in October 2001, HTN, Type 2DM, HLD, former smoker, h/o LLE DVT on Xarelto  and systolic HF/ NICM.   He received initial adjuvant chemotherapy and underwent partial colectomy. He had mets to liver in October 2002. He underwent partial hepatectomy at [facility] in February 2003. He received neoadjuvant chemotherapy with FOLFIRI then adjuvant FOLFOX following the surgery. He was put on maintenance Xeloda. He has been off all chemotherapy since September of 2003.   Admitted 12/22 with new CHF and acute hypoxic respiratory failure, requiring intubation. Echo LVEF 25-30% w/ global HK, RV mildly reduced Mod-severe MR. R/LHC 12/22  minimal CAD. Etiology for CM unclear. Hospitalization c/b sepsis 2/2 PNA, atrial tachycardia with RVR, and AKI  Admitted 12/29/21 with leg swelling and SOB. Found to have acute left lower extremity DVT and saddle pulmonary embolus on CT imaging.  Xarelto  stopped and switched to Lovenox   Echo 12/30/21: EF 30-35% RV ok   Echo 07/15/21 EF 30-35% mild MR   Echo 9/23 EF 30-35%, RV nl. Trivial MR.   He returns today for heart failure follow up with wife. Overall feeling poor. NYHA IIIb. Reports that his biggest problem is a right hip pain related to severe osteoarthritis. He is currently walking with a cane, however the pain is limiting his mobility. Has seen [facility] orthopedic surgery on 10/01/23 and had corticosteroid injection that mildly relieved pain. Of note, has also has inguinal hernia that he reports is also painful in his hip area. He reports that he spoke to MD about arthroplasty which he would like to pursue, however ortho felt that he was high risk with DM, HF, and BP. Reports dyspnea and fatigue with exertion. Denies chest pain, palpitations, and dizziness.  Appetite okay. Compliant with all  medications.  Cardiac Studies - Echo (8/25): EF 45-50% gHK, normal RV, mild MR - Echo (9/23): EF 30-35% w RWMA, normal RV - Echo (12/22): EF 25-30% w/ gHK, RV mildly reduced , mildly elevated RVSP 38 mmH, Mod-sev MR.  - R/LHC (12/22): Prox RCA to Mid RCA lesion is 40% stenosed. Prox Cx to Mid Cx lesion is 50% stenosed.  Mid LAD lesion is 30% stenosed. RA 8, PA 29/12 (22), PCW 14, Fick CO/CI 6.5/2.7, PVR 1.2, PAPi 2.1 - CMR (12/22): LVEF 34%, RVEF 42%, mild MR, significantly dilated pulm artery 40 mm, AAA 45 mm  Past Medical History:  Diagnosis Date   Cancer [facility])    Colon cancer metastasized to liver (HCC) 01/05/2012   Colon carcinoma (HCC)    colon ca dx 2001;   Diabetes mellitus without complication (HCC)    Hypertension    Muscle weakness-general 01/05/2012   Current Outpatient Medications  Medication Sig Dispense Refill   amiodarone  (PACERONE ) 200 MG tablet TAKE 1 TABLET (200 MG TOTAL) BY MOUTH DAILY. NEEDS FOLLOW UP APPOINTMENT FOR MORE REFILLS 90 tablet 0   ammonium lactate (AMLACTIN) 12 % cream Apply 1 Application topically daily.     carvedilol  (COREG ) 3.125 MG tablet TAKE 1 TABLET BY MOUTH TWICE A DAY 180 tablet 0   cyclobenzaprine  (FLEXERIL ) 10 MG tablet Take 10 mg by mouth 3 (three) times daily as needed for muscle spasms.     digoxin  (LANOXIN ) 0.125 MG tablet TAKE 1 TABLET (125 MCG TOTAL) BY MOUTH DAILY. NEEDS FOLLOW UP APPOINTMENT FOR MORE REFILLS 30 tablet  0   doxycycline  (VIBRA -TABS) 100 MG tablet Take 1 tablet (100 mg total) by mouth 2 (two) times daily. 14 tablet 0   empagliflozin  (JARDIANCE ) 10 MG TABS tablet Take 1 tablet (10 mg total) by mouth daily. 90 tablet 2   furosemide  (LASIX ) 40 MG tablet TAKE 1 TABLET (40 MG TOTAL) BY MOUTH DAILY. NEEDS FOLLOW UP APPOINTMENT FOR FURTHER REFILLS 90 tablet 1   metFORMIN (GLUCOPHAGE-XR) 500 MG 24 hr tablet Take 1,000 mg by mouth 2 (two) times daily.  1   oxyCODONE -acetaminophen  (PERCOCET) 7.5-325 MG tablet Take 1-2 tablets by  mouth every 6 (six) hours as needed for severe pain. 20 tablet 0   potassium chloride  SA (KLOR-CON  M) 20 MEQ tablet TAKE 1 TABLET (20 MEQ TOTAL) BY MOUTH DAILY. NEEDS FOLLOW UP APPOINTMENT FOR MORE REFILLS 30 tablet 1   rosuvastatin  (CRESTOR ) 20 MG tablet Take 20 mg by mouth daily.     sacubitril -valsartan  (ENTRESTO ) 49-51 MG Take 1 tablet by mouth 2 (two) times daily. 90 tablet 1   Semaglutide, 2 MG/DOSE, (OZEMPIC, 2 MG/DOSE,) 8 MG/3ML SOPN Inject 2 mg into the skin once a week. Dose Increase     spironolactone  (ALDACTONE ) 25 MG tablet TAKE 1 TABLET (25 MG TOTAL) BY MOUTH DAILY. NEEDS FOLLOW UP APPOINTMENT FOR MORE REFILLS 90 tablet 1   TRESIBA FLEXTOUCH 200 UNIT/ML FlexTouch Pen Inject 25 Units into the skin every evening.     docusate sodium  (COLACE) 100 MG capsule Take 2 capsules (200 mg total) by mouth 2 (two) times daily. (Patient not taking: Reported on 11/29/2023) 20 capsule 0   DULoxetine  (CYMBALTA ) 20 MG capsule Take 1 capsule (20 mg total) by mouth daily. (Patient not taking: Reported on 11/29/2023) 30 capsule 0   gabapentin  (NEURONTIN ) 100 MG capsule Take 1 capsule (100 mg total) by mouth 2 (two) times daily. (Patient not taking: Reported on 11/29/2023) 60 capsule 0   polyethylene glycol powder (GLYCOLAX /MIRALAX ) 17 GM/SCOOP powder Take 1 capful (17 g) by mouth daily. (Patient not taking: Reported on 11/29/2023) 238 g 0   rivaroxaban  (XARELTO ) 20 MG TABS tablet Take 1 tablet (20 mg total) by mouth daily. (Patient not taking: Reported on 11/29/2023)     sildenafil (REVATIO) 20 MG tablet Take 20 mg by mouth daily as needed (ED). (Patient not taking: Reported on 11/29/2023)     sulfamethoxazole -trimethoprim  (BACTRIM  DS) 800-160 MG tablet Take 1 tablet by mouth 2 (two) times daily. (Patient not taking: Reported on 11/29/2023) 20 tablet 0   No current facility-administered medications for this encounter.   Allergies  Allergen Reactions   Duloxetine  Other (See Comments)   Tizanidine     Other  Reaction(s): Other (See Comments)  Severe nightmares    Social History   Socioeconomic History   Marital status: Single    Spouse name: Not on file   Number of children: Not on file   Years of education: Not on file   Highest education level: Not on file  Occupational History   Not on file  Tobacco Use   Smoking status: Former    Current packs/day: 0.00    Average packs/day: 0.5 packs/day for 32.0 years (16.0 ttl pk-yrs)    Types: Cigarettes    Start date: 05/19/1983    Quit date: 05/19/2015    Years since quitting: 8.5   Smokeless tobacco: Never  Vaping Use   Vaping status: Never Used  Substance and Sexual Activity   Alcohol  use: No   Drug use: No  Sexual activity: Not on file  Other Topics Concern   Not on file  Social History Narrative   Not on file   Social Drivers of Health   Financial Resource Strain: Low Risk  (09/27/2023)   Received from [facility]   Overall Financial Resource Strain (CARDIA)    Difficulty of Paying Living Expenses: Not hard at all  Food Insecurity: Low Risk  (10/02/2023)   Received from [facility]   Hunger Vital Sign    Within the past 12 months, you worried that your food would run out before you got money to buy more: Never true    Within the past 12 months, the food you bought just didn't last and you didn't have money to get more. : Never true  Transportation Needs: No Transportation Needs (10/02/2023)   Received from [facility]    In the past 12 months, has lack of reliable transportation kept you from medical appointments, meetings, work or from getting things needed for daily living? : No  Physical Activity: Not on file  Stress: Not on file  Social Connections: Unknown (06/15/2022)   Received from [facility]   Social Network    Social Network: Not on file  Intimate Partner Violence: Not At Risk (01/22/2023)   Humiliation, Afraid, Rape, and Kick questionnaire    Fear of Current or  Ex-Partner: No    Emotionally Abused: No    Physically Abused: No    Sexually Abused: No    History reviewed. No pertinent family history.  BP (!) 146/82   Pulse 96   Ht 6' (1.829 m)   Wt 106.4 kg (234 lb 9.6 oz)   SpO2 95%   BMI 31.82 kg/m   Wt Readings from Last 3 Encounters:  11/29/23 106.4 kg (234 lb 9.6 oz)  11/15/23 107 kg (236 lb)  08/20/23 112.5 kg (248 lb)   PHYSICAL EXAM: General: Well appearing. No distress on RA. Walks with cane Cardiac: JVP ~7. S1 and S2 present. No murmurs or rub. Extremities: Warm and dry.  No peripheral edema.  Neuro: Alert and oriented x3. Affect pleasant.   ASSESSMENT & PLAN:  1. Chronic Systolic Heart Failure - NICM by cath 2022, minimal CAD. EF 25-30%, gHK, RV mildly reduced.  - Etiology uncertain. H/o chemotherapy w/ Fluoropyrimidines (potentially cardiotoxic) but this was remotely. Off chemo since 2003 - EF with slow improvement. Up to 30-35% in 9/23 and now 45-50% on echo 8/25 - NYHA IIIb, confounded by physical deconditioning and chronic pain. Does report significant dyspnea with exertion. Appears out of proportion to volume/HF.  - Euvolemic on exam. Continue Lasix  40 mg daily - continue spiro 25 mg daily - continue jardiance  10 mg daily  - increase Entresto  to 49/51 mg bid - Continue carvedilol  3.125 mg bid. - Continue digoxin  0.125 mg daily.  - discussed importance of good compliance and f/u in our clinic going forward  - would benefit from CPX, however hip seems to be limiting factor. If unable to get CPX, may need RHC  2. Mitral Regurgitation  - Suspect functional in setting of severely dilated LV - mild on most recent echo 8/25 - no murmur on ascultation  3. Ascending aortic aneurysm - 4.5 cm on MRI - 4.1 on echo 8/25 - he is following by CT surgery q6 months for surveillance, had recent visit 5/25   4. Type 2DM  - Controlled. Last A1C 6.1. Last managed by PCP. - on Farxiga  +  semaglutide    5. Paroxysmal Atrial Tach  vs AFlutter  - continue amiodarone  200 mg daily. Regular rate on exam.  - CHA2DS2-VASc Score = 4. Continue Xarelto . He denies bleeding.   6. Morbid obesity - Body mass index is 31.82 kg/m. - On Semiglutide; losing weight  7. Back pain - continue to follow with pain clinic  8. DVT with saddle PE 9/23 - reports compliance w/ Xarelto   9. Severe Osteoarthritis  R hip pain - followed with [facility] - discussing hip arthroplasty - Pre-Operative CV Risk stratification: Patient is low/moderate CV risk for hip arthroplasty procedure, OK to proceed. RCRI score 1 pts (1.1% risk of cardiac event).   Follow up in 2 weeks with RN for BP check  Follow up in 2 month with APP   [place] Sarath Privott, NP  3:36 PM

## 2023-11-28 NOTE — Telephone Encounter (Signed)
 Called and spoke to pt's caregiver Lindora to confirm/remind patient of their appointment at the [facility] on 11/29/23.   Appointment:   [x] Confirmed  [] Left mess   [] No answer/No voice mail  [] VM Full/unable to leave message  [] Phone not in service  Patient reminded to bring all medications and/or complete list.  Confirmed patient has transportation. Gave directions, instructed to utilize valet parking.

## 2023-11-29 ENCOUNTER — Other Ambulatory Visit (HOSPITAL_COMMUNITY): Payer: Self-pay

## 2023-11-29 ENCOUNTER — Ambulatory Visit (HOSPITAL_COMMUNITY): Payer: Self-pay | Admitting: Cardiology

## 2023-11-29 ENCOUNTER — Encounter (HOSPITAL_COMMUNITY): Payer: Self-pay

## 2023-11-29 ENCOUNTER — Ambulatory Visit (HOSPITAL_COMMUNITY)
Admission: RE | Admit: 2023-11-29 | Discharge: 2023-11-29 | Disposition: A | Discharge status: Home / Self Care | Source: Ambulatory Visit | Attending: Cardiology | Admitting: Cardiology

## 2023-11-29 VITALS — BP 146/82 | HR 96 | Ht 72.0 in | Wt 234.6 lb

## 2023-11-29 DIAGNOSIS — M549 Dorsalgia, unspecified: Secondary | ICD-10-CM | POA: Diagnosis not present

## 2023-11-29 DIAGNOSIS — I428 Other cardiomyopathies: Secondary | ICD-10-CM | POA: Insufficient documentation

## 2023-11-29 DIAGNOSIS — M1611 Unilateral primary osteoarthritis, right hip: Secondary | ICD-10-CM | POA: Insufficient documentation

## 2023-11-29 DIAGNOSIS — I2510 Atherosclerotic heart disease of native coronary artery without angina pectoris: Secondary | ICD-10-CM | POA: Diagnosis not present

## 2023-11-29 DIAGNOSIS — I5042 Chronic combined systolic (congestive) and diastolic (congestive) heart failure: Secondary | ICD-10-CM

## 2023-11-29 DIAGNOSIS — Z87891 Personal history of nicotine dependence: Secondary | ICD-10-CM | POA: Insufficient documentation

## 2023-11-29 DIAGNOSIS — E785 Hyperlipidemia, unspecified: Secondary | ICD-10-CM | POA: Diagnosis not present

## 2023-11-29 DIAGNOSIS — I110 Hypertensive heart disease with heart failure: Secondary | ICD-10-CM | POA: Insufficient documentation

## 2023-11-29 DIAGNOSIS — I340 Nonrheumatic mitral (valve) insufficiency: Secondary | ICD-10-CM | POA: Insufficient documentation

## 2023-11-29 DIAGNOSIS — Z7901 Long term (current) use of anticoagulants: Secondary | ICD-10-CM | POA: Diagnosis not present

## 2023-11-29 DIAGNOSIS — K4090 Unilateral inguinal hernia, without obstruction or gangrene, not specified as recurrent: Secondary | ICD-10-CM | POA: Diagnosis not present

## 2023-11-29 DIAGNOSIS — Z7985 Long-term (current) use of injectable non-insulin antidiabetic drugs: Secondary | ICD-10-CM | POA: Diagnosis not present

## 2023-11-29 DIAGNOSIS — I7121 Aneurysm of the ascending aorta, without rupture: Secondary | ICD-10-CM | POA: Insufficient documentation

## 2023-11-29 DIAGNOSIS — E119 Type 2 diabetes mellitus without complications: Secondary | ICD-10-CM | POA: Diagnosis not present

## 2023-11-29 DIAGNOSIS — Z9221 Personal history of antineoplastic chemotherapy: Secondary | ICD-10-CM | POA: Diagnosis not present

## 2023-11-29 DIAGNOSIS — Z85038 Personal history of other malignant neoplasm of large intestine: Secondary | ICD-10-CM | POA: Insufficient documentation

## 2023-11-29 DIAGNOSIS — Z8505 Personal history of malignant neoplasm of liver: Secondary | ICD-10-CM | POA: Insufficient documentation

## 2023-11-29 DIAGNOSIS — Z8589 Personal history of malignant neoplasm of other organs and systems: Secondary | ICD-10-CM | POA: Insufficient documentation

## 2023-11-29 DIAGNOSIS — Z7984 Long term (current) use of oral hypoglycemic drugs: Secondary | ICD-10-CM | POA: Diagnosis not present

## 2023-11-29 DIAGNOSIS — Z794 Long term (current) use of insulin: Secondary | ICD-10-CM | POA: Insufficient documentation

## 2023-11-29 DIAGNOSIS — Z9049 Acquired absence of other specified parts of digestive tract: Secondary | ICD-10-CM | POA: Insufficient documentation

## 2023-11-29 DIAGNOSIS — G8929 Other chronic pain: Secondary | ICD-10-CM | POA: Insufficient documentation

## 2023-11-29 DIAGNOSIS — R0609 Other forms of dyspnea: Secondary | ICD-10-CM | POA: Insufficient documentation

## 2023-11-29 DIAGNOSIS — Z79899 Other long term (current) drug therapy: Secondary | ICD-10-CM | POA: Diagnosis not present

## 2023-11-29 DIAGNOSIS — Z6831 Body mass index (BMI) 31.0-31.9, adult: Secondary | ICD-10-CM | POA: Insufficient documentation

## 2023-11-29 DIAGNOSIS — I5022 Chronic systolic (congestive) heart failure: Secondary | ICD-10-CM | POA: Insufficient documentation

## 2023-11-29 DIAGNOSIS — E11628 Type 2 diabetes mellitus with other skin complications: Secondary | ICD-10-CM

## 2023-11-29 DIAGNOSIS — R5381 Other malaise: Secondary | ICD-10-CM | POA: Diagnosis not present

## 2023-11-29 DIAGNOSIS — Z86718 Personal history of other venous thrombosis and embolism: Secondary | ICD-10-CM | POA: Diagnosis not present

## 2023-11-29 LAB — BASIC METABOLIC PANEL WITH GFR
Anion gap: 12 (ref 5–15)
BUN: 19 mg/dL (ref 8–23)
CO2: 25 mmol/L (ref 22–32)
Calcium: 9.7 mg/dL (ref 8.9–10.3)
Chloride: 101 mmol/L (ref 98–111)
Creatinine, Ser: 1.64 mg/dL — ABNORMAL HIGH (ref 0.61–1.24)
GFR, Estimated: 46 mL/min — ABNORMAL LOW (ref >60)
Glucose, Bld: 86 mg/dL (ref 70–99)
Potassium: 4.6 mmol/L (ref 3.5–5.1)
Sodium: 138 mmol/L (ref 135–145)

## 2023-11-29 MED ORDER — SACUBITRIL-VALSARTAN 49-51 MG PO TABS: 1.0000 | ORAL_TABLET | Freq: Two times a day (BID) | ORAL | 1 refills | Status: DC

## 2023-11-29 NOTE — Patient Instructions (Addendum)
 Good to see you today!  INCREASE Entresto  to 49/51 mg Twice daily  Labs done today, your results will be available in MyChart, we will contact you for abnormal readings.   Repeat blood pressure check in 2 weeks as scheduled  Your physician recommends that you schedule a follow-up appointment  2 months with app clinic as scheduled   If you have any questions or concerns before your next appointment please send us  a message through [facility] or call our office at [phone].    TO LEAVE A MESSAGE FOR THE NURSE SELECT OPTION 2, PLEASE LEAVE A MESSAGE INCLUDING: YOUR NAME DATE OF BIRTH CALL BACK NUMBER REASON FOR CALL**this is important as we prioritize the call backs  YOU WILL RECEIVE A CALL BACK THE SAME DAY AS LONG AS YOU CALL BEFORE 4:00 PM At the [facility], you and your health needs are our priority. As part of our continuing mission to provide you with exceptional heart care, we have created designated Provider Care Teams. These Care Teams include your primary Cardiologist (physician) and Advanced Practice Providers (APPs- Physician Assistants and Nurse Practitioners) who all work together to provide you with the care you need, when you need it.   You may see any of the following providers on your designated Care Team at your next follow up: Dr Toribio Fuel Dr Ezra Shuck Dr. Ria Commander Dr. Morene Brownie Amy Lenetta, NP Caffie Shed, [place] [facility] [place], [place] Beckey Coe, NP [place] Lee, NP Ellouise Class, NP Tinnie Redman, PharmD Jaun Bash, PharmD   Please be sure to bring in all your medications bottles to every appointment.    Thank you for choosing [facility]

## 2023-12-06 ENCOUNTER — Ambulatory Visit: Admitting: Orthopedic Surgery

## 2023-12-06 DIAGNOSIS — S8012XA Contusion of left lower leg, initial encounter: Secondary | ICD-10-CM

## 2023-12-07 ENCOUNTER — Encounter: Payer: Self-pay | Admitting: Orthopedic Surgery

## 2023-12-07 NOTE — Progress Notes (Signed)
 Office Visit Note   Patient: Raymond Allen           Date of Birth: Jul 17, 1960           MRN: [id number] Visit Date: 12/06/2023              Requested by: [place], Julie M, NP No address on file PCP: [place], Julie M, NP  Chief Complaint  Patient presents with   Left Leg - Follow-up    01/2023 LLE debridement       HPI: Patient is a 63 year old gentleman who presents in follow-up for Kerecis tissue graft for left lower extremity  Assessment & Plan: Visit Diagnoses:  1. Hematoma of left lower leg     Plan: The wound is well-healed.  Patient will wear compression and follow-up as needed.  Follow-Up Instructions: Return if symptoms worsen or fail to improve.   Ortho Exam  Patient is alert, oriented, no adenopathy, well-dressed, normal affect, normal respiratory effort. Examination there is excellent healing of the wound.  The scab was removed there is no remaining ulcer there is good epithelialization.    Imaging: No results found.    Labs: Lab Results  Component Value Date   HGBA1C 6.1 (H) 01/22/2023   HGBA1C 7.4 (H) 12/30/2021   HGBA1C 7.7 (H) 03/29/2021   ESRSEDRATE 8 11/15/2023   CRP 4.8 (H) 01/26/2023   CRP 6.6 (H) 01/25/2023   CRP 3.2 (H) 01/24/2023   LABURIC 8.2 01/23/2023   REPTSTATUS 01/29/2023 FINAL 01/24/2023   GRAMSTAIN NO WBC SEEN NO ORGANISMS SEEN  01/24/2023   CULT  01/24/2023    RARE STAPHYLOCOCCUS SIMULANS RARE BACILLUS SPECIES Standardized susceptibility testing for this organism is not available. NO ANAEROBES ISOLATED Performed at [facility], [address], [place], [place] [address]    [facility] STAPHYLOCOCCUS SIMULANS 01/24/2023     Lab Results  Component Value Date   ALBUMIN 3.6 11/15/2023   ALBUMIN 3.0 (L) 01/26/2023   ALBUMIN 3.1 (L) 01/25/2023    Lab Results  Component Value Date   MG 2.2 01/26/2023   MG 2.2 01/25/2023   MG 2.0 01/24/2023   No results found for: VD25OH  No results found for:  PREALBUMIN    Latest Ref Rng & Units 11/15/2023    4:53 PM 01/26/2023    3:52 AM 01/25/2023    6:26 AM  CBC EXTENDED  WBC 4.0 - 10.5 K/uL 5.5  7.3  8.0   RBC 4.22 - 5.81 MIL/uL 6.17  4.86  5.04   Hemoglobin 13.0 - 17.0 g/dL 84.8  87.7  87.3   HCT 39.0 - 52.0 % 50.3  40.4  41.8   Platelets 150 - 400 K/uL 272  282  266   NEUT# 1.7 - 7.7 K/uL  4.5  6.1   Lymph# 0.7 - 4.0 K/uL  1.8  1.5      There is no height or weight on file to calculate BMI.  Orders:  No orders of the defined types were placed in this encounter.  No orders of the defined types were placed in this encounter.    Procedures: No procedures performed  Clinical Data: No additional findings.  ROS:  All other systems negative, except as noted in the HPI. Review of Systems  Objective: Vital Signs: There were no vitals taken for this visit.  Specialty Comments:  No specialty comments available.  PMFS History: Patient Active Problem List   Diagnosis Date Noted   AKI (acute kidney  injury) (HCC) 01/31/2023   Hematoma of left lower leg 01/22/2023   Laceration of left lower extremity 01/22/2023   Wound cellulitis 01/22/2023   Cellulitis 01/21/2023   Chronic combined systolic and diastolic CHF (congestive heart failure) (HCC) 12/30/2021   Atrial fibrillation, chronic (HCC) 12/30/2021   Mixed hyperlipidemia 12/30/2021   Obesity (BMI 30-39.9) 12/30/2021   Pulmonary embolism (HCC) 12/29/2021   Physical deconditioning 10/14/2021   Acute systolic heart failure (HCC) 04/05/2021   Pulmonary edema 04/05/2021   Acute metabolic encephalopathy 04/05/2021   CKD (chronic kidney disease) stage 3, GFR 30-59 ml/min (HCC) 04/05/2021   Atrial arrhythmia 04/05/2021   Respiratory distress 03/29/2021   Heart failure, type unknown (HCC)    Type 2 diabetes mellitus without complication (HCC)    Incisional hernia 11/27/2017   Colon cancer metastasized to liver (HCC) 01/05/2012   FLANK PAIN, RIGHT 01/21/2008   TOBACCO  ABUSE 12/26/2007   HYPERGLYCEMIA 12/26/2007   PERS HX NONCOMPLIANCE W/MED TX PRS HAZARDS HLTH 12/26/2007   CARCINOMA, COLON 12/17/2007   Essential hypertension 12/17/2007   Respiratory failure, acute (HCC) 12/17/2007   Past Medical History:  Diagnosis Date   Cancer [facility])    Colon cancer metastasized to liver (HCC) 01/05/2012   Colon carcinoma (HCC)    colon ca dx 2001;   Diabetes mellitus without complication (HCC)    Hypertension    Muscle weakness-general 01/05/2012    History reviewed. No pertinent family history.  Past Surgical History:  Procedure Laterality Date   APPLICATION OF WOUND VAC Left 01/24/2023   Procedure: APPLICATION OF WOUND VAC;  Surgeon: Harden Jerona GAILS, MD;  Location: [facility] OR;  Service: Orthopedics;  Laterality: Left;   CHOLECYSTECTOMY     COLON SURGERY     sigmoid colectomy, appendectomy   I & D EXTREMITY Left 01/24/2023   Procedure: LEFT LEG DEBRIDEMENT;  Surgeon: Harden Jerona GAILS, MD;  Location: [facility] OR;  Service: Orthopedics;  Laterality: Left;   INCISIONAL HERNIA REPAIR N/A 11/27/2017   Procedure: LAPAROSCOPIC ASSISTED INCISIONAL HERNIA;  Surgeon: Signe Mitzie LABOR, MD;  Location: [facility] ORS;  Service: General;  Laterality: N/A;   INSERTION OF MESH N/A 11/27/2017   Procedure: INSERTION OF MESH;  Surgeon: Signe Mitzie LABOR, MD;  Location: [facility] ORS;  Service: General;  Laterality: N/A;   LAPAROSCOPIC LYSIS OF ADHESIONS N/A 11/27/2017   Procedure: LAPAROSCOPIC LYSIS OF ADHESIONS;  Surgeon: Signe Mitzie LABOR, MD;  Location: [facility] ORS;  Service: General;  Laterality: N/A;   LIVER SURGERY     partial removal [facility] in 2003   PORT-A-CATH REMOVAL Right 07/16/2012   Procedure: REMOVAL PORT-A-CATH;  Surgeon: Sherlean JINNY Laughter, MD;  Location: [facility];  Service: General;  Laterality: Right;   RIGHT/LEFT HEART CATH AND CORONARY ANGIOGRAPHY N/A 04/01/2021   Procedure: RIGHT/LEFT HEART CATH AND CORONARY ANGIOGRAPHY;  Surgeon: Cherrie Toribio SAUNDERS, MD;  Location: [facility] INVASIVE CV  LAB;  Service: Cardiovascular;  Laterality: N/A;   Social History   Occupational History   Not on file  Tobacco Use   Smoking status: Former    Current packs/day: 0.00    Average packs/day: 0.5 packs/day for 32.0 years (16.0 ttl pk-yrs)    Types: Cigarettes    Start date: 05/19/1983    Quit date: 05/19/2015    Years since quitting: 8.5   Smokeless tobacco: Never  Vaping Use   Vaping status: Never Used  Substance and Sexual Activity   Alcohol  use: No   Drug use: No   Sexual activity:  Not on file

## 2023-12-13 ENCOUNTER — Encounter (HOSPITAL_COMMUNITY)

## 2023-12-14 ENCOUNTER — Other Ambulatory Visit (HOSPITAL_COMMUNITY): Payer: Self-pay | Admitting: Internal Medicine

## 2023-12-20 ENCOUNTER — Ambulatory Visit (HOSPITAL_COMMUNITY)
Admission: RE | Admit: 2023-12-20 | Discharge: 2023-12-20 | Disposition: A | Discharge status: Home / Self Care | Source: Ambulatory Visit | Attending: Cardiology | Admitting: Cardiology

## 2023-12-20 VITALS — BP 130/86 | HR 80 | Wt 242.1 lb

## 2023-12-20 DIAGNOSIS — I5042 Chronic combined systolic (congestive) and diastolic (congestive) heart failure: Secondary | ICD-10-CM

## 2023-12-20 DIAGNOSIS — Z0130 Encounter for examination of blood pressure without abnormal findings: Secondary | ICD-10-CM | POA: Diagnosis present

## 2023-12-20 NOTE — Progress Notes (Signed)
 Patient in for BP check, BP improved at 130/86, reports he did increase his Entresto  as ordered at last OV. Does not routinely check BP at home, reports compliance with meds. States he is feeling ok today, same as usual, denies SOB/HA/edema. Will continue taking meds as ordered

## 2023-12-20 NOTE — Patient Instructions (Signed)
 Continue current medications  Keep follow-up appointment as scheduled 01/29/24

## 2024-01-16 ENCOUNTER — Other Ambulatory Visit (HOSPITAL_COMMUNITY): Payer: Self-pay | Admitting: Internal Medicine

## 2024-01-21 ENCOUNTER — Other Ambulatory Visit: Payer: Self-pay

## 2024-01-23 ENCOUNTER — Other Ambulatory Visit: Payer: Self-pay | Admitting: Thoracic Surgery (Cardiothoracic Vascular Surgery)

## 2024-01-23 DIAGNOSIS — I7121 Aneurysm of the ascending aorta, without rupture: Secondary | ICD-10-CM

## 2024-01-23 NOTE — Progress Notes (Signed)
 ADVANCED HF CLINIC NOTE  Primary Care: [place], Julie M, NP HF Cardiologist: Dr. Cherrie  HPI: Raymond Allen is a 63 y.o. AAM w/ remote h/o stage III colon cancer, diagnosed in October 2001, HTN, Type 2DM, HLD, former smoker, h/o LLE DVT on Xarelto  and systolic HF/ NICM.   He received initial adjuvant chemotherapy and underwent partial colectomy. He had mets to liver in October 2002. He underwent partial hepatectomy at [facility] in February 2003. He received neoadjuvant chemotherapy with FOLFIRI then adjuvant FOLFOX following the surgery. He was put on maintenance Xeloda. He has been off all chemotherapy since September of 2003.   Admitted 12/22 with new CHF and acute hypoxic respiratory failure, requiring intubation. Echo LVEF 25-30% w/ global HK, RV mildly reduced Mod-severe MR. R/LHC 12/22  minimal CAD. Etiology for CM unclear. Hospitalization c/b sepsis 2/2 PNA, atrial tachycardia with RVR, and AKI  Admitted 12/29/21 with leg swelling and SOB. Found to have acute left lower extremity DVT and saddle pulmonary embolus on CT imaging.  Xarelto  stopped and switched to Lovenox   Echo 12/30/21: EF 30-35% RV ok   Echo 07/15/21 EF 30-35% mild MR   Echo 9/23 EF 30-35%, RV nl. Trivial MR.   Echo 8/25 showed EF 45-50%, RV normal.  Today he returns for HF follow up with his wife. Main complaint is hip OA, he follows with ortho and receives steroid injections. He has SOB walking further distances on flat ground. Hip OA limits him physically. Hands swell sometimes. Rare palpitations and occasional dizziness, no falls or syncope. Denies abnormal bleeding, CP, or PND/Orthopnea. Appetite ok. Weight at home 246 pounds. Taking all medications. BP at home 138/87.  Cardiac Studies - Echo (8/25): EF 45-50% gHK, normal RV, mild MR - Echo (9/23): EF 30-35% w RWMA, normal RV - Echo (12/22): EF 25-30% w/ gHK, RV mildly reduced , mildly elevated RVSP 38 mmH, Mod-sev MR.  - R/LHC (12/22): Prox RCA to Mid RCA lesion  is 40% stenosed. Prox Cx to Mid Cx lesion is 50% stenosed.  Mid LAD lesion is 30% stenosed. RA 8, PA 29/12 (22), PCW 14, Fick CO/CI 6.5/2.7, PVR 1.2, PAPi 2.1 - CMR (12/22): LVEF 34%, RVEF 42%, mild MR, significantly dilated pulm artery 40 mm, AAA 45 mm  Past Medical History:  Diagnosis Date   Cancer [facility])    Colon cancer metastasized to liver (HCC) 01/05/2012   Colon carcinoma (HCC)    colon ca dx 2001;   Diabetes mellitus without complication (HCC)    Hypertension    Muscle weakness-general 01/05/2012   Current Outpatient Medications  Medication Sig Dispense Refill   amiodarone  (PACERONE ) 200 MG tablet TAKE 1 TABLET (200 MG TOTAL) BY MOUTH DAILY. NEED FOLLOW UP APPOINTMENT FOR MORE REFILLS 90 tablet 0   ammonium lactate (AMLACTIN) 12 % cream Apply 1 Application topically daily.     carvedilol  (COREG ) 3.125 MG tablet TAKE 1 TABLET BY MOUTH TWICE A DAY 180 tablet 0   cyclobenzaprine  (FLEXERIL ) 10 MG tablet Take 10 mg by mouth 3 (three) times daily as needed for muscle spasms.     digoxin  (LANOXIN ) 0.125 MG tablet TAKE 1 TABLET (125 MCG TOTAL) BY MOUTH DAILY. NEEDS FOLLOW UP APPOINTMENT FOR MORE REFILLS 30 tablet 0   docusate sodium  (COLACE) 100 MG capsule Take 2 capsules (200 mg total) by mouth 2 (two) times daily. 20 capsule 0   DULoxetine  (CYMBALTA ) 20 MG capsule Take 1 capsule (20 mg total) by mouth daily. 30 capsule 0  empagliflozin  (JARDIANCE ) 10 MG TABS tablet Take 1 tablet (10 mg total) by mouth daily. 90 tablet 2   furosemide  (LASIX ) 40 MG tablet TAKE 1 TABLET (40 MG TOTAL) BY MOUTH DAILY. NEEDS FOLLOW UP APPOINTMENT FOR FURTHER REFILLS 90 tablet 1   gabapentin  (NEURONTIN ) 100 MG capsule Take 1 capsule (100 mg total) by mouth 2 (two) times daily. 60 capsule 0   metFORMIN (GLUCOPHAGE-XR) 500 MG 24 hr tablet Take 1,000 mg by mouth 2 (two) times daily.  1   oxyCODONE -acetaminophen  (PERCOCET) 7.5-325 MG tablet Take 1-2 tablets by mouth every 6 (six) hours as needed for severe pain. 20  tablet 0   polyethylene glycol powder (GLYCOLAX /MIRALAX ) 17 GM/SCOOP powder Take 1 capful (17 g) by mouth daily. 238 g 0   potassium chloride  SA (KLOR-CON  M) 20 MEQ tablet TAKE 1 TABLET (20 MEQ TOTAL) BY MOUTH DAILY. NEEDS FOLLOW UP APPOINTMENT FOR MORE REFILLS 30 tablet 1   rivaroxaban  (XARELTO ) 20 MG TABS tablet Take 1 tablet (20 mg total) by mouth daily.     rosuvastatin  (CRESTOR ) 20 MG tablet Take 20 mg by mouth daily.     sacubitril -valsartan  (ENTRESTO ) 49-51 MG Take 1 tablet by mouth 2 (two) times daily. 90 tablet 1   Semaglutide, 2 MG/DOSE, (OZEMPIC, 2 MG/DOSE,) 8 MG/3ML SOPN Inject 2 mg into the skin once a week. Dose Increase     sildenafil (REVATIO) 20 MG tablet Take 20 mg by mouth daily as needed (ED).     spironolactone  (ALDACTONE ) 25 MG tablet TAKE 1 TABLET (25 MG TOTAL) BY MOUTH DAILY. NEEDS FOLLOW UP APPOINTMENT FOR MORE REFILLS 90 tablet 1   TRESIBA FLEXTOUCH 200 UNIT/ML FlexTouch Pen Inject 25 Units into the skin every evening.     No current facility-administered medications for this encounter.   Allergies  Allergen Reactions   Duloxetine  Other (See Comments)   Tizanidine     Other Reaction(s): Other (See Comments)  Severe nightmares   Social History   Socioeconomic History   Marital status: Single    Spouse name: Not on file   Number of children: Not on file   Years of education: Not on file   Highest education level: Not on file  Occupational History   Not on file  Tobacco Use   Smoking status: Former    Current packs/day: 0.00    Average packs/day: 0.5 packs/day for 32.0 years (16.0 ttl pk-yrs)    Types: Cigarettes    Start date: 05/19/1983    Quit date: 05/19/2015    Years since quitting: 8.7   Smokeless tobacco: Never  Vaping Use   Vaping status: Never Used  Substance and Sexual Activity   Alcohol  use: No   Drug use: No   Sexual activity: Not on file  Other Topics Concern   Not on file  Social History Narrative   Not on file   Social Drivers of  Health   Financial Resource Strain: Low Risk  (09/27/2023)   Received from [facility]   Overall Financial Resource Strain (CARDIA)    Difficulty of Paying Living Expenses: Not hard at all  Food Insecurity: Low Risk  (10/02/2023)   Received from [facility]   Hunger Vital Sign    Within the past 12 months, you worried that your food would run out before you got money to buy more: Never true    Within the past 12 months, the food you bought just didn't last and you didn't have money to get  more. : Never true  Transportation Needs: No Transportation Needs (10/02/2023)   Received from [facility]    In the past 12 months, has lack of reliable transportation kept you from medical appointments, meetings, work or from getting things needed for daily living? : No  Physical Activity: Not on file  Stress: Not on file  Social Connections: Unknown (06/15/2022)   Received from [facility]   Social Network    Social Network: Not on file  Intimate Partner Violence: Not At Risk (01/22/2023)   Humiliation, Afraid, Rape, and Kick questionnaire    Fear of Current or Ex-Partner: No    Emotionally Abused: No    Physically Abused: No    Sexually Abused: No    No family history on file.  BP (!) 146/94   Pulse 88   Ht 6' (1.829 m)   Wt 109.7 kg (241 lb 12.8 oz)   SpO2 96%   BMI 32.79 kg/m   Wt Readings from Last 3 Encounters:  01/29/24 109.7 kg (241 lb 12.8 oz)  12/20/23 109.8 kg (242 lb 2 oz)  11/29/23 106.4 kg (234 lb 9.6 oz)   PHYSICAL EXAM: General:  NAD. No resp difficulty, walked into clinic HEENT: Normal Neck: Supple. No JVD. Cor: Regular rate & rhythm. No rubs, gallops or murmurs. Lungs: Clear Abdomen: Soft, nontender, nondistended.  Extremities: No cyanosis, clubbing, rash, edema Neuro: Alert & oriented x 3, moves all 4 extremities w/o difficulty. Affect pleasant.  ReDs reading: 25 %, normal  ASSESSMENT & PLAN:  1. Chronic Systolic Heart  Failure - NICM by cath 2022, minimal CAD. EF 25-30%, gHK, RV mildly reduced.  - Etiology uncertain. H/o chemotherapy w/ Fluoropyrimidines (potentially cardiotoxic) but this was remotely. Off chemo since 2003 - EF with slow improvement.  - Echo 9/23: EF 30-35% - Echo 8/25: EF 45-50%  - NYHA II-early III, confounded by physical deconditioning and chronic pain. Volume OK, ReDs 25% - With improved EF, stop digoxin . - Increase Entresto  to 97/103 mg bid - Continue Lasix  40 mg daily. - Continue spironolactone  25 mg daily. - Continue Jardiance  10 mg daily. - Continue carvedilol  3.125 mg bid. - Labs today, repeat BMET in 2 weeks  2. Mitral Regurgitation  - Suspect functional in setting of severely dilated LV - mild on most recent echo 8/25 - Follow  3. Ascending aortic aneurysm - 4.5 cm on MRI - 4.1 on echo 8/25 - he is following by CT surgery q 6 months for surveillance    4. Type 2DM  - Controlled.  - Managed by PCP.   5. Paroxysmal Atrial Tach vs AFlutter  - CHA2DS2-VASc Score = 4.  - Regular on exam - Continue amiodarone  200 mg daily. - Continue Xarelto . He denies bleeding.  - Amio labs UTD  6. Morbid obesity - Body mass index is 32.79 kg/m. - On Semiglutide; losing weight  7. Back pain - Follow with pain clinic  8. DVT with saddle PE 9/23 - On Xarelto  20 mg daily  Follow up in 6 months with Dr. Bensimhon   Alecia Doi M Simcha Speir, FNP  4:00 PM

## 2024-01-28 ENCOUNTER — Telehealth (HOSPITAL_COMMUNITY): Payer: Self-pay

## 2024-01-28 NOTE — Telephone Encounter (Signed)
 Called to confirm/remind patient of their appointment at the [facility] on 01/29/24.   Appointment:   [] Confirmed  [x] Left mess   [] No answer/No voice mail  [] VM Full/unable to leave message  [] Phone not in service  And  to bring in all medications and/or complete list.

## 2024-01-29 ENCOUNTER — Encounter (HOSPITAL_COMMUNITY): Payer: Self-pay

## 2024-01-29 ENCOUNTER — Ambulatory Visit (HOSPITAL_COMMUNITY)
Admission: RE | Admit: 2024-01-29 | Discharge: 2024-01-29 | Disposition: A | Discharge status: Home / Self Care | Source: Ambulatory Visit | Attending: Family Medicine | Admitting: Family Medicine

## 2024-01-29 VITALS — BP 146/94 | HR 88 | Ht 72.0 in | Wt 241.8 lb

## 2024-01-29 DIAGNOSIS — I110 Hypertensive heart disease with heart failure: Secondary | ICD-10-CM | POA: Diagnosis not present

## 2024-01-29 DIAGNOSIS — I2510 Atherosclerotic heart disease of native coronary artery without angina pectoris: Secondary | ICD-10-CM | POA: Insufficient documentation

## 2024-01-29 DIAGNOSIS — Z86711 Personal history of pulmonary embolism: Secondary | ICD-10-CM | POA: Insufficient documentation

## 2024-01-29 DIAGNOSIS — I7121 Aneurysm of the ascending aorta, without rupture: Secondary | ICD-10-CM | POA: Diagnosis not present

## 2024-01-29 DIAGNOSIS — Z86718 Personal history of other venous thrombosis and embolism: Secondary | ICD-10-CM | POA: Diagnosis not present

## 2024-01-29 DIAGNOSIS — E669 Obesity, unspecified: Secondary | ICD-10-CM

## 2024-01-29 DIAGNOSIS — I340 Nonrheumatic mitral (valve) insufficiency: Secondary | ICD-10-CM | POA: Insufficient documentation

## 2024-01-29 DIAGNOSIS — I5042 Chronic combined systolic (congestive) and diastolic (congestive) heart failure: Secondary | ICD-10-CM

## 2024-01-29 DIAGNOSIS — Z7985 Long-term (current) use of injectable non-insulin antidiabetic drugs: Secondary | ICD-10-CM | POA: Diagnosis not present

## 2024-01-29 DIAGNOSIS — Z6832 Body mass index (BMI) 32.0-32.9, adult: Secondary | ICD-10-CM | POA: Insufficient documentation

## 2024-01-29 DIAGNOSIS — E11628 Type 2 diabetes mellitus with other skin complications: Secondary | ICD-10-CM | POA: Diagnosis not present

## 2024-01-29 DIAGNOSIS — Z79899 Other long term (current) drug therapy: Secondary | ICD-10-CM | POA: Diagnosis not present

## 2024-01-29 DIAGNOSIS — Z7901 Long term (current) use of anticoagulants: Secondary | ICD-10-CM | POA: Insufficient documentation

## 2024-01-29 DIAGNOSIS — Z87891 Personal history of nicotine dependence: Secondary | ICD-10-CM | POA: Diagnosis not present

## 2024-01-29 DIAGNOSIS — I5022 Chronic systolic (congestive) heart failure: Secondary | ICD-10-CM | POA: Diagnosis present

## 2024-01-29 DIAGNOSIS — E785 Hyperlipidemia, unspecified: Secondary | ICD-10-CM | POA: Diagnosis not present

## 2024-01-29 DIAGNOSIS — M549 Dorsalgia, unspecified: Secondary | ICD-10-CM | POA: Insufficient documentation

## 2024-01-29 DIAGNOSIS — I428 Other cardiomyopathies: Secondary | ICD-10-CM | POA: Insufficient documentation

## 2024-01-29 DIAGNOSIS — Z7984 Long term (current) use of oral hypoglycemic drugs: Secondary | ICD-10-CM | POA: Diagnosis not present

## 2024-01-29 DIAGNOSIS — M1610 Unilateral primary osteoarthritis, unspecified hip: Secondary | ICD-10-CM | POA: Insufficient documentation

## 2024-01-29 DIAGNOSIS — E119 Type 2 diabetes mellitus without complications: Secondary | ICD-10-CM | POA: Diagnosis not present

## 2024-01-29 DIAGNOSIS — Z794 Long term (current) use of insulin: Secondary | ICD-10-CM | POA: Diagnosis not present

## 2024-01-29 DIAGNOSIS — Z9221 Personal history of antineoplastic chemotherapy: Secondary | ICD-10-CM | POA: Insufficient documentation

## 2024-01-29 DIAGNOSIS — I4892 Unspecified atrial flutter: Secondary | ICD-10-CM

## 2024-01-29 LAB — BASIC METABOLIC PANEL WITH GFR
Anion gap: 7 (ref 5–15)
BUN: 18 mg/dL (ref 8–23)
CO2: 27 mmol/L (ref 22–32)
Calcium: 8.9 mg/dL (ref 8.9–10.3)
Chloride: 105 mmol/L (ref 98–111)
Creatinine, Ser: 1.39 mg/dL — ABNORMAL HIGH (ref 0.61–1.24)
GFR, Estimated: 56 mL/min — ABNORMAL LOW (ref >60)
Glucose, Bld: 78 mg/dL (ref 70–99)
Potassium: 4.1 mmol/L (ref 3.5–5.1)
Sodium: 139 mmol/L (ref 135–145)

## 2024-01-29 MED ORDER — SACUBITRIL-VALSARTAN 97-103 MG PO TABS: 1.0000 | ORAL_TABLET | Freq: Two times a day (BID) | ORAL | 3 refills | Status: DC

## 2024-01-29 NOTE — Patient Instructions (Signed)
 Medication Changes:  INCREASE ENTRESTO  TO 97/103MG  TWICE DAILY   STOP DIGOXIN    Lab Work:  Labs done today, your results will be available in MyChart, we will contact you for abnormal readings.  AND THEN LABS IN 10 DAYS AS SCHEDULED   Special Instructions // Education:  PLEASE CALL THE CLINIC AT [phone] OPT 2 IF SYSTOLIC (TOP NUMBER) BLOOD PRESSURE IS GREATER THAN 140  Follow-Up in: 6 MONTHS WITH PLEASE CALL OUR OFFICE AROUND MARCH  TO GET SCHEDULED FOR YOUR APPOINTMENT. PHONE NUMBER IS [phone] OPTION 2   At the [facility], you and your health needs are our priority. We have a designated team specialized in the treatment of Heart Failure. This Care Team includes your primary Heart Failure Specialized Cardiologist (physician), Advanced Practice Providers (APPs- Physician Assistants and Nurse Practitioners), and Pharmacist who all work together to provide you with the care you need, when you need it.   You may see any of the following providers on your designated Care Team at your next follow up:  Dr. Toribio Fuel Dr. Ezra Shuck Dr. Ria Commander Dr. Odis Brownie Greig Mosses, NP Caffie Shed, [place] [facility] [place], [place] Beckey Coe, NP [place] Lee, NP Tinnie Redman, PharmD   Please be sure to bring in all your medications bottles to every appointment.   Need to Contact Us :  If you have any questions or concerns before your next appointment please send us  a message through [place] or call our office at [phone].    TO LEAVE A MESSAGE FOR THE NURSE SELECT OPTION 2, PLEASE LEAVE A MESSAGE INCLUDING: YOUR NAME DATE OF BIRTH CALL BACK NUMBER REASON FOR CALL**this is important as we prioritize the call backs  YOU WILL RECEIVE A CALL BACK THE SAME DAY AS LONG AS YOU CALL BEFORE 4:00 PM

## 2024-01-30 ENCOUNTER — Ambulatory Visit (HOSPITAL_COMMUNITY): Payer: Self-pay | Admitting: Family Medicine

## 2024-01-30 ENCOUNTER — Other Ambulatory Visit (HOSPITAL_COMMUNITY)

## 2024-01-30 DIAGNOSIS — N1831 Chronic kidney disease, stage 3a: Secondary | ICD-10-CM

## 2024-01-30 DIAGNOSIS — I5042 Chronic combined systolic (congestive) and diastolic (congestive) heart failure: Secondary | ICD-10-CM

## 2024-01-31 ENCOUNTER — Ambulatory Visit (HOSPITAL_COMMUNITY)
Admission: RE | Admit: 2024-01-31 | Discharge: 2024-01-31 | Disposition: A | Discharge status: Home / Self Care | Source: Ambulatory Visit | Attending: Thoracic Surgery (Cardiothoracic Vascular Surgery) | Admitting: Thoracic Surgery (Cardiothoracic Vascular Surgery)

## 2024-01-31 DIAGNOSIS — I7121 Aneurysm of the ascending aorta, without rupture: Secondary | ICD-10-CM | POA: Diagnosis present

## 2024-01-31 MED ORDER — IOHEXOL 350 MG/ML SOLN
75.0000 mL | Freq: Once | INTRAVENOUS | Status: AC | PRN
  Administered 2024-01-31: 75 mL via INTRAVENOUS

## 2024-02-04 NOTE — Addendum Note (Signed)
 Encounter addended by: Elinda Rolin SAILOR, [place] on: 02/04/2024 2:28 PM  Actions taken: Charge Capture section accepted

## 2024-02-08 ENCOUNTER — Ambulatory Visit (HOSPITAL_COMMUNITY)
Admission: RE | Admit: 2024-02-08 | Discharge: 2024-02-08 | Disposition: A | Discharge status: Home / Self Care | Source: Ambulatory Visit | Attending: Cardiology | Admitting: Cardiology

## 2024-02-08 DIAGNOSIS — I5042 Chronic combined systolic (congestive) and diastolic (congestive) heart failure: Secondary | ICD-10-CM | POA: Insufficient documentation

## 2024-02-08 LAB — BASIC METABOLIC PANEL WITH GFR
Anion gap: 10 (ref 5–15)
BUN: 19 mg/dL (ref 8–23)
CO2: 28 mmol/L (ref 22–32)
Calcium: 9.4 mg/dL (ref 8.9–10.3)
Chloride: 101 mmol/L (ref 98–111)
Creatinine, Ser: 1.87 mg/dL — ABNORMAL HIGH (ref 0.61–1.24)
GFR, Estimated: 39 mL/min — ABNORMAL LOW (ref >60)
Glucose, Bld: 94 mg/dL (ref 70–99)
Potassium: 4.6 mmol/L (ref 3.5–5.1)
Sodium: 139 mmol/L (ref 135–145)

## 2024-02-08 MED ORDER — SACUBITRIL-VALSARTAN 49-51 MG PO TABS: 1.0000 | ORAL_TABLET | Freq: Two times a day (BID) | ORAL | Status: AC

## 2024-02-18 ENCOUNTER — Encounter: Payer: Self-pay | Admitting: Radiology

## 2024-02-19 ENCOUNTER — Ambulatory Visit (HOSPITAL_COMMUNITY)
Admission: RE | Admit: 2024-02-19 | Discharge: 2024-02-19 | Disposition: A | Discharge status: Home / Self Care | Source: Ambulatory Visit | Attending: Cardiology | Admitting: Cardiology

## 2024-02-19 ENCOUNTER — Ambulatory Visit (HOSPITAL_COMMUNITY): Payer: Self-pay | Admitting: Family Medicine

## 2024-02-19 DIAGNOSIS — I5042 Chronic combined systolic (congestive) and diastolic (congestive) heart failure: Secondary | ICD-10-CM | POA: Diagnosis present

## 2024-02-19 DIAGNOSIS — N1831 Chronic kidney disease, stage 3a: Secondary | ICD-10-CM | POA: Insufficient documentation

## 2024-02-19 LAB — BASIC METABOLIC PANEL WITH GFR
Anion gap: 8 (ref 5–15)
BUN: 14 mg/dL (ref 8–23)
CO2: 28 mmol/L (ref 22–32)
Calcium: 9.4 mg/dL (ref 8.9–10.3)
Chloride: 103 mmol/L (ref 98–111)
Creatinine, Ser: 1.35 mg/dL — ABNORMAL HIGH (ref 0.61–1.24)
GFR, Estimated: 58 mL/min — ABNORMAL LOW (ref >60)
Glucose, Bld: 53 mg/dL — ABNORMAL LOW (ref 70–99)
Potassium: 4.3 mmol/L (ref 3.5–5.1)
Sodium: 139 mmol/L (ref 135–145)

## 2024-02-26 ENCOUNTER — Ambulatory Visit

## 2024-02-28 ENCOUNTER — Ambulatory Visit

## 2024-02-28 VITALS — BP 123/76 | HR 82 | Resp 20 | Ht 72.0 in | Wt 244.0 lb

## 2024-02-28 DIAGNOSIS — I7121 Aneurysm of the ascending aorta, without rupture: Secondary | ICD-10-CM | POA: Diagnosis not present

## 2024-02-28 NOTE — Progress Notes (Signed)
 [name] [address]             [id number]            Raymond Allen [id number] February 16, 1961   History of Present Illness:   Raymond Allen is a 63 year old man with medical history of type 2 diabetes mellitus, diabetic neuropathy, chronic pain syndrome, spondylosis of lumbar region, congestive heart failure with nonischemic cardiomyopathy, hypertension, dyslipidemia, atrial flutter, former tobacco user, right hip osteoarthritis, CKD, and colon cancer status post sigmoid colectomy. He presents to the clinic today for continued follow up of ascending thoracic aortic aneurysm.  This has stayed stable in size and measured 4.5 cm on recent CTA of chest.   He presents to the clinic and reports that he is doing ok.  He has been experiencing right hip pain and walking has become difficult.  He is currently followed by an orthopedist at [facility] and has undergone steroid injections into the joint.  This has brought some temporary relief.  He is also followed by [facility] for his chronic pain.  His blood pressure is controlled with current medication therapy.  He tries to stay active as much as he can.  He denies chest pain and shortness of breath.    Current Outpatient Medications on File Prior to Visit  Medication Sig Dispense Refill   amiodarone  (PACERONE ) 200 MG tablet TAKE 1 TABLET (200 MG TOTAL) BY MOUTH DAILY. NEED FOLLOW UP APPOINTMENT FOR MORE REFILLS 90 tablet 0   ammonium lactate (AMLACTIN) 12 % cream Apply 1 Application topically daily.     carvedilol  (COREG ) 3.125 MG tablet TAKE 1 TABLET BY MOUTH TWICE A DAY 180 tablet 0   cyclobenzaprine  (FLEXERIL ) 10 MG tablet Take 10 mg by mouth 3 (three) times daily as needed for muscle spasms.     docusate sodium  (COLACE) 100 MG capsule Take 2 capsules (200 mg total) by mouth 2 (two) times daily. 20 capsule 0   DULoxetine  (CYMBALTA ) 20 MG capsule Take 1 capsule (20 mg total) by mouth  daily. 30 capsule 0   empagliflozin  (JARDIANCE ) 10 MG TABS tablet Take 1 tablet (10 mg total) by mouth daily. 90 tablet 2   furosemide  (LASIX ) 40 MG tablet TAKE 1 TABLET (40 MG TOTAL) BY MOUTH DAILY. NEEDS FOLLOW UP APPOINTMENT FOR FURTHER REFILLS 90 tablet 1   gabapentin  (NEURONTIN ) 100 MG capsule Take 1 capsule (100 mg total) by mouth 2 (two) times daily. 60 capsule 0   metFORMIN (GLUCOPHAGE-XR) 500 MG 24 hr tablet Take 1,000 mg by mouth 2 (two) times daily.  1   oxyCODONE -acetaminophen  (PERCOCET) 7.5-325 MG tablet Take 1-2 tablets by mouth every 6 (six) hours as needed for severe pain. 20 tablet 0   polyethylene glycol powder (GLYCOLAX /MIRALAX ) 17 GM/SCOOP powder Take 1 capful (17 g) by mouth daily. 238 g 0   potassium chloride  SA (KLOR-CON  M) 20 MEQ tablet TAKE 1 TABLET (20 MEQ TOTAL) BY MOUTH DAILY. NEEDS FOLLOW UP APPOINTMENT FOR MORE REFILLS 30 tablet 1   rivaroxaban  (XARELTO ) 20 MG TABS tablet Take 1 tablet (20 mg total) by mouth daily.     rosuvastatin  (CRESTOR ) 20 MG tablet Take 20 mg by mouth daily.     sacubitril -valsartan  (ENTRESTO ) 49-51 MG Take 1 tablet by mouth 2 (two) times daily.     Semaglutide, 2 MG/DOSE, (OZEMPIC, 2 MG/DOSE,) 8 MG/3ML SOPN Inject 2 mg into the  skin once a week. Dose Increase     sildenafil (REVATIO) 20 MG tablet Take 20 mg by mouth daily as needed (ED).     spironolactone  (ALDACTONE ) 25 MG tablet TAKE 1 TABLET (25 MG TOTAL) BY MOUTH DAILY. NEEDS FOLLOW UP APPOINTMENT FOR MORE REFILLS 90 tablet 1   TRESIBA FLEXTOUCH 200 UNIT/ML FlexTouch Pen Inject 25 Units into the skin every evening.     No current facility-administered medications on file prior to visit.     ROS:  Review of Systems  Respiratory:  Negative for cough and shortness of breath.   Cardiovascular:  Negative for chest pain.  Musculoskeletal:  Positive for back pain and joint pain.     BP 123/76   Pulse 82   Resp 20   Ht 6' (1.829 m)   Wt 244 lb (110.7 kg)   SpO2 95% Comment: RA  BMI  33.09 kg/m   Physical Exam Constitutional:      Appearance: Normal appearance.  HENT:     Head: Normocephalic and atraumatic.  Skin:    General: Skin is warm and dry.  Neurological:     General: No focal deficit present.     Mental Status: He is alert and oriented to person, place, and time.      Imaging: CLINICAL DATA:  Thoracic aortic aneurysm.   EXAM: CT ANGIOGRAPHY CHEST WITH CONTRAST   TECHNIQUE: Multidetector CT imaging of the chest was performed using the standard protocol during bolus administration of intravenous contrast. Multiplanar CT image reconstructions and MIPs were obtained to evaluate the vascular anatomy.   RADIATION DOSE REDUCTION: This exam was performed according to the departmental dose-optimization program which includes automated exposure control, adjustment of the mA and/or kV according to patient size and/or use of iterative reconstruction technique.   CONTRAST:  75mL OMNIPAQUE  IOHEXOL  350 MG/ML SOLN   COMPARISON:  CT dated 07/12/2023.   FINDINGS: Cardiovascular: There is no cardiomegaly or pericardial effusion. There is coronary vascular calcification. Similar aneurysmal dilatation of the ascending aorta measuring 4.5 cm in diameter. No aortic dissection. The central pulmonary arteries are unremarkable.   Mediastinum/Nodes: No hilar or mediastinal adenopathy. The esophagus is grossly unremarkable. No mediastinal fluid collection.   Lungs/Pleura: No focal consolidation, pleural effusion, pneumothorax. The central airways are patent.   Upper Abdomen: No acute abnormality.   Musculoskeletal: Degenerative changes of the spine. No acute osseous pathology.   Review of the MIP images confirms the above findings.   IMPRESSION: 1. No acute intrathoracic pathology. 2. Similar aneurysmal dilatation of the ascending aorta measuring 4.5 cm in diameter. Ascending thoracic aortic aneurysm. Recommend semi-annual imaging followup by CTA or MRA  and referral to cardiothoracic surgery if not already obtained. This recommendation follows 2010 ACCF/AHA/AATS/ACR/ASA/SCA/SCAI/SIR/STS/SVM Guidelines for the Diagnosis and Management of Patients With Thoracic Aortic Disease. Circulation. 2010; 121: Z733-z630. Aortic aneurysm NOS (ICD10-I71.9)     Electronically Signed   By: Vanetta Chou M.D.   On: 01/31/2024 16:12       A/P:  Aneurysm of ascending aorta without rupture -4.5 cm ascending thoracic aortic aneurysm on CTA of chest.  -We discussed the natural history and and risk factors for growth of ascending aortic aneurysms. Discussed recommendations to minimize the risk of further expansion or dissection including careful blood pressure control, avoidance of contact sports and heavy lifting, attention to lipid management.  We covered the importance of continued smoking cessation.  The patient does not yet meet surgical criteria of >5.5cm. The patient is aware of  signs and symptoms of aortic dissection and when to present to the emergency department     -Follow up in 6 months with CTA of chest for continued surveillance    Risk Modification:  Statin:  rosuvastatin   Smoking cessation instruction/counseling given:  commended patient for quitting and reviewed strategies for preventing relapses  Patient was counseled on importance of Blood Pressure Control  They are instructed to contact their Primary Care Physician if they start to have blood pressure readings over 130s/90s. Do not ever stop blood pressure medications on your own, unless instructed by healthcare professional.  Please avoid use of Fluoroquinolones as this can potentially increase your risk of Aortic Rupture and/or Dissection  Patient educated on signs and symptoms of Aortic Dissection, handout also provided in AVS  Manuelita CHRISTELLA Rough, PA-C 02/28/24

## 2024-02-28 NOTE — Patient Instructions (Signed)

## 2024-03-25 ENCOUNTER — Telehealth (HOSPITAL_BASED_OUTPATIENT_CLINIC_OR_DEPARTMENT_OTHER): Payer: Self-pay | Admitting: *Deleted

## 2024-03-25 NOTE — Telephone Encounter (Signed)
 Our office received a preop clearance request on 03/05/24. Our preop team has faxed back to requesting office x 2 for clarification. The procedure to be done is not listed, we will also need to know if any anesthesia being used.   Requests only states epidural. However, this could be an injection or a fusion etc. Unfortunately the preop team cannot proceed further until complete information has been provided.

## 2024-04-01 NOTE — Progress Notes (Signed)
 SUBJECTIVE   Patient ID: Raymond Allen is a 63 y.o. (DOB 07/05/1960) male  Chief Complaint  Patient presents with   Diabetes   Hypertension   Hyperlipidemia   Pain   Patient presents today for follow up and medication review.   Diabetes Presents in follow up of his diabetes. Patient's most recent labs include: POC A1C in April 2025 of 6.1.  Scheduled with endo next month.  Lab Results  Component Value Date   HGBA1C 7.2 (A) 04/19/2022  Home FSBS: BGs are running  consistent with Hgb A1C.  He has a Psychologist, Forensic.  Hypoglycemic episodes: No On ACE-I: Yes On statin therapy: Yes Patient reports no side effects to diabetic medications. Patient has no complaints of foot numbness, burning, loss of sensation or sores.  Hypertension/Chronic heart failure Patient presents for hypertension follow up. Current antihypertensive medications reviewed. Tolerating medication well. ROS: No chest pain, shortness of breath, palpitations or worsening edema. Following with cardiology.  BP Readings from Last 3 Encounters:  04/01/24 128/88  01/11/24 144/83  10/02/23 110/86   Hyperlipidemia Presents in follow up of  cholesterol. Current lipid lowering medications reviewed. Tolerating medication well. No side effects. No myalgias. Lipid panel is current. Lab Results  Component Value Date   CHOL 175 03/20/2023   CHOL 143 05/24/2022   CHOL 184 02/24/2022   Lab Results  Component Value Date   HDL 53 (L) 03/20/2023   HDL 52 (L) 05/24/2022   HDL 45 (L) 02/24/2022   Lab Results  Component Value Date   LDLCALC 100 (H) 03/20/2023   Lab Results  Component Value Date   TRIG 120 03/20/2023   TRIG 145 05/24/2022   TRIG 91 02/24/2022   Pain Following with pain management.  Pain is currently not great.  His right hip is bone on bone and he's following with [facility].  He sees them next week.    Carey's on xarelto  for chronic saddle PE.    The following portions of the patient's history were  reviewed and updated as appropriate: allergies, current medications, past family history, past medical history, past social history, past surgical history and problem list.  Most recent lab results reviewed and discussed with patient.  REVIEW OF SYSTEMS: Reviewed and negative other than those pertinent positives noted in the HPI.  OBJECTIVE   Vitals:   04/01/24 1504  BP: 128/88  Pulse: 89  SpO2: 95%  Vitals: BP: 128/88, Heart Rate: 89, SpO2: 95 %, Height: 1.854 m (6' 1), Weight: 111 kg (244 lb); BMI (Calculated): 32.2 Constitutional: Oriented to person, place and time. Appears well-developed and well nourished. HEENT: Normocephalic, atraumatic. Neck: No JVD Cardiovascular: Normal rate and rhythm.  Exam reveals no gallop and no friction rub.  No murmur heard.  Pulmonary/chest: Effort normal and breath sounds normal. No wheezes. No rales.  Skin: Warm and dry. No rashes noted. Patient is not diaphoretic. Extremities: No edema bilaterally.  Psychiatric: Normal mood and affect. Behavior normal. Judgement and thought content normal.   ASSESSMENT/PLAN   1. Chronic heart failure, unspecified heart failure type (CMD) (Primary) Stable and well controlled on current regimen.  2. Chronic pain syndrome Management per pain management.   3. 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  Doing well without episodes of bleeding.   4. 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  5. Essential (primary) hypertension Stable and well controlled on current regimen.  6. Hyperlipidemia, mixed Stable and well controlled on current regimen.  Follow up 6 mo for MWV  Encounter Medications[1] Risks, benefits, and alternatives of the medication(s) and treatment plan(s) were discussed, and he expressed understanding. Plan follow up as discussed or as needed. No barriers to treatment identified in this  visit.    Electronically signed by: Julie Marie Jordan, NP 04/01/2024 3:09 PM       [1] Outpatient Encounter Medications as of 04/01/2024  Medication Sig Dispense Refill   amiodarone  (PACERONE ) 200 mg tablet Take 200 mg by mouth daily.     amitriptyline (ELAVIL) 100 mg tablet Take 1 tablet (100 mg total) by mouth in the morning and 1 tablet (100 mg total) at noon and 1 tablet (100 mg total) before bedtime. Note: Anticipate fatigue until medicine accomodates in body ( 1 week).. 180 tablet 1   ammonium lactate 12 % crea APPLY TO FEET DAILY AS NEEDED 140 g 1   blood-glucose sensor (FreeStyle Libre 3 Plus Sensor) 1 each by miscellaneous route every 15 days. 6 each 4   carvediloL  (COREG ) 3.125 mg tablet Take 3.125 mg by mouth in the morning and 3.125 mg in the evening. Take with meals.     cyclobenzaprine  (FLEXERIL ) 10 mg tablet Take 1 tablet (10 mg total) by mouth 3 (three) times a day as needed for muscle spasms. 30 tablet 0   digoxin  (LANOXIN ) 125 mcg (0.125 mg) tablet Take 125 mcg by mouth daily.     empagliflozin  (Jardiance ) 10 mg tab Take 1 tablet (10 mg total) by mouth daily. 90 tablet 2   Entresto  49-51 mg per tablet Take 1 tablet by mouth 2 (two) times a day.     furosemide  (LASIX ) 40 mg tablet Take 20 mg by mouth daily. TAKING 1/2 TABLET DAILY (Patient taking differently: Take 40 mg by mouth daily.)     hydrALAZINE  (APRESOLINE ) 50 mg tablet Take 50 mg by mouth every 8 (eight) hours.     insulin  degludec Lelon FlexTouch U-200) 200 unit/mL (3 mL) pen INJECT 25 UNITS UNDER THE SKIN DAILY. 9 mL 4   metFORMIN (GLUCOPHAGE-XR) 500 mg 24 hr tablet Take 1 tablet (500 mg total) by mouth 2 (two) times a day. 180 tablet 1   oxyCODONE -acetaminophen  (PERCOCET) 7.5-325 mg per tablet Take 1 tablet by mouth every 8 (eight) hours as needed for severe pain (7-10) or moderate pain (4-6). TAKING 1 TABLET UP TO 5 TIMES DAILY     pen needle, diabetic (BD Nano 2nd Gen Pen Needle) 32 gauge x  5/32 ndle USE TO ADMINISTER INSULIN  ONCE DAILY 100 each 1   polyethylene glycol (GLYCOLAX ) 17 gram packet Take 17 g by mouth daily.     potassium chloride  (KLOR-CON ) 20 mEq ER tablet Take 20 mEq by mouth Once Daily.     rivaroxaban  (Xarelto ) 20 mg tablet Take 1 tablet (20 mg total) by mouth daily. 90 tablet 3   rosuvastatin  (CRESTOR ) 20 mg tablet TAKE 1 TABLET BY MOUTH EVERY DAY 90 tablet 0   semaglutide (Ozempic) 2 mg/dose (8 mg/3 mL) subcutaneous pen injector Inject 0.75 mL (2 mg total) under the skin every 7 days. 3 mL 5   spironolactone  (ALDACTONE ) 25 mg tablet Take 25 mg by mouth Once Daily.     No facility-administered encounter medications on file as of 04/01/2024.

## 2024-04-02 ENCOUNTER — Telehealth (HOSPITAL_COMMUNITY): Payer: Self-pay | Admitting: Cardiology

## 2024-04-02 NOTE — Telephone Encounter (Signed)
 Clearance returned to office via epic routing

## 2024-04-02 NOTE — Telephone Encounter (Signed)
°  [facility]   Pre-operative Risk Assessment   HEARTCARE STAFF-IMPORTANT INSTRUCTIONS 1 Red and Blue Text will auto delete once note is signed or closed. 2 Press F2 to navigate through template.   3 On drop down lists, L click to select >> R click to activate next field 4 Reason for Visit format is IMPORTANT!!  See Directions on No. 2 below. 5 Please review chart to determine if there is already a clearance note open for this procedure!!  DO NOT duplicate if a note already exists!!    :1}      Request for Surgical Clearance    Procedure:  cervical fusion  Date of Surgery:  Clearance TBD                                 Surgeon:  Dr Mavis Socks Group or Practice Name:  [facility]  Phone number:  [phone] Fax number:  [phone] or [phone]   Type of Clearance Requested:   - Medical  - Pharmacy:  Hold Rivaroxaban  (Xarelto )     Type of Anesthesia:  General    Additional requests/questions:    Bonney Jerona Dalton CHRISTELLA   04/02/2024, 3:14 PM   [facility] Toribio Cherrie COME  (enter provider) [facility] [address] [facility] [place] [address] [phone] (office) [phone] (fax)

## 2024-04-03 ENCOUNTER — Telehealth (HOSPITAL_COMMUNITY): Payer: Self-pay | Admitting: Cardiology

## 2024-04-03 NOTE — Telephone Encounter (Signed)
 Printed and faxed to number on clearance

## 2024-04-03 NOTE — Telephone Encounter (Signed)
°  [facility] FAIL[facility]   Pre-operative Risk Assessment   HEARTCARE STAFF-IMPORTANT INSTRUCTIONS 1 Red and Blue Text will auto delete once note is signed or closed. 2 Press F2 to navigate through template.   3 On drop down lists, L click to select >> R click to activate next field 4 Reason for Visit format is IMPORTANT!!  See Directions on No. 2 below. 5 Please review chart to determine if there is already a clearance note open for this procedure!!  DO NOT duplicate if a note already exists!!    :1}     Request for Surgical Clearance    Procedure:  spinal cord stimulator   Date of Surgery:  Clearance TBD                                 Surgeon:  Dr Armida Socks Group or Practice Name:  [facility] Phone number:  [phone] Fax number:  [phone]   Type of Clearance Requested:   - Medical  - Pharmacy:  Hold Rivaroxaban  (Xarelto )     Type of Anesthesia:  Not Indicated   Additional requests/questions:  n/a  Raymond Allen   04/03/2024, 3:03 PM   [facility] Raymond Allen COME  (enter provider) [facility] [address] [facility] [place] [address] [phone] (office) [phone] (fax)

## 2024-04-14 NOTE — Telephone Encounter (Signed)
 [facility] called back to get specific instructions r/t Xarelto . They request patient hold Xarelto  for total of 10 days (3 days prior and 7 days after procedure) and need approval for this.  Sent to provider for review/recommendations.

## 2024-04-14 NOTE — Telephone Encounter (Signed)
 Request faxed back to [facility] at [phone]

## 2024-04-17 ENCOUNTER — Other Ambulatory Visit (HOSPITAL_COMMUNITY): Payer: Self-pay | Admitting: Internal Medicine
